# Patient Record
Sex: Male | Born: 1935 | Race: Black or African American | Hispanic: No | Smoking: Former smoker
Health system: Southern US, Community
[De-identification: ages and names within clinical notes are randomized; demographics above are authoritative.]

## PROBLEM LIST (undated history)

## (undated) DIAGNOSIS — I5042 Chronic combined systolic (congestive) and diastolic (congestive) heart failure: Secondary | ICD-10-CM

## (undated) DIAGNOSIS — Z9289 Personal history of other medical treatment: Secondary | ICD-10-CM

## (undated) DIAGNOSIS — M109 Gout, unspecified: Secondary | ICD-10-CM

## (undated) DIAGNOSIS — F0390 Unspecified dementia without behavioral disturbance: Secondary | ICD-10-CM

## (undated) DIAGNOSIS — D471 Chronic myeloproliferative disease: Secondary | ICD-10-CM

## (undated) DIAGNOSIS — D759 Disease of blood and blood-forming organs, unspecified: Secondary | ICD-10-CM

## (undated) DIAGNOSIS — J449 Chronic obstructive pulmonary disease, unspecified: Secondary | ICD-10-CM

## (undated) DIAGNOSIS — E039 Hypothyroidism, unspecified: Secondary | ICD-10-CM

## (undated) DIAGNOSIS — I4892 Unspecified atrial flutter: Secondary | ICD-10-CM

## (undated) DIAGNOSIS — I639 Cerebral infarction, unspecified: Secondary | ICD-10-CM

## (undated) DIAGNOSIS — N400 Enlarged prostate without lower urinary tract symptoms: Secondary | ICD-10-CM

## (undated) DIAGNOSIS — D649 Anemia, unspecified: Secondary | ICD-10-CM

## (undated) DIAGNOSIS — Z9989 Dependence on other enabling machines and devices: Secondary | ICD-10-CM

## (undated) DIAGNOSIS — I10 Essential (primary) hypertension: Secondary | ICD-10-CM

## (undated) DIAGNOSIS — M1990 Unspecified osteoarthritis, unspecified site: Secondary | ICD-10-CM

## (undated) DIAGNOSIS — E119 Type 2 diabetes mellitus without complications: Secondary | ICD-10-CM

## (undated) HISTORY — DX: Gout, unspecified: M10.9

## (undated) HISTORY — PX: THYROID SURGERY: SHX805

## (undated) HISTORY — PX: APPENDECTOMY: SHX54

---

## 2001-09-01 ENCOUNTER — Ambulatory Visit (HOSPITAL_COMMUNITY): Admission: RE | Admit: 2001-09-01 | Discharge: 2001-09-01 | Payer: Self-pay | Admitting: Internal Medicine

## 2001-09-01 ENCOUNTER — Encounter: Payer: Self-pay | Admitting: Internal Medicine

## 2004-11-29 ENCOUNTER — Observation Stay (HOSPITAL_COMMUNITY): Admission: EM | Admit: 2004-11-29 | Discharge: 2004-12-01 | Payer: Self-pay | Admitting: Emergency Medicine

## 2006-10-28 ENCOUNTER — Ambulatory Visit (HOSPITAL_COMMUNITY): Admission: RE | Admit: 2006-10-28 | Discharge: 2006-10-28 | Payer: Self-pay | Admitting: Internal Medicine

## 2006-12-24 ENCOUNTER — Encounter: Admission: RE | Admit: 2006-12-24 | Discharge: 2006-12-24 | Payer: Self-pay | Admitting: General Surgery

## 2006-12-27 ENCOUNTER — Ambulatory Visit (HOSPITAL_BASED_OUTPATIENT_CLINIC_OR_DEPARTMENT_OTHER): Admission: RE | Admit: 2006-12-27 | Discharge: 2006-12-27 | Payer: Self-pay | Admitting: General Surgery

## 2007-11-20 HISTORY — PX: CYST REMOVAL NECK: SHX6281

## 2012-02-01 ENCOUNTER — Telehealth: Payer: Self-pay | Admitting: Oncology

## 2012-02-01 NOTE — Telephone Encounter (Signed)
S/w pt re appt for 3/20 @ 1:30 pm. Pt given appt d/t/location/phone#.

## 2012-02-04 ENCOUNTER — Telehealth: Payer: Self-pay | Admitting: Oncology

## 2012-02-04 NOTE — Telephone Encounter (Signed)
Referred by Dr. Margaretmary Bayley Dx- Dec WBC, Anemia

## 2012-02-05 ENCOUNTER — Other Ambulatory Visit: Payer: Self-pay | Admitting: Oncology

## 2012-02-05 DIAGNOSIS — D649 Anemia, unspecified: Secondary | ICD-10-CM

## 2012-02-06 ENCOUNTER — Other Ambulatory Visit (HOSPITAL_BASED_OUTPATIENT_CLINIC_OR_DEPARTMENT_OTHER): Payer: Medicare Other

## 2012-02-06 ENCOUNTER — Telehealth: Payer: Self-pay | Admitting: Oncology

## 2012-02-06 ENCOUNTER — Ambulatory Visit (HOSPITAL_BASED_OUTPATIENT_CLINIC_OR_DEPARTMENT_OTHER): Payer: Medicare Other | Admitting: Oncology

## 2012-02-06 ENCOUNTER — Ambulatory Visit: Payer: Self-pay

## 2012-02-06 ENCOUNTER — Encounter (HOSPITAL_COMMUNITY)
Admission: RE | Admit: 2012-02-06 | Discharge: 2012-02-06 | Disposition: A | Discharge status: Home / Self Care | Payer: Medicare Other | Source: Ambulatory Visit | Attending: Oncology | Admitting: Oncology

## 2012-02-06 ENCOUNTER — Ambulatory Visit: Payer: Medicare Other

## 2012-02-06 ENCOUNTER — Encounter: Payer: Self-pay | Admitting: Oncology

## 2012-02-06 VITALS — BP 114/60 | HR 98 | Temp 98.0°F | Ht 67.5 in | Wt 186.1 lb

## 2012-02-06 DIAGNOSIS — D649 Anemia, unspecified: Secondary | ICD-10-CM | POA: Insufficient documentation

## 2012-02-06 DIAGNOSIS — D709 Neutropenia, unspecified: Secondary | ICD-10-CM

## 2012-02-06 LAB — MANUAL DIFFERENTIAL
Basophil: 1 % (ref 0–2)
EOS: 0 % (ref 0–7)
PLT EST: ADEQUATE
Variant Lymph: 0 % (ref 0–0)
nRBC: 2 % — ABNORMAL HIGH (ref 0–0)

## 2012-02-06 LAB — COMPREHENSIVE METABOLIC PANEL
ALT: <8 U/L (ref 0–53)
AST: 11 U/L (ref 0–37)
Albumin: 3.9 g/dL (ref 3.5–5.2)
BUN: 13 mg/dL (ref 6–23)
CO2: 22 mEq/L (ref 19–32)
Sodium: 138 mEq/L (ref 135–145)
Total Bilirubin: 0.4 mg/dL (ref 0.3–1.2)
Total Protein: 6.4 g/dL (ref 6.0–8.3)

## 2012-02-06 LAB — IRON AND TIBC: %SAT: 31 % (ref 20–55)

## 2012-02-06 LAB — CBC WITH DIFFERENTIAL/PLATELET
HCT: 20.2 % — ABNORMAL LOW (ref 38.4–49.9)
Platelets: 168 10*3/uL (ref 140–400)

## 2012-02-06 NOTE — Telephone Encounter (Signed)
Per carrie in sch they were able to get him on for 3/25 and she was to call the pt,email to   aom

## 2012-02-06 NOTE — Telephone Encounter (Signed)
appts made and printed and note to dr fs that bm bx may not be until 3/29,advised the pt they would be called by rad. With the appt info    aom

## 2012-02-06 NOTE — Progress Notes (Signed)
Note dictated

## 2012-02-06 NOTE — Progress Notes (Signed)
Patient came in today as a new patient,they have [employer] Medicare insurance,so I did offer him financial assistance his daughter said she would fill it out and return it to me.

## 2012-02-06 NOTE — Progress Notes (Signed)
CC:   Margaretmary Bayley, M.D.  REASON FOR CONSULTATION:  Anemia.  HISTORY OF PRESENT ILLNESS:  Kyle Heath is a pleasant gentleman with past medical history significant for hypertension and hypothyroidism, mild diabetes, but otherwise in relatively good health and shape.  He gets his routine medical care with Dr. Margaretmary Bayley.  He has been doing relatively well.  He has noticed some slight fatigue in recent months and on January 02, 2012 he had a CBC which showed a white cell count was low as 2.2, his hemoglobin was 7.6, and his platelet count was 146. He had iron studies at that time that showed that the iron level was 100 and iron saturation was 56.  His B12 level was 178.  His folate was 9.4. His TSH was 5.7.  A Repeat CBC a month later after he was treated with B12 injection showed his hemoglobin down at 7.2, white cell count of 2.1, and platelet count of 208.  His differential was abnormal with immature cells possibly at 5%.  His absolute neutrophil count was 900. He had nucleated red cells noted on the smear.  He also had normal chemistries including calcium and vitamin B12 and folate were within normal range.  He did have a PSA high at 10.8.  For that reason, the patient referred to me for evaluation.  Upon interviewing Kyle Heath, he is relatively asymptomatic.  He does report some fatigue.  He did report about a 10-pound weight loss.  He is not reporting any back pain.  He is not reporting any shoulder pains.  He is not reporting any hip pain.  He had not had any decline in his performance status.  He does not have any urinary symptoms.  He does not report any hematuria or any dysuria.  His appetite has been reasonable but, as mentioned, he did have about a 10- pound weight loss.  REVIEW OF SYSTEMS:  He does not report any headaches, blurry vision, double vision.  He does not report any motor or sensory neuropathy.  He does not report any alteration in mental status.  He does not  report any psychiatric issues, depression.  He does not report any fever, chills, sweats.  He does not report any cough, hemoptysis, hematemesis.  No nausea, vomiting.  He does not report any abdominal pain.  No hematochezia, melena.  No genitourinary complaints.  Rest of review of systems unremarkable.  PAST MEDICAL HISTORY:  Significant for hypertension, hypothyroidism, and diabetes.  He does not have any history of coronary artery disease.  He does not have any history of any liver disease or any malignancies.  MEDICATION:  He is on amlodipine, aspirin, Synthroid, metformin with linagliptin combination or Jentadueto 2.03/999.  He is on losartan.  ALLERGIES:  None.  SOCIAL HISTORY:  He is single.  He lives with his daughter.  He denied any alcohol abuse.  He smokes cigars and chews tobacco and has done that for many years.  FAMILY HISTORY:  Really unremarkable for any blood disorder.  His father died from a complication of cancer although it is unclear what kind.  PHYSICAL EXAMINATION:  General:  Alert, awake gentleman, appeared really in no active distress.  Vital signs:  His blood pressure 114/60, pulse 98, respirations 20, temperature is 98.  HEENT:  Head is normocephalic, atraumatic.  Pupils equal, round, reactive to light.  Oral mucosa moist and pink.  Neck:  Supple without adenopathy.  Heart:  Regular rate and rhythm.  S1, S2.  Lungs:  Clear auscultation without rhonchi, wheeze, dullness to percussion.  Abdomen:  Soft, nontender.  No hepatosplenomegaly.  Extremities:  No clubbing, cyanosis, or edema. Neurological:  Intact motor, sensory and deep tendon reflexes.  LABORATORY DATA:  Showed a hemoglobin of 6.1, white cell count of 2.0, platelet count 168.  Peripheral smear really failed to show any immature or blastic-looking cells.  There are very few enlarged hypo-lobated neutrophils.  The platelets appear adequate, slightly increased in size. Red cells were decreased in  number and there was abundant polychromasia, nucleated red cells, as well as tear drop cells.  ASSESSMENT AND PLAN:  This is a pleasant gentleman with rather profound anemia and neutropenia with differential showing a few immature cells, possibly blasts, many teardrop cells and ovalocytes suspicious of an infiltrative bone marrow process.  The differential diagnosis today discussed with Kyle Heath includes a primary bone marrow disorder such as leukemia, myelodysplastic syndrome, lymphoproliferative process involving the bone marrow, as well as a possible myeloproliferative disorder such as myelofibrosis or a hairy cell leukemia are definitely a distinct possibility but also he had a slightly elevated PSA.  He could have a carcinoma such as prostate carcinoma metastatic into the bone marrow that could have a similar picture.  Clearly Kyle Heath will need extensive evaluation.  I think given the nature of his grossly abnormal peripheral smear, I  will proceed with a bone marrow biopsy.  Risks and benefits were discussed today in detail including complications of bleeding and pain as well as possible infection.  Given Kyle Heath size and body habitus, prefer to be done under CT guidance.  We will set that up for [facility] as soon as possible.  I would like specimen cell sent for flow cytometry and cytogenetics to rule out MDS as well as leukemia.  Other possibilities on the differential would be vitamin deficiency.  He has been worked up extensively for vitamin B12 and folate deficiency and I think it is less likely.  His MCV is normal. Again he has been adequately treated for it as well.  In terms of management of his anemia, I think for the immediate future I will set him up for 2 units of packed red cell transfusion to prevent any symptomatic progression of his anemia,  progression of shortness of breath or chest pain.  I warned him about that, so we will set that up here in  the near future.  In terms of workup for a solid tumor malignancy, I will defer that until I get the results of the bone marrow.  Imaging studies including CT scan, bone scan, will certainly be a possibility if we suspect that this carcinoma rather than a primary bone marrow disorder.    ______________________________ Benjiman Core, M.D. FNS/MEDQ  D:  02/06/2012  T:  02/06/2012  Job:  [id number]

## 2012-02-07 ENCOUNTER — Encounter (HOSPITAL_COMMUNITY): Payer: Self-pay | Admitting: Pharmacy Technician

## 2012-02-08 ENCOUNTER — Other Ambulatory Visit: Payer: Self-pay | Admitting: Radiology

## 2012-02-09 ENCOUNTER — Ambulatory Visit (HOSPITAL_BASED_OUTPATIENT_CLINIC_OR_DEPARTMENT_OTHER): Payer: Medicare Other

## 2012-02-09 VITALS — BP 145/61 | HR 74 | Temp 98.0°F | Resp 18

## 2012-02-09 DIAGNOSIS — D649 Anemia, unspecified: Secondary | ICD-10-CM

## 2012-02-09 MED ORDER — SODIUM CHLORIDE 0.9 % IJ SOLN
10.0000 mL | INTRAMUSCULAR | Status: DC | PRN
  Filled 2012-02-09: qty 10

## 2012-02-09 MED ORDER — DIPHENHYDRAMINE HCL 25 MG PO CAPS
25.0000 mg | ORAL_CAPSULE | Freq: Once | ORAL | Status: AC
  Administered 2012-02-09: 25 mg via ORAL

## 2012-02-09 MED ORDER — SODIUM CHLORIDE 0.9 % IV SOLN
250.0000 mL | Freq: Once | INTRAVENOUS | Status: AC
  Administered 2012-02-09: 250 mL via INTRAVENOUS

## 2012-02-09 MED ORDER — ACETAMINOPHEN 325 MG PO TABS
650.0000 mg | ORAL_TABLET | Freq: Once | ORAL | Status: AC
  Administered 2012-02-09: 650 mg via ORAL

## 2012-02-09 MED ORDER — HEPARIN SOD (PORK) LOCK FLUSH 100 UNIT/ML IV SOLN
250.0000 [IU] | INTRAVENOUS | Status: DC | PRN
  Filled 2012-02-09: qty 5

## 2012-02-09 MED ORDER — HEPARIN SOD (PORK) LOCK FLUSH 100 UNIT/ML IV SOLN
500.0000 [IU] | INTRAVENOUS | Status: DC | PRN
  Filled 2012-02-09: qty 5

## 2012-02-09 NOTE — Patient Instructions (Signed)
Blood Transfusion Information  WHAT IS A BLOOD TRANSFUSION?  A transfusion is the replacement of blood or some of its parts. Blood is made up of multiple cells which provide different functions.   Red blood cells carry oxygen and are used for blood loss replacement.   White blood cells fight against infection.   Platelets control bleeding.   Plasma helps clot blood.   Other blood products are available for specialized needs, such as hemophilia or other clotting disorders.  BEFORE THE TRANSFUSION   Who gives blood for transfusions?    You may be able to donate blood to be used at a later date on yourself (autologous donation).   Relatives can be asked to donate blood. This is generally not any safer than if you have received blood from a stranger. The same precautions are taken to ensure safety when a relative's blood is donated.   Healthy volunteers who are fully evaluated to make sure their blood is safe. This is blood bank blood.  Transfusion therapy is the safest it has ever been in the practice of medicine. Before blood is taken from a donor, a complete history is taken to make sure that person has no history of diseases nor engages in risky social behavior (examples are intravenous drug use or sexual activity with multiple partners). The donor's travel history is screened to minimize risk of transmitting infections, such as malaria. The donated blood is tested for signs of infectious diseases, such as HIV and hepatitis. The blood is then tested to be sure it is compatible with you in order to minimize the chance of a transfusion reaction. If you or a relative donates blood, this is often done in anticipation of surgery and is not appropriate for emergency situations. It takes many days to process the donated blood.  RISKS AND COMPLICATIONS  Although transfusion therapy is very safe and saves many lives, the main dangers of transfusion include:    Getting an infectious disease.   Developing a  transfusion reaction. This is an allergic reaction to something in the blood you were given. Every precaution is taken to prevent this.  The decision to have a blood transfusion has been considered carefully by your caregiver before blood is given. Blood is not given unless the benefits outweigh the risks.  AFTER THE TRANSFUSION   Right after receiving a blood transfusion, you will usually feel much better and more energetic. This is especially true if your red blood cells have gotten low (anemic). The transfusion raises the level of the red blood cells which carry oxygen, and this usually causes an energy increase.   The nurse administering the transfusion will monitor you carefully for complications.  HOME CARE INSTRUCTIONS   No special instructions are needed after a transfusion. You may find your energy is better. Speak with your caregiver about any limitations on activity for underlying diseases you may have.  SEEK MEDICAL CARE IF:    Your condition is not improving after your transfusion.   You develop redness or irritation at the intravenous (IV) site.  SEEK IMMEDIATE MEDICAL CARE IF:   Any of the following symptoms occur over the next 12 hours:   Shaking chills.   You have a temperature by mouth above 102 F (38.9 C), not controlled by medicine.   Chest, back, or muscle pain.   People around you feel you are not acting correctly or are confused.   Shortness of breath or difficulty breathing.   Dizziness and fainting.     in urine output.   Your urine turns a dark color or changes to pink, red, or brown.  Any of the following symptoms occur over the next 10 days:  You have a temperature by mouth above 102 F (38.9 C), not controlled by medicine.   Shortness of breath.   Weakness after normal activity.   The white part of the eye turns yellow (jaundice).   You have a decrease in the amount of urine or are urinating less  often.   Your urine turns a dark color or changes to pink, red, or brown.  Document Released: 11/02/2000 Document Revised: 10/25/2011 Document Reviewed: 06/21/2008 [facility] Patient Information 2012 [place], [place].    Patient given a thermometer.  Instructed to go to ER if fever shaking and chills, otherwise take tylenol.  Call [phone] for any emergencies after hours and on weekends.  Discharged at 1354 to home, ambulatory with daughter, Grey Rakestraw, his girlfriend and three other family members who've taken turns visiting him in the infusion area today.  Kimberlyann Hollar [place]rui[place] RN

## 2012-02-09 NOTE — Progress Notes (Signed)
Patient arrived for blood transfusion.  Wearing blue blood bank bracelet.  Called blood bank and two units packed cells are ready for pick up.  Called on call physician for orders.  Pt. being followed for anemia.  Hgb = 6.1 on 02-06-12.  Orders received and read back from Dr. Gaylyn Rong to transfuse two units.

## 2012-02-10 LAB — TYPE AND SCREEN
Unit division: 0
Unit division: 0

## 2012-02-11 ENCOUNTER — Ambulatory Visit (HOSPITAL_COMMUNITY)
Admission: RE | Admit: 2012-02-11 | Discharge: 2012-02-11 | Disposition: A | Discharge status: Home / Self Care | Payer: Medicare Other | Source: Ambulatory Visit | Attending: Oncology | Admitting: Oncology

## 2012-02-11 ENCOUNTER — Other Ambulatory Visit: Payer: Self-pay | Admitting: Radiology

## 2012-02-11 ENCOUNTER — Encounter (HOSPITAL_COMMUNITY): Payer: Self-pay

## 2012-02-11 DIAGNOSIS — Z79899 Other long term (current) drug therapy: Secondary | ICD-10-CM | POA: Insufficient documentation

## 2012-02-11 DIAGNOSIS — E119 Type 2 diabetes mellitus without complications: Secondary | ICD-10-CM | POA: Insufficient documentation

## 2012-02-11 DIAGNOSIS — Z7982 Long term (current) use of aspirin: Secondary | ICD-10-CM | POA: Insufficient documentation

## 2012-02-11 DIAGNOSIS — D649 Anemia, unspecified: Secondary | ICD-10-CM | POA: Insufficient documentation

## 2012-02-11 DIAGNOSIS — E039 Hypothyroidism, unspecified: Secondary | ICD-10-CM | POA: Insufficient documentation

## 2012-02-11 DIAGNOSIS — I10 Essential (primary) hypertension: Secondary | ICD-10-CM | POA: Insufficient documentation

## 2012-02-11 DIAGNOSIS — D709 Neutropenia, unspecified: Secondary | ICD-10-CM | POA: Insufficient documentation

## 2012-02-11 HISTORY — DX: Hypothyroidism, unspecified: E03.9

## 2012-02-11 HISTORY — DX: Anemia, unspecified: D64.9

## 2012-02-11 LAB — CBC
HCT: 25.5 % — ABNORMAL LOW (ref 39.0–52.0)
MCH: 29 pg (ref 26.0–34.0)
MCHC: 32.2 g/dL (ref 30.0–36.0)
MCV: 90.1 fL (ref 78.0–100.0)
RDW: 20.7 % — ABNORMAL HIGH (ref 11.5–15.5)

## 2012-02-11 MED ORDER — MIDAZOLAM HCL 2 MG/2ML IJ SOLN
INTRAMUSCULAR | Status: AC
  Filled 2012-02-11: qty 2

## 2012-02-11 MED ORDER — MIDAZOLAM HCL 5 MG/5ML IJ SOLN
INTRAMUSCULAR | Status: AC | PRN
  Administered 2012-02-11 (×2): 1 mg via INTRAVENOUS

## 2012-02-11 MED ORDER — FENTANYL CITRATE 0.05 MG/ML IJ SOLN
INTRAMUSCULAR | Status: AC
  Filled 2012-02-11: qty 6

## 2012-02-11 MED ORDER — SODIUM CHLORIDE 0.9 % IV SOLN
Freq: Once | INTRAVENOUS | Status: AC
  Administered 2012-02-11: 500 mL via INTRAVENOUS

## 2012-02-11 MED ORDER — FENTANYL CITRATE 0.05 MG/ML IJ SOLN
INTRAMUSCULAR | Status: AC | PRN
  Administered 2012-02-11 (×2): 50 ug via INTRAVENOUS

## 2012-02-11 MED ORDER — MIDAZOLAM HCL 2 MG/2ML IJ SOLN
INTRAMUSCULAR | Status: AC
  Filled 2012-02-11: qty 4

## 2012-02-11 NOTE — Discharge Instructions (Signed)
Biopsy A biopsy is a procedure in which small samples of tissue are removed from the body. The tissue is examined under a microscope. A biopsy may be done to determine the cause (diagnosis) of a condition or mass (tumor). A biopsy may also be done to determine the best treatment for you. In some instances, a biopsy may be performed on normal tissue to determine if cancer has spread or if a transplanted organ is being rejected. There are 2 ways to obtain samples:  Fine needle biopsy. Samples are removed using a thin needle inserted through the skin.   Open biopsy. Samples are removed after a cut (incision) is made through the skin.  LET YOUR CAREGIVER KNOW ABOUT:  Allergies to food or medicine.   Medicines taken, including vitamins, herbs, eyedrops, over-the-counter medicines, and creams.   Use of steroids (by mouth or creams).   Previous problems with anesthetics or numbing medicines.   History of bleeding problems or blood clots.   Previous surgery.   Other health problems, including diabetes and kidney problems.   Possibility of pregnancy, if this applies.  RISKS AND COMPLICATIONS  Bleeding from the biopsy site. The risk of bleeding is higher if you have a bleeding disorder or are taking any blood thinning medicines (anticoagulants).   Infection.   Injury to organs or structures near the biopsy site.   Chronic pain at the biopsy site. This is defined as pain that lasts for more than 3 months.   Very rarely, a second biopsy may be required if not enough tissue was collected during the first biopsy.  BEFORE THE PROCEDURE Ask your caregiver what time you need to arrive for your procedure. Ask your caregiver whether you need to stop eating or drinking (fast) before your procedure. Ask your caregiver about changing or stopping your regular medicines. A blood sample may be done to determine your blood clotting time. Medicine may be given to help you relax (sedative). PROCEDURE During  a fine needle biopsy, you will be awake during the procedure. You will be positioned to allow the best possible access to the biopsy site. Let your caregiver know if the position is not comfortable. The biopsy site will be cleaned. A needle is inserted through your skin. You may feel mild discomfort during this procedure. The needle is withdrawn once tissue samples have been removed. Pressure may be applied to the biopsy site to reduce swelling and to ensure that bleeding has stopped. The samples will be sent to be examined. During an open biopsy, you may be given medicine that numbs the area (local anesthetic) or medicine that makes you sleep (general anesthetic). An incision is made through the skin. A tissue sample or the entire mass is removed. The sample or mass will be sent to be examined. Sometimes, the sample or mass may be examined during the procedure. If the sample or mass contains cancer cells, further tissue or structures may be removed. The incision is then closed with stitches (sutures) or skin glue (adhesive). AFTER THE PROCEDURE Your recovery will be assessed and monitored. If there are no problems, you should be able to go home shortly after the procedure (outpatient). You will need to arrange for someone to drive you home if you received a sedative or pain relieving medicine during the procedure. Ask when your test results will be ready. Make sure you get your test results. Document Released: 11/02/2000 Document Revised: 10/25/2011 Document Reviewed: 05/03/2011 [employer] Patient Information 2012 [employer]. 

## 2012-02-11 NOTE — H&P (Signed)
Agree.  For CT guided bone marrow biopsy today.  

## 2012-02-11 NOTE — Procedures (Signed)
Procedure:  CT guided bone marrow biopsy Findings:  Right iliac BM aspirate and core biopsy via 11 G needle.

## 2012-02-11 NOTE — H&P (Signed)
Kyle Heath is an 78 y.o. male.   Chief Complaint: Anemia, neutropenia  HPI: Fatigue, no bone pain.  Seen by Dr. Clelia Croft in regards to hematological work up for diagnosis. Request has been made for bone marrow needle core biopsy. See his note below :   Benjiman Core, MD Physician Signed  Progress Notes 02/06/2012 4:20 PM  Related encounter: Office Visit from 02/06/2012 in [facility]  CC:   Margaretmary Bayley, M.D.   REASON FOR CONSULTATION:  Anemia.   HISTORY OF PRESENT ILLNESS:  Kyle Heath is a pleasant gentleman with past medical history significant for hypertension and hypothyroidism, mild diabetes, but otherwise in relatively good health and shape.  He gets his routine medical care with Dr. Margaretmary Bayley.  He has been doing relatively well.  He has noticed some slight fatigue in recent months and on January 02, 2012 he had a CBC which showed a white cell count was low as 2.2, his hemoglobin was 7.6, and his platelet count was 146. He had iron studies at that time that showed that the iron level was 100 and iron saturation was 56.  His B12 level was 178.  His folate was 9.4. His TSH was 5.7.  A Repeat CBC a month later after he was treated with B12 injection showed his hemoglobin down at 7.2, white cell count of 2.1, and platelet count of 208.  His differential was abnormal with immature cells possibly at 5%.  His absolute neutrophil count was 900. He had nucleated red cells noted on the smear.  He also had normal chemistries including calcium and vitamin B12 and folate were within normal range.  He did have a PSA high at 10.8.  For that reason, the patient referred to me for evaluation.  Upon interviewing Kyle Heath, he is relatively asymptomatic.  He does report some fatigue.  He did report about a 10-pound weight loss.  He is not reporting any back pain.  He is not reporting any shoulder pains.  He is not reporting any hip pain.  He had not had any decline  in his performance status.  He does not have any urinary symptoms.  He does not report any hematuria or any dysuria.  His appetite has been reasonable but, as mentioned, he did have about a 10- pound weight loss.   REVIEW OF SYSTEMS:  He does not report any headaches, blurry vision, double vision.  He does not report any motor or sensory neuropathy.  He does not report any alteration in mental status.  He does not report any psychiatric issues, depression.  He does not report any fever, chills, sweats.  He does not report any cough, hemoptysis, hematemesis.  No nausea, vomiting.  He does not report any abdominal pain.  No hematochezia, melena.  No genitourinary complaints.  Rest of review of systems unremarkable.   PAST MEDICAL HISTORY:  Significant for hypertension, hypothyroidism, and diabetes.  He does not have any history of coronary artery disease.  He does not have any history of any liver disease or any malignancies.   MEDICATION:  He is on amlodipine, aspirin, Synthroid, metformin with linagliptin combination or Jentadueto 2.03/999.  He is on losartan.   ALLERGIES:  None.   SOCIAL HISTORY:  He is single.  He lives with his daughter.  He denied any alcohol abuse.  He smokes cigars and chews tobacco and has done that for many years.   FAMILY HISTORY:  Really unremarkable for any  blood disorder.  His father died from a complication of cancer although it is unclear what kind.   PHYSICAL EXAMINATION:  General:  Alert, awake gentleman, appeared really in no active distress.  Vital signs:  His blood pressure 114/60, pulse 98, respirations 20, temperature is 98.  HEENT:  Head is normocephalic, atraumatic.  Pupils equal, round, reactive to light.  Oral mucosa moist and pink.  Neck:  Supple without adenopathy.  Heart:  Regular rate and rhythm.  S1, S2.  Lungs:  Clear auscultation without rhonchi, wheeze, dullness to percussion.  Abdomen:  Soft, nontender.  No hepatosplenomegaly.   Extremities:  No clubbing, cyanosis, or edema. Neurological:  Intact motor, sensory and deep tendon reflexes.   LABORATORY DATA:  Showed a hemoglobin of 6.1, white cell count of 2.0, platelet count 168.  Peripheral smear really failed to show any immature or blastic-looking cells.  There are very few enlarged hypo-lobated neutrophils.  The platelets appear adequate, slightly increased in size. Red cells were decreased in number and there was abundant polychromasia, nucleated red cells, as well as tear drop cells.   ASSESSMENT AND PLAN:  This is a pleasant gentleman with rather profound anemia and neutropenia with differential showing a few immature cells, possibly blasts, many teardrop cells and ovalocytes suspicious of an infiltrative bone marrow process.  The differential diagnosis today discussed with Mr. Fennell includes a primary bone marrow disorder such as leukemia, myelodysplastic syndrome, lymphoproliferative process involving the bone marrow, as well as a possible myeloproliferative disorder such as myelofibrosis or a hairy cell leukemia are definitely a distinct possibility but also he had a slightly elevated PSA.  He could have a carcinoma such as prostate carcinoma metastatic into the bone marrow that could have a similar picture.  Clearly [name] will need extensive evaluation.  I think given the nature of his grossly abnormal peripheral smear, I  will proceed with a bone marrow biopsy.  Risks and benefits were discussed today in detail including complications of bleeding and pain as well as possible infection.  Given Mr. Cleary size and body habitus, prefer to be done under CT guidance.  We will set that up for [facility] Radiology as soon as possible.  I would like specimen cell sent for flow cytometry and cytogenetics to rule out MDS as well as leukemia.  Other possibilities on the differential would be vitamin deficiency.  He has been worked up extensively for vitamin  B12 and folate deficiency and I think it is less likely.  His MCV is normal. Again he has been adequately treated for it as well.  In terms of management of his anemia, I think for the immediate future I will set him up for 2 units of packed red cell transfusion to prevent any symptomatic progression of his anemia,  progression of shortness of breath or chest pain.  I warned him about that, so we will set that up here in the near future.  In terms of workup for a solid tumor malignancy, I will defer that until I get the results of the bone marrow.  Imaging studies including CT scan, bone scan, will certainly be a possibility if we suspect that this carcinoma rather than a primary bone marrow disorder.       ______________________________ Benjiman Core, M.D. FNS/MEDQ  D:  02/06/2012  T:  02/06/2012  Job:  [id number]  Last signed by: Benjiman Core, MD    [02/07/2012 7:56 AM]       02/11/12 evaluation  for bone marrow needle core biopsy :  Past Medical History  Diagnosis Date  . Diabetes mellitus   . Anemia   . Hypothyroidism    Social History:  does not have a smoking history on file. He does not have any smokeless tobacco history on file. His alcohol and drug histories not on file.  Allergies: No Known Allergies  Medications Prior to Admission  Medication Sig Dispense Refill  . amLODipine (NORVASC) 10 MG tablet Take 5 mg by mouth daily after breakfast.       . aspirin 81 MG tablet Take 81 mg by mouth daily after breakfast.       . levothyroxine (SYNTHROID, LEVOTHROID) 75 MCG tablet Take 75 mcg by mouth daily before breakfast.       . Linagliptin-Metformin HCl (JENTADUETO) 2.03-999 MG TABS Take 1 tablet by mouth daily after breakfast.       . losartan (COZAAR) 100 MG tablet Take 100 mg by mouth daily after breakfast.       . naproxen sodium (ANAPROX) 220 MG tablet Take 220 mg by mouth 2 (two) times daily as needed. Pain       Medications Prior to Admission  Medication Dose  Route Frequency Provider Last Rate Last Dose  . 0.9 %  sodium chloride infusion   Intravenous Once Brayton El, PA        Results for orders placed in visit on 02/09/12 (from the past 48 hour(s))  PREPARE RBC (CROSSMATCH)     Status: Normal   Collection Time   02/09/12  2:35 PM      Component Value Range Comment   Order Confirmation ORDER PROCESSED BY BLOOD BANK       Review of Systems  Constitutional: Positive for weight loss and malaise/fatigue. Negative for fever and chills.  Respiratory: Negative.  Negative for shortness of breath.   Cardiovascular: Negative.   Gastrointestinal: Negative.   Musculoskeletal: Negative.   Skin: Negative.   Neurological: Negative.   Psychiatric/Behavioral: Negative.     Physical Exam  Constitutional: He is oriented to person, place, and time. He appears well-developed and well-nourished. No distress.  HENT:  Head: Normocephalic and atraumatic.  Neck: Normal range of motion.  Cardiovascular: Normal rate.  Exam reveals friction rub. Exam reveals no gallop.   No murmur heard.      Few PVC's   Respiratory: Effort normal and breath sounds normal. He has no wheezes. He has no rales.  GI: Soft. Bowel sounds are normal.  Neurological: He is alert and oriented to person, place, and time.  Skin: Skin is warm and dry.  Psychiatric: He has a normal mood and affect. His behavior is normal. Judgment and thought content normal.     Assessment/Plan Patient presents today for bone marrow needle core biopsy to assist with diagnosis for anemia and neutropenia.  Procedure details, risks and benefits have been discussed with patient and family with all their questions answered to their satisfaction.  Written consent obtained. Plan to proceed with biopsy once lab results from this am are available.   Taura Lamarre D 02/11/2012, 8:29 AM

## 2012-02-15 ENCOUNTER — Telehealth: Payer: Self-pay | Admitting: Oncology

## 2012-02-15 ENCOUNTER — Ambulatory Visit (HOSPITAL_BASED_OUTPATIENT_CLINIC_OR_DEPARTMENT_OTHER): Payer: Medicare Other | Admitting: Oncology

## 2012-02-15 ENCOUNTER — Encounter: Payer: Self-pay | Admitting: *Deleted

## 2012-02-15 VITALS — BP 128/59 | HR 85 | Temp 97.5°F | Ht 67.5 in | Wt 185.3 lb

## 2012-02-15 DIAGNOSIS — D696 Thrombocytopenia, unspecified: Secondary | ICD-10-CM

## 2012-02-15 DIAGNOSIS — D47Z9 Other specified neoplasms of uncertain behavior of lymphoid, hematopoietic and related tissue: Secondary | ICD-10-CM

## 2012-02-15 DIAGNOSIS — D649 Anemia, unspecified: Secondary | ICD-10-CM

## 2012-02-15 DIAGNOSIS — D709 Neutropenia, unspecified: Secondary | ICD-10-CM

## 2012-02-15 MED ORDER — DARBEPOETIN ALFA-POLYSORBATE 300 MCG/0.6ML IJ SOLN
300.0000 ug | Freq: Once | INTRAMUSCULAR | Status: AC
  Administered 2012-02-15: 300 ug via SUBCUTANEOUS
  Filled 2012-02-15: qty 0.6

## 2012-02-15 NOTE — Progress Notes (Signed)
Hematology and Oncology Follow Up Visit  Halton Neas [id number] 1927/06/10 78 y.o. 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 He he might have an element of myelodysplastic syndrome as well  Current therapy:  He is to start Aranesp therapy at 300 mcg every 3 weeks  Interim History:  Mr. Isaacson presents today for a followup visit. He is a pleasant 78 year old gentleman who I saw for the first time on 02/06/2012. At that time he had presented with a hemoglobin of 6.1 white cell count of 2.0. His peripheral smear indicated an infiltrative bone marrow process. He did receive 2 units of blood transfusion on March 23 of 2013. He did have a bone marrow biopsy performed on 02/11/2012. There is also were discussed with Dr. Laureen Ochs  The reviewing pathologists and his bone marrow showed an extensive fibrosis indicating a myeloproliferative disorder more specifically myelofibrosis. There were however increased blasts of about 9%. Clinically Mr. Ludlum feeling a lot better after his transfusion and otherwise asymptomatic. Is not reporting any chest pain or shortness of breath. Is not reporting any difficulty with ambulation or syncope.  Medications: I have reviewed the patient's current medications. Current outpatient prescriptions:amLODipine (NORVASC) 10 MG tablet, Take 5 mg by mouth daily after breakfast. , Disp: , Rfl: ;  aspirin 81 MG tablet, Take 81 mg by mouth daily after breakfast. , Disp: , Rfl: ;  levothyroxine (SYNTHROID, LEVOTHROID) 75 MCG tablet, Take 75 mcg by mouth daily before breakfast. , Disp: , Rfl: ;  Linagliptin-Metformin HCl (JENTADUETO) 2.03-999 MG TABS, Take 1 tablet by mouth daily after breakfast. , Disp: , Rfl:  losartan (COZAAR) 100 MG tablet, Take 100 mg by mouth daily after breakfast. , Disp: , Rfl: ;  naproxen sodium (ANAPROX) 220 MG tablet, Take 220 mg by mouth 2 (two) times  daily as needed. Pain, Disp: , Rfl:  Current facility-administered medications:darbepoetin (ARANESP) injection 300 mcg, 300 mcg, Subcutaneous, Once, Benjiman Core, MD  Allergies: No Known Allergies  Past Medical History, Surgical history, Social history, and Family History were reviewed and updated.  Review of Systems: Constitutional:  Negative for fever, chills, night sweats, anorexia, weight loss, pain. Cardiovascular: no chest pain or dyspnea on exertion Respiratory: negative Neurological: negative Dermatological: negative ENT: negative Skin: Negative. Gastrointestinal: negative Genito-Urinary: negative Hematological and Lymphatic: negative Breast: negative Musculoskeletal: negative Remaining ROS negative. Physical Exam: Blood pressure 128/59, pulse 85, temperature 97.5 F (36.4 C), temperature source Oral, height 5' 7.5" (1.715 m), weight 185 lb 4.8 oz (84.052 kg). ECOG: 1 General appearance: alert Head: Normocephalic, without obvious abnormality, atraumatic Neck: no adenopathy, no carotid bruit, no JVD, supple, symmetrical, trachea midline and thyroid not enlarged, symmetric, no tenderness/mass/nodules Lymph nodes: Cervical, supraclavicular, and axillary nodes normal. Heart:regular rate and rhythm, S1, S2 normal, no murmur, click, rub or gallop Lung:chest clear, no wheezing, rales, normal symmetric air entry Abdomin: soft, non-tender, without masses or organomegaly EXT:no erythema, induration, or nodules   Lab Results: Lab Results  Component Value Date   WBC 1.7* 02/11/2012   HGB 8.2* 02/11/2012   HCT 25.5* 02/11/2012   MCV 90.1 02/11/2012   PLT 129* 02/11/2012     Chemistry      Component Value Date/Time   NA 138 02/06/2012 1341   K 4.2 02/06/2012 1341   CL 104 02/06/2012 1341   CO2 22 02/06/2012 1341   BUN 13 02/06/2012 1341   CREATININE 1.15 02/06/2012  1341      Component Value Date/Time   CALCIUM 8.4 02/06/2012 1341   ALKPHOS 86 02/06/2012 1341   AST 11 02/06/2012  1341   ALT <8 02/06/2012 1341   BILITOT 0.4 02/06/2012 1341       Impression and Plan:  78 year old gentleman with the following issues:  1. 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 He could also have an element of myelodysplastic syndrome as well. He certainly did not have any evidence of carcinoma in the bone. The natural course of this disease was discussed today in detail with Mr. Lariccia and his daughter. Given Mr. Pernell age and performance status, supportive care only would be my recommendations. He is asymptomatic from it abdominal pain or a large spleen standpoint. I talked about growth factor support in detail today including the risks and benefits of using Aranesp therapy. Complications that includes hypertension injection related toxicities as well as other toxicities. A medication died was given to Mr. Kamara and his family today. He was agreeable to proceed and we'll schedule him Aranesp 300 mcg every 3 weeks starting today.  2. Neutropenia: At this point does not have any recurrent sinopulmonary infections I will withhold any growth factor support for the time being.  3. Thrombocytopenia is rather mild and asymptomatic at this point.  4. Followup: Will be in 9 weeks to assess response.   Eli Hose, MD 3/29/20133:12 PM

## 2012-02-15 NOTE — Telephone Encounter (Signed)
Gv pt appt for april-may2013 

## 2012-03-07 ENCOUNTER — Other Ambulatory Visit: Payer: Self-pay | Admitting: Oncology

## 2012-03-07 ENCOUNTER — Ambulatory Visit: Payer: Medicare Other | Admitting: Lab

## 2012-03-07 ENCOUNTER — Ambulatory Visit (HOSPITAL_BASED_OUTPATIENT_CLINIC_OR_DEPARTMENT_OTHER): Payer: Medicare Other

## 2012-03-07 ENCOUNTER — Other Ambulatory Visit (HOSPITAL_BASED_OUTPATIENT_CLINIC_OR_DEPARTMENT_OTHER): Payer: Medicare Other | Admitting: Lab

## 2012-03-07 ENCOUNTER — Ambulatory Visit: Payer: Medicare Other

## 2012-03-07 ENCOUNTER — Encounter (HOSPITAL_COMMUNITY)
Admission: RE | Admit: 2012-03-07 | Discharge: 2012-03-07 | Disposition: A | Discharge status: Home / Self Care | Payer: Medicare Other | Source: Ambulatory Visit | Attending: Oncology | Admitting: Oncology

## 2012-03-07 VITALS — BP 126/61 | HR 96 | Temp 97.6°F

## 2012-03-07 DIAGNOSIS — D649 Anemia, unspecified: Secondary | ICD-10-CM

## 2012-03-07 DIAGNOSIS — D7581 Myelofibrosis: Secondary | ICD-10-CM | POA: Insufficient documentation

## 2012-03-07 LAB — MANUAL DIFFERENTIAL
ALC: 0.5 10*3/uL — ABNORMAL LOW (ref 0.9–3.3)
ANC (CHCC manual diff): 1.1 10*3/uL — ABNORMAL LOW (ref 1.5–6.5)
Band Neutrophils: 0 % (ref 0–10)
Blasts: 8 % — ABNORMAL HIGH (ref 0–0)
Other Cell: 0 % (ref 0–0)
PROMYELO: 0 % (ref 0–0)
SEG: 62 % (ref 38–77)
nRBC: 5 % — ABNORMAL HIGH (ref 0–0)

## 2012-03-07 LAB — CBC WITH DIFFERENTIAL/PLATELET
HGB: 6 g/dL — CL (ref 13.0–17.1)
RDW: 22.8 % — ABNORMAL HIGH (ref 11.0–14.6)
WBC: 1.8 10*3/uL — ABNORMAL LOW (ref 4.0–10.3)

## 2012-03-07 MED ORDER — DARBEPOETIN ALFA-POLYSORBATE 300 MCG/0.6ML IJ SOLN
300.0000 ug | Freq: Once | INTRAMUSCULAR | Status: AC
  Administered 2012-03-07: 300 ug via SUBCUTANEOUS
  Filled 2012-03-07: qty 0.6

## 2012-03-08 ENCOUNTER — Ambulatory Visit (HOSPITAL_BASED_OUTPATIENT_CLINIC_OR_DEPARTMENT_OTHER): Payer: Medicare Other

## 2012-03-08 VITALS — BP 140/55 | HR 75 | Temp 97.1°F | Resp 18

## 2012-03-08 DIAGNOSIS — D649 Anemia, unspecified: Secondary | ICD-10-CM

## 2012-03-08 MED ORDER — ACETAMINOPHEN 325 MG PO TABS
650.0000 mg | ORAL_TABLET | Freq: Once | ORAL | Status: AC
  Administered 2012-03-08: 650 mg via ORAL

## 2012-03-08 MED ORDER — SODIUM CHLORIDE 0.9 % IV SOLN
250.0000 mL | Freq: Once | INTRAVENOUS | Status: AC
  Administered 2012-03-08: 250 mL via INTRAVENOUS

## 2012-03-08 MED ORDER — DIPHENHYDRAMINE HCL 25 MG PO CAPS
25.0000 mg | ORAL_CAPSULE | Freq: Once | ORAL | Status: AC
  Administered 2012-03-08: 25 mg via ORAL

## 2012-03-08 NOTE — Patient Instructions (Signed)
Blood Transfusion Information  WHAT IS A BLOOD TRANSFUSION?  A transfusion is the replacement of blood or some of its parts. Blood is made up of multiple cells which provide different functions.   Red blood cells carry oxygen and are used for blood loss replacement.   White blood cells fight against infection.   Platelets control bleeding.   Plasma helps clot blood.   Other blood products are available for specialized needs, such as hemophilia or other clotting disorders.  BEFORE THE TRANSFUSION   Who gives blood for transfusions?    You may be able to donate blood to be used at a later date on yourself (autologous donation).   Relatives can be asked to donate blood. This is generally not any safer than if you have received blood from a stranger. The same precautions are taken to ensure safety when a relative's blood is donated.   Healthy volunteers who are fully evaluated to make sure their blood is safe. This is blood bank blood.  Transfusion therapy is the safest it has ever been in the practice of medicine. Before blood is taken from a donor, a complete history is taken to make sure that person has no history of diseases nor engages in risky social behavior (examples are intravenous drug use or sexual activity with multiple partners). The donor's travel history is screened to minimize risk of transmitting infections, such as malaria. The donated blood is tested for signs of infectious diseases, such as HIV and hepatitis. The blood is then tested to be sure it is compatible with you in order to minimize the chance of a transfusion reaction. If you or a relative donates blood, this is often done in anticipation of surgery and is not appropriate for emergency situations. It takes many days to process the donated blood.  RISKS AND COMPLICATIONS  Although transfusion therapy is very safe and saves many lives, the main dangers of transfusion include:    Getting an infectious disease.   Developing a  transfusion reaction. This is an allergic reaction to something in the blood you were given. Every precaution is taken to prevent this.  The decision to have a blood transfusion has been considered carefully by your caregiver before blood is given. Blood is not given unless the benefits outweigh the risks.  AFTER THE TRANSFUSION   Right after receiving a blood transfusion, you will usually feel much better and more energetic. This is especially true if your red blood cells have gotten low (anemic). The transfusion raises the level of the red blood cells which carry oxygen, and this usually causes an energy increase.   The nurse administering the transfusion will monitor you carefully for complications.  HOME CARE INSTRUCTIONS   No special instructions are needed after a transfusion. You may find your energy is better. Speak with your caregiver about any limitations on activity for underlying diseases you may have.  SEEK MEDICAL CARE IF:    Your condition is not improving after your transfusion.   You develop redness or irritation at the intravenous (IV) site.  SEEK IMMEDIATE MEDICAL CARE IF:   Any of the following symptoms occur over the next 12 hours:   Shaking chills.   You have a temperature by mouth above 102 F (38.9 C), not controlled by medicine.   Chest, back, or muscle pain.   People around you feel you are not acting correctly or are confused.   Shortness of breath or difficulty breathing.   Dizziness and fainting.     You get a rash or develop hives.   You have a decrease in urine output.   Your urine turns a dark color or changes to pink, red, or brown.  Any of the following symptoms occur over the next 10 days:   You have a temperature by mouth above 102 F (38.9 C), not controlled by medicine.   Shortness of breath.   Weakness after normal activity.   The white part of the eye turns yellow (jaundice).   You have a decrease in the amount of urine or are urinating less often.   Your  urine turns a dark color or changes to pink, red, or brown.  Document Released: 11/02/2000 Document Revised: 10/25/2011 Document Reviewed: 06/21/2008  [employer] Patient Information 2012 [employer].

## 2012-03-11 LAB — TYPE AND SCREEN
ABO/RH(D): O POS
Antibody Screen: NEGATIVE
Unit division: 0
Unit division: 0

## 2012-03-28 ENCOUNTER — Other Ambulatory Visit (HOSPITAL_BASED_OUTPATIENT_CLINIC_OR_DEPARTMENT_OTHER): Payer: Medicare Other | Admitting: Lab

## 2012-03-28 ENCOUNTER — Encounter (HOSPITAL_COMMUNITY)
Admission: RE | Admit: 2012-03-28 | Discharge: 2012-03-28 | Disposition: A | Discharge status: Home / Self Care | Payer: Medicare Other | Source: Ambulatory Visit | Attending: Oncology | Admitting: Oncology

## 2012-03-28 ENCOUNTER — Ambulatory Visit (HOSPITAL_BASED_OUTPATIENT_CLINIC_OR_DEPARTMENT_OTHER): Payer: Medicare Other

## 2012-03-28 ENCOUNTER — Other Ambulatory Visit: Payer: Self-pay | Admitting: Oncology

## 2012-03-28 VITALS — BP 132/63 | HR 87 | Temp 97.9°F

## 2012-03-28 DIAGNOSIS — D649 Anemia, unspecified: Secondary | ICD-10-CM

## 2012-03-28 DIAGNOSIS — D709 Neutropenia, unspecified: Secondary | ICD-10-CM

## 2012-03-28 LAB — CBC WITH DIFFERENTIAL/PLATELET
Basophils Absolute: 0 10*3/uL (ref 0.0–0.1)
EOS%: 0.9 % (ref 0.0–7.0)
Eosinophils Absolute: 0 10*3/uL (ref 0.0–0.5)
HCT: 20.1 % — ABNORMAL LOW (ref 38.4–49.9)
HGB: 6.4 g/dL — CL (ref 13.0–17.1)
LYMPH%: 43.1 % (ref 14.0–49.0)
MCH: 28.8 pg (ref 27.2–33.4)
MCV: 90 fL (ref 79.3–98.0)
MONO%: 12.7 % (ref 0.0–14.0)
NEUT%: 41.8 % (ref 39.0–75.0)
Platelets: 185 10*3/uL (ref 140–400)
RDW: 23.1 % — ABNORMAL HIGH (ref 11.0–14.6)

## 2012-03-28 LAB — TYPE & CROSSMATCH - CHCC

## 2012-03-28 MED ORDER — DARBEPOETIN ALFA-POLYSORBATE 300 MCG/0.6ML IJ SOLN
300.0000 ug | Freq: Once | INTRAMUSCULAR | Status: AC
  Administered 2012-03-28: 300 ug via SUBCUTANEOUS
  Filled 2012-03-28: qty 0.6

## 2012-03-28 NOTE — Progress Notes (Signed)
Hemoglobin is 6.4 today. Received Aranesp injection as scheduled. Mildly symptomatic with dyspnea and daughter wants him transfused. Set up for 2 units PRBCs on 03/29/12 @ 0800. Orders entered.

## 2012-03-29 ENCOUNTER — Ambulatory Visit (HOSPITAL_BASED_OUTPATIENT_CLINIC_OR_DEPARTMENT_OTHER): Payer: Medicare Other

## 2012-03-29 VITALS — BP 131/59 | HR 74 | Temp 97.1°F | Resp 18

## 2012-03-29 DIAGNOSIS — D649 Anemia, unspecified: Secondary | ICD-10-CM

## 2012-03-29 LAB — PREPARE RBC (CROSSMATCH)

## 2012-03-29 MED ORDER — SODIUM CHLORIDE 0.9 % IV SOLN
250.0000 mL | Freq: Once | INTRAVENOUS | Status: AC
  Administered 2012-03-29: 250 mL via INTRAVENOUS

## 2012-03-29 MED ORDER — DIPHENHYDRAMINE HCL 25 MG PO CAPS
25.0000 mg | ORAL_CAPSULE | Freq: Once | ORAL | Status: AC
  Administered 2012-03-29: 25 mg via ORAL

## 2012-03-29 MED ORDER — SODIUM CHLORIDE 0.9 % IJ SOLN
3.0000 mL | INTRAMUSCULAR | Status: DC | PRN
  Filled 2012-03-29: qty 10

## 2012-03-29 MED ORDER — ACETAMINOPHEN 325 MG PO TABS
650.0000 mg | ORAL_TABLET | Freq: Once | ORAL | Status: AC
  Administered 2012-03-29: 650 mg via ORAL

## 2012-03-30 LAB — TYPE AND SCREEN
ABO/RH(D): O POS
Unit division: 0

## 2012-04-10 ENCOUNTER — Encounter: Payer: Self-pay | Admitting: Oncology

## 2012-04-10 NOTE — Progress Notes (Signed)
Patient approve for 100% Discount 04/10/12 - 04/11/13.

## 2012-04-18 ENCOUNTER — Ambulatory Visit: Payer: Medicare Other

## 2012-04-18 ENCOUNTER — Encounter: Payer: Self-pay | Admitting: Oncology

## 2012-04-18 ENCOUNTER — Other Ambulatory Visit (HOSPITAL_BASED_OUTPATIENT_CLINIC_OR_DEPARTMENT_OTHER): Payer: Medicare Other | Admitting: Lab

## 2012-04-18 ENCOUNTER — Ambulatory Visit (HOSPITAL_BASED_OUTPATIENT_CLINIC_OR_DEPARTMENT_OTHER): Payer: Medicare Other | Admitting: Oncology

## 2012-04-18 ENCOUNTER — Telehealth: Payer: Self-pay | Admitting: Oncology

## 2012-04-18 VITALS — BP 152/62 | HR 90 | Temp 97.1°F | Ht 67.5 in | Wt 184.7 lb

## 2012-04-18 DIAGNOSIS — D649 Anemia, unspecified: Secondary | ICD-10-CM

## 2012-04-18 DIAGNOSIS — D709 Neutropenia, unspecified: Secondary | ICD-10-CM

## 2012-04-18 DIAGNOSIS — D61818 Other pancytopenia: Secondary | ICD-10-CM

## 2012-04-18 LAB — CBC WITH DIFFERENTIAL/PLATELET
BASO%: 3.7 % — ABNORMAL HIGH (ref 0.0–2.0)
Basophils Absolute: 0.1 10*3/uL (ref 0.0–0.1)
Eosinophils Absolute: 0 10*3/uL (ref 0.0–0.5)
HCT: 21.2 % — ABNORMAL LOW (ref 38.4–49.9)
HGB: 6.9 g/dL — CL (ref 13.0–17.1)
MONO#: 0.2 10*3/uL (ref 0.1–0.9)
NEUT#: 0.5 10*3/uL — ABNORMAL LOW (ref 1.5–6.5)
NEUT%: 36.8 % — ABNORMAL LOW (ref 39.0–75.0)
WBC: 1.4 10*3/uL — ABNORMAL LOW (ref 4.0–10.3)
lymph#: 0.6 10*3/uL — ABNORMAL LOW (ref 0.9–3.3)

## 2012-04-18 MED ORDER — DARBEPOETIN ALFA-POLYSORBATE 300 MCG/0.6ML IJ SOLN
300.0000 ug | Freq: Once | INTRAMUSCULAR | Status: AC
  Administered 2012-04-18: 300 ug via SUBCUTANEOUS
  Filled 2012-04-18: qty 0.6

## 2012-04-18 NOTE — Progress Notes (Signed)
Injection given to patient during MD appointment

## 2012-04-18 NOTE — Telephone Encounter (Signed)
Gave pt appt for June  And July 2013 , lab, PRBC, and MD, sent pt to lab today for type and cross.

## 2012-04-18 NOTE — Progress Notes (Signed)
Hematology and Oncology Follow Up Visit  Kyle Heath [id number] [birth year] 78 y.o. 04/18/2012 12:44 PM    Principle Diagnosis: 78 year old gentleman with diagnosis of pancytopenia and more specifically severe anemia likely due to myelofibrosis. He he might have an element of myelodysplastic syndrome as well  Current therapy:  Aranesp therapy at 300 mcg every 3 weeks started on 02/15/12  Interim History:  Kyle Heath presents today for a followup visit with is daughter today. He is a pleasant 89 year old gentleman seen for the first time on 02/06/2012. At that time he had presented with a hemoglobin of 6.1 white cell count of 2.0. His peripheral smear indicated an infiltrative bone marrow process. He did receive 2 units of blood transfusion on March 23 of 2013 and again on  In Apri and early May 2013. He did have a bone marrow biopsy performed on 02/11/2012. The reviewing pathologists and his bone marrow showed an extensive fibrosis indicating a myeloproliferative disorder more specifically myelofibrosis. There were however increased blasts of about 9%. Clinically Mr. Hillhouse feels well with mild fatigue. Fatigue is not interfering with performance status. He has mild dyspnea on exertion. Denies chest pain or shortness of breath. Is not reporting any difficulty with ambulation or syncope.  Medications: I have reviewed the patient's current medications. Current outpatient prescriptions:amLODipine (NORVASC) 10 MG tablet, Take 5 mg by mouth daily after breakfast. , Disp: , Rfl: ;  aspirin 81 MG tablet, Take 81 mg by mouth daily after breakfast. , Disp: , Rfl: ;  levothyroxine (SYNTHROID, LEVOTHROID) 75 MCG tablet, Take 75 mcg by mouth daily before breakfast. , Disp: , Rfl: ;  Linagliptin-Metformin HCl (JENTADUETO) 2.03-999 MG TABS, Take 1 tablet by mouth daily after breakfast. , Disp: , Rfl:  losartan (COZAAR) 100 MG tablet, Take 100 mg by mouth daily after breakfast. , Disp: , Rfl: ;  naproxen sodium (ANAPROX)  220 MG tablet, Take 220 mg by mouth 2 (two) times daily as needed. Pain, Disp: , Rfl:  Current facility-administered medications:darbepoetin (ARANESP) injection 300 mcg, 300 mcg, Subcutaneous, Once, Myrtis Ser, NP, 300 mcg at 04/18/12 1206  Allergies: No Known Allergies  Past Medical History, Surgical history, Social history, and Family History were reviewed and updated.  Review of Systems: Constitutional:  Negative for fever, chills, night sweats, anorexia, weight loss, pain. Cardiovascular: no chest pain or dyspnea on exertion Respiratory: negative Neurological: negative Dermatological: negative ENT: negative Skin: Negative. Gastrointestinal: negative Genito-Urinary: negative Hematological and Lymphatic: negative Breast: negative Musculoskeletal: negative Remaining ROS negative.  Physical Exam: Blood pressure 152/62, pulse 90, temperature 97.1 F (36.2 C), temperature source Oral, height 5' 7.5" (1.715 m), weight 184 lb 11.2 oz (83.779 kg). ECOG: 1 General appearance: alert Head: Normocephalic, without obvious abnormality, atraumatic Neck: no adenopathy, no carotid bruit, no JVD, supple, symmetrical, trachea midline and thyroid not enlarged, symmetric, no tenderness/mass/nodules Lymph nodes: Cervical, supraclavicular, and axillary nodes normal. Heart:regular rate and rhythm, S1, S2 normal, no murmur, click, rub or gallop Lung:chest clear, no wheezing, rales, normal symmetric air entry Abdomen: soft, non-tender, without masses or organomegaly EXT:no erythema, induration, or nodules  Lab Results: Lab Results  Component Value Date   WBC 1.4* 04/18/2012   HGB 6.9* 04/18/2012   HCT 21.2* 04/18/2012   MCV 85.6 04/18/2012   PLT 222 04/18/2012     Chemistry      Component Value Date/Time   NA 138 02/06/2012 1341   K 4.2 02/06/2012 1341   CL 104 02/06/2012 1341   CO2 22 02/06/2012  1341   BUN 13 02/06/2012 1341   CREATININE 1.15 02/06/2012 1341      Component Value Date/Time     CALCIUM 8.4 02/06/2012 1341   ALKPHOS 86 02/06/2012 1341   AST 11 02/06/2012 1341   ALT <8 02/06/2012 1341   BILITOT 0.4 02/06/2012 1341     Impression and Plan:  78 year old gentleman with the following issues:  1. 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 He could also have an element of myelodysplastic syndrome as well. He certainly did not have any evidence of carcinoma in the bone. He is currently on supportive care only. He is asymptomatic from it abdominal pain or a large spleen standpoint. He received his Aranesp injection today. Plan to check CBC every 3 weeks and administer Aranesp if Hemoglobin is less than 11.0. He is symptomatic from his anemia and will set him up for 2 units PRBCs on 04/19/12.  2. Neutropenia: At this point does not have any recurrent sinopulmonary infections I will withhold any growth factor support for the time being.  3. Thrombocytopenia : Resolved  4. HTN: On Norvasc and he will follow-up with PCP for HTN.  5. DM: On Jentadueto. He will follow-up with PCP.  6. Followup: In 3 weeks for lab and injection and he will be seen for a visit in 6 weeks.   Clenton Pare 5/31/201312:44 PM

## 2012-04-19 ENCOUNTER — Ambulatory Visit (HOSPITAL_BASED_OUTPATIENT_CLINIC_OR_DEPARTMENT_OTHER): Payer: Medicare Other

## 2012-04-19 ENCOUNTER — Encounter (HOSPITAL_COMMUNITY)
Admission: RE | Admit: 2012-04-19 | Discharge: 2012-04-19 | Disposition: A | Discharge status: Home / Self Care | Payer: Medicare Other | Source: Ambulatory Visit | Attending: Oncology | Admitting: Oncology

## 2012-04-19 VITALS — BP 125/70 | HR 110 | Temp 97.8°F | Resp 24

## 2012-04-19 DIAGNOSIS — D649 Anemia, unspecified: Secondary | ICD-10-CM

## 2012-04-19 MED ORDER — ACETAMINOPHEN 325 MG PO TABS
650.0000 mg | ORAL_TABLET | Freq: Once | ORAL | Status: AC
  Administered 2012-04-19: 650 mg via ORAL

## 2012-04-19 MED ORDER — DIPHENHYDRAMINE HCL 25 MG PO CAPS
25.0000 mg | ORAL_CAPSULE | Freq: Once | ORAL | Status: AC
  Administered 2012-04-19: 25 mg via ORAL

## 2012-04-19 MED ORDER — SODIUM CHLORIDE 0.9 % IV SOLN
250.0000 mL | Freq: Once | INTRAVENOUS | Status: AC
  Administered 2012-04-19: 250 mL via INTRAVENOUS

## 2012-04-19 NOTE — Patient Instructions (Signed)
Blood Transfusion Information  WHAT IS A BLOOD TRANSFUSION?  A transfusion is the replacement of blood or some of its parts. Blood is made up of multiple cells which provide different functions.   Red blood cells carry oxygen and are used for blood loss replacement.   White blood cells fight against infection.   Platelets control bleeding.   Plasma helps clot blood.   Other blood products are available for specialized needs, such as hemophilia or other clotting disorders.  BEFORE THE TRANSFUSION   Who gives blood for transfusions?    You may be able to donate blood to be used at a later date on yourself (autologous donation).   Relatives can be asked to donate blood. This is generally not any safer than if you have received blood from a stranger. The same precautions are taken to ensure safety when a relative's blood is donated.   Healthy volunteers who are fully evaluated to make sure their blood is safe. This is blood bank blood.  Transfusion therapy is the safest it has ever been in the practice of medicine. Before blood is taken from a donor, a complete history is taken to make sure that person has no history of diseases nor engages in risky social behavior (examples are intravenous drug use or sexual activity with multiple partners). The donor's travel history is screened to minimize risk of transmitting infections, such as malaria. The donated blood is tested for signs of infectious diseases, such as HIV and hepatitis. The blood is then tested to be sure it is compatible with you in order to minimize the chance of a transfusion reaction. If you or a relative donates blood, this is often done in anticipation of surgery and is not appropriate for emergency situations. It takes many days to process the donated blood.  RISKS AND COMPLICATIONS  Although transfusion therapy is very safe and saves many lives, the main dangers of transfusion include:    Getting an infectious disease.   Developing a  transfusion reaction. This is an allergic reaction to something in the blood you were given. Every precaution is taken to prevent this.  The decision to have a blood transfusion has been considered carefully by your caregiver before blood is given. Blood is not given unless the benefits outweigh the risks.  AFTER THE TRANSFUSION   Right after receiving a blood transfusion, you will usually feel much better and more energetic. This is especially true if your red blood cells have gotten low (anemic). The transfusion raises the level of the red blood cells which carry oxygen, and this usually causes an energy increase.   The nurse administering the transfusion will monitor you carefully for complications.  HOME CARE INSTRUCTIONS   No special instructions are needed after a transfusion. You may find your energy is better. Speak with your caregiver about any limitations on activity for underlying diseases you may have.  SEEK MEDICAL CARE IF:    Your condition is not improving after your transfusion.   You develop redness or irritation at the intravenous (IV) site.  SEEK IMMEDIATE MEDICAL CARE IF:   Any of the following symptoms occur over the next 12 hours:   Shaking chills.   You have a temperature by mouth above 102 F (38.9 C), not controlled by medicine.   Chest, back, or muscle pain.   People around you feel you are not acting correctly or are confused.   Shortness of breath or difficulty breathing.   Dizziness and fainting.     You get a rash or develop hives.   You have a decrease in urine output.   Your urine turns a dark color or changes to pink, red, or brown.  Any of the following symptoms occur over the next 10 days:   You have a temperature by mouth above 102 F (38.9 C), not controlled by medicine.   Shortness of breath.   Weakness after normal activity.   The white part of the eye turns yellow (jaundice).   You have a decrease in the amount of urine or are urinating less often.   Your  urine turns a dark color or changes to pink, red, or brown.  Document Released: 11/02/2000 Document Revised: 10/25/2011 Document Reviewed: 06/21/2008  [employer] Patient Information 2012 [employer].

## 2012-04-19 NOTE — Progress Notes (Signed)
1125 -  VS post first unit of blood    Temp  98.1 ,  P  145,  R  20,   BP   115/70.    Pt had voided on command post blood transfusion.   Pt denied any problems.   Stated "  I feel fine ".   Dr.  Donnie Coffin notified.  Order received to proceed with second unit of blood as ordered.   Will continue to monitor pt closely.

## 2012-04-20 LAB — TYPE AND SCREEN: Unit division: 0

## 2012-05-09 ENCOUNTER — Ambulatory Visit (HOSPITAL_BASED_OUTPATIENT_CLINIC_OR_DEPARTMENT_OTHER): Payer: Medicare Other

## 2012-05-09 ENCOUNTER — Ambulatory Visit: Payer: Medicare Other

## 2012-05-09 ENCOUNTER — Other Ambulatory Visit: Payer: Self-pay | Admitting: Lab

## 2012-05-09 ENCOUNTER — Telehealth: Payer: Self-pay | Admitting: Oncology

## 2012-05-09 ENCOUNTER — Other Ambulatory Visit: Payer: Self-pay | Admitting: Oncology

## 2012-05-09 ENCOUNTER — Other Ambulatory Visit (HOSPITAL_BASED_OUTPATIENT_CLINIC_OR_DEPARTMENT_OTHER): Payer: Medicare Other | Admitting: Lab

## 2012-05-09 VITALS — BP 122/64 | HR 89 | Temp 97.1°F

## 2012-05-09 DIAGNOSIS — D649 Anemia, unspecified: Secondary | ICD-10-CM

## 2012-05-09 LAB — CBC WITH DIFFERENTIAL/PLATELET
BASO%: 1.9 % (ref 0.0–2.0)
Eosinophils Absolute: 0 10*3/uL (ref 0.0–0.5)
LYMPH%: 46.7 % (ref 14.0–49.0)
MCHC: 32.6 g/dL (ref 32.0–36.0)
MONO#: 0.1 10*3/uL (ref 0.1–0.9)
NEUT#: 0.9 10*3/uL — ABNORMAL LOW (ref 1.5–6.5)
Platelets: 262 10*3/uL (ref 140–400)
RBC: 2.36 10*6/uL — ABNORMAL LOW (ref 4.20–5.82)
RDW: 24.5 % — ABNORMAL HIGH (ref 11.0–14.6)
WBC: 2.1 10*3/uL — ABNORMAL LOW (ref 4.0–10.3)
lymph#: 1 10*3/uL (ref 0.9–3.3)

## 2012-05-09 MED ORDER — DARBEPOETIN ALFA-POLYSORBATE 300 MCG/0.6ML IJ SOLN
300.0000 ug | Freq: Once | INTRAMUSCULAR | Status: AC
  Administered 2012-05-09: 300 ug via SUBCUTANEOUS
  Filled 2012-05-09: qty 0.6

## 2012-05-09 NOTE — Telephone Encounter (Signed)
Sent pt to labs for type and cross today per ML

## 2012-05-10 ENCOUNTER — Ambulatory Visit (HOSPITAL_BASED_OUTPATIENT_CLINIC_OR_DEPARTMENT_OTHER): Payer: Medicare Other

## 2012-05-10 VITALS — BP 127/75 | HR 135 | Temp 97.4°F | Resp 16

## 2012-05-10 DIAGNOSIS — D649 Anemia, unspecified: Secondary | ICD-10-CM

## 2012-05-10 MED ORDER — SODIUM CHLORIDE 0.9 % IJ SOLN
10.0000 mL | INTRAMUSCULAR | Status: DC | PRN
  Filled 2012-05-10: qty 10

## 2012-05-10 MED ORDER — DIPHENHYDRAMINE HCL 25 MG PO CAPS
25.0000 mg | ORAL_CAPSULE | Freq: Once | ORAL | Status: AC
  Administered 2012-05-10: 25 mg via ORAL

## 2012-05-10 MED ORDER — ACETAMINOPHEN 325 MG PO TABS
650.0000 mg | ORAL_TABLET | Freq: Once | ORAL | Status: AC
  Administered 2012-05-10: 650 mg via ORAL

## 2012-05-10 MED ORDER — SODIUM CHLORIDE 0.9 % IV SOLN: 250.0000 mL | Freq: Once | INTRAVENOUS | Status: DC

## 2012-05-10 NOTE — Patient Instructions (Signed)
Blood Transfusion Information  WHAT IS A BLOOD TRANSFUSION?  A transfusion is the replacement of blood or some of its parts. Blood is made up of multiple cells which provide different functions.   Red blood cells carry oxygen and are used for blood loss replacement.   White blood cells fight against infection.   Platelets control bleeding.   Plasma helps clot blood.   Other blood products are available for specialized needs, such as hemophilia or other clotting disorders.  BEFORE THE TRANSFUSION   Who gives blood for transfusions?    You may be able to donate blood to be used at a later date on yourself (autologous donation).   Relatives can be asked to donate blood. This is generally not any safer than if you have received blood from a stranger. The same precautions are taken to ensure safety when a relative's blood is donated.   Healthy volunteers who are fully evaluated to make sure their blood is safe. This is blood bank blood.  Transfusion therapy is the safest it has ever been in the practice of medicine. Before blood is taken from a donor, a complete history is taken to make sure that person has no history of diseases nor engages in risky social behavior (examples are intravenous drug use or sexual activity with multiple partners). The donor's travel history is screened to minimize risk of transmitting infections, such as malaria. The donated blood is tested for signs of infectious diseases, such as HIV and hepatitis. The blood is then tested to be sure it is compatible with you in order to minimize the chance of a transfusion reaction. If you or a relative donates blood, this is often done in anticipation of surgery and is not appropriate for emergency situations. It takes many days to process the donated blood.  RISKS AND COMPLICATIONS  Although transfusion therapy is very safe and saves many lives, the main dangers of transfusion include:    Getting an infectious disease.   Developing a  transfusion reaction. This is an allergic reaction to something in the blood you were given. Every precaution is taken to prevent this.  The decision to have a blood transfusion has been considered carefully by your caregiver before blood is given. Blood is not given unless the benefits outweigh the risks.  AFTER THE TRANSFUSION   Right after receiving a blood transfusion, you will usually feel much better and more energetic. This is especially true if your red blood cells have gotten low (anemic). The transfusion raises the level of the red blood cells which carry oxygen, and this usually causes an energy increase.   The nurse administering the transfusion will monitor you carefully for complications.  HOME CARE INSTRUCTIONS   No special instructions are needed after a transfusion. You may find your energy is better. Speak with your caregiver about any limitations on activity for underlying diseases you may have.  SEEK MEDICAL CARE IF:    Your condition is not improving after your transfusion.   You develop redness or irritation at the intravenous (IV) site.  SEEK IMMEDIATE MEDICAL CARE IF:   Any of the following symptoms occur over the next 12 hours:   Shaking chills.   You have a temperature by mouth above 102 F (38.9 C), not controlled by medicine.   Chest, back, or muscle pain.   People around you feel you are not acting correctly or are confused.   Shortness of breath or difficulty breathing.   Dizziness and fainting.     You get a rash or develop hives.   You have a decrease in urine output.   Your urine turns a dark color or changes to pink, red, or brown.  Any of the following symptoms occur over the next 10 days:   You have a temperature by mouth above 102 F (38.9 C), not controlled by medicine.   Shortness of breath.   Weakness after normal activity.   The white part of the eye turns yellow (jaundice).   You have a decrease in the amount of urine or are urinating less often.   Your  urine turns a dark color or changes to pink, red, or brown.  Document Released: 11/02/2000 Document Revised: 10/25/2011 Document Reviewed: 06/21/2008  [employer] Patient Information 2012 [employer].

## 2012-05-11 LAB — TYPE AND SCREEN
Antibody Screen: NEGATIVE
Unit division: 0

## 2012-05-30 ENCOUNTER — Encounter (HOSPITAL_COMMUNITY)
Admission: RE | Admit: 2012-05-30 | Discharge: 2012-05-30 | Disposition: A | Discharge status: Home / Self Care | Payer: Medicare Other | Source: Ambulatory Visit | Attending: Oncology | Admitting: Oncology

## 2012-05-30 ENCOUNTER — Telehealth: Payer: Self-pay | Admitting: *Deleted

## 2012-05-30 ENCOUNTER — Ambulatory Visit (HOSPITAL_BASED_OUTPATIENT_CLINIC_OR_DEPARTMENT_OTHER): Payer: Medicare Other | Admitting: Oncology

## 2012-05-30 ENCOUNTER — Other Ambulatory Visit: Payer: Medicare Other | Admitting: Lab

## 2012-05-30 ENCOUNTER — Ambulatory Visit: Payer: Medicare Other

## 2012-05-30 ENCOUNTER — Telehealth: Payer: Self-pay | Admitting: Oncology

## 2012-05-30 ENCOUNTER — Ambulatory Visit (HOSPITAL_BASED_OUTPATIENT_CLINIC_OR_DEPARTMENT_OTHER): Payer: Medicare Other

## 2012-05-30 VITALS — BP 126/66 | HR 79 | Temp 97.6°F | Ht 67.5 in | Wt 182.2 lb

## 2012-05-30 VITALS — BP 109/64 | HR 133 | Temp 98.1°F | Resp 20

## 2012-05-30 DIAGNOSIS — D649 Anemia, unspecified: Secondary | ICD-10-CM

## 2012-05-30 LAB — CBC WITH DIFFERENTIAL/PLATELET
Basophils Absolute: 0 10*3/uL (ref 0.0–0.1)
Eosinophils Absolute: 0 10*3/uL (ref 0.0–0.5)
HGB: 6 g/dL — CL (ref 13.0–17.1)
MONO#: 0.3 10*3/uL (ref 0.1–0.9)
NEUT#: 2.1 10*3/uL (ref 1.5–6.5)
RDW: 24.1 % — ABNORMAL HIGH (ref 11.0–14.6)
WBC: 3.3 10*3/uL — ABNORMAL LOW (ref 4.0–10.3)
lymph#: 0.8 10*3/uL — ABNORMAL LOW (ref 0.9–3.3)
nRBC: 4 % — ABNORMAL HIGH (ref 0–0)

## 2012-05-30 LAB — COMPREHENSIVE METABOLIC PANEL
ALT: 22 U/L (ref 0–53)
Albumin: 3.5 g/dL (ref 3.5–5.2)
Alkaline Phosphatase: 70 U/L (ref 39–117)
CO2: 27 mEq/L (ref 19–32)
Glucose, Bld: 124 mg/dL — ABNORMAL HIGH (ref 70–99)
Potassium: 5.1 mEq/L (ref 3.5–5.3)
Sodium: 137 mEq/L (ref 135–145)
Total Protein: 6.5 g/dL (ref 6.0–8.3)

## 2012-05-30 LAB — HOLD TUBE, BLOOD BANK

## 2012-05-30 MED ORDER — GUAIFENESIN 100 MG/5ML PO LIQD: 200.0000 mg | Freq: Three times a day (TID) | ORAL | Status: AC | PRN

## 2012-05-30 MED ORDER — FUROSEMIDE 10 MG/ML IJ SOLN
20.0000 mg | Freq: Once | INTRAMUSCULAR | Status: AC
  Administered 2012-05-30: 20 mg via INTRAVENOUS

## 2012-05-30 MED ORDER — DIPHENHYDRAMINE HCL 25 MG PO CAPS
25.0000 mg | ORAL_CAPSULE | Freq: Once | ORAL | Status: AC
  Administered 2012-05-30: 25 mg via ORAL

## 2012-05-30 MED ORDER — ACETAMINOPHEN 325 MG PO TABS
650.0000 mg | ORAL_TABLET | Freq: Once | ORAL | Status: AC
  Administered 2012-05-30: 650 mg via ORAL

## 2012-05-30 MED ORDER — SODIUM CHLORIDE 0.9 % IV SOLN
250.0000 mL | Freq: Once | INTRAVENOUS | Status: AC
  Administered 2012-05-30: 250 mL via INTRAVENOUS

## 2012-05-30 MED ORDER — HYDROCODONE-ACETAMINOPHEN 5-500 MG PO CAPS: 1.0000 | ORAL_CAPSULE | Freq: Four times a day (QID) | ORAL | Status: DC | PRN

## 2012-05-30 NOTE — Patient Instructions (Addendum)
Blood Transfusion Information WHAT IS A BLOOD TRANSFUSION? A transfusion is the replacement of blood or some of its parts. Blood is made up of multiple cells which provide different functions.  Red blood cells carry oxygen and are used for blood loss replacement.   White blood cells fight against infection.   Platelets control bleeding.   Plasma helps clot blood.   Other blood products are available for specialized needs, such as hemophilia or other clotting disorders.  BEFORE THE TRANSFUSION  Who gives blood for transfusions?   You may be able to donate blood to be used at a later date on yourself (autologous donation).   Relatives can be asked to donate blood. This is generally not any safer than if you have received blood from a stranger. The same precautions are taken to ensure safety when a relative's blood is donated.   Healthy volunteers who are fully evaluated to make sure their blood is safe. This is blood bank blood.  Transfusion therapy is the safest it has ever been in the practice of medicine. Before blood is taken from a donor, a complete history is taken to make sure that person has no history of diseases nor engages in risky social behavior (examples are intravenous drug use or sexual activity with multiple partners). The donor's travel history is screened to minimize risk of transmitting infections, such as malaria. The donated blood is tested for signs of infectious diseases, such as HIV and hepatitis. The blood is then tested to be sure it is compatible with you in order to minimize the chance of a transfusion reaction. If you or a relative donates blood, this is often done in anticipation of surgery and is not appropriate for emergency situations. It takes many days to process the donated blood. RISKS AND COMPLICATIONS Although transfusion therapy is very safe and saves many lives, the main dangers of transfusion include:   Getting an infectious disease.   Developing a  transfusion reaction. This is an allergic reaction to something in the blood you were given. Every precaution is taken to prevent this.  The decision to have a blood transfusion has been considered carefully by your caregiver before blood is given. Blood is not given unless the benefits outweigh the risks. AFTER THE TRANSFUSION  Right after receiving a blood transfusion, you will usually feel much better and more energetic. This is especially true if your red blood cells have gotten low (anemic). The transfusion raises the level of the red blood cells which carry oxygen, and this usually causes an energy increase.   The nurse administering the transfusion will monitor you carefully for complications.  HOME CARE INSTRUCTIONS  No special instructions are needed after a transfusion. You may find your energy is better. Speak with your caregiver about any limitations on activity for underlying diseases you may have. SEEK MEDICAL CARE IF:   Your condition is not improving after your transfusion.   You develop redness or irritation at the intravenous (IV) site.  SEEK IMMEDIATE MEDICAL CARE IF:  Any of the following symptoms occur over the next 12 hours:  Shaking chills.   You have a temperature by mouth above 102 F (38.9 C), not controlled by medicine.   Chest, back, or muscle pain.   People around you feel you are not acting correctly or are confused.   Shortness of breath or difficulty breathing.   Dizziness and fainting.   You get a rash or develop hives.   You have a decrease   in urine output.   Your urine turns a dark color or changes to pink, red, or brown.  Any of the following symptoms occur over the next 10 days:  You have a temperature by mouth above 102 F (38.9 C), not controlled by medicine.   Shortness of breath.   Weakness after normal activity.   The white part of the eye turns yellow (jaundice).   You have a decrease in the amount of urine or are urinating less  often.   Your urine turns a dark color or changes to pink, red, or brown.  Document Released: 11/02/2000 Document Revised: 10/25/2011 Document Reviewed: 06/21/2008 ExitCare Patient Information 2012 [employer].Blood Products Information This is information about transfusions of blood products. All blood that is to be transfused is tested for blood type, compatibility with the recipient, and for infections. Except in emergencies, giving a transfusion requires a written consent. Blood transfusions are often given as packed red blood cells. This means the other parts of the blood have been taken out. Blood may be needed to treat severe anemia or bleeding. Other blood products include plasma, platelets, immune globulin, and cryoprecipitate. Blood for transfusion is mostly donated by volunteers. The blood donors are carefully screened for risk factors that could cause disease. Donors are all tested for infections that could be transmitted by blood. The blood product supply today is the safest it has ever been. Some risks do remain.  A minor reaction with fever, chills, or rash happens in about 1% of blood product transfusions.   Life-threatening reactions occur in less than 1 in a million transfusions.   Infection with germs (bacteria), viruses or parasites like malaria can still happen. The risk is very low.   Hepatitis B occurs in about 1 case in 150,000 transfusions.   Hepatitis C is seen once in 500,000.   HIV is transmitted less than once every million transfusions.  When you receive a transfusion of packed red blood cells, your blood is tested for blood group and Rh type. Your blood is also screened for antibodies that could cause a serious reaction. A cross-match test is done to make sure the blood is safe to give.  Talk with your caregiver if you have any concerns about receiving a transfusion of blood products. Make sure your questions are answered. Transfusions are not given if your  caregiver feels the risk is greater than the need. Document Released: 11/05/2005 Document Revised: 10/25/2011 Document Reviewed: 04/25/2007 ExitCare Patient Information 2012 [employer]. 

## 2012-05-30 NOTE — Telephone Encounter (Signed)
Pt is aware to pick up his appt calendars when he comes in to the office on 06/13/2012

## 2012-05-30 NOTE — Telephone Encounter (Signed)
Per POF I  Have scheduled appts. [name]

## 2012-05-30 NOTE — Progress Notes (Signed)
Hematology and Oncology Follow Up Visit  Kyle Heath [id number] [birth year] 78 y.o. 05/30/2012 12:19 PM    Principle Diagnosis: 78 year old gentleman with diagnosis of pancytopenia and more specifically severe anemia likely due to myelofibrosis. He he might have an element of myelodysplastic syndrome as well  Current therapy:  Aranesp therapy at 300 mcg every 3 weeks started on 02/15/12 and supportive transfusions.   Interim History:  Kyle Heath presents today for a followup visit with is daughter today. He is a pleasant 34 year old gentleman seen for the first time on 02/06/2012. At that time he had presented with a hemoglobin of 6.1 white cell count of 2.0. His peripheral smear indicated an infiltrative bone marrow process. He did have a bone marrow biopsy performed on 02/11/2012. The reviewing pathologists and his bone marrow showed an extensive fibrosis indicating a myeloproliferative disorder more specifically myelofibrosis. He has been getting PRBCs every 3 weeks.  Clinically Kyle Heath feels poorly at this time. Fatigue is getting worse with a clear decline in his performance status. He has mild dyspnea on exertion. Denies chest pain or shortness of breath. Is not reporting any difficulty with ambulation or syncope. He is reporting diffuse pain and cough that is non-productive.   Medications: I have reviewed the patient's current medications. Current outpatient prescriptions:amLODipine-olmesartan (AZOR) 5-20 MG per tablet, Take 1 tablet by mouth daily., Disp: , Rfl: ;  aspirin 81 MG tablet, Take 81 mg by mouth daily after breakfast. , Disp: , Rfl: ;  febuxostat (ULORIC) 40 MG tablet, Take 40 mg by mouth daily., Disp: , Rfl: ;  levothyroxine (SYNTHROID, LEVOTHROID) 75 MCG tablet, Take 88 mcg by mouth daily before breakfast. , Disp: , Rfl:  Linagliptin-Metformin HCl (JENTADUETO) 2.03-999 MG TABS, Take 1 tablet by mouth daily after breakfast. , Disp: , Rfl: ;  naproxen sodium (ANAPROX) 220 MG tablet,  Take 220 mg by mouth 2 (two) times daily as needed. Pain, Disp: , Rfl: ;  guaiFENesin (ROBITUSSIN) 100 MG/5ML liquid, Take 10 mLs (200 mg total) by mouth 3 (three) times daily as needed for cough., Disp: 120 mL, Rfl: 0 hydrocodone-acetaminophen (LORCET-HD) 5-500 MG per capsule, Take 1 capsule by mouth every 6 (six) hours as needed for pain., Disp: 30 capsule, Rfl: 0  Allergies: No Known Allergies  Past Medical History, Surgical history, Social history, and Family History were reviewed and updated.  Review of Systems: Constitutional:  Negative for fever, chills, night sweats, anorexia, weight loss, pain. Cardiovascular: no chest pain or dyspnea on exertion Respiratory: negative Neurological: negative Dermatological: negative ENT: negative Skin: Negative. Gastrointestinal: negative Genito-Urinary: negative Hematological and Lymphatic: negative Breast: negative Musculoskeletal: negative Remaining ROS negative.  Physical Exam: Blood pressure 126/66, pulse 79, temperature 97.6 F (36.4 C), temperature source Oral, height 5' 7.5" (1.715 m), weight 182 lb 3.2 oz (82.645 kg). ECOG: 2 General appearance: alert Head: Normocephalic, without obvious abnormality, atraumatic Neck: no adenopathy, no carotid bruit, no JVD, supple, symmetrical, trachea midline and thyroid not enlarged, symmetric, no tenderness/mass/nodules Lymph nodes: Cervical, supraclavicular, and axillary nodes normal. Heart:regular rate and rhythm, S1, S2 normal, no murmur, click, rub or gallop Lung:chest clear, no wheezing, rales, normal symmetric air entry Abdomen: soft, non-tender, without masses or organomegaly EXT:no erythema, induration, or nodules. 1+ edema.   Lab Results:  Hgb 6.0  Smear shows 2 blasts  Impression and Plan:  78 year old gentleman with the following issues:  1. 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 He could also have an element of myelodysplastic syndrome  as well. He is currently on supportive care only. He is asymptomatic from it abdominal pain or a large spleen standpoint.  Plan to check CBC every 2 weeks and administer Aranesp if Hemoglobin is less than 11.0 and transfuse when symptomatic from his anemia. We  will set him up for 2 units PRBCs on 05/30/12. Overall, his prognosis is very poor. We already seeing blasts in the bone in the peripheral blood which indicate a possible transformation in the near future. He understands that his prognosis is poor and he is not a candidate for any aggressive treatment. We will continue supportive care for now and soon he will likely need hospice.   2. Neutropenia: At this point does not have any recurrent sinopulmonary infections I will withhold any growth factor support for the time being. His cough is not productive and I have given him Robitussin.   3. Thrombocytopenia : Resolved  4. HTN: On Norvasc and he will follow-up with PCP for HTN.  5. DM: On Jentadueto. He will follow-up with PCP.  6. Followup: In 2 weeks for lab and injection and he will be seen for a visit in 6 weeks.  7. Pain: this is likely due to his disease. I have given Rx for hydrocodone with instructions about the potential side effects.    [facility] 7/12/201312:19 PM

## 2012-06-01 LAB — TYPE AND SCREEN
ABO/RH(D): O POS
Antibody Screen: NEGATIVE
Unit division: 0

## 2012-06-13 ENCOUNTER — Other Ambulatory Visit: Payer: Self-pay | Admitting: *Deleted

## 2012-06-13 ENCOUNTER — Ambulatory Visit (HOSPITAL_BASED_OUTPATIENT_CLINIC_OR_DEPARTMENT_OTHER): Payer: Medicare Other

## 2012-06-13 ENCOUNTER — Other Ambulatory Visit (HOSPITAL_BASED_OUTPATIENT_CLINIC_OR_DEPARTMENT_OTHER): Payer: Medicare Other | Admitting: Lab

## 2012-06-13 VITALS — BP 106/71 | HR 142 | Temp 97.6°F | Resp 20

## 2012-06-13 DIAGNOSIS — D649 Anemia, unspecified: Secondary | ICD-10-CM

## 2012-06-13 LAB — CBC & DIFF AND RETIC
Basophils Absolute: 0.1 10*3/uL (ref 0.0–0.1)
EOS%: 0.5 % (ref 0.0–7.0)
HGB: 6.2 g/dL — CL (ref 13.0–17.1)
MCH: 27.9 pg (ref 27.2–33.4)
NEUT#: 2.5 10*3/uL (ref 1.5–6.5)
RBC: 2.22 10*6/uL — ABNORMAL LOW (ref 4.20–5.82)
RDW: 24.6 % — ABNORMAL HIGH (ref 11.0–14.6)
Retic %: 2.51 % — ABNORMAL HIGH (ref 0.80–1.80)
Retic Ct Abs: 55.72 10*3/uL (ref 34.80–93.90)
lymph#: 0.9 10*3/uL (ref 0.9–3.3)
nRBC: 3 % — ABNORMAL HIGH (ref 0–0)

## 2012-06-13 LAB — HOLD TUBE, BLOOD BANK

## 2012-06-13 MED ORDER — ACETAMINOPHEN 325 MG PO TABS
650.0000 mg | ORAL_TABLET | Freq: Once | ORAL | Status: AC
  Administered 2012-06-13: 650 mg via ORAL

## 2012-06-13 MED ORDER — DIPHENHYDRAMINE HCL 25 MG PO CAPS
25.0000 mg | ORAL_CAPSULE | Freq: Once | ORAL | Status: AC
  Administered 2012-06-13: 25 mg via ORAL

## 2012-06-13 MED ORDER — SODIUM CHLORIDE 0.9 % IV SOLN
250.0000 mL | Freq: Once | INTRAVENOUS | Status: AC
  Administered 2012-06-13: 250 mL via INTRAVENOUS

## 2012-06-13 NOTE — Patient Instructions (Signed)
Blood Transfusion Information AFTER THE TRANSFUSION  Right after receiving a blood transfusion, you will usually feel much better and more energetic. This is especially true if your red blood cells have gotten low (anemic). The transfusion raises the level of the red blood cells which carry oxygen, and this usually causes an energy increase.   The nurse administering the transfusion will monitor you carefully for complications.  HOME CARE INSTRUCTIONS  No special instructions are needed after a transfusion. You may find your energy is better. Speak with your caregiver about any limitations on activity for underlying diseases you may have. SEEK MEDICAL CARE IF:   Your condition is not improving after your transfusion.   You develop redness or irritation at the intravenous (IV) site.  SEEK IMMEDIATE MEDICAL CARE IF:  Any of the following symptoms occur over the next 12 hours:  Shaking chills.   You have a temperature by mouth above 102 F (38.9 C), not controlled by medicine.   Chest, back, or muscle pain.   People around you feel you are not acting correctly or are confused.   Shortness of breath or difficulty breathing.   Dizziness and fainting.   You get a rash or develop hives.   You have a decrease in urine output.   Your urine turns a dark color or changes to pink, red, or brown.  Any of the following symptoms occur over the next 10 days:  You have a temperature by mouth above 102 F (38.9 C), not controlled by medicine.   Shortness of breath.   Weakness after normal activity.   The white part of the eye turns yellow (jaundice).   You have a decrease in the amount of urine or are urinating less often.   Your urine turns a dark color or changes to pink, red, or brown.  Document Released: 11/02/2000 Document Revised: 10/25/2011 Document Reviewed: 06/21/2008 ExitCare Patient Information 2012 [employer]. 

## 2012-06-14 LAB — TYPE AND SCREEN
ABO/RH(D): O POS
Antibody Screen: NEGATIVE
Unit division: 0

## 2012-06-16 ENCOUNTER — Other Ambulatory Visit: Payer: Self-pay | Admitting: *Deleted

## 2012-06-17 ENCOUNTER — Other Ambulatory Visit: Payer: Self-pay | Admitting: Oncology

## 2012-06-17 DIAGNOSIS — D649 Anemia, unspecified: Secondary | ICD-10-CM

## 2012-06-19 ENCOUNTER — Encounter (HOSPITAL_COMMUNITY)
Admission: RE | Admit: 2012-06-19 | Discharge: 2012-06-19 | Disposition: A | Discharge status: Home / Self Care | Payer: Medicare Other | Source: Ambulatory Visit | Attending: Oncology | Admitting: Oncology

## 2012-06-19 DIAGNOSIS — D649 Anemia, unspecified: Secondary | ICD-10-CM | POA: Insufficient documentation

## 2012-06-19 DIAGNOSIS — D47Z9 Other specified neoplasms of uncertain behavior of lymphoid, hematopoietic and related tissue: Secondary | ICD-10-CM | POA: Insufficient documentation

## 2012-06-19 DIAGNOSIS — D471 Chronic myeloproliferative disease: Secondary | ICD-10-CM

## 2012-06-27 ENCOUNTER — Other Ambulatory Visit: Payer: Self-pay | Admitting: *Deleted

## 2012-06-27 ENCOUNTER — Ambulatory Visit (HOSPITAL_BASED_OUTPATIENT_CLINIC_OR_DEPARTMENT_OTHER): Payer: Medicare Other

## 2012-06-27 ENCOUNTER — Other Ambulatory Visit (HOSPITAL_BASED_OUTPATIENT_CLINIC_OR_DEPARTMENT_OTHER): Payer: Medicare Other | Admitting: Lab

## 2012-06-27 VITALS — BP 105/69 | HR 150 | Temp 98.2°F | Resp 18

## 2012-06-27 DIAGNOSIS — D649 Anemia, unspecified: Secondary | ICD-10-CM

## 2012-06-27 LAB — CBC WITH DIFFERENTIAL/PLATELET
BASO%: 3.8 % — ABNORMAL HIGH (ref 0.0–2.0)
HCT: 23.6 % — ABNORMAL LOW (ref 38.4–49.9)
MCHC: 30.9 g/dL — ABNORMAL LOW (ref 32.0–36.0)
MONO#: 0.2 10*3/uL (ref 0.1–0.9)
NEUT%: 63.9 % (ref 39.0–75.0)
RBC: 2.56 10*6/uL — ABNORMAL LOW (ref 4.20–5.82)
RDW: 23.4 % — ABNORMAL HIGH (ref 11.0–14.6)
WBC: 3.9 10*3/uL — ABNORMAL LOW (ref 4.0–10.3)
lymph#: 1.1 10*3/uL (ref 0.9–3.3)
nRBC: 2 % — ABNORMAL HIGH (ref 0–0)

## 2012-06-27 LAB — TECHNOLOGIST REVIEW

## 2012-06-27 LAB — PREPARE RBC (CROSSMATCH)

## 2012-06-27 MED ORDER — DIPHENHYDRAMINE HCL 25 MG PO CAPS
25.0000 mg | ORAL_CAPSULE | Freq: Once | ORAL | Status: AC
  Administered 2012-06-27: 25 mg via ORAL

## 2012-06-27 MED ORDER — HYDROCODONE-ACETAMINOPHEN 5-500 MG PO CAPS: 1.0000 | ORAL_CAPSULE | Freq: Four times a day (QID) | ORAL | Status: DC | PRN

## 2012-06-27 MED ORDER — DARBEPOETIN ALFA-POLYSORBATE 300 MCG/0.6ML IJ SOLN
300.0000 ug | Freq: Once | INTRAMUSCULAR | Status: AC
  Administered 2012-06-27: 300 ug via SUBCUTANEOUS
  Filled 2012-06-27: qty 0.6

## 2012-06-27 MED ORDER — ACETAMINOPHEN 325 MG PO TABS
650.0000 mg | ORAL_TABLET | Freq: Once | ORAL | Status: AC
  Administered 2012-06-27: 650 mg via ORAL

## 2012-06-27 NOTE — Patient Instructions (Signed)
Blood Transfusion   A blood transfusion replaces your blood or some of its parts. Blood is replaced when you have lost blood because of surgery, an accident, or for severe blood conditions like anemia.  You can donate blood to be used on yourself if you have a planned surgery. If you lose blood during that surgery, your own blood can be given back to you.  Any blood given to you is checked to make sure it matches your blood type. Your temperature, blood pressure, and heart rate (vital signs) will be checked often.   GET HELP RIGHT AWAY IF:    You feel sick to your stomach (nauseous) or throw up (vomit).   You have watery poop (diarrhea).   You have shortness of breath or trouble breathing.   You have blood in your pee (urine) or have dark colored pee.   You have chest pain or tightness.   Your eyes or skin turn yellow (jaundice).   You have a temperature by mouth above 102 F (38.9 C), not controlled by medicine.   You start to shake and have chills.   You develop a a red rash (hives) or feel itchy.   You develop lightheadedness or feel confused.   You develop back, joint, or muscle pain.   You do not feel hungry (lost appetite).   You feel tired, restless, or nervous.   You develop belly (abdominal) cramps.  Document Released: 02/01/2009 Document Revised: 10/25/2011 Document Reviewed: 02/01/2009  ExitCare Patient Information 2012 [employer].

## 2012-06-27 NOTE — Progress Notes (Signed)
Pt reports feeling very weak, no energy. Dr Clelia Croft notified- OK to transfuse 2 units.

## 2012-06-27 NOTE — Telephone Encounter (Signed)
Called patient to let him know that his script for lorcet has been called into his pharmacy, since he has already gone home from the cancer center.

## 2012-06-28 LAB — TYPE AND SCREEN
ABO/RH(D): O POS
Antibody Screen: NEGATIVE

## 2012-06-29 ENCOUNTER — Encounter (HOSPITAL_COMMUNITY): Payer: Self-pay | Admitting: *Deleted

## 2012-06-29 ENCOUNTER — Inpatient Hospital Stay (HOSPITAL_COMMUNITY)
Admission: EM | Admit: 2012-06-29 | Discharge: 2012-07-07 | DRG: 309 | Disposition: A | Discharge status: Home Health | Payer: Medicare Other | Attending: Internal Medicine | Admitting: Internal Medicine

## 2012-06-29 ENCOUNTER — Other Ambulatory Visit: Payer: Self-pay

## 2012-06-29 ENCOUNTER — Emergency Department (HOSPITAL_COMMUNITY): Payer: Medicare Other

## 2012-06-29 DIAGNOSIS — D630 Anemia in neoplastic disease: Secondary | ICD-10-CM | POA: Diagnosis present

## 2012-06-29 DIAGNOSIS — R52 Pain, unspecified: Secondary | ICD-10-CM

## 2012-06-29 DIAGNOSIS — B961 Klebsiella pneumoniae [K. pneumoniae] as the cause of diseases classified elsewhere: Secondary | ICD-10-CM | POA: Diagnosis present

## 2012-06-29 DIAGNOSIS — R404 Transient alteration of awareness: Secondary | ICD-10-CM | POA: Diagnosis present

## 2012-06-29 DIAGNOSIS — D649 Anemia, unspecified: Secondary | ICD-10-CM

## 2012-06-29 DIAGNOSIS — R531 Weakness: Secondary | ICD-10-CM

## 2012-06-29 DIAGNOSIS — N390 Urinary tract infection, site not specified: Secondary | ICD-10-CM | POA: Diagnosis present

## 2012-06-29 DIAGNOSIS — I509 Heart failure, unspecified: Secondary | ICD-10-CM

## 2012-06-29 DIAGNOSIS — R0602 Shortness of breath: Secondary | ICD-10-CM | POA: Diagnosis present

## 2012-06-29 DIAGNOSIS — I10 Essential (primary) hypertension: Secondary | ICD-10-CM | POA: Diagnosis present

## 2012-06-29 DIAGNOSIS — Z79899 Other long term (current) drug therapy: Secondary | ICD-10-CM

## 2012-06-29 DIAGNOSIS — B9689 Other specified bacterial agents as the cause of diseases classified elsewhere: Secondary | ICD-10-CM | POA: Diagnosis present

## 2012-06-29 DIAGNOSIS — D471 Chronic myeloproliferative disease: Secondary | ICD-10-CM

## 2012-06-29 DIAGNOSIS — F17200 Nicotine dependence, unspecified, uncomplicated: Secondary | ICD-10-CM | POA: Diagnosis present

## 2012-06-29 DIAGNOSIS — E8779 Other fluid overload: Secondary | ICD-10-CM | POA: Diagnosis present

## 2012-06-29 DIAGNOSIS — R5383 Other fatigue: Secondary | ICD-10-CM

## 2012-06-29 DIAGNOSIS — I4892 Unspecified atrial flutter: Principal | ICD-10-CM | POA: Diagnosis present

## 2012-06-29 DIAGNOSIS — E039 Hypothyroidism, unspecified: Secondary | ICD-10-CM

## 2012-06-29 DIAGNOSIS — E119 Type 2 diabetes mellitus without complications: Secondary | ICD-10-CM | POA: Diagnosis present

## 2012-06-29 DIAGNOSIS — D47Z9 Other specified neoplasms of uncertain behavior of lymphoid, hematopoietic and related tissue: Secondary | ICD-10-CM | POA: Diagnosis present

## 2012-06-29 DIAGNOSIS — E8770 Fluid overload, unspecified: Secondary | ICD-10-CM

## 2012-06-29 HISTORY — DX: Chronic myeloproliferative disease: D47.1

## 2012-06-29 HISTORY — DX: Essential (primary) hypertension: I10

## 2012-06-29 LAB — PROTIME-INR
INR: 1.57 — ABNORMAL HIGH (ref 0.00–1.49)
Prothrombin Time: 19.1 seconds — ABNORMAL HIGH (ref 11.6–15.2)

## 2012-06-29 LAB — COMPREHENSIVE METABOLIC PANEL
AST: 15 U/L (ref 0–37)
BUN: 18 mg/dL (ref 6–23)
CO2: 23 mEq/L (ref 19–32)
Calcium: 8.7 mg/dL (ref 8.4–10.5)
Chloride: 105 mEq/L (ref 96–112)
Creatinine, Ser: 0.96 mg/dL (ref 0.50–1.35)
GFR calc Af Amer: 87 mL/min — ABNORMAL LOW (ref >90)
GFR calc non Af Amer: 75 mL/min — ABNORMAL LOW (ref >90)
Glucose, Bld: 101 mg/dL — ABNORMAL HIGH (ref 70–99)
Total Bilirubin: 0.4 mg/dL (ref 0.3–1.2)

## 2012-06-29 LAB — CBC
HCT: 24.7 % — ABNORMAL LOW (ref 39.0–52.0)
Hemoglobin: 7.7 g/dL — ABNORMAL LOW (ref 13.0–17.0)
MCH: 29.1 pg (ref 26.0–34.0)
MCHC: 31.2 g/dL (ref 30.0–36.0)
MCV: 93.2 fL (ref 78.0–100.0)
RBC: 2.65 MIL/uL — ABNORMAL LOW (ref 4.22–5.81)

## 2012-06-29 LAB — APTT: aPTT: 45 seconds — ABNORMAL HIGH (ref 24–37)

## 2012-06-29 LAB — POCT I-STAT TROPONIN I: Troponin i, poc: 0 ng/mL (ref 0.00–0.08)

## 2012-06-29 LAB — GLUCOSE, CAPILLARY

## 2012-06-29 MED ORDER — DILTIAZEM HCL 100 MG IV SOLR
5.0000 mg/h | Freq: Once | INTRAVENOUS | Status: AC
  Administered 2012-06-29: 10 mg/h via INTRAVENOUS

## 2012-06-29 MED ORDER — DILTIAZEM HCL 25 MG/5ML IV SOLN: 10.0000 mg | Freq: Once | INTRAVENOUS | Status: DC

## 2012-06-29 MED ORDER — DILTIAZEM LOAD VIA INFUSION
10.0000 mg | Freq: Once | INTRAVENOUS | Status: AC
  Administered 2012-06-29: 10 mg via INTRAVENOUS

## 2012-06-29 MED ORDER — DILTIAZEM LOAD VIA INFUSION
15.0000 mg | Freq: Once | INTRAVENOUS | Status: AC
  Administered 2012-06-29: 15 mg via INTRAVENOUS
  Filled 2012-06-29: qty 15

## 2012-06-29 MED ORDER — ADENOSINE 6 MG/2ML IV SOLN
6.0000 mg | Freq: Once | INTRAVENOUS | Status: AC
  Administered 2012-06-29: 6 mg via INTRAVENOUS
  Filled 2012-06-29 (×3): qty 2

## 2012-06-29 MED ORDER — ADENOSINE 6 MG/2ML IV SOLN
12.0000 mg | Freq: Once | INTRAVENOUS | Status: AC
  Administered 2012-06-29: 12 mg via INTRAVENOUS

## 2012-06-29 MED ORDER — FUROSEMIDE 10 MG/ML IJ SOLN
60.0000 mg | Freq: Once | INTRAMUSCULAR | Status: DC
  Filled 2012-06-29: qty 8

## 2012-06-29 NOTE — ED Notes (Signed)
Pt presents w/ c/o shortness of breath, generalized body aches and intermittent chest discomfort starting yesterday. Pt has experienced sleeplessness for the past x 2 weeks. Pt c/o some nausea, no vomiting. Pt presents w/ shortness of breath at rest and tachycardia.

## 2012-06-29 NOTE — ED Provider Notes (Signed)
History     CSN: [id number]  Arrival date & time 06/29/12  2058   First MD Initiated Contact with Patient 06/29/12 2142      Chief Complaint  Patient presents with  . Shortness of Breath  . Chest Pain   Level V caveat for confusion  (Consider location/radiation/quality/duration/timing/severity/associated sxs/prior treatment) HPI Per patient he's been having problems since November. He states he has chest pain sometimes, he has some shortness of breath. He states he has a cough and sometimes has different colored mucus coming up. His daughter states has a bone marrow problem and he gets a blood transfusion frequently. She states he was getting it every 3 weeks but now has been getting them every 2 weeks. His last transfusion was 2 days ago. She states his hemoglobin normally gets down to 6 when they transfuse him. He is followed by Dr. Clelia Croft. She states he's never been told he had a racing heartbeat 4.  PCP Dr. Margaretmary Bayley Hematologist Dr. Clelia Croft   Past Medical History  Diagnosis Date  . Diabetes mellitus   . Anemia   . Hypothyroidism   . Hypertension   myelofibrosis  History reviewed. No pertinent past surgical history.  History reviewed. No pertinent family history.  History  Substance Use Topics  . Smoking status: Current Everyday Smoker    Types: Cigars  . Smokeless tobacco: Not on file  . Alcohol Use: No  chews tobacco Lives with daughter    Review of Systems  All other systems reviewed and are negative.    Allergies  Review of patient's allergies indicates no known allergies.  Home Medications   Current Outpatient Rx  Name Route Sig Dispense Refill  . AMLODIPINE-OLMESARTAN 5-20 MG PO TABS Oral Take 1 tablet by mouth daily.    . ASPIRIN 81 MG PO TABS Oral Take 81 mg by mouth daily after breakfast.     . FEBUXOSTAT 40 MG PO TABS Oral Take 40 mg by mouth daily.    [name] Kitchen HYDROCODONE-ACETAMINOPHEN 5-500 MG PO CAPS Oral Take 1 capsule by mouth every 6 (six)  hours as needed for pain. 30 capsule 0  . LEVOTHYROXINE SODIUM 88 MCG PO TABS Oral Take 88 mcg by mouth daily.    [name] Kitchen LOSARTAN POTASSIUM 100 MG PO TABS Oral Take 1 tablet by mouth daily as needed. For swelling    . PRENATAL MULTIVITAMIN CH Oral Take 1 tablet by mouth daily.      BP 119/61  Pulse 152  Temp 98.9 F (37.2 C) (Oral)  Resp 15  SpO2 100%  Vital signs normal except tachycardia   Physical Exam  Nursing note and vitals reviewed. Constitutional: He is oriented to person, place, and time. He appears well-developed and well-nourished.  Non-toxic appearance. He does not appear ill. No distress.  HENT:  Head: Normocephalic and atraumatic.  Right Ear: External ear normal.  Left Ear: External ear normal.  Nose: Nose normal. No mucosal edema or rhinorrhea.  Mouth/Throat: Oropharynx is clear and moist and mucous membranes are normal. No dental abscesses or uvula swelling.  Eyes: Conjunctivae and EOM are normal. Pupils are equal, round, and reactive to light.  Neck: Normal range of motion and full passive range of motion without pain. Neck supple.  Cardiovascular: Regular rhythm and normal heart sounds.  Tachycardia present.  Exam reveals no gallop and no friction rub.   No murmur heard. Pulmonary/Chest: Effort normal and breath sounds normal. No respiratory distress. He has no wheezes. He has no rhonchi.  He has no rales. He exhibits no tenderness and no crepitus.       Patient has some diminished breath sounds with some coarse breath sounds at the bottom of the bases  Abdominal: Soft. Normal appearance and bowel sounds are normal. He exhibits no distension. There is no tenderness. There is no rebound and no guarding.  Musculoskeletal: Normal range of motion. He exhibits edema. He exhibits no tenderness.       Moves all extremities well. Patient has 1+ pitting edema of lower legs  Neurological: He is alert and oriented to person, place, and time. He has normal strength. No cranial nerve  deficit.  Skin: Skin is warm, dry and intact. No rash noted. No erythema. No pallor.  Psychiatric: He has a normal mood and affect. His speech is normal and behavior is normal. His mood appears not anxious.    ED Course  Procedures (including critical care time)   Medications  levothyroxine (SYNTHROID, LEVOTHROID) 88 MCG tablet (not administered)  Prenatal Vit-Fe Fumarate-FA (PRENATAL MULTIVITAMIN) TABS (not administered)  losartan (COZAAR) 100 MG tablet (not administered)  adenosine (ADENOCARD) 6 MG/2ML injection 6 mg (not administered)  diltiazem (CARDIZEM) 1 mg/mL load via infusion 15 mg (not administered)  adenosine (ADENOCARD) 6 MG/2ML injection 12 mg (not administered)  furosemide (LASIX) injection 60 mg (not administered)  diltiazem (CARDIZEM) 100 mg in dextrose 5 % 100 mL infusion (10 mg/hr Intravenous New Bag/Given 06/29/12 2224)  diltiazem (CARDIZEM) 1 mg/mL load via infusion 10 mg (10 mg Intravenous Given 06/29/12 2229)   Patient was not responding to Cardizem drip. He was given Adenocard 6 mg IV rapid bolus however patient sat up while he was getting it and we were unable to read his monitor  because artifact. However he remained in  tachycardia. He was given Adenocard 12 mg rapid IV push this time when his rate slowed down it was apparent he was in atrial flutter and his heart rate went down to 60 and then shortly thereafter came back up to the 150 range. He was given an additional Cardizem bolus and his Cardizem drip was titrated.  23:56 Dr Arthor Captain admit to tele, team 8   Results for orders placed during the hospital encounter of 06/29/12  CBC      Component Value Range   WBC 4.7  4.0 - 10.5 K/uL   RBC 2.65 (*) 4.22 - 5.81 MIL/uL   Hemoglobin 7.7 (*) 13.0 - 17.0 g/dL   HCT 24.4 (*) 01.0 - 27.2 %   MCV 93.2  78.0 - 100.0 fL   MCH 29.1  26.0 - 34.0 pg   MCHC 31.2  30.0 - 36.0 g/dL   RDW 53.6 (*) 64.4 - 03.4 %   Platelets 353  150 - 400 K/uL  PRO B NATRIURETIC PEPTIDE       Component Value Range   Pro B Natriuretic peptide (BNP) 7889.0 (*) 0 - 450 pg/mL  COMPREHENSIVE METABOLIC PANEL      Component Value Range   Sodium 139  135 - 145 mEq/L   Potassium 4.0  3.5 - 5.1 mEq/L   Chloride 105  96 - 112 mEq/L   CO2 23  19 - 32 mEq/L   Glucose, Bld 101 (*) 70 - 99 mg/dL   BUN 18  6 - 23 mg/dL   Creatinine, Ser 7.42  0.50 - 1.35 mg/dL   Calcium 8.7  8.4 - 59.5 mg/dL   Total Protein 6.8  6.0 - 8.3 g/dL  Albumin 2.9 (*) 3.5 - 5.2 g/dL   AST 15  0 - 37 U/L   ALT 12  0 - 53 U/L   Alkaline Phosphatase 72  39 - 117 U/L   Total Bilirubin 0.4  0.3 - 1.2 mg/dL   GFR calc non Af Amer 75 (*) >90 mL/min   GFR calc Af Amer 87 (*) >90 mL/min  APTT      Component Value Range   aPTT 45 (*) 24 - 37 seconds  PROTIME-INR      Component Value Range   Prothrombin Time 19.1 (*) 11.6 - 15.2 seconds   INR 1.57 (*) 0.00 - 1.49  MAGNESIUM      Component Value Range   Magnesium 2.1  1.5 - 2.5 mg/dL  GLUCOSE, CAPILLARY      Component Value Range   Glucose-Capillary 94  70 - 99 mg/dL   Comment 1 Notify RN     Comment 2 Documented in Chart    POCT I-STAT TROPONIN I      Component Value Range   Troponin i, poc 0.00  0.00 - 0.08 ng/mL   Comment 3            Laboratory interpretation all normal except anemia, elevated BNP  Chest Portable 1 View  06/29/2012  *RADIOLOGY REPORT*  Clinical Data: Shortness of breath, cough  PORTABLE CHEST - 1 VIEW  Comparison: 12/24/2006  Findings: Low lung volumes.  Small bilateral pleural effusions. Patchy bibasilar consolidation or atelectasis.  Mild diffuse interstitial edema/infiltrates.  Heart size upper limits normal for technique.  IMPRESSION:  1.  Bibasilar infiltrates or edema with small effusions.  Original Report Authenticated By: Osa Craver, M.D.    Date: 06/29/2012  Rate: 153  Rhythm: sinus tachycardia  QRS Axis: normal  Intervals: normal  ST/T Wave abnormalities: nonspecific ST/T changes  Conduction Disutrbances:none   Narrative Interpretation: low voltage  Old EKG Reviewed: changes noted from 12/24/2006 was in NSR 66    1. Shortness of breath   2. Atrial flutter   3. CHF (congestive heart failure)   4. Anemia     Plan admission   CRITICAL CARE Performed by: Devoria Albe L   Total critical care time: 40 min   Critical care time was exclusive of separately billable procedures and treating other patients.  Critical care was necessary to treat or prevent imminent or life-threatening deterioration.  Critical care was time spent personally by me on the following activities: development of treatment plan with patient and/or surrogate as well as nursing, discussions with consultants, evaluation of patient's response to treatment, examination of patient, obtaining history from patient or surrogate, ordering and performing treatments and interventions, ordering and review of laboratory studies, ordering and review of radiographic studies, pulse oximetry and re-evaluation of patient's condition.  Devoria Albe, MD, FACEP   MDM          Ward Givens, MD 06/30/12 0001

## 2012-06-30 ENCOUNTER — Encounter (HOSPITAL_COMMUNITY): Payer: Self-pay | Admitting: Internal Medicine

## 2012-06-30 ENCOUNTER — Other Ambulatory Visit: Payer: Self-pay | Admitting: *Deleted

## 2012-06-30 DIAGNOSIS — E039 Hypothyroidism, unspecified: Secondary | ICD-10-CM

## 2012-06-30 DIAGNOSIS — R0602 Shortness of breath: Secondary | ICD-10-CM | POA: Diagnosis present

## 2012-06-30 DIAGNOSIS — I509 Heart failure, unspecified: Secondary | ICD-10-CM | POA: Insufficient documentation

## 2012-06-30 DIAGNOSIS — E8779 Other fluid overload: Secondary | ICD-10-CM

## 2012-06-30 DIAGNOSIS — E8770 Fluid overload, unspecified: Secondary | ICD-10-CM

## 2012-06-30 DIAGNOSIS — I4892 Unspecified atrial flutter: Principal | ICD-10-CM | POA: Diagnosis present

## 2012-06-30 DIAGNOSIS — D471 Chronic myeloproliferative disease: Secondary | ICD-10-CM

## 2012-06-30 HISTORY — DX: Chronic myeloproliferative disease: D47.1

## 2012-06-30 LAB — TSH: TSH: 4.059 u[IU]/mL (ref 0.350–4.500)

## 2012-06-30 LAB — URINALYSIS, ROUTINE W REFLEX MICROSCOPIC
Glucose, UA: NEGATIVE mg/dL
Ketones, ur: NEGATIVE mg/dL
Nitrite: NEGATIVE
Protein, ur: NEGATIVE mg/dL
Urobilinogen, UA: 0.2 mg/dL (ref 0.0–1.0)

## 2012-06-30 LAB — CARDIAC PANEL(CRET KIN+CKTOT+MB+TROPI)
CK, MB: 3 ng/mL (ref 0.3–4.0)
Relative Index: INVALID (ref 0.0–2.5)
Relative Index: INVALID (ref 0.0–2.5)
Total CK: 58 U/L (ref 7–232)
Total CK: 51 U/L (ref 7–232)
Total CK: 37 U/L (ref 7–232)
Troponin I: <0.3 ng/mL (ref <0.30)

## 2012-06-30 LAB — URINE MICROSCOPIC-ADD ON

## 2012-06-30 MED ORDER — LORAZEPAM 2 MG/ML IJ SOLN
0.5000 mg | Freq: Once | INTRAMUSCULAR | Status: AC
  Filled 2012-06-30: qty 1

## 2012-06-30 MED ORDER — LEVOTHYROXINE SODIUM 88 MCG PO TABS
88.0000 ug | ORAL_TABLET | Freq: Every day | ORAL | Status: DC
  Administered 2012-07-01 – 2012-07-07 (×7): 88 ug via ORAL
  Filled 2012-06-30 (×8): qty 1

## 2012-06-30 MED ORDER — FUROSEMIDE 40 MG PO TABS
40.0000 mg | ORAL_TABLET | Freq: Two times a day (BID) | ORAL | Status: AC
  Administered 2012-06-30 – 2012-07-01 (×2): 40 mg via ORAL
  Filled 2012-06-30 (×5): qty 1

## 2012-06-30 MED ORDER — FEBUXOSTAT 40 MG PO TABS
40.0000 mg | ORAL_TABLET | Freq: Every day | ORAL | Status: DC
  Administered 2012-07-01 – 2012-07-07 (×7): 40 mg via ORAL
  Filled 2012-06-30 (×8): qty 1

## 2012-06-30 MED ORDER — DILTIAZEM HCL 100 MG IV SOLR
5.0000 mg/h | INTRAVENOUS | Status: DC
  Administered 2012-06-30 – 2012-07-02 (×6): 15 mg/h via INTRAVENOUS
  Administered 2012-07-02: 5 mg/h via INTRAVENOUS
  Administered 2012-07-02: 15 mg/h via INTRAVENOUS
  Filled 2012-06-30 (×7): qty 100

## 2012-06-30 MED ORDER — SODIUM CHLORIDE 0.9 % IJ SOLN: 3.0000 mL | INTRAMUSCULAR | Status: DC | PRN

## 2012-06-30 MED ORDER — SODIUM CHLORIDE 0.9 % IJ SOLN
3.0000 mL | Freq: Two times a day (BID) | INTRAMUSCULAR | Status: DC
  Administered 2012-06-30 – 2012-07-07 (×10): 3 mL via INTRAVENOUS

## 2012-06-30 MED ORDER — ASPIRIN EC 81 MG PO TBEC
81.0000 mg | DELAYED_RELEASE_TABLET | Freq: Every day | ORAL | Status: DC
  Administered 2012-07-01 – 2012-07-07 (×7): 81 mg via ORAL
  Filled 2012-06-30 (×10): qty 1

## 2012-06-30 MED ORDER — FUROSEMIDE 10 MG/ML IJ SOLN
40.0000 mg | Freq: Three times a day (TID) | INTRAMUSCULAR | Status: DC
  Administered 2012-06-30: 40 mg via INTRAVENOUS
  Filled 2012-06-30 (×4): qty 4

## 2012-06-30 MED ORDER — ENSURE COMPLETE PO LIQD
237.0000 mL | Freq: Every day | ORAL | Status: DC
  Administered 2012-06-30 – 2012-07-03 (×4): 237 mL via ORAL

## 2012-06-30 MED ORDER — ACETAMINOPHEN 325 MG PO TABS
650.0000 mg | ORAL_TABLET | ORAL | Status: DC | PRN
  Administered 2012-06-30 – 2012-07-06 (×3): 650 mg via ORAL
  Filled 2012-06-30 (×3): qty 2

## 2012-06-30 MED ORDER — POTASSIUM CHLORIDE CRYS ER 20 MEQ PO TBCR
20.0000 meq | EXTENDED_RELEASE_TABLET | Freq: Two times a day (BID) | ORAL | Status: DC
  Administered 2012-06-30 – 2012-07-07 (×13): 20 meq via ORAL
  Filled 2012-06-30 (×18): qty 1

## 2012-06-30 MED ORDER — HYDROCODONE-ACETAMINOPHEN 5-325 MG PO TABS: 1.0000 | ORAL_TABLET | Freq: Four times a day (QID) | ORAL | Status: DC | PRN

## 2012-06-30 MED ORDER — LORAZEPAM 2 MG/ML IJ SOLN
0.5000 mg | Freq: Once | INTRAMUSCULAR | Status: DC
  Filled 2012-06-30: qty 1

## 2012-06-30 MED ORDER — ADULT MULTIVITAMIN W/MINERALS CH
1.0000 | ORAL_TABLET | Freq: Every day | ORAL | Status: DC
  Administered 2012-07-01 – 2012-07-07 (×7): 1 via ORAL
  Filled 2012-06-30 (×8): qty 1

## 2012-06-30 MED ORDER — HEPARIN SODIUM (PORCINE) 5000 UNIT/ML IJ SOLN
5000.0000 [IU] | Freq: Three times a day (TID) | INTRAMUSCULAR | Status: DC
  Administered 2012-06-30 – 2012-07-07 (×19): 5000 [IU] via SUBCUTANEOUS
  Filled 2012-06-30 (×26): qty 1

## 2012-06-30 MED ORDER — SODIUM CHLORIDE 0.9 % IV SOLN: 250.0000 mL | INTRAVENOUS | Status: DC | PRN

## 2012-06-30 MED ORDER — LOSARTAN POTASSIUM 50 MG PO TABS
100.0000 mg | ORAL_TABLET | Freq: Every day | ORAL | Status: DC
  Administered 2012-07-01 – 2012-07-02 (×2): 100 mg via ORAL
  Filled 2012-06-30 (×3): qty 2

## 2012-06-30 MED ORDER — CARVEDILOL 6.25 MG PO TABS
6.2500 mg | ORAL_TABLET | Freq: Two times a day (BID) | ORAL | Status: DC
  Administered 2012-06-30 – 2012-07-01 (×2): 6.25 mg via ORAL
  Filled 2012-06-30 (×7): qty 1

## 2012-06-30 MED ORDER — ONDANSETRON HCL 4 MG/2ML IJ SOLN
4.0000 mg | Freq: Four times a day (QID) | INTRAMUSCULAR | Status: DC | PRN
  Administered 2012-07-06: 4 mg via INTRAVENOUS
  Filled 2012-06-30: qty 2

## 2012-06-30 NOTE — Progress Notes (Signed)
Occupational/Physical Therapy Note Spoke to nursing who requests that therapy hold off right now as he is in restraints and not cooperative. Will try back tomorrow and see how pt is. Judithann Sauger OTR/L [phone] 06/30/2012  [name], PT [phone]

## 2012-06-30 NOTE — H&P (Signed)
[facility] History and Physical  Kyle Heath JXB:[id number] DOB: 08-04-1927 DOA: 06/29/2012  Referring physician: Devoria Albe PCP: Laurena Slimmer, MD   Chief Complaint: Shortness of breath  HPI:  [name] is an 78 year old African American male with past medical history of myeloproliferative disorder, HTN, DM and hypothyroidism. He brought to the hospital by his family because of progressive worsening of shortness of breath. Patient said for the past several weeks he had shortness of breath and he thought initially it was secondary to his profound anemia, please note he has myeloproliferative disorder and he is transfusion dependent, he is getting transfusion of 2 units of blood every other week. 2 days ago he called his oncologist, and he received transfusion of 2 units of packed RBCs, his symptoms did not resolve, his daughter noticed that he sleeps sitting up, he also has some swelling in his legs so she brought him to the hospital for further evaluation. Upon initial evaluation in the emergency department he has proBNP of over 7800 and his chest x-ray showed bibasilar opacities consistent with acute CHF. He also has heart rate of 150, it was thought initially to be SVT, adenosine was given and it looks like a flutter now.  Review of Systems:  Constitutional: Generalized weakness Eyes: negative for irritation, redness and visual disturbance Ears, nose, mouth, throat, and face: negative for earaches, epistaxis, nasal congestion and sore throat Respiratory: negative for cough, dyspnea on exertion, sputum and wheezing Cardiovascular: SOB, orthopnea, PND and bilateral leg swelling Gastrointestinal: negative for abdominal pain, constipation, diarrhea, melena, nausea and vomiting Genitourinary:negative for dysuria, frequency and hematuria Hematologic/lymphatic: negative for bleeding, easy bruising and lymphadenopathy Musculoskeletal:negative for arthralgias, muscle weakness and stiff  joints Neurological: negative for coordination problems, gait problems, headaches and weakness Endocrine: negative for diabetic symptoms including polydipsia, polyuria and weight loss Allergic/Immunologic: negative for anaphylaxis, hay fever and urticaria  Past Medical History  Diagnosis Date  . Diabetes mellitus   . Anemia   . Hypothyroidism   . Hypertension   . Myeloproliferative disorder 06/30/2012   History reviewed. No pertinent past surgical history. Social History:  reports that he has been smoking Cigars.  He does not have any smokeless tobacco history on file. He reports that he does not drink alcohol or use illicit drugs.   No Known Allergies  History reviewed. No pertinent family history.   Prior to Admission medications   Medication Sig Start Date End Date Taking? Authorizing Provider  amLODipine-olmesartan (AZOR) 5-20 MG per tablet Take 1 tablet by mouth daily.   Yes Historical Provider, MD  aspirin 81 MG tablet Take 81 mg by mouth daily after breakfast.    Yes Historical Provider, MD  febuxostat (ULORIC) 40 MG tablet Take 40 mg by mouth daily.   Yes Historical Provider, MD  hydrocodone-acetaminophen (LORCET-HD) 5-500 MG per capsule Take 1 capsule by mouth every 6 (six) hours as needed for pain. 06/27/12 07/07/12 Yes Benjiman Core, MD  levothyroxine (SYNTHROID, LEVOTHROID) 88 MCG tablet Take 88 mcg by mouth daily.   Yes Historical Provider, MD  losartan (COZAAR) 100 MG tablet Take 1 tablet by mouth daily as needed. For swelling 04/08/12  Yes Historical Provider, MD  Prenatal Vit-Fe Fumarate-FA (PRENATAL MULTIVITAMIN) TABS Take 1 tablet by mouth daily.   Yes Historical Provider, MD   Physical Exam: Filed Vitals:   06/29/12 2115 06/29/12 2136 06/29/12 2200 06/29/12 2351  BP: 110/66 119/61  117/66  Pulse: 154 152  150  Temp: 98.9 F (37.2 C)  98.4 F (36.9 C)  TempSrc: Oral   Oral  Resp: 20 15  22   SpO2: 95% 100% 98% 98%   General appearance: alert, cooperative and  no distress  Head: Normocephalic, without obvious abnormality, atraumatic  Eyes: conjunctivae/corneas clear. PERRL, EOM's intact. Fundi benign.  Nose: Nares normal. Septum midline. Mucosa normal. No drainage or sinus tenderness.  Throat: lips, mucosa, and tongue normal; teeth and gums normal  Neck: Supple, no masses, no cervical lymphadenopathy, no JVD appreciated, no meningeal signs Resp: Bibasilar crackles Chest wall: no tenderness  Cardio: regular rate and rhythm, S1, S2 normal, no murmur, click, rub or gallop  GI: soft, non-tender; bowel sounds normal; no masses, no organomegaly  Extremities: extremities normal, atraumatic, there is bilateral +1 edema Skin: Skin color, texture, turgor normal. No rashes or lesions  Neurologic: Alert and oriented X 3, normal strength and tone. Normal symmetric reflexes. Normal coordination and gait  Labs on Admission:  Basic Metabolic Panel:  Lab 06/29/12 4098  NA 139  K 4.0  CL 105  CO2 23  GLUCOSE 101*  BUN 18  CREATININE 0.96  CALCIUM 8.7  MG 2.1  PHOS --   Liver Function Tests:  Lab 06/29/12 2215  AST 15  ALT 12  ALKPHOS 72  BILITOT 0.4  PROT 6.8  ALBUMIN 2.9*   No results found for this basename: LIPASE:5,AMYLASE:5 in the last 168 hours No results found for this basename: AMMONIA:5 in the last 168 hours CBC:  Lab 06/29/12 2200 06/27/12 0841  WBC 4.7 3.9*  NEUTROABS -- 2.5  HGB 7.7* 7.3*  HCT 24.7* 23.6*  MCV 93.2 92.2  PLT 353 345   Cardiac Enzymes: No results found for this basename: CKTOTAL:5,CKMB:5,CKMBINDEX:5,TROPONINI:5 in the last 168 hours  BNP (last 3 results)  Basename 06/29/12 2200  PROBNP 7889.0*   CBG:  Lab 06/29/12 2155  GLUCAP 94    Radiological Exams on Admission: Chest Portable 1 View  06/29/2012  *RADIOLOGY REPORT*  Clinical Data: Shortness of breath, cough  PORTABLE CHEST - 1 VIEW  Comparison: 12/24/2006  Findings: Low lung volumes.  Small bilateral pleural effusions. Patchy bibasilar  consolidation or atelectasis.  Mild diffuse interstitial edema/infiltrates.  Heart size upper limits normal for technique.  IMPRESSION:  1.  Bibasilar infiltrates or edema with small effusions.  Original Report Authenticated By: Osa Craver, M.D.    EKG: Independently reviewed.  Assessment/Plan Principal Problem:  *Acute CHF Active Problems:  Anemia  Myeloproliferative disorder  Shortness of breath  Atrial flutter   Acute CHF As specified time, I will obtain 2-D echocardiogram, patient already started on IV Lasix for diuresis by the ED physician all continue on that they'll do 40 mg of Lasix every 8 hours, patient also on losartan all continue that, according was added at 6.25 mg twice a day. We will consult cardiology in the morning.  Atrial flutter Patient started on Cardizem drip already by the ED physician all continue on that, his heart rate is still about 150 when I saw him, continue the Cardizem. Coreg at 6.25 was added of fluid that will help to control the rate. Check TSH. Cardiology to advise about management as well as anticoagulation.  Anemia Part of myeloproliferative disorder, patient has baseline hemoglobin of 6-7, now hemoglobin of 7.7. Anemia can cause a high output heart failure as well as can cause arrhythmias. But this seems chronic and no recent changes.  Myeloproliferative disorder [facility].  Code Status:  Full code Family Communication: His son and daughter were at bedside and the plan is explained. Disposition Plan: Telemetry  Time spent: 70 minutes  [facility] Pager [phone]  If 7PM-7AM, please contact night-coverage [email or web address] Password [facility] 06/30/2012, 12:56 AM

## 2012-06-30 NOTE — Progress Notes (Signed)
Page to MD [facility] to report changes in cardiac rhythm.[name] Kitchen

## 2012-06-30 NOTE — Progress Notes (Signed)
INITIAL ADULT NUTRITION ASSESSMENT Date: 06/30/2012   Time: 3:21 PM Reason for Assessment: Nutrition Risk for dysphagia  ASSESSMENT: Male 78 y.o.  Dx: Atrial flutter  Hx:  Past Medical History  Diagnosis Date  . Diabetes mellitus   . Anemia   . Hypothyroidism   . Hypertension   . Myeloproliferative disorder 06/30/2012    Related Meds:  Scheduled Meds:   . adenosine (ADENOCARD) IV  12 mg Intravenous Once  . adenosine (ADENOCARD) IV  6 mg Intravenous Once  . aspirin EC  81 mg Oral QPC breakfast  . carvedilol  6.25 mg Oral BID  . diltiazem  10 mg Intravenous Once  . diltiazem  15 mg Intravenous Once  . diltiazem (CARDIZEM) infusion  5-15 mg/hr Intravenous Once  . febuxostat  40 mg Oral Daily  . furosemide  40 mg Oral BID  . heparin  5,000 Units Subcutaneous Q8H  . levothyroxine  88 mcg Oral Daily  . LORazepam  0.5 mg Intravenous Once  . LORazepam  0.5 mg Intravenous Once  . losartan  100 mg Oral Daily  . multivitamin with minerals  1 tablet Oral Daily  . potassium chloride  20 mEq Oral BID  . sodium chloride  3 mL Intravenous Q12H  . DISCONTD: diltiazem  10 mg Intravenous Once  . DISCONTD: furosemide  40 mg Intravenous Q8H  . DISCONTD: furosemide  60 mg Intravenous Once   Continuous Infusions:   . diltiazem (CARDIZEM) infusion 15 mg/hr (06/30/12 1516)   PRN Meds:.sodium chloride, acetaminophen, ondansetron (ZOFRAN) IV, sodium chloride, DISCONTD: HYDROcodone-acetaminophen   Ht:  5'8" per family  Wt:  182 lb.  Wt Readings from Last 10 Encounters:  05/30/12 182 lb 3.2 oz (82.645 kg)  04/18/12 184 lb 11.2 oz (83.779 kg)  02/15/12 185 lb 4.8 oz (84.052 kg)  02/11/12 186 lb (84.369 kg)  02/06/12 186 lb 1.6 oz (84.414 kg)     Ideal Wt:    70 kg % Ideal Wt: 118%  BMI: 27.67 kg/m^2 (Overweight)  Food/Nutrition Related Hx: Patient's daughter reported pt with poor appetite and intake. She stated pt recently started an appetite stimulant which has helped some with  pt PO intake. She reported pt drinks Ensure at home when PO intake is poor.  Labs:  CMP     Component Value Date/Time   NA 139 06/29/2012 2215   K 4.0 06/29/2012 2215   CL 105 06/29/2012 2215   CO2 23 06/29/2012 2215   GLUCOSE 101* 06/29/2012 2215   BUN 18 06/29/2012 2215   CREATININE 0.96 06/29/2012 2215   CALCIUM 8.7 06/29/2012 2215   PROT 6.8 06/29/2012 2215   ALBUMIN 2.9* 06/29/2012 2215   AST 15 06/29/2012 2215   ALT 12 06/29/2012 2215   ALKPHOS 72 06/29/2012 2215   BILITOT 0.4 06/29/2012 2215   GFRNONAA 75* 06/29/2012 2215   GFRAA 87* 06/29/2012 2215    Intake/Output Summary (Last 24 hours) at 06/30/12 1524 Last data filed at 06/30/12 0645  Gross per 24 hour  Intake      0 ml  Output    100 ml  Net   -100 ml     Diet Order: Cardiac  Supplements/Tube Feeding: none at this time  IVF:    diltiazem (CARDIZEM) infusion Last Rate: 15 mg/hr (06/30/12 1516)    Estimated Nutritional Needs:   Kcal: 1500-1650 Protein: 91-108 grams Fluid: 1 ml per kcal intake  NUTRITION DIAGNOSIS: -Predicted suboptimal nutrient intake (NI-5.11.1).  Status: Ongoing  RELATED TO:  history of poor appetite  AS EVIDENCE BY: pt daughter reported pt with poor intake and need for appetite stimulant  MONITORING/EVALUATION(Goals): PO intake, weights, labs, swallowing ability.  1. PO intake > 75% at meals and supplements.   EDUCATION NEEDS: -No education needs identified at this time  INTERVENTION: 1. Will order pt Ensure once daily, provides 250 kcal and 9 grams of protein daily.  2. RD to follow for nutrition plan of care.   Dietitian [phone]  DOCUMENTATION CODES Per approved criteria  -Not Applicable    Iven Finn [facility] 06/30/2012, 3:21 PM

## 2012-06-30 NOTE — ED Notes (Signed)
Increased patient cardizem to 15mg  secondary to hr remaining in 150s . Will cont to monitor

## 2012-06-30 NOTE — Progress Notes (Addendum)
  Echocardiogram 2D Echocardiogram has been performed.  Kyle Heath 06/30/2012, 10:01 AM 

## 2012-06-30 NOTE — ED Notes (Signed)
Patient is receiving cardizem for HR controll. Not effective at this time. Patient is to receive adensine. To attempt to slow heart rate down. Awaiting MD to come to bedside.

## 2012-06-30 NOTE — Progress Notes (Signed)
Pt arrived from ED. Lethargic but responsive to verbal stimuli. Alert to self but unable to verbalize correct responses about his location, age, etc.. Cardizem drip infusing at 15mg /hr. No nonverbal indicators of pain. Family at bedside and was able to answer some questions for admission history. Oriented to room and call bell. Will need to have follow up regarding heart failure education.

## 2012-06-30 NOTE — Progress Notes (Addendum)
Late- entry: patient became very fustrated secondary to beig in the ED for a long period of time. Patient appeared to have some sundowners as the night went on. Patient became very agitated and want to leave and began to pull off telemetry and disconnect IV tubing. Patient remains alert to name being called. Otherwise confused about time and location. Easily reoriented. Made on call Lenny Pastel aware of patient condition and HR per admission to telemetry floor. And orders were given. During episode patient BP dropped to 97/60 and lasix was not given per orders in ED. Patient recived Ativan to assist patient to relax in to get ready to transfer. Ativan was effective last VS upon arrival to floor was 110/65 hr 150s. Family remained at the bedside during episode of confusion.

## 2012-06-30 NOTE — Care Management Note (Signed)
    Page 1 of 2   07/07/2012     3:03:12 PM   CARE MANAGEMENT NOTE 07/07/2012  Patient:  Kyle Heath, Kyle Heath   Account Number:  [id number]  Date Initiated:  06/30/2012  Documentation initiated by:  Lanier Clam  Subjective/Objective Assessment:   ADMITTED W/CHF.     Action/Plan:   FROM HOME W/FAMILY   Anticipated DC Date:  07/07/2012   Anticipated DC Plan:  HOME W HOME HEALTH SERVICES  In-house referral  Clinical Social Worker      DC Planning Services  CM consult      Choice offered to / List presented to:  C-4 Adult Children   DME arranged  3-N-1      DME agency  [facility]     HH arranged  HH-1 RN  HH-2 PT  HH-3 OT      [facility] agency  [facility]   Status of service:  Completed, signed off Medicare Important Message given?   (If response is "NO", the following Medicare IM given date fields will be blank) Date Medicare IM given:   Date Additional Medicare IM given:    Discharge Disposition:  HOME W HOME HEALTH SERVICES  Per UR Regulation:  Reviewed for med. necessity/level of care/duration of stay  If discussed at Long Length of Stay Meetings, dates discussed:    Comments:  07/07/12 Gweneth Fredlund RN,BSN NCM [phone] PATIENT/DAUGHTERS PREFER HOME W/HH,[facility] SUSAN INFORMED OF D/C OF HH/DME.PALLIATIVE TEAM SPOKE TO FAMILY ABOUT SERVICES,D/C PLAN HOME [facility].  07/04/12 Darvis Croft RN,BSN NCM [phone] D/C SNF LIKELY OVER WEEKEND.  07/03/12 Ladajah Soltys RN,BSN NCM [phone] PATIENT/FAMILY NOW IN AGREEMENT TO SNF PER MD RECOMMENDATIONS.  AFTER PATIENT/FAMILY SPOKE TO MD ABOUT TREATMENT, & GOALS PATIENT/FAMILY PT/OT-HH/DME-3N1/TUB SEAT.HHC PROVIDER LIST GIVEN,& PRIVATE SITTER LIST.FAMILY PREFER HOME.CARDIO FOLLOWING.  06/30/12 Arvil Utz RN,BSN  NCM [phone]

## 2012-06-30 NOTE — Progress Notes (Signed)
[facility] PROGRESS NOTE  Kyle Heath ZOX:[id number] DOB: 02/28/1927 DOA: 06/29/2012 PCP: Laurena Slimmer, MD Oncologist: Earl Gala, MD  Assessment/Plan: 1. New onset atrial flutter--TSH normal, echocardiogram pending. Rate-controlled on IV diltiazem. Start Coreg and discontinue IV diltiazem. Poor anticoagulation candidate secondary to transfusion-dependent myeloproliferative disorder. Continue aspirin. No need to consult cardiology at this point. Repeat EKG 8/12--aflutter with variable A-V block, non-specific ST changes (independent review). 2. Volume overload--Change Lasix to oral. Daily weights, I&O. History most suggestive of tachycardia-induced overload rather than new diagnosis of CHF. Follow-up 2-d echocardiogram.  3. Subacute delirium--by history present ~1 week, family suspects secondary to pain medication. No history of dementia. Recognizes family, but complains of hallucinations. 4. 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 5. Myeloproliferative disorder--hemoglobin stable. Transfuse for Hgb ~6. Dr. Clelia Croft notified of admission. 6. Diabetes mellitus--diet controlled? CBG well-controlled. No need for SSI. 7. Hypothyroidism--TSH normal. Continue  Code Status: full code Family Communication: discussed with son, 3 daughters, girlfriend at bedside Disposition Plan: home when improved, anticipate 1-2 days  Brendia Sacks, MD  [facility] Team 4 Pager [phone]. If 8PM-8AM, please contact night-coverage at [email or web address], password [facility] 06/30/2012, 10:57 AM  LOS: 1 day   Brief narrative: 78 year old man with history of myeloproliferative disorder (transfusion dependent) and hypothyroidism brought to the hospital by his family because of progressive worsening of shortness of breath for the past several weeks. He thought initially it was secondary to his profound anemia. 2 days ago he called his oncologist, and he received transfusion of 2 units of  packed RBCs but his symptoms did not resolve, his daughter noticed that he sleeps sitting up, he also has some swelling in his legs so she brought him to the hospital for further evaluation. Upon initial evaluation in the emergency department he has proBNP of over 7800 and his chest x-ray showed bibasilar opacities consistent with acute CHF. He also has heart rate of 150, it was thought initially to be SVT, adenosine was given and it looks like a flutter now.  Consultants:  none  Procedures:  2-d echocardiogram 8/12--  HPI/Subjective: Resting after Ativan last night, then became agitated in the afternoon.  Objective: Filed Vitals:   06/30/12 0645 06/30/12 0806 06/30/12 0847 06/30/12 1014  BP: 116/52 99/49 101/56 99/53  Pulse: 95 102 85 85  Temp:      TempSrc:      Resp: 20     SpO2: 97% 94% 95% 93%    Intake/Output Summary (Last 24 hours) at 06/30/12 1057 Last data filed at 06/30/12 0645  Gross per 24 hour  Intake      0 ml  Output    100 ml  Net   -100 ml   Wt Readings from Last 3 Encounters:  05/30/12 82.645 kg (182 lb 3.2 oz)  04/18/12 83.779 kg (184 lb 11.2 oz)  02/15/12 84.052 kg (185 lb 4.8 oz)    Exam:   General:  Appears calm and comfortable.  Cardiovascular: RRR, no m/r/g. 1+ bilateral LE edema  Respiratory: CTA bilaterally. No w/r/r. Normal respiratory effort.  Psychiatric: unable to assess, somnolent.  Data Reviewed: Basic Metabolic Panel:  Lab 06/29/12 8295  NA 139  K 4.0  CL 105  CO2 23  GLUCOSE 101*  BUN 18  CREATININE 0.96  CALCIUM 8.7  MG 2.1  PHOS --   Liver Function Tests:  Lab 06/29/12 2215  AST 15  ALT 12  ALKPHOS 72  BILITOT 0.4  PROT 6.8  ALBUMIN 2.9*   CBC:  Lab 06/29/12 2200 06/27/12 0841  WBC 4.7 3.9*  NEUTROABS -- 2.5  HGB 7.7* 7.3*  HCT 24.7* 23.6*  MCV 93.2 92.2  PLT 353 345   Cardiac Enzymes:  Lab 06/30/12 0320  CKTOTAL 58  CKMB 3.9  CKMBINDEX --  TROPONINI <0.30     Basename 06/29/12 2200    PROBNP 7889.0*   CBG:  Lab 06/29/12 2155  GLUCAP 94    Recent Results (from the past 240 hour(s))  TECHNOLOGIST REVIEW     Status: Normal   Collection Time   06/27/12  8:41 AM      Component Value Range Status Comment   Technologist Review rare blast   Final      Studies: Chest Portable 1 View  06/29/2012  *RADIOLOGY REPORT*  Clinical Data: Shortness of breath, cough  PORTABLE CHEST - 1 VIEW  Comparison: 12/24/2006  Findings: Low lung volumes.  Small bilateral pleural effusions. Patchy bibasilar consolidation or atelectasis.  Mild diffuse interstitial edema/infiltrates.  Heart size upper limits normal for technique.  IMPRESSION:  1.  Bibasilar infiltrates or edema with small effusions.  Original Report Authenticated By: Thora Lance III, M.D.    Scheduled Meds:   . adenosine (ADENOCARD) IV  12 mg Intravenous Once  . adenosine (ADENOCARD) IV  6 mg Intravenous Once  . aspirin EC  81 mg Oral QPC breakfast  . carvedilol  6.25 mg Oral BID  . diltiazem  10 mg Intravenous Once  . diltiazem  15 mg Intravenous Once  . diltiazem (CARDIZEM) infusion  5-15 mg/hr Intravenous Once  . febuxostat  40 mg Oral Daily  . furosemide  40 mg Intravenous Q8H  . furosemide  60 mg Intravenous Once  . heparin  5,000 Units Subcutaneous Q8H  . levothyroxine  88 mcg Oral Daily  . LORazepam  0.5 mg Intravenous Once  . losartan  100 mg Oral Daily  . multivitamin with minerals  1 tablet Oral Daily  . potassium chloride  20 mEq Oral BID  . sodium chloride  3 mL Intravenous Q12H  . DISCONTD: diltiazem  10 mg Intravenous Once   Continuous Infusions:   . diltiazem (CARDIZEM) infusion 15 mg/hr (06/30/12 0804)    Principal Problem:  *Acute CHF Active Problems:  Anemia  Myeloproliferative disorder  Shortness of breath  Atrial flutter     Brendia Sacks, MD  [facility] Team 4 Pager [phone]. If 8PM-8AM, please contact night-coverage at [email or web address], password [facility] 06/30/2012, 10:57  AM  LOS: 1 day   Time spent: 20 minutes

## 2012-06-30 NOTE — ED Notes (Addendum)
Patient request to urine by standing at the side of bed. Nurse tech will assist . HR rate remains in 150s occasionally going down into 120s-130s nonsustained. Will cont to moitor increased cardizem drip at 2300 to 10mg /hr.

## 2012-06-30 NOTE — Progress Notes (Signed)
Spoke with Dr. Aldine Contes- Lenise Arena reported A Fib and most current heart rate 99-130's, and BP maintaining. No new orders.

## 2012-06-30 NOTE — Progress Notes (Signed)
Pt had 7 bts of PVC.  Pt asymptomatic, vitals stable, MD notified, orders obtained and carried out, will continue to monitor.

## 2012-07-01 NOTE — Progress Notes (Signed)
Occupational Therapy Note Spoke to nursing and see pt's HR elevated. Will hold off on OT eval and check back tomorrow. Judithann Sauger OTR/L [phone] 07/01/2012

## 2012-07-01 NOTE — Progress Notes (Signed)
PT Cancellation Note  Treatment cancelled today due to medical issues with patient which prohibited therapy.  Pt with elevated HR this morning and started on cardizem drip.  HR still variable this afternoon so will hold evaluation today and check back tomorrow.  Lorielle Boehning,KATHrine E 07/01/2012, 2:30 PM Pager: [phone]

## 2012-07-01 NOTE — Progress Notes (Signed)
SPOKE TO [facility]TR/FAMILY ABOUT D/C [facility].PROVIDED W/HHC AGENCY LIST,PRIVATE SITTER LIST.PLEASANTLY DECLINE SNF @ THIS TIME.

## 2012-07-01 NOTE — Progress Notes (Signed)
[facility] PROGRESS NOTE  Alvin Diffee MWU:[id number] DOB: [birth year] DOA: 06/29/2012 PCP: Laurena Slimmer, MD Oncologist: Earl Gala, MD  Assessment/Plan: 1. New onset atrial flutter--Uncontrolled again (despite Coreg). Resume Cardizem infusion. TSH normal, echocardiogram noted. Not a candidate for anticoagulation candidate secondary to transfusion-dependent myeloproliferative disorder (discussed with Dr. Clelia Croft). Continue aspirin.  2. Volume overload--Continue oral Lasix. Daily weights, I&O. History most suggestive of tachycardia-induced overload rather than diastolic CHF.  3. Subacute delirium--much improved today. By history present ~1 week, family suspects secondary to pain medication. No history of dementia. Recognizes family, but complains of hallucinations. Urinalysis and CXR not suggestive of infection but will check urine culture. 4. Anemia, Myeloproliferative disorder--hemoglobin stable. Transfuse for Hgb ~6. Dr. Clelia Croft notified of admission. 5. Diabetes mellitus--diet controlled? CBG well-controlled. No need for SSI. 6. Hypothyroidism--TSH normal. Continue levothyroxine.  Code Status: full code Family Communication: discussed with son at bedside Disposition Plan: home when improved with [facility]  Brendia Sacks, MD  [facility] Team 4 Pager [phone]. If 8PM-8AM, please contact night-coverage at [email or web address], password [facility] 07/01/2012, 2:32 PM  LOS: 2 days   Brief narrative: 78 year old man with history of myeloproliferative disorder (transfusion dependent) and hypothyroidism brought to the hospital by his family because of progressive worsening of shortness of breath for the past several weeks. Found to have atrial flutter with RVR. History of subacute delirium x1 week, family thought secondary to narcotics.  Consultants:  none  Procedures:  2-d echocardiogram 8/12--LVEF 50-55%, grade 1 diastolic dysfunction.  HPI/Subjective: No complaints. Family at  bedside--patient awake and less confused today.  Objective: Filed Vitals:   07/01/12 1020 07/01/12 1045 07/01/12 1157 07/01/12 1305  BP: 114/72 114/72 114/67 106/53  Pulse: 147 146 103 102  Temp:    97.2 F (36.2 C)  TempSrc:    Oral  Resp:  16  16  Height:      Weight:      SpO2:    91%    Intake/Output Summary (Last 24 hours) at 07/01/12 1432 Last data filed at 07/01/12 1300  Gross per 24 hour  Intake 2139.58 ml  Output   2150 ml  Net -10.42 ml   Wt Readings from Last 3 Encounters:  07/01/12 78.3 kg (172 lb 9.9 oz)  05/30/12 82.645 kg (182 lb 3.2 oz)  04/18/12 83.779 kg (184 lb 11.2 oz)    Exam:   General:  Appears calm and comfortable. Speech fluent and clear. More awake today. Able to participate in exam today.  Cardiovascular: RRR, no m/r/g. 1+ bilateral LE edema  Telemetry: atrial flutter, rate 140s-150s.  Respiratory: CTA bilaterally. No w/r/r. Normal respiratory effort.  Neurology: appears non-focal.  Data Reviewed: Basic Metabolic Panel:  Lab 06/29/12 4034  NA 139  K 4.0  CL 105  CO2 23  GLUCOSE 101*  BUN 18  CREATININE 0.96  CALCIUM 8.7  MG 2.1  PHOS --   Liver Function Tests:  Lab 06/29/12 2215  AST 15  ALT 12  ALKPHOS 72  BILITOT 0.4  PROT 6.8  ALBUMIN 2.9*   CBC:  Lab 06/29/12 2200 06/27/12 0841  WBC 4.7 3.9*  NEUTROABS -- 2.5  HGB 7.7* 7.3*  HCT 24.7* 23.6*  MCV 93.2 92.2  PLT 353 345   Cardiac Enzymes:  Lab 06/30/12 1910 06/30/12 1045 06/30/12 0320  CKTOTAL 37 51 58  CKMB 3.0 3.8 3.9  CKMBINDEX -- -- --  TROPONINI <0.30 <0.30 <0.30     Basename 06/29/12 2200  PROBNP 7889.0*   CBG:  Lab 06/29/12 2155  GLUCAP 94    Recent Results (from the past 240 hour(s))  TECHNOLOGIST REVIEW     Status: Normal   Collection Time   06/27/12  8:41 AM      Component Value Range Status Comment   Technologist Review rare blast   Final      Studies:  Scheduled Meds:    . aspirin EC  81 mg Oral QPC breakfast  .  carvedilol  6.25 mg Oral BID  . febuxostat  40 mg Oral Daily  . feeding supplement  237 mL Oral QAC supper  . furosemide  40 mg Oral BID  . heparin  5,000 Units Subcutaneous Q8H  . levothyroxine  88 mcg Oral Daily  . LORazepam  0.5 mg Intravenous Once  . losartan  100 mg Oral Daily  . multivitamin with minerals  1 tablet Oral Daily  . potassium chloride  20 mEq Oral BID  . sodium chloride  3 mL Intravenous Q12H   Continuous Infusions:    . diltiazem (CARDIZEM) infusion 15 mg/hr (07/01/12 1205)    Principal Problem:  *Atrial flutter Active Problems:  Anemia  Myeloproliferative disorder  Shortness of breath  Volume overload  Subacute delirium  Hypothyroidism     Brendia Sacks, MD  [facility] Team 4 Pager [phone]. If 8PM-8AM, please contact night-coverage at [email or web address], password [facility] 07/01/2012, 2:32 PM  LOS: 2 days   Time spent: 15 minutes

## 2012-07-02 ENCOUNTER — Inpatient Hospital Stay (HOSPITAL_COMMUNITY): Payer: Medicare Other

## 2012-07-02 ENCOUNTER — Encounter (HOSPITAL_COMMUNITY): Payer: Self-pay | Admitting: Radiology

## 2012-07-02 LAB — BASIC METABOLIC PANEL
BUN: 28 mg/dL — ABNORMAL HIGH (ref 6–23)
Chloride: 109 mEq/L (ref 96–112)
GFR calc Af Amer: 67 mL/min — ABNORMAL LOW (ref >90)
GFR calc non Af Amer: 58 mL/min — ABNORMAL LOW (ref >90)
Potassium: 4.7 mEq/L (ref 3.5–5.1)

## 2012-07-02 LAB — CBC
HCT: 28.4 % — ABNORMAL LOW (ref 39.0–52.0)
Hemoglobin: 9 g/dL — ABNORMAL LOW (ref 13.0–17.0)
MCHC: 31.7 g/dL (ref 30.0–36.0)
RDW: 22.3 % — ABNORMAL HIGH (ref 11.5–15.5)
WBC: 5 10*3/uL (ref 4.0–10.5)

## 2012-07-02 MED ORDER — DILTIAZEM HCL 60 MG PO TABS
60.0000 mg | ORAL_TABLET | Freq: Four times a day (QID) | ORAL | Status: DC
  Administered 2012-07-02 – 2012-07-04 (×7): 60 mg via ORAL
  Filled 2012-07-02 (×11): qty 1

## 2012-07-02 MED ORDER — DILTIAZEM HCL 60 MG PO TABS
60.0000 mg | ORAL_TABLET | Freq: Three times a day (TID) | ORAL | Status: DC
  Administered 2012-07-02: 60 mg via ORAL
  Filled 2012-07-02 (×3): qty 1

## 2012-07-02 MED ORDER — LOSARTAN POTASSIUM 50 MG PO TABS
50.0000 mg | ORAL_TABLET | Freq: Every day | ORAL | Status: DC
  Administered 2012-07-03 – 2012-07-07 (×5): 50 mg via ORAL
  Filled 2012-07-02 (×5): qty 1

## 2012-07-02 MED ORDER — CARVEDILOL 6.25 MG PO TABS
6.2500 mg | ORAL_TABLET | Freq: Two times a day (BID) | ORAL | Status: DC
  Administered 2012-07-02 – 2012-07-04 (×4): 6.25 mg via ORAL
  Filled 2012-07-02 (×6): qty 1

## 2012-07-02 NOTE — Progress Notes (Signed)
Per Dr. Susie Cassette, she would like to wean Cardizem drip. Pt was given 60 mg Cardizem p.o. At 1305, and drip rate was turned down to 10mg /hr. Will reassess drip in on hour. Will continue to monitor pt.

## 2012-07-02 NOTE — Progress Notes (Addendum)
[facility] PROGRESS NOTE  Kyle Heath ZOX:[id number] DOB: 03/08/1927 DOA: 06/29/2012 PCP: Laurena Slimmer, MD  Assessment/Plan: Principal Problem:  *Atrial flutter Active Problems:  Anemia  Myeloproliferative disorder  Shortness of breath  Volume overload  Subacute delirium  Hypothyroidism  New onset atrial flutter--Uncontrolled again (despite Coreg). Resumed Cardizem infusion yesterday. Coreg are discontinued for some unclear reason. Resume Coreg resumed by mouth diltiazem. Taper off diltiazem drip. TSH normal, echocardiogram noted. Not a candidate for anticoagulation candidate secondary to transfusion-dependent myeloproliferative disorder (discussed with Dr. Clelia Croft). I also discussed this with the patient's daughter was currently by the bedside, Continue aspirin.   Volume overload--Continue oral Lasix. Daily weights, I&O. History most suggestive of tachycardia-induced overload rather than diastolic CHF.   Subacute delirium--much improved today. By history present ~1 week, family suspects secondary to pain medication. No history of dementia. Recognizes family, but complains of hallucinations. Urinalysis and CXR not suggestive of infection but will check urine culture. CT head without contrast suggestive of subacute CVA. No further workup will be done at the management will not change. Patient is not a candidate for anticoagulation     Anemia, Myeloproliferative disorder--hemoglobin stable. Transfuse for Hgb ~6. Dr. Clelia Croft notified of admission.     Diabetes mellitus--diet controlled? CBG well-controlled. No need for SSI.   Hypothyroidism--TSH normal. Continue levothyroxine   Code Status: full Family Communication: family updated about patient's clinical progress Disposition Plan:  As above    Brief narrative:  78 year old man with history of myeloproliferative disorder (transfusion dependent) and hypothyroidism brought to the hospital by his family because of  progressive worsening of shortness of breath for the past several weeks. Found to have atrial flutter with RVR. History of subacute delirium x1 week, family thought secondary to narcotics.  Consultants:  none Procedures:  2-d echocardiogram 8/12--LVEF 50-55%, grade 1 diastolic dysfunction.  HPI/Subjective:  No complaints. Family at bedside--patient awake and less confused today.  Objective: Filed Vitals:   07/02/12 0915 07/02/12 0947 07/02/12 1307 07/02/12 1413  BP:  106/61 116/57 107/65  Pulse: 115 88 110 78  Temp:   98 F (36.7 C)   TempSrc:      Resp:   15   Height:      Weight:      SpO2:   95%     Intake/Output Summary (Last 24 hours) at 07/02/12 1421 Last data filed at 07/02/12 1200  Gross per 24 hour  Intake 684.83 ml  Output    650 ml  Net  34.83 ml    Exam:  General: Appears calm and comfortable. Speech fluent and clear. More awake today. Able to participate in exam today. Cardiovascular: RRR, no m/r/g. 1+ bilateral LE edema Telemetry: atrial flutter, rate 140s-150s. Respiratory: CTA bilaterally. No w/r/r. Normal respiratory effort. Neurology: appears non-focal   Data Reviewed: Basic Metabolic Panel:  Lab 07/02/12 8119 06/29/12 2215  NA 143 139  K 4.7 4.0  CL 109 105  CO2 22 23  GLUCOSE 110* 101*  BUN 28* 18  CREATININE 1.14 0.96  CALCIUM 8.7 8.7  MG -- 2.1  PHOS -- --    Liver Function Tests:  Lab 06/29/12 2215  AST 15  ALT 12  ALKPHOS 72  BILITOT 0.4  PROT 6.8  ALBUMIN 2.9*   No results found for this basename: LIPASE:5,AMYLASE:5 in the last 168 hours No results found for this basename: AMMONIA:5 in the last 168 hours  CBC:  Lab 07/02/12 0459 06/29/12 2200 06/27/12 0841  WBC 5.0 4.7 3.9*  NEUTROABS -- -- 2.5  HGB 9.0* 7.7* 7.3*  HCT 28.4* 24.7* 23.6*  MCV 93.4 93.2 92.2  PLT 402* 353 345    Cardiac Enzymes:  Lab 06/30/12 1910 06/30/12 1045 06/30/12 0320  CKTOTAL 37 51 58  CKMB 3.0 3.8 3.9  CKMBINDEX -- -- --  TROPONINI <0.30  <0.30 <0.30   BNP (last 3 results)  Basename 06/29/12 2200  PROBNP 7889.0*     CBG:  Lab 06/29/12 2155  GLUCAP 94    Recent Results (from the past 240 hour(s))  TECHNOLOGIST REVIEW     Status: Normal   Collection Time   06/27/12  8:41 AM      Component Value Range Status Comment   Technologist Review rare blast   Final      Studies: Ct Head Wo Contrast  07/02/2012  *RADIOLOGY REPORT*  Clinical Data: Confusion.  Evaluate for potential CVA.  CT HEAD WITHOUT CONTRAST  Technique:  Contiguous axial images were obtained from the base of the skull through the vertex without contrast.  Comparison: No priors.  Findings: Mild cerebral and cerebellar atrophy is age appropriate. There are patchy and confluent areas of decreased attenuation throughout the deep and periventricular white matter of the cerebral hemispheres bilaterally, most compatible with chronic microvascular ischemic disease.  In the left subinsular region there is a slightly ill-defined area of decreased attenuation which could be concerning for an area of subacute ischemia.  No acute intracranial hemorrhage, focal mass, mass effect, hydrocephalus or abnormal intra or extra-axial fluid collections.  No acute displaced skull fractures are identified.  Visualized paranasal sinuses and mastoids are well pneumatized.  IMPRESSION: 1.  Ill-defined area of low attenuation in the left subinsular region could represent an area of age indeterminate (potentially subacute) ischemia.  This could be further delineated with MRI of the brain if clinically indicated. 2.  Mild cerebral and cerebellar atrophy is age appropriate. 3.  Chronic microvascular ischemic changes in the white matter of the brain, as above.  Original Report Authenticated By: Florencia Reasons, M.D.   Chest Portable 1 View  06/29/2012  *RADIOLOGY REPORT*  Clinical Data: Shortness of breath, cough  PORTABLE CHEST - 1 VIEW  Comparison: 12/24/2006  Findings: Low lung volumes.  Small  bilateral pleural effusions. Patchy bibasilar consolidation or atelectasis.  Mild diffuse interstitial edema/infiltrates.  Heart size upper limits normal for technique.  IMPRESSION:  1.  Bibasilar infiltrates or edema with small effusions.  Original Report Authenticated By: Thora Lance III, M.D.    Scheduled Meds:   . aspirin EC  81 mg Oral QPC breakfast  . carvedilol  6.25 mg Oral BID WC  . diltiazem  60 mg Oral Q8H  . febuxostat  40 mg Oral Daily  . feeding supplement  237 mL Oral QAC supper  . heparin  5,000 Units Subcutaneous Q8H  . levothyroxine  88 mcg Oral Daily  . LORazepam  0.5 mg Intravenous Once  . losartan  50 mg Oral Daily  . multivitamin with minerals  1 tablet Oral Daily  . potassium chloride  20 mEq Oral BID  . sodium chloride  3 mL Intravenous Q12H  . DISCONTD: carvedilol  6.25 mg Oral BID  . DISCONTD: losartan  100 mg Oral Daily   Continuous Infusions:   . diltiazem (CARDIZEM) infusion 5 mg/hr (07/02/12 1414)    Principal Problem:  *Atrial flutter Active Problems:  Anemia  Myeloproliferative disorder  Shortness of breath  Volume overload  Subacute delirium  Hypothyroidism  Time spent: 40 minutes   [facility]  [facility] Pager [phone]. If 8PM-8AM, please contact night-coverage at [email or web address], password [facility] 07/02/2012, 2:21 PM  LOS: 3 days

## 2012-07-02 NOTE — Evaluation (Addendum)
Occupational Therapy Evaluation Patient Details Name: Kyle Heath MRN: [id number] DOB: Feb 15, 1927 Today's Date: 07/02/2012 Time: 4098-1191 OT Time Calculation (min): 35 min  OT Assessment / Plan / Recommendation Clinical Impression  Kyle Heath is an 78 year old African American male with past medical history of myeloproliferative disorder, HTN, DM and hypothyroidism. He brought to the hospital by his family because of progressive worsening of shortness of breath. Pt displays decreased activity tolerance, strength, safety and functional mobility/ADL. Will benefit from skilled OT services to imrprove ADL independence.     OT Assessment  Patient needs continued OT Services    Follow Up Recommendations  Home health OT;Supervision/Assistance - 24 hour    Barriers to Discharge      Equipment Recommendations  3 in 1 bedside comode;Tub/shower seat;Other (comment) (tubseat if pt agreeable.)    Recommendations for Other Services    Frequency  Min 2X/week    Precautions / Restrictions Precautions Precautions: Fall Precaution Comments: monitor HR Restrictions Weight Bearing Restrictions: No        ADL  Eating/Feeding: Other (comment) (not assessed) Grooming: Simulated;Set up;Supervision/safety Where Assessed - Grooming: Unsupported sitting Upper Body Bathing: Simulated;Chest;Right arm;Left arm;Abdomen;Minimal assistance Where Assessed - Upper Body Bathing: Unsupported sitting Lower Body Bathing: Simulated;Moderate assistance;Other (comment) (unsteady in standing) Where Assessed - Lower Body Bathing: Supported sit to stand Upper Body Dressing: Simulated;Minimal assistance;Other (comment) (with gown) Where Assessed - Upper Body Dressing: Unsupported sitting Lower Body Dressing: Simulated;Moderate assistance Where Assessed - Lower Body Dressing: Supported sit to Pharmacist, hospital: Performed;Minimal Dentist Method: Other (comment) (transferred into bathroom without  device) Acupuncturist: Bedside commode Toileting - Clothing Manipulation and Hygiene: Simulated;Minimal assistance Where Assessed - Engineer, mining and Hygiene: Standing Tub/Shower Transfer Method: Not assessed Equipment Used: Rolling walker ADL Comments: Transferred into bathroom without device but pt unsteady especially with turns so used RW for out of bathroom and short walk in hallway also. Pt HR 115 after transfer into bathroom and sitting on commode. Daughter present for session. Pt doesnt verbalize much unless asked a question.     OT Diagnosis: Generalized weakness  OT Problem List: Decreased strength;Decreased activity tolerance;Decreased knowledge of use of DME or AE OT Treatment Interventions: Self-care/ADL training;Therapeutic activities;DME and/or AE instruction;Patient/family education   OT Goals Acute Rehab OT Goals OT Goal Formulation: With patient Time For Goal Achievement: 07/16/12 Potential to Achieve Goals: Good ADL Goals Pt Will Perform Grooming: with supervision;Standing at sink ADL Goal: Grooming - Progress: Goal set today Pt Will Perform Lower Body Bathing: with supervision;Sit to stand from chair;Sit to stand from bed ADL Goal: Lower Body Bathing - Progress: Goal set today Pt Will Perform Lower Body Dressing: with supervision;Sit to stand from chair;Sit to stand from bed ADL Goal: Lower Body Dressing - Progress: Goal set today Pt Will Transfer to Toilet: with supervision;with DME;Ambulation;3-in-1 ADL Goal: Toilet Transfer - Progress: Goal set today Pt Will Perform Toileting - Clothing Manipulation: with supervision;Standing ADL Goal: Toileting - Clothing Manipulation - Progress: Goal set today Pt Will Perform Tub/Shower Transfer: with min assist;with DME ADL Goal: Tub/Shower Transfer - Progress: Goal set today  Visit Information  Last OT Received On: 07/02/12 Assistance Needed: +1    Subjective Data  Subjective: pt acknowledged  PT/OT but doesnt verbalize much unless asked a question Patient Stated Goal: none stated. agreeable to work with therapy.   Prior Functioning  Vision/Perception  Home Living Lives With: Family Available Help at Discharge: Family;Available 24 hours/day Type of Home: House Home Access:  Stairs to enter [employer] of Steps: 4 Entrance Stairs-Rails: Left Home Layout: One level Bathroom Shower/Tub: Engineer, manufacturing systems: Handicapped height Home Adaptive Equipment: None Prior Function Level of Independence: Independent Communication Communication: No difficulties      Cognition  Overall Cognitive Status: Difficult to assess Arousal/Alertness: Awake/alert Cognition - Other Comments: Difficult to asssess. pt doesnt verbalize much unless asked a question. Sits on EOB with rounded shoulders, looking down often. Did answer basic questions when asked but otherwise pt is quiet. Followed simple commands.     Extremity/Trunk Assessment Right Upper Extremity Assessment RUE ROM/Strength/Tone: [facility] for tasks assessed Left Upper Extremity Assessment LUE ROM/Strength/Tone: WFL for tasks assessed Right Lower Extremity Assessment RLE ROM/Strength/Tone: [facility] for tasks assessed Left Lower Extremity Assessment LLE ROM/Strength/Tone: WFL for tasks assessed   Mobility Bed Mobility Bed Mobility: Supine to Sit Supine to Sit: 5: Supervision Details for Bed Mobility Assistance: verbal cues to wait for therapist to untangle lines/waist restraint Transfers Sit to Stand: 4: Min assist;From chair/3-in-1;From bed;With upper extremity assist Stand to Sit: 4: Min assist;To chair/3-in-1;With upper extremity assist Details for Transfer Assistance: verbal cues for safe technique   Exercise    Balance    End of Session OT - End of Session Equipment Utilized During Treatment: Gait belt Activity Tolerance: Patient limited by fatigue Patient left: in chair;with call bell/phone within  reach;with chair alarm set;with family/visitor present  GO     Kyle Heath [phone] 07/02/2012, 11:49 AM

## 2012-07-02 NOTE — Evaluation (Signed)
Physical Therapy Evaluation Patient Details Name: Kyle Heath MRN: [id number] DOB: 12-Aug-1927 Today's Date: 07/02/2012 Time: 8413-2440 PT Time Calculation (min): 35 min  PT Assessment / Plan / Recommendation Clinical Impression  Pt diagnosed with new onset atrial flutter, volume overload and subacute delirium.  Pt cooperative and followed simple commands during evaluation.  Pt would benefit from acute PT services in order to improve independence with mobility while monitoring HR to prepare for d/c home with family.    PT Assessment  Patient needs continued PT services    Follow Up Recommendations  Home health PT    Barriers to Discharge        Equipment Recommendations  3 in 1 bedside comode;Tub/shower seat;Other (comment) (tubseat if pt agreeable.)    Recommendations for Other Services     Frequency Min 3X/week    Precautions / Restrictions Precautions Precautions: Fall Precaution Comments: monitor HR Restrictions Weight Bearing Restrictions: No   Pertinent Vitals/Pain No pain      Mobility  Bed Mobility Bed Mobility: Supine to Sit Supine to Sit: 5: Supervision Details for Bed Mobility Assistance: verbal cues to wait for therapist to untangle lines/waist restraint Transfers Transfers: Stand to Sit;Sit to Stand Sit to Stand: 4: Min assist;From chair/3-in-1;From bed;With upper extremity assist Stand to Sit: 4: Min assist;To chair/3-in-1;With upper extremity assist Details for Transfer Assistance: verbal cues for safe technique Ambulation/Gait Ambulation/Gait Assistance: 4: Min assist Ambulation Distance (Feet): 50 Feet Assistive device: Rolling walker Ambulation/Gait Assistance Details: verbal cues for safe use of RW and posture, occasional assist for unsteady gait, fatigued quickly, HR 115 upon sitting on toilet prior to ambulating in hallway Gait Pattern: Step-through pattern;Decreased stride length;Trunk flexed Gait velocity: decreased    Exercises     PT  Diagnosis: Difficulty walking;Generalized weakness  PT Problem List: Decreased strength;Decreased activity tolerance;Decreased mobility;Decreased balance;Decreased safety awareness;Decreased knowledge of use of DME;Cardiopulmonary status limiting activity PT Treatment Interventions: DME instruction;Gait training;Stair training;Functional mobility training;Therapeutic activities;Therapeutic exercise;Balance training;Neuromuscular re-education;Patient/family education   PT Goals Acute Rehab PT Goals PT Goal Formulation: With patient Time For Goal Achievement: 07/09/12 Potential to Achieve Goals: Good Pt will go Sit to Stand: with supervision PT Goal: Sit to Stand - Progress: Goal set today Pt will go Stand to Sit: with supervision PT Goal: Stand to Sit - Progress: Goal set today Pt will Ambulate: >150 feet;with supervision;with least restrictive assistive device PT Goal: Ambulate - Progress: Goal set today Pt will Perform Home Exercise Program: with supervision, verbal cues required/provided PT Goal: Perform Home Exercise Program - Progress: Goal set today  Visit Information  Last PT Received On: 07/02/12 Assistance Needed: +1 PT/OT Co-Evaluation/Treatment: Yes    Subjective Data  Subjective: "I'm ok."   Prior Functioning  Home Living Lives With: Family Available Help at Discharge: Family;Available 24 hours/day Type of Home: House Home Access: Stairs to enter [employer] of Steps: 4 Entrance Stairs-Rails: Left Home Layout: One level Bathroom Shower/Tub: Engineer, manufacturing systems: Handicapped height Home Adaptive Equipment: None Prior Function Level of Independence: Independent Communication Communication: No difficulties    Cognition  Overall Cognitive Status: Difficult to assess Arousal/Alertness: Awake/alert Cognition - Other Comments: Difficult to asssess. pt doesnt verbalize much unless asked a question. Sits on EOB with rounded shoulders, looking down  often. Did answer basic questions when asked but otherwise pt is quiet. Followed simple commands.     Extremity/Trunk Assessment Right Upper Extremity Assessment RUE ROM/Strength/Tone: [facility] for tasks assessed Left Upper Extremity Assessment LUE ROM/Strength/Tone: [facility] for tasks assessed  Right Lower Extremity Assessment RLE ROM/Strength/Tone: [facility] for tasks assessed Left Lower Extremity Assessment LLE ROM/Strength/Tone: [facility] for tasks assessed   Balance    End of Session PT - End of Session Equipment Utilized During Treatment: Gait belt Activity Tolerance: Patient limited by fatigue Patient left: in chair;with call bell/phone within reach;with chair alarm set;with family/visitor present;with restraints reapplied Nurse Communication: Mobility status  GP     Kyle Heath,KATHrine E 07/02/2012, 11:50 AM Pager: [phone]

## 2012-07-02 NOTE — Progress Notes (Addendum)
Pt's HR sustaining between mid 70s-90s. Pt's VS remain stable. Cardizem drip was tapered down to 5mg /hr. Will continue to monitor pt.   At 1535, Cardizem drip was stopped. HR remained in the 80s. Pt's VS stable. Will continue to monitor pt.

## 2012-07-03 DIAGNOSIS — R0602 Shortness of breath: Secondary | ICD-10-CM

## 2012-07-03 DIAGNOSIS — I4892 Unspecified atrial flutter: Principal | ICD-10-CM

## 2012-07-03 LAB — CBC
HCT: 30.5 % — ABNORMAL LOW (ref 39.0–52.0)
Hemoglobin: 9.7 g/dL — ABNORMAL LOW (ref 13.0–17.0)
RBC: 3.27 MIL/uL — ABNORMAL LOW (ref 4.22–5.81)

## 2012-07-03 LAB — BASIC METABOLIC PANEL
Chloride: 109 mEq/L (ref 96–112)
GFR calc Af Amer: 68 mL/min — ABNORMAL LOW (ref >90)
GFR calc non Af Amer: 59 mL/min — ABNORMAL LOW (ref >90)
Glucose, Bld: 107 mg/dL — ABNORMAL HIGH (ref 70–99)
Potassium: 4.8 mEq/L (ref 3.5–5.1)
Sodium: 143 mEq/L (ref 135–145)

## 2012-07-03 MED ORDER — DILTIAZEM HCL 25 MG/5ML IV SOLN
10.0000 mg | Freq: Once | INTRAVENOUS | Status: AC
  Administered 2012-07-03: 10 mg via INTRAVENOUS
  Filled 2012-07-03: qty 5

## 2012-07-03 MED ORDER — LORAZEPAM 0.5 MG PO TABS
0.2500 mg | ORAL_TABLET | Freq: Once | ORAL | Status: AC
  Administered 2012-07-03: 0.25 mg via ORAL
  Filled 2012-07-03: qty 1

## 2012-07-03 NOTE — Progress Notes (Deleted)
Physical Therapy Note  Attempt for PT tx session this afternoon. Note pt is back on Cardizem drip and heart rate is still uncontrolled per MDs note today. Will hold PT tx session today. Will check back another day. Thanks. [name], PT [phone]

## 2012-07-03 NOTE — Progress Notes (Signed)
Physical Therapy Treatment Patient Details Name: Kyle Heath MRN: [id number] DOB: 10-20-1927 Today's Date: 07/03/2012 Time: 1345-1401 PT Time Calculation (min): 16 min  PT Assessment / Plan / Recommendation Comments on Treatment Session  Participated well with session. Noted some improvement in HR after activity-87 bpm @ rest, 99-105 bpm after activity. Pt continues to demonstrate poor activity tolerance-fatigues fairly easily with activity. Pt may need ST rehab at SNF to improve general strength,safety and activity tolerance-pt/family considering.     Follow Up Recommendations  Skilled nursing facility. (Home health PT with Supervision/Assistance - 24 hour if SNF not an option)   Barriers to Discharge        Equipment Recommendations  3 in 1 bedside comode;Tub/shower seat    Recommendations for Other Services    Frequency Min 3X/week   Plan Discharge plan needs to be updated    Precautions / Restrictions Precautions Precautions: Fall Restrictions Weight Bearing Restrictions: No   Pertinent Vitals/Pain     Mobility  Bed Mobility Bed Mobility: Supine to Sit;Sit to Supine Supine to Sit: 5: Supervision;HOB elevated;With rails Sit to Supine: 5: Supervision;HOB elevated;With rail Details for Bed Mobility Assistance: VCs safety, technique.  Transfers Transfers: Sit to Stand;Stand to Sit Sit to Stand: 4: Min guard;With upper extremity assist;From bed Stand to Sit: 4: Min guard;With upper extremity assist;To bed Details for Transfer Assistance: VCs safety, hand placement. Pt tends to push walker too far ahead and pull up on walker.  Ambulation/Gait Ambulation/Gait Assistance: 4: Min assist Ambulation Distance (Feet): 70 Feet Assistive device: Rolling walker Ambulation/Gait Assistance Details: VCs safety, direction, distance from RW. Assist to stabilize throughout ambulation and maneuver with RW. 1-2 instances of instability requiring external assist to correct. HR 99-105 bpm  after ambulation. Pt fatigues easily.  Gait Pattern: Step-through pattern;Trunk flexed;Decreased stride length    Exercises     PT Diagnosis:    PT Problem List:   PT Treatment Interventions:     PT Goals Acute Rehab PT Goals Pt will go Sit to Stand: with supervision PT Goal: Sit to Stand - Progress: Progressing toward goal Pt will go Stand to Sit: with supervision PT Goal: Stand to Sit - Progress: Progressing toward goal Pt will Ambulate: >150 feet;with least restrictive assistive device PT Goal: Ambulate - Progress: Progressing toward goal  Visit Information  Last PT Received On: 07/03/12 Assistance Needed: +1    Subjective Data  Subjective: "I'm cold" Patient Stated Goal: None stated   Cognition  Overall Cognitive Status: Difficult to assess Cognition - Other Comments: Follows simple commands and participates well. Little to no eye contact and conversation.     Balance     End of Session PT - End of Session Equipment Utilized During Treatment: Gait belt Activity Tolerance: Patient limited by fatigue Patient left: in bed;with call bell/phone within reach;with family/visitor present   GP     [name] [facility] 07/03/2012, 2:16 PM [phone]

## 2012-07-03 NOTE — Evaluation (Signed)
Clinical/Bedside Swallow Evaluation Patient Details  Name: Kyle Heath MRN: [id number] Date of Birth: 21-Mar-1927  Today's Date: 07/03/2012 Time: 1631-1710 SLP Time Calculation (min): 39 min  Past Medical History:  Past Medical History  Diagnosis Date  . Diabetes mellitus   . Anemia   . Hypothyroidism   . Hypertension   . Myeloproliferative disorder 06/30/2012   Past Surgical History: History reviewed. No pertinent past surgical history. HPI:  78 yo male adm to hospital with sob, progressive weakness.  PMH + for anemia, hypothyroidism, myeloproliferation d/o-transfusion dependent.  Pt experienced subacute delirium, family suspected d/t pain medications pt had been taking x 3 weeks.  Pt CXR showed atrophy 07/02/12,  CXR bilateral edema or infiltrate with bilateral pleural effusions.  Pt denies dysphagia currently but does acknowledge an isolated incident of getting choked at home, family states it was on coffee. Weight loss of 50 punds reported due to decr appetitie, pt receiving stimulant at home that family states is helpful.    Assessment / Plan / Recommendation Clinical Impression  Pt observed with minimal po - he would only accept two bites of mashed potatoes, a single bite of roll, and a spoon full of icecream with pill that nurse gave pt.  Delayed swallow observed but no clinical s/s of aspiration noted.  Pt adamently refused to accept liquids of any kind, even after family tried to encourage him.  Isolated choking episode with coffee reported to occur at home (prior to admission).  Pt does acknowledge symptoms of reflux at home for which he takes Pepcid per family.  Cognition impairs accuracy of information pt provides. SLP to return next date to observe pt with liquids but do not suspect pt is overtly aspirating.  Educated pt family to aspiration precautions and they verbalized understanding.  Asked family to bring in dentures for pt to use, as he wears uppers to eat at home.  Ground  meats ordered.     Aspiration Risk  Mild    Diet Recommendation Dysphagia 3 (Mechanical Soft) (asked family to order soft items, ground meats)   Medication Administration: Whole meds with puree Supervision: Patient able to self feed;Intermittent supervision to cue for compensatory strategies Compensations: Check for pocketing Postural Changes and/or Swallow Maneuvers: Seated upright 90 degrees;Upright 30-60 min after meal    Other  Recommendations     Follow Up Recommendations       Frequency and Duration min 1 x/week  1 week   Pertinent Vitals/Pain Afebrile, decreased    SLP Swallow Goals Patient will utilize recommended strategies during swallow to increase swallowing safety with: Maximal cueing   Swallow Study Prior Functional Status       General Date of Onset: 07/03/12 HPI: 78 yo male adm to hospital with sob, progressive weakness.  PMH + for anemia, hypothyroidism, myeloproliferation d/o-transfusion dependent.  Pt experienced subacute delirium, family suspected d/t pain medications pt had been taking x 3 weeks.  Pt CXR showed atrophy 07/02/12,  CXR bilateral edema or infiltrate with bilateral pleural effusions.  Pt denies dysphagia currently but does acknowledge an isolated incident of getting choked at home, family states it was on coffee. Weight loss of 50 punds reported due to decr appetitie, pt receiving stimulant at home that family states is helpful.  Previous Swallow Assessment: none Diet Prior to this Study: Regular;Thin liquids Temperature Spikes Noted: No Respiratory Status: Supplemental O2 delivered via (comment) History of Recent Intubation: No Behavior/Cognition: Alert;Requires cueing;Distractible;Decreased sustained attention;Impulsive Oral Cavity - Dentition: Edentulous (uses top dentures  only-lowers loose due to weight loss) Patient Positioning:  (sitting at EOB, c/o being cold) Baseline Vocal Quality: Clear Volitional Cough: Weak Volitional Swallow:  Unable to elicit    Oral/Motor/Sensory Function Overall Oral Motor/Sensory Function: Appears within functional limits for tasks assessed   Ice Chips Ice chips: Not tested   Thin Liquid Thin Liquid: Not tested Other Comments: pt declined    Nectar Thick Nectar Thick Liquid: Not tested Other Comments: pt declined to accept   Honey Thick Other Comments: pt declined to accept   Puree Puree: Impaired Presentation: Self Fed;Spoon Pharyngeal Phase Impairments: Suspected delayed Swallow (minimal delay) Other Comments: mashed potatoes, small bolus   Solid   GO    Solid: Within functional limits, slow mastication - pt edentulous Presentation: Self Fed Other Comments: soft roll       Donavan Burnet, MS [facility] SLP [phone]

## 2012-07-03 NOTE — Progress Notes (Signed)
[facility] PROGRESS NOTE  Abhiram Criado ZOX:[id number] DOB: [birth year] DOA: 06/29/2012 PCP: Laurena Slimmer, MD  Assessment/Plan: Principal Problem:  *Atrial flutter Active Problems:  Anemia  Myeloproliferative disorder  Shortness of breath  Volume overload  Subacute delirium  Hypothyroidism  New onset atrial flutter--Uncontrolled again (despite Coreg). Resumed Cardizem infusion yesterday. Coreg are discontinued for some unclear reason. Resume Coreg resumed by mouth diltiazem. Taper off diltiazem drip. TSH normal, echocardiogram noted. Not a candidate for anticoagulation candidate secondary to transfusion-dependent myeloproliferative disorder (discussed with Dr. Clelia Croft). I also discussed this with the patient's daughter was currently by the bedside, Continue aspirin. Cardiology consulted. No further recommendations.   Volume overload--Continue oral Lasix. Daily weights, I&O. History most suggestive of tachycardia-induced overload rather than diastolic CHF.   Subacute delirium--much improved today. By history present ~1 week, family suspects secondary to pain medication. No history of dementia. Recognizes family, but complains of hallucinations. Urinalysis and CXR not suggestive of infection but will check urine culture. CT head without contrast suggestive of subacute CVA. No further workup will be done at the management will not change. Patient is not a candidate for anticoagulation PT/OT/speech   Anemia, Myeloproliferative disorder--hemoglobin stable. Transfuse for Hgb ~6. Dr. Clelia Croft notified of admission.  Diabetes mellitus--diet controlled? CBG well-controlled. No need for SSI.  Hypothyroidism--TSH normal. Continue levothyroxine     Code Status: full  Family Communication: family updated about patient's clinical progress  Disposition Plan: Likely needs SNF discussed with wife. Patient is to consider SNF placement. Would appear to be a candidate for palliative care consult in  near future    Brief narrative:  78 year old man with history of myeloproliferative disorder (transfusion dependent) and hypothyroidism brought to the hospital by his family because of progressive worsening of shortness of breath for the past several weeks. Found to have atrial flutter with RVR. History of subacute delirium x1 week, family thought secondary to narcotics.  Consultants:  none Procedures:  2-d echocardiogram 8/12--LVEF 50-55%, grade 1 diastolic dysfunction.  HPI/Subjective:  No complaints. Family at bedside--patient awake and less confused today.   Objective: Filed Vitals:   07/02/12 1615 07/02/12 1815 07/02/12 2250 07/03/12 0604  BP: 109/57 120/51 134/74 129/81  Pulse: 82  99 142  Temp:   98.2 F (36.8 C) 98 F (36.7 C)  TempSrc:   Oral Oral  Resp:      Height:      Weight:    76.613 kg (168 lb 14.4 oz)  SpO2:   93% 98%    Intake/Output Summary (Last 24 hours) at 07/03/12 1215 Last data filed at 07/03/12 0358  Gross per 24 hour  Intake    120 ml  Output    375 ml  Net   -255 ml    Exam:  General: Appears calm and comfortable. Speech fluent and clear. More awake today. Able to participate in exam today. Cardiovascular: RRR, no m/r/g. 1+ bilateral LE edema Telemetry: atrial flutter, rate 140s-150s. Respiratory: CTA bilaterally. No w/r/r. Normal respiratory effort. Neurology: appears non-focal   Data Reviewed: Basic Metabolic Panel:  Lab 07/03/12 8119 07/02/12 0459 06/29/12 2215  NA 143 143 139  K 4.8 4.7 4.0  CL 109 109 105  CO2 26 22 23   GLUCOSE 107* 110* 101*  BUN 29* 28* 18  CREATININE 1.13 1.14 0.96  CALCIUM 9.0 8.7 8.7  MG -- -- 2.1  PHOS -- -- --    Liver Function Tests:  Lab 06/29/12 2215  AST 15  ALT 12  ALKPHOS 72  BILITOT 0.4  PROT 6.8  ALBUMIN 2.9*   No results found for this basename: LIPASE:5,AMYLASE:5 in the last 168 hours No results found for this basename: AMMONIA:5 in the last 168 hours  CBC:  Lab 07/03/12 0433  07/02/12 0459 06/29/12 2200 06/27/12 0841  WBC 5.4 5.0 4.7 3.9*  NEUTROABS -- -- -- 2.5  HGB 9.7* 9.0* 7.7* 7.3*  HCT 30.5* 28.4* 24.7* 23.6*  MCV 93.3 93.4 93.2 92.2  PLT 363 402* 353 345    Cardiac Enzymes:  Lab 06/30/12 1910 06/30/12 1045 06/30/12 0320  CKTOTAL 37 51 58  CKMB 3.0 3.8 3.9  CKMBINDEX -- -- --  TROPONINI <0.30 <0.30 <0.30   BNP (last 3 results)  Basename 06/29/12 2200  PROBNP 7889.0*     CBG:  Lab 06/29/12 2155  GLUCAP 94    Recent Results (from the past 240 hour(s))  TECHNOLOGIST REVIEW     Status: Normal   Collection Time   06/27/12  8:41 AM      Component Value Range Status Comment   Technologist Review rare blast   Final   URINE CULTURE     Status: Normal (Preliminary result)   Collection Time   07/01/12  8:53 PM      Component Value Range Status Comment   Specimen Description URINE, RANDOM   Final    Special Requests NONE   Final    Culture  Setup Time 07/02/2012 02:12   Final    Colony Count PENDING   Incomplete    Culture Culture reincubated for better growth   Final    Report Status PENDING   Incomplete      Studies: Ct Head Wo Contrast  07/02/2012  *RADIOLOGY REPORT*  Clinical Data: Confusion.  Evaluate for potential CVA.  CT HEAD WITHOUT CONTRAST  Technique:  Contiguous axial images were obtained from the base of the skull through the vertex without contrast.  Comparison: No priors.  Findings: Mild cerebral and cerebellar atrophy is age appropriate. There are patchy and confluent areas of decreased attenuation throughout the deep and periventricular white matter of the cerebral hemispheres bilaterally, most compatible with chronic microvascular ischemic disease.  In the left subinsular region there is a slightly ill-defined area of decreased attenuation which could be concerning for an area of subacute ischemia.  No acute intracranial hemorrhage, focal mass, mass effect, hydrocephalus or abnormal intra or extra-axial fluid collections.  No  acute displaced skull fractures are identified.  Visualized paranasal sinuses and mastoids are well pneumatized.  IMPRESSION: 1.  Ill-defined area of low attenuation in the left subinsular region could represent an area of age indeterminate (potentially subacute) ischemia.  This could be further delineated with MRI of the brain if clinically indicated. 2.  Mild cerebral and cerebellar atrophy is age appropriate. 3.  Chronic microvascular ischemic changes in the white matter of the brain, as above.  Original Report Authenticated By: Florencia Reasons, M.D.   Chest Portable 1 View  06/29/2012  *RADIOLOGY REPORT*  Clinical Data: Shortness of breath, cough  PORTABLE CHEST - 1 VIEW  Comparison: 12/24/2006  Findings: Low lung volumes.  Small bilateral pleural effusions. Patchy bibasilar consolidation or atelectasis.  Mild diffuse interstitial edema/infiltrates.  Heart size upper limits normal for technique.  IMPRESSION:  1.  Bibasilar infiltrates or edema with small effusions.  Original Report Authenticated By: Thora Lance III, M.D.    Scheduled Meds:   . aspirin EC  81 mg Oral QPC breakfast  . carvedilol  6.25 mg Oral BID WC  . diltiazem  10 mg Intravenous Once  . diltiazem  60 mg Oral Q6H  . febuxostat  40 mg Oral Daily  . feeding supplement  237 mL Oral QAC supper  . heparin  5,000 Units Subcutaneous Q8H  . levothyroxine  88 mcg Oral Daily  . LORazepam  0.5 mg Intravenous Once  . losartan  50 mg Oral Daily  . multivitamin with minerals  1 tablet Oral Daily  . potassium chloride  20 mEq Oral BID  . sodium chloride  3 mL Intravenous Q12H  . DISCONTD: diltiazem  60 mg Oral Q8H   Continuous Infusions:   . DISCONTD: diltiazem (CARDIZEM) infusion Stopped (07/02/12 1535)    Principal Problem:  *Atrial flutter Active Problems:  Anemia  Myeloproliferative disorder  Shortness of breath  Volume overload  Subacute delirium  Hypothyroidism    Time spent: 40  minutes   [facility]  [facility] Pager [phone]. If 8PM-8AM, please contact night-coverage at [email or web address], password [facility] 07/03/2012, 12:15 PM  LOS: 4 days

## 2012-07-03 NOTE — Progress Notes (Signed)
Notified MD on call that pt.'s HR was sustaining in the 140's early this am.  Currently irregular and rate is anywhere between 110's to 140's but not sustaining. Patient did receive his am dose of Cardizem.  No new orders at this time will wait for Dr. Susie Cassette to make am rounds.  Manson Passey, [place] [facility] [place]

## 2012-07-03 NOTE — Consult Note (Signed)
CARDIOLOGY CONSULT NOTE    Patient ID: Kyle Heath MRN: [id number] DOB/AGE: 03-07-1927 78 y.o.  Admit date: 06/29/2012 Referring Physician:  Susie Cassette Primary Physician: Laurena Slimmer, MD Primary Cardiologist:  New Reason for Consultation:  Flutter  Principal Problem:  *Atrial flutter Active Problems:  Anemia  Myeloproliferative disorder  Shortness of breath  Volume overload  Subacute delirium  Hypothyroidism   HPI:  Debilitated 78 yo admitted to hospital for dyspnea and found to be in flutter.  No old ECG;s in MUSE.  No chest pain or palpitations.  Myloproliferative disorder.  Dyspnea, fatigue and failure to thrive since  March.  Requiring frequent transfusions and nonresponder to Epo.  Hct still low despite recent tranfusion at 24.7 on admission.  History of hypothyroidism TSH normal at 4 on admission.  No fevers, chest pain syncope and  Mild chronic LE edema.  Has has about a 30lb weight loss since March and poor appetite.  Rate control in hospital improved on cardizem and beta blocker.  Reviewed echo and EF low normal at 50-55%  Daughter lives with  Patient and indicates slow ongoing down hill process and has been sleeping upright in chair.  Difficulty lifting head up  @ROS @ All other systems reviewed and negative except as noted above  Past Medical History  Diagnosis Date  . Diabetes mellitus   . Anemia   . Hypothyroidism   . Hypertension   . Myeloproliferative disorder 06/30/2012    History reviewed. No pertinent family history.  History   Social History  . Marital Status: Widowed    Spouse Name: N/A    Number of Children: N/A  . Years of Education: N/A   Occupational History  . Not on file.   Social History Main Topics  . Smoking status: Current Everyday Smoker    Types: Cigars  . Smokeless tobacco: Not on file  . Alcohol Use: No  . Drug Use: No  . Sexually Active: Yes    Birth Control/ Protection: None   Other Topics Concern  . Not on file    Social History Narrative  . No narrative on file    History reviewed. No pertinent past surgical history.      [name] Kitchen aspirin EC  81 mg Oral QPC breakfast  . carvedilol  6.25 mg Oral BID WC  . diltiazem  10 mg Intravenous Once  . diltiazem  60 mg Oral Q6H  . febuxostat  40 mg Oral Daily  . feeding supplement  237 mL Oral QAC supper  . heparin  5,000 Units Subcutaneous Q8H  . levothyroxine  88 mcg Oral Daily  . LORazepam  0.5 mg Intravenous Once  . losartan  50 mg Oral Daily  . multivitamin with minerals  1 tablet Oral Daily  . potassium chloride  20 mEq Oral BID  . sodium chloride  3 mL Intravenous Q12H  . DISCONTD: diltiazem  60 mg Oral Q8H  . DISCONTD: losartan  100 mg Oral Daily      . DISCONTD: diltiazem (CARDIZEM) infusion Stopped (07/02/12 1535)    Physical Exam: Blood pressure 129/81, pulse 142, temperature 98 F (36.7 C), temperature source Oral, resp. rate 15, height 5\' 8"  (1.727 m), weight 168 lb 14.4 oz (76.613 kg), SpO2 98.00%.  Affect appropriate Chronically ill elderly black male HEENT: normal Neck supple with no adenopathy JVP normal no bruits no thyromegaly Lungs clear with no wheezing and good diaphragmatic motion Heart:  S1/S2 no murmur, no rub, gallop or click PMI normal Abdomen: benighn,  BS positve, no tenderness, no AAA no bruit.  No HSM or HJR Distal pulses intact with no bruits Plus one bilateral  edema Neuro non-focal Skin warm and dry No muscular weakness   Labs:   Lab Results  Component Value Date   WBC 5.4 07/03/2012   HGB 9.7* 07/03/2012   HCT 30.5* 07/03/2012   MCV 93.3 07/03/2012   PLT 363 07/03/2012    Lab 07/03/12 0433 06/29/12 2215  NA 143 --  K 4.8 --  CL 109 --  CO2 26 --  BUN 29* --  CREATININE 1.13 --  CALCIUM 9.0 --  PROT -- 6.8  BILITOT -- 0.4  ALKPHOS -- 72  ALT -- 12  AST -- 15  GLUCOSE 107* --   Lab Results  Component Value Date   CKTOTAL 37 06/30/2012   CKMB 3.0 06/30/2012   TROPONINI <0.30 06/30/2012        Radiology: Ct Head Wo Contrast  07/02/2012  *RADIOLOGY REPORT*  Clinical Data: Confusion.  Evaluate for potential CVA.  CT HEAD WITHOUT CONTRAST  Technique:  Contiguous axial images were obtained from the base of the skull through the vertex without contrast.  Comparison: No priors.  Findings: Mild cerebral and cerebellar atrophy is age appropriate. There are patchy and confluent areas of decreased attenuation throughout the deep and periventricular white matter of the cerebral hemispheres bilaterally, most compatible with chronic microvascular ischemic disease.  In the left subinsular region there is a slightly ill-defined area of decreased attenuation which could be concerning for an area of subacute ischemia.  No acute intracranial hemorrhage, focal mass, mass effect, hydrocephalus or abnormal intra or extra-axial fluid collections.  No acute displaced skull fractures are identified.  Visualized paranasal sinuses and mastoids are well pneumatized.  IMPRESSION: 1.  Ill-defined area of low attenuation in the left subinsular region could represent an area of age indeterminate (potentially subacute) ischemia.  This could be further delineated with MRI of the brain if clinically indicated. 2.  Mild cerebral and cerebellar atrophy is age appropriate. 3.  Chronic microvascular ischemic changes in the white matter of the brain, as above.  Original Report Authenticated By: Florencia Reasons, M.D.   Chest Portable 1 View  06/29/2012  *RADIOLOGY REPORT*  Clinical Data: Shortness of breath, cough  PORTABLE CHEST - 1 VIEW  Comparison: 12/24/2006  Findings: Low lung volumes.  Small bilateral pleural effusions. Patchy bibasilar consolidation or atelectasis.  Mild diffuse interstitial edema/infiltrates.  Heart size upper limits normal for technique.  IMPRESSION:  1.  Bibasilar infiltrates or edema with small effusions.  Original Report Authenticated By: Thora Lance III, M.D.    EKG: Atrial flutter with no  ischemic changes   ASSESSMENT AND PLAN:  Atrial flutter:  Chronicity not clear since patient has not had palpitations and no old ECG;s in MUSE.  Dyspnea and failure to thrive not explained by flutter as rate reasonably controlled now  And EF low normal at 50-55%.  Given severe anemia and need for chronic transfusion he is not a candidate for anticoagulation so course of rate control is best option.  Will  Change cardizem To long acting  Anemia:  F/U heme onc  He really has not responded will to EPO or transfusions since March with dyspnea transiently helped by increase Hct but then feels poorly in 48 hours.    Would appear to be a candidate for palliative care consult in near future.   No further diagnostic cardiac tests indicated  Signed:  Charlton Haws 07/03/2012, 10:20 AM

## 2012-07-04 ENCOUNTER — Other Ambulatory Visit: Payer: Self-pay

## 2012-07-04 ENCOUNTER — Inpatient Hospital Stay (HOSPITAL_COMMUNITY): Payer: Medicare Other

## 2012-07-04 DIAGNOSIS — I4892 Unspecified atrial flutter: Secondary | ICD-10-CM

## 2012-07-04 DIAGNOSIS — R0602 Shortness of breath: Secondary | ICD-10-CM

## 2012-07-04 LAB — BASIC METABOLIC PANEL
BUN: 24 mg/dL — ABNORMAL HIGH (ref 6–23)
CO2: 22 mEq/L (ref 19–32)
Glucose, Bld: 97 mg/dL (ref 70–99)
Potassium: 4.8 mEq/L (ref 3.5–5.1)
Sodium: 141 mEq/L (ref 135–145)

## 2012-07-04 LAB — CBC
HCT: 30.3 % — ABNORMAL LOW (ref 39.0–52.0)
Hemoglobin: 9.4 g/dL — ABNORMAL LOW (ref 13.0–17.0)
MCH: 29.1 pg (ref 26.0–34.0)
MCV: 93.8 fL (ref 78.0–100.0)
RBC: 3.23 MIL/uL — ABNORMAL LOW (ref 4.22–5.81)

## 2012-07-04 MED ORDER — DILTIAZEM HCL ER COATED BEADS 240 MG PO CP24
240.0000 mg | ORAL_CAPSULE | Freq: Every day | ORAL | Status: DC
  Administered 2012-07-04: 240 mg via ORAL
  Filled 2012-07-04 (×2): qty 1

## 2012-07-04 MED ORDER — ENSURE COMPLETE PO LIQD
237.0000 mL | Freq: Three times a day (TID) | ORAL | Status: DC
  Administered 2012-07-04 – 2012-07-07 (×5): 237 mL via ORAL

## 2012-07-04 MED ORDER — OXYCODONE HCL 5 MG PO TABS
5.0000 mg | ORAL_TABLET | Freq: Once | ORAL | Status: AC
  Administered 2012-07-04: 5 mg via ORAL
  Filled 2012-07-04: qty 1

## 2012-07-04 MED ORDER — FUROSEMIDE 10 MG/ML IJ SOLN
40.0000 mg | Freq: Once | INTRAMUSCULAR | Status: AC
  Administered 2012-07-04: 40 mg via INTRAVENOUS
  Filled 2012-07-04: qty 4

## 2012-07-04 MED ORDER — CARVEDILOL 12.5 MG PO TABS
12.5000 mg | ORAL_TABLET | Freq: Two times a day (BID) | ORAL | Status: DC
  Administered 2012-07-04 – 2012-07-05 (×3): 12.5 mg via ORAL
  Filled 2012-07-04 (×6): qty 1

## 2012-07-04 NOTE — Progress Notes (Signed)
SLP Cancellation Note  Treatment cancelled today due to medical issues with patient which prohibited therapy.  Pt currently is short of breath, slp's second attempt to see pt today.     Per RN, pt is for CXR today.    Family reports pt eating/drinking well today, no coughing with intake was 50%.  RN also concurs pt is swallowing well.   Donavan Burnet, MS [facility] SLP [phone]

## 2012-07-04 NOTE — Progress Notes (Signed)
OT Note:  Pt is out for chest xray.  HR has been variable per RN/monitor tech.  Will check back another time.  [place], OTR/L [phone] 07/04/2012

## 2012-07-04 NOTE — Progress Notes (Signed)
[facility] PROGRESS NOTE  Briant Angelillo UEA:[id number] DOB: [birth year] DOA: 06/29/2012 PCP: Laurena Slimmer, MD  Assessment/Plan: Principal Problem:  *Atrial flutter Active Problems:  Anemia  Myeloproliferative disorder  Shortness of breath  Volume overload  Subacute delirium  Hypothyroidism  New onset atrial flutter--Uncontrolled again (despite Coreg). Resumed Cardizem infusion yesterday. Remains in atrial flutter with rapid VR 126  TSH normal, echocardiogram noted. Not a candidate for anticoagulation candidate secondary to transfusion-dependent myeloproliferative disorder (discussed with Dr. Clelia Croft). I also discussed this with the patient's daughter was currently by the bedside, Continue aspirin. Cardiology consulted.Will increase carvedilol to 12.5 mg BID. Continue diltiazem   Volume overload--Continue oral Lasix. Daily weights, I&O. History most suggestive of tachycardia-induced overload rather than diastolic CHF.   Subacute delirium--much improved today. By history present ~1 week, family suspects secondary to pain medication. No history of dementia. Recognizes family, but complains of hallucinations. Urinalysis and CXR not suggestive of infection but will check urine culture. CT head without contrast suggestive of subacute CVA. No further workup will be done at the management will not change. Patient is not a candidate for anticoagulation  PT/OT/speech , and recommended SNF   Anemia, Myeloproliferative disorder--hemoglobin stable. Transfuse for Hgb ~6. Dr. Clelia Croft notified of admission. Hemoglobin at baseline  Diabetes mellitus--diet controlled? CBG well-controlled. No need for SSI.   Hypothyroidism--TSH normal. Continue levothyroxine   Shortness of breath likely secondary to overt aspiration given altered mental status. Repeat chest x-ray. Could also be secondary to his atrial flutter, his rate was 140s this morning, Coreg has been increased by cardiology   Code Status:  full  Family Communication: family updated about patient's clinical progress  Disposition Plan: Skilled nursing facility tomorrow   Brief narrative:  78 year old man with history of myeloproliferative disorder (transfusion dependent) and hypothyroidism brought to the hospital by his family because of progressive worsening of shortness of breath for the past several weeks. Found to have atrial flutter with RVR. History of subacute delirium x1 week, family thought secondary to narcotics.  Consultants:  none Procedures:  2-d echocardiogram 8/12--LVEF 50-55%, grade 1 diastolic dysfunction.     HPI/Subjective:  Complaining of shortness of breath     Objective: Filed Vitals:   07/03/12 1330 07/03/12 2211 07/04/12 0500 07/04/12 1315  BP: 115/63 107/50 101/67 95/59  Pulse: 94 89 126 120  Temp: 97.4 F (36.3 C) 97.6 F (36.4 C) 97.4 F (36.3 C) 97.4 F (36.3 C)  TempSrc: Oral Oral Oral Oral  Resp: 20 18 20 20   Height:      Weight:   75.932 kg (167 lb 6.4 oz)   SpO2: 98% 90% 94% 97%    Intake/Output Summary (Last 24 hours) at 07/04/12 1502 Last data filed at 07/04/12 0830  Gross per 24 hour  Intake    360 ml  Output    445 ml  Net    -85 ml    Exam:  HENT:  Head: Atraumatic.  Nose: Nose normal.  Mouth/Throat: Oropharynx is clear and moist.  Eyes: Conjunctivae are normal. Pupils are equal, round, and reactive to light. No scleral icterus.  Neck: Neck supple. No tracheal deviation present.  Cardiovascular: Normal rate, regular rhythm, normal heart sounds and intact distal pulses.  Pulmonary/Chest: Effort normal and breath sounds normal. No respiratory distress.  Abdominal: Soft. Normal appearance and bowel sounds are normal. She exhibits no distension. There is no tenderness.  Musculoskeletal: She exhibits no edema and no tenderness.  Neurological: She is alert. No cranial nerve deficit.  Data Reviewed: Basic Metabolic Panel:  Lab 07/04/12 9604 07/03/12 0433  07/02/12 0459 06/29/12 2215  NA 141 143 143 139  K 4.8 4.8 4.7 4.0  CL 109 109 109 105  CO2 22 26 22 23   GLUCOSE 97 107* 110* 101*  BUN 24* 29* 28* 18  CREATININE 1.10 1.13 1.14 0.96  CALCIUM 8.6 9.0 8.7 8.7  MG -- -- -- 2.1  PHOS -- -- -- --    Liver Function Tests:  Lab 06/29/12 2215  AST 15  ALT 12  ALKPHOS 72  BILITOT 0.4  PROT 6.8  ALBUMIN 2.9*   No results found for this basename: LIPASE:5,AMYLASE:5 in the last 168 hours No results found for this basename: AMMONIA:5 in the last 168 hours  CBC:  Lab 07/04/12 0448 07/03/12 0433 07/02/12 0459 06/29/12 2200  WBC 5.2 5.4 5.0 4.7  NEUTROABS -- -- -- --  HGB 9.4* 9.7* 9.0* 7.7*  HCT 30.3* 30.5* 28.4* 24.7*  MCV 93.8 93.3 93.4 93.2  PLT 388 363 402* 353    Cardiac Enzymes:  Lab 06/30/12 1910 06/30/12 1045 06/30/12 0320  CKTOTAL 37 51 58  CKMB 3.0 3.8 3.9  CKMBINDEX -- -- --  TROPONINI <0.30 <0.30 <0.30   BNP (last 3 results)  Basename 06/29/12 2200  PROBNP 7889.0*     CBG:  Lab 06/29/12 2155  GLUCAP 94    Recent Results (from the past 240 hour(s))  TECHNOLOGIST REVIEW     Status: Normal   Collection Time   06/27/12  8:41 AM      Component Value Range Status Comment   Technologist Review rare blast   Final   URINE CULTURE     Status: Normal (Preliminary result)   Collection Time   07/01/12  8:53 PM      Component Value Range Status Comment   Specimen Description URINE, RANDOM   Final    Special Requests NONE   Final    Culture  Setup Time 07/02/2012 02:12   Final    Colony Count >=100,000 COLONIES/ML   Final    Culture GRAM NEGATIVE RODS   Final    Report Status PENDING   Incomplete      Studies: Ct Head Wo Contrast  07/02/2012  *RADIOLOGY REPORT*  Clinical Data: Confusion.  Evaluate for potential CVA.  CT HEAD WITHOUT CONTRAST  Technique:  Contiguous axial images were obtained from the base of the skull through the vertex without contrast.  Comparison: No priors.  Findings: Mild cerebral and  cerebellar atrophy is age appropriate. There are patchy and confluent areas of decreased attenuation throughout the deep and periventricular white matter of the cerebral hemispheres bilaterally, most compatible with chronic microvascular ischemic disease.  In the left subinsular region there is a slightly ill-defined area of decreased attenuation which could be concerning for an area of subacute ischemia.  No acute intracranial hemorrhage, focal mass, mass effect, hydrocephalus or abnormal intra or extra-axial fluid collections.  No acute displaced skull fractures are identified.  Visualized paranasal sinuses and mastoids are well pneumatized.  IMPRESSION: 1.  Ill-defined area of low attenuation in the left subinsular region could represent an area of age indeterminate (potentially subacute) ischemia.  This could be further delineated with MRI of the brain if clinically indicated. 2.  Mild cerebral and cerebellar atrophy is age appropriate. 3.  Chronic microvascular ischemic changes in the white matter of the brain, as above.  Original Report Authenticated By: Florencia Reasons, M.D.   Chest  Portable 1 View  06/29/2012  *RADIOLOGY REPORT*  Clinical Data: Shortness of breath, cough  PORTABLE CHEST - 1 VIEW  Comparison: 12/24/2006  Findings: Low lung volumes.  Small bilateral pleural effusions. Patchy bibasilar consolidation or atelectasis.  Mild diffuse interstitial edema/infiltrates.  Heart size upper limits normal for technique.  IMPRESSION:  1.  Bibasilar infiltrates or edema with small effusions.  Original Report Authenticated By: Thora Lance III, M.D.    Scheduled Meds:   . aspirin EC  81 mg Oral QPC breakfast  . carvedilol  12.5 mg Oral BID WC  . diltiazem  240 mg Oral Daily  . febuxostat  40 mg Oral Daily  . feeding supplement  237 mL Oral TID BM  . furosemide  40 mg Intravenous Once  . heparin  5,000 Units Subcutaneous Q8H  . levothyroxine  88 mcg Oral Daily  . LORazepam  0.5 mg  Intravenous Once  . LORazepam  0.25 mg Oral Once  . losartan  50 mg Oral Daily  . multivitamin with minerals  1 tablet Oral Daily  . oxyCODONE  5 mg Oral Once  . potassium chloride  20 mEq Oral BID  . sodium chloride  3 mL Intravenous Q12H  . DISCONTD: carvedilol  6.25 mg Oral BID WC  . DISCONTD: diltiazem  60 mg Oral Q6H  . DISCONTD: feeding supplement  237 mL Oral QAC supper   Continuous Infusions:   Principal Problem:  *Atrial flutter Active Problems:  Anemia  Myeloproliferative disorder  Shortness of breath  Volume overload  Subacute delirium  Hypothyroidism    Time spent: 40 minutes   [facility]  [facility] Pager [phone]. If 8PM-8AM, please contact night-coverage at [email or web address], password [facility] 07/04/2012, 3:02 PM  LOS: 5 days

## 2012-07-04 NOTE — Progress Notes (Addendum)
[employer] [place] received [id number]. Nyilah Kight C. Raley Novicki MSW, LCSW [phone] Spoke with [facility]. Paperwork filled out. Patient able to be d/ced tomorrow. [employer] aware that patient will be transferred on Saturday.  Duffy Dantonio C. Kierre Hintz MSW, LCSW [phone]

## 2012-07-04 NOTE — Clinical Documentation Improvement (Signed)
POA DOCUMENTATION CLARIFICATION QUERY  THIS DOCUMENT IS NOT A PERMANENT PART OF THE MEDICAL RECORD   Please update your documentation within the medical record to reflect your response to this query.                                                                                     07/04/12  Dr. Susie Cassette and/or Associates,  In a better effort to capture your patient's severity of illness, reflect appropriate length of stay and utilization of resources, a review of the patient medical record has revealed the following indicators:  "Subacute delirium--much improved today. By history present ~1 week, family suspects secondary to pain medication. No history of dementia. Recognizes family, but complains of hallucinations. Urinalysis and CXR not suggestive of infection but will check urine culture. CT head without contrast suggestive of subacute CVA. No further workup will be done at the management will not change. Patient is not a candidate for anticoagulation  PT/OT/speech" ABROL,NAYANA  [facility]  07/03/2012, 12:15 PM LOS: 4 days    07/02/12  CT Head without contrast IMPRESSION:  1. Ill-defined area of low attenuation in the left subinsular region could represent an area of age indeterminate (potentially subacute) ischemia. This could be further delineated with MRI of the brain if clinically indicated.  2. Mild cerebral and cerebellar atrophy is age appropriate.  3. Chronic microvascular ischemic changes in the white matter of  the brain, as above.  Original Report Authenticated By: Florencia Reasons, M.D.    Please document in the progress notes and discharge summary if the subacute CVA was:              - Present on Admission   - NOT present on Admission and it developed during the inpatient stay   - Unable to Clinically Determine whether the condition was present on admission.   - Documentation insufficient to determine if condition was present on admission.   In  responding to this query please exercise your independent judgment.    The fact that a query is asked, does not imply that any particular answer is desired or expected.   Reviewed: 07/04/12 - query deleted.  Mathis Dad RN    Thank You,  Jerral Ralph  RN BSN CCDS Certified Clinical Documentation Specialist: Cell   [phone]  Health Information Management [place]   TO RESPOND TO THE THIS QUERY, FOLLOW THE INSTRUCTIONS BELOW:  1. If needed, update documentation for the patient's encounter via the notes activity.  2. Access this query again and click edit on the In [employer].  3. After updating, or not, click F2 to complete all highlighted (required) fields concerning your review. Select "additional documentation in the medical record" OR "no additional documentation provided".  4. Click Sign note button.  5. The deficiency will fall out of your In Basket *Please let us know if you are not able to complete this workflow by phone or e-mail (listed below).

## 2012-07-04 NOTE — Progress Notes (Signed)
Physical Therapy Treatment Patient Details Name: Kyle Heath MRN: [id number] DOB: 07/08/1927 Today's Date: 07/04/2012 Time: 1125-1140 PT Time Calculation (min): 15 min  PT Assessment / Plan / Recommendation Comments on Treatment Session  Pt with elevated HR during ambulation 144-150 so therapist limited distance.  Pt continues to fatigue quickly with poor activity tolerance.  Family now agreeable to ST-SNF (per chart).    Follow Up Recommendations  Skilled nursing facility    Barriers to Discharge        Equipment Recommendations  3 in 1 bedside comode;Tub/shower seat    Recommendations for Other Services    Frequency     Plan Discharge plan remains appropriate;Frequency remains appropriate    Precautions / Restrictions Precautions Precautions: Fall Precaution Comments: monitor HR   Pertinent Vitals/Pain No pain HR 102 bpm pregait HR 144-150 bpm during gait HR 105 bpm upon resting in recliner after gait    Mobility  Bed Mobility Bed Mobility: Supine to Sit Supine to Sit: 5: Supervision;HOB elevated;With rails Details for Bed Mobility Assistance: assist for untangling sheet from legs Transfers Transfers: Sit to Stand;Stand to Sit Sit to Stand: With upper extremity assist;From bed;4: Min assist Stand to Sit: 4: Min guard;With upper extremity assist;To bed Details for Transfer Assistance: verbal cue for RW placement and hand placement Ambulation/Gait Ambulation/Gait Assistance: 4: Min assist Ambulation Distance (Feet): 80 Feet Assistive device: Rolling walker Ambulation/Gait Assistance Details: verbal cues to avoid objects, safe RW distance, and max cues to look up at exit sign to assist with prompting neck extension and proper posture, HR 144-150 during ambulation and down to 105 after ambulation Gait Pattern: Step-through pattern;Trunk flexed;Decreased stride length Gait velocity: decreased    Exercises     PT Diagnosis:    PT Problem List:   PT Treatment  Interventions:     PT Goals Acute Rehab PT Goals PT Goal: Sit to Stand - Progress: Progressing toward goal PT Goal: Stand to Sit - Progress: Progressing toward goal PT Goal: Ambulate - Progress: Progressing toward goal  Visit Information  Last PT Received On: 07/04/12 Assistance Needed: +1    Subjective Data  Subjective: Let's go.   Cognition  Overall Cognitive Status: Difficult to assess Arousal/Alertness: Awake/alert Cognition - Other Comments: continues to follow commands and verbalized more when asked questions    Balance     End of Session PT - End of Session Equipment Utilized During Treatment: Gait belt Activity Tolerance: Patient limited by fatigue Patient left: in chair;with call bell/phone within reach;with family/visitor present   GP     Rajat Staver,KATHrine E 07/04/2012, 12:13 PM Pager: [phone]

## 2012-07-04 NOTE — Progress Notes (Signed)
Nurse tech notified me that patient has been up in the room without the bed alarm going off at least twice.  Nurse tech stated she has been adamant about turning the alarm on before leaving the room.  Patient does have a guest staying in the room with him.  We informed her that she is not to turn the bed alarm off when patient needs to get up and that they should use the call light to let nurse or nurse tech know he needs to get out of bed.  They are currently both sleeping in the room.  Will continue to monitor.

## 2012-07-04 NOTE — Progress Notes (Signed)
Nutrition Follow-up  Intervention:  Increase Ensure to tid. Encourage po.  Assessment:   Lunch hardly touched.  Intake 25-55% meals usually.  Likes Ensure.  Weight loss of 15# in the past 5 days.  Diet Order:  Heart health with ground meats, extra sauce  Meds: Scheduled Meds:   . aspirin EC  81 mg Oral QPC breakfast  . carvedilol  12.5 mg Oral BID WC  . diltiazem  240 mg Oral Daily  . febuxostat  40 mg Oral Daily  . feeding supplement  237 mL Oral QAC supper  . furosemide  40 mg Intravenous Once  . heparin  5,000 Units Subcutaneous Q8H  . levothyroxine  88 mcg Oral Daily  . LORazepam  0.5 mg Intravenous Once  . LORazepam  0.25 mg Oral Once  . losartan  50 mg Oral Daily  . multivitamin with minerals  1 tablet Oral Daily  . oxyCODONE  5 mg Oral Once  . potassium chloride  20 mEq Oral BID  . sodium chloride  3 mL Intravenous Q12H  . DISCONTD: carvedilol  6.25 mg Oral BID WC  . DISCONTD: diltiazem  60 mg Oral Q6H   Continuous Infusions:  PRN Meds:.sodium chloride, acetaminophen, ondansetron (ZOFRAN) IV, sodium chloride  Labs:  CMP     Component Value Date/Time   NA 141 07/04/2012 0448   K 4.8 07/04/2012 0448   CL 109 07/04/2012 0448   CO2 22 07/04/2012 0448   GLUCOSE 97 07/04/2012 0448   BUN 24* 07/04/2012 0448   CREATININE 1.10 07/04/2012 0448   CALCIUM 8.6 07/04/2012 0448   PROT 6.8 06/29/2012 2215   ALBUMIN 2.9* 06/29/2012 2215   AST 15 06/29/2012 2215   ALT 12 06/29/2012 2215   ALKPHOS 72 06/29/2012 2215   BILITOT 0.4 06/29/2012 2215   GFRNONAA 61* 07/04/2012 0448   GFRAA 70* 07/04/2012 0448     Intake/Output Summary (Last 24 hours) at 07/04/12 1359 Last data filed at 07/04/12 0830  Gross per 24 hour  Intake    360 ml  Output    445 ml  Net    -85 ml    Weight Status:   Wt Readings from Last 3 Encounters:  07/04/12 167 lb 6.4 oz (75.932 kg)  05/30/12 182 lb 3.2 oz (82.645 kg)  04/18/12 184 lb 11.2 oz (83.779 kg)   15# loss in the past 5 days.  Re-estimated  needs:  1550-1650 kcal, 90-110 gm protein  Nutrition Dx:  Inadequate oral intake r/t decreased appetite AEB observed and documented po.  Goal:  PO intake to meet >75% estimated needs with meals and supplements.  Monitor:  Intake, weight, labs,  Kyle Heath, RD [phone]

## 2012-07-04 NOTE — Clinical Social Work Psychosocial (Unsigned)
     Clinical Social Work Department BRIEF PSYCHOSOCIAL ASSESSMENT 07/04/2012  Patient:  Kyle Heath, Kyle Heath     Account Number:  [id number]     Admit date:  06/29/2012  Clinical Social Worker:  Hattie Perch  Date/Time:  07/04/2012 12:00 M  Referred by:  Physician  Date Referred:  07/04/2012 Referred for  SNF Placement   Other Referral:   Interview type:  Family Other interview type:   patient    PSYCHOSOCIAL DATA Living Status:  FAMILY Admitted from facility:   Level of care:   Primary support name:  Kyle Heath Primary support relationship to patient:  CHILD, ADULT Degree of support available:   good    CURRENT CONCERNS Current Concerns  Post-Acute Placement   Other Concerns:    SOCIAL WORK ASSESSMENT / PLAN CSW met with patient and daughter at bedside. patient is alert and oriented X3. patients daughter requesting patient be faxed out as they are now agreeable to snf. [facility] is first choice. they do not want [facility]. [employer] medicare Berkley Harvey will need to be obtained.   Assessment/plan status:   Other assessment/ plan:   Information/referral to community resources:    PATIENTS/FAMILYS RESPONSE TO PLAN OF CARE: agreeable to recieving bed offers.

## 2012-07-04 NOTE — Progress Notes (Signed)
Subjective:  Patient feels well today. No chest pain or dyspnea.  Remains in atrial flutter with rapid VR 126  Objective:  Vital Signs in the last 24 hours: Temp:  [97.4 F (36.3 C)-97.6 F (36.4 C)] 97.4 F (36.3 C) (08/16 0500) Pulse Rate:  [89-126] 126  (08/16 0500) Resp:  [18-20] 20  (08/16 0500) BP: (101-115)/(50-67) 101/67 mmHg (08/16 0500) SpO2:  [90 %-98 %] 94 % (08/16 0500) Weight:  [167 lb 6.4 oz (75.932 kg)] 167 lb 6.4 oz (75.932 kg) (08/16 0500)  Intake/Output from previous day: 08/15 0701 - 08/16 0700 In: 480 [P.O.:480] Out: 445 [Urine:445] Intake/Output from this shift:       . aspirin EC  81 mg Oral QPC breakfast  . carvedilol  12.5 mg Oral BID WC  . diltiazem  10 mg Intravenous Once  . diltiazem  60 mg Oral Q6H  . febuxostat  40 mg Oral Daily  . feeding supplement  237 mL Oral QAC supper  . heparin  5,000 Units Subcutaneous Q8H  . levothyroxine  88 mcg Oral Daily  . LORazepam  0.5 mg Intravenous Once  . LORazepam  0.25 mg Oral Once  . losartan  50 mg Oral Daily  . multivitamin with minerals  1 tablet Oral Daily  . oxyCODONE  5 mg Oral Once  . potassium chloride  20 mEq Oral BID  . sodium chloride  3 mL Intravenous Q12H  . DISCONTD: carvedilol  6.25 mg Oral BID WC      Physical Exam: The patient appears to be in no distress.  Head and neck exam reveals that the pupils are equal and reactive.  The extraocular movements are full.  There is no scleral icterus.  Mouth and pharynx are benign.  No lymphadenopathy.  No carotid bruits.  The jugular venous pressure is normal.  Thyroid is not enlarged or tender.  Chest reveals poor inspiratory effort.  Decrease breath sounds at bases.  Heart reveals no abnormal lift or heave.  First and second heart sounds are normal.  There is no murmur gallop rub or click. Rhythm is irregular  The abdomen is soft and nontender.  Bowel sounds are normoactive.  There is no hepatosplenomegaly or mass.  There are no  abdominal bruits.  Extremities reveal no phlebitis or edema.    Neurologic exam is normal strength and no lateralizing weakness.  No sensory deficits.  Integument reveals no rash  Lab Results:  Basename 07/04/12 0448 07/03/12 0433  WBC 5.2 5.4  HGB 9.4* 9.7*  PLT 388 363    Basename 07/04/12 0448 07/03/12 0433  NA 141 143  K 4.8 4.8  CL 109 109  CO2 22 26  GLUCOSE 97 107*  BUN 24* 29*  CREATININE 1.10 1.13   No results found for this basename: TROPONINI:2,CK,MB:2 in the last 72 hours Hepatic Function Panel No results found for this basename: PROT,ALBUMIN,AST,ALT,ALKPHOS,BILITOT,BILIDIR,IBILI in the last 72 hours No results found for this basename: CHOL in the last 72 hours No results found for this basename: PROTIME in the last 72 hours 2Imaging: Ct Head Wo Contrast  07/02/2012  *RADIOLOGY REPORT*  Clinical Data: Confusion.  Evaluate for potential CVA.  CT HEAD WITHOUT CONTRAST  Technique:  Contiguous axial images were obtained from the base of the skull through the vertex without contrast.  Comparison: No priors.  Findings: Mild cerebral and cerebellar atrophy is age appropriate. There are patchy and confluent areas of decreased attenuation throughout the deep and periventricular white matter  of the cerebral hemispheres bilaterally, most compatible with chronic microvascular ischemic disease.  In the left subinsular region there is a slightly ill-defined area of decreased attenuation which could be concerning for an area of subacute ischemia.  No acute intracranial hemorrhage, focal mass, mass effect, hydrocephalus or abnormal intra or extra-axial fluid collections.  No acute displaced skull fractures are identified.  Visualized paranasal sinuses and mastoids are well pneumatized.  IMPRESSION: 1.  Ill-defined area of low attenuation in the left subinsular region could represent an area of age indeterminate (potentially subacute) ischemia.  This could be further delineated with MRI of  the brain if clinically indicated. 2.  Mild cerebral and cerebellar atrophy is age appropriate. 3.  Chronic microvascular ischemic changes in the white matter of the brain, as above.  Original Report Authenticated By: Florencia Reasons, M.D.    Cardiac Studies: Echo EF 50-55% Telemetry shows atrial flutter with rapid VR. Assessment/Plan:   Atrial flutter (06/30/2012)   Assessment: Rate still not optimally controlled   Plan: Will increase carvedilol to 12.5 mg BID.  Continue diltiazem.   LOS: 5 days    Cassell Clement 07/04/2012, 8:31 AM

## 2012-07-04 NOTE — Progress Notes (Signed)
Gave bed offers. Family chooses [facility]. Submitted [employer] auth request.  Krist Rosenboom C. Henleigh Robello MSW, LCSW [phone]

## 2012-07-05 DIAGNOSIS — I4892 Unspecified atrial flutter: Secondary | ICD-10-CM

## 2012-07-05 DIAGNOSIS — R0602 Shortness of breath: Secondary | ICD-10-CM

## 2012-07-05 LAB — CBC
HCT: 29.7 % — ABNORMAL LOW (ref 39.0–52.0)
Hemoglobin: 9.5 g/dL — ABNORMAL LOW (ref 13.0–17.0)
MCHC: 32 g/dL (ref 30.0–36.0)
RBC: 3.17 MIL/uL — ABNORMAL LOW (ref 4.22–5.81)

## 2012-07-05 LAB — BASIC METABOLIC PANEL
BUN: 25 mg/dL — ABNORMAL HIGH (ref 6–23)
Chloride: 106 mEq/L (ref 96–112)
GFR calc Af Amer: 71 mL/min — ABNORMAL LOW (ref >90)
Glucose, Bld: 99 mg/dL (ref 70–99)
Potassium: 4.7 mEq/L (ref 3.5–5.1)

## 2012-07-05 LAB — URINE CULTURE

## 2012-07-05 MED ORDER — FUROSEMIDE 10 MG/ML IJ SOLN
40.0000 mg | Freq: Every day | INTRAMUSCULAR | Status: DC
  Administered 2012-07-05 – 2012-07-07 (×3): 40 mg via INTRAVENOUS
  Filled 2012-07-05 (×3): qty 4

## 2012-07-05 MED ORDER — DEXTROSE 5 % IV SOLN
1.0000 g | INTRAVENOUS | Status: DC
  Administered 2012-07-05 – 2012-07-06 (×2): 1 g via INTRAVENOUS
  Filled 2012-07-05 (×3): qty 10

## 2012-07-05 MED ORDER — DILTIAZEM HCL ER COATED BEADS 360 MG PO CP24
360.0000 mg | ORAL_CAPSULE | Freq: Every day | ORAL | Status: DC
  Administered 2012-07-05 – 2012-07-07 (×3): 360 mg via ORAL
  Filled 2012-07-05 (×4): qty 1

## 2012-07-05 MED ORDER — LORAZEPAM 2 MG/ML IJ SOLN
0.5000 mg | INTRAMUSCULAR | Status: AC
  Administered 2012-07-05: 0.5 mg via INTRAVENOUS
  Filled 2012-07-05: qty 1

## 2012-07-05 NOTE — Progress Notes (Signed)
Subjective:  Patient slept poorly last night, and is lethargic this am.  Not eating well according to family. Remains in atrial flutter with rapid VR 140s this am.  Objective:  Vital Signs in the last 24 hours: Temp:  [97.4 F (36.3 C)-98.1 F (36.7 C)] 97.9 F (36.6 C) (08/17 0455) Pulse Rate:  [117-148] 148  (08/17 0455) Resp:  [18-20] 19  (08/17 0455) BP: (92-115)/(53-70) 115/70 mmHg (08/17 0455) SpO2:  [97 %-98 %] 98 % (08/16 2102)  Intake/Output from previous day: 08/16 0701 - 08/17 0700 In: 120 [P.O.:120] Out: 450 [Urine:450] Intake/Output from this shift:       . aspirin EC  81 mg Oral QPC breakfast  . carvedilol  12.5 mg Oral BID WC  . diltiazem  240 mg Oral Daily  . febuxostat  40 mg Oral Daily  . feeding supplement  237 mL Oral TID BM  . furosemide  40 mg Intravenous Once  . heparin  5,000 Units Subcutaneous Q8H  . levothyroxine  88 mcg Oral Daily  . LORazepam  0.5 mg Intravenous Once  . LORazepam  0.5 mg Intravenous NOW  . losartan  50 mg Oral Daily  . multivitamin with minerals  1 tablet Oral Daily  . potassium chloride  20 mEq Oral BID  . sodium chloride  3 mL Intravenous Q12H  . DISCONTD: carvedilol  6.25 mg Oral BID WC  . DISCONTD: diltiazem  60 mg Oral Q6H  . DISCONTD: feeding supplement  237 mL Oral QAC supper      Physical Exam: The patient appears to be in no distress.  Head and neck exam reveals that the pupils are equal and reactive.  The extraocular movements are full.  There is no scleral icterus.  Mouth and pharynx are benign.  No lymphadenopathy.  No carotid bruits.  The jugular venous pressure is normal.  Thyroid is not enlarged or tender.  Chest reveals poor inspiratory effort.  Decrease breath sounds at bases.  Heart reveals no abnormal lift or heave.  First and second heart sounds are normal.  There is no murmur gallop rub or click. Rhythm is irregular  The abdomen is soft and nontender.  Bowel sounds are normoactive.  There is no  hepatosplenomegaly or mass.  There are no abdominal bruits.  Extremities reveal no phlebitis or edema.    Neurologic exam is normal strength and no lateralizing weakness.  No sensory deficits.  Integument reveals no rash  Lab Results:  Basename 07/05/12 0524 07/04/12 0448  WBC 4.9 5.2  HGB 9.5* 9.4*  PLT 333 388    Basename 07/05/12 0524 07/04/12 0448  NA 141 141  K 4.7 4.8  CL 106 109  CO2 27 22  GLUCOSE 99 97  BUN 25* 24*  CREATININE 1.09 1.10   No results found for this basename: TROPONINI:2,CK,MB:2 in the last 72 hours Hepatic Function Panel No results found for this basename: PROT,ALBUMIN,AST,ALT,ALKPHOS,BILITOT,BILIDIR,IBILI in the last 72 hours No results found for this basename: CHOL in the last 72 hours No results found for this basename: PROTIME in the last 72 hours 2Imaging: Dg Chest 2 View  07/04/2012  *RADIOLOGY REPORT*  Clinical Data: Shortness of breath, diabetes  CHEST - 2 VIEW  Comparison: 06/29/2012  Findings: Cardiomediastinal silhouette is stable.  Again noted bilateral small pleural effusion with bilateral basilar atelectasis or infiltrate right greater than left.  Small amount of fluid noted right minor fissure. Bony thorax is unremarkable.  IMPRESSION:  Again noted bilateral small  pleural effusion with bilateral basilar atelectasis or infiltrate right greater than left.  Small amount of fluid noted right minor fissure.  Original Report Authenticated By: Natasha Mead, M.D.    Cardiac Studies: Echo EF 50-55% Telemetry shows atrial flutter with rapid VR. Assessment/Plan:   Atrial flutter (06/30/2012)   Assessment: Rate still not optimally controlled.  BP okay.   Plan: Will increase diltiazem to 360 mg daily. Will consider adding digoxin if rate continues high. Continue carvedilol.  LOS: 6 days    Cassell Clement 07/05/2012, 8:24 AM

## 2012-07-05 NOTE — Progress Notes (Signed)
Pt in Trigeminy times approximately 5 min.  Asymptomatic.  Notified MD. Nino Parsley

## 2012-07-05 NOTE — Progress Notes (Addendum)
[facility] PROGRESS NOTE  Kyle Heath ZOX:[id number] DOB: 22-Jan-1927 DOA: 06/29/2012 PCP: Laurena Slimmer, MD  Assessment/Plan: Principal Problem:  *Atrial flutter Active Problems:  Anemia  Myeloproliferative disorder  Shortness of breath  Volume overload  Subacute delirium  Hypothyroidism  New onset atrial flutter--Uncontrolled again (despite Coreg). Resumed Cardizem infusion yesterday. Remains in atrial flutter with rapid VR 126  TSH normal, echocardiogram noted. Not a candidate for anticoagulation candidate secondary to transfusion-dependent myeloproliferative disorder (discussed with Dr. Clelia Croft). I also discussed this with the patient's daughter was currently by the bedside, Continue aspirin. Cardiology consulted.Will increase carvedilol to 12.5 mg BID. Increase diltiazem to 360 mg by mouth daily  Volume overload--Continue IV Lasix. Daily weights, I&O. History most suggestive of tachycardia-induced overload rather than diastolic CHF.    Subacute delirium--much improved today. By history present ~1 week, family suspects secondary to pain medication. Please avoid sedating medications overnight. No history of dementia.   CT head without contrast suggestive of subacute CVA. No further workup will be done at the management will not change. Patient is not a candidate for anticoagulation  PT/OT/speech , and recommended SNF      Anemia, Myeloproliferative disorder--hemoglobin stable. Transfuse for Hgb ~6. Dr. Clelia Croft notified of admission. Hemoglobin at baseline    Diabetes mellitus--diet controlled? CBG well-controlled. No need for SSI.  Hypothyroidism--TSH normal. Continue levothyroxine    Shortness of breath likely secondary to overt aspiration given altered mental status. Chest x-ray shows bilateral infiltrates likely secondary to volume overload.. Could also be secondary to his atrial flutter, his rate was 140s this morning, Coreg has been increased by cardiology , IV  Lasix   Code Status: full  Family Communication: family updated about patient's clinical progress  Disposition Plan: Skilled nursing facility tomorrow vs home with hospice on Monday   HPI/Subjective: Patient slept poorly last night, and is lethargic this am. Not eating well according to family.  Remains in atrial flutter with rapid VR 140s this am. Received Ativan last night because of agitation   Objective: Filed Vitals:   07/04/12 0500 07/04/12 1315 07/04/12 2102 07/05/12 0455  BP: 101/67 95/59 92/53  115/70  Pulse: 126 120 117 148  Temp: 97.4 F (36.3 C) 97.4 F (36.3 C) 98.1 F (36.7 C) 97.9 F (36.6 C)  TempSrc: Oral Oral Oral Axillary  Resp: 20 20 18 19   Height:      Weight: 75.932 kg (167 lb 6.4 oz)     SpO2: 94% 97% 98%     Intake/Output Summary (Last 24 hours) at 07/05/12 1133 Last data filed at 07/05/12 0645  Gross per 24 hour  Intake      0 ml  Output    450 ml  Net   -450 ml    Exam:  HENT:  Head: Atraumatic.  Nose: Nose normal.  Mouth/Throat: Oropharynx is clear and moist.  Eyes: Conjunctivae are normal. Pupils are equal, round, and reactive to light. No scleral icterus.  Neck: Neck supple. No tracheal deviation present.  Cardiovascular: Normal rate, regular rhythm, normal heart sounds and intact distal pulses.  Pulmonary/Chest: Effort normal and breath sounds normal. No respiratory distress.  Abdominal: Soft. Normal appearance and bowel sounds are normal. She exhibits no distension. There is no tenderness.  Musculoskeletal: She exhibits no edema and no tenderness.  Neurological: She is alert. No cranial nerve deficit.    Data Reviewed: Basic Metabolic Panel:  Lab 07/05/12 8119 07/04/12 0448 07/03/12 0433 07/02/12 0459 06/29/12 2215  NA 141 141 143 143 139  K 4.7 4.8 4.8 4.7 4.0  CL 106 109 109 109 105  CO2 27 22 26 22 23   GLUCOSE 99 97 107* 110* 101*  BUN 25* 24* 29* 28* 18  CREATININE 1.09 1.10 1.13 1.14 0.96  CALCIUM 8.8 8.6 9.0 8.7 8.7   MG -- -- -- -- 2.1  PHOS -- -- -- -- --    Liver Function Tests:  Lab 06/29/12 2215  AST 15  ALT 12  ALKPHOS 72  BILITOT 0.4  PROT 6.8  ALBUMIN 2.9*   No results found for this basename: LIPASE:5,AMYLASE:5 in the last 168 hours No results found for this basename: AMMONIA:5 in the last 168 hours  CBC:  Lab 07/05/12 0524 07/04/12 0448 07/03/12 0433 07/02/12 0459 06/29/12 2200  WBC 4.9 5.2 5.4 5.0 4.7  NEUTROABS -- -- -- -- --  HGB 9.5* 9.4* 9.7* 9.0* 7.7*  HCT 29.7* 30.3* 30.5* 28.4* 24.7*  MCV 93.7 93.8 93.3 93.4 93.2  PLT 333 388 363 402* 353    Cardiac Enzymes:  Lab 06/30/12 1910 06/30/12 1045 06/30/12 0320  CKTOTAL 37 51 58  CKMB 3.0 3.8 3.9  CKMBINDEX -- -- --  TROPONINI <0.30 <0.30 <0.30   BNP (last 3 results)  Basename 06/29/12 2200  PROBNP 7889.0*     CBG:  Lab 06/29/12 2155  GLUCAP 94    Recent Results (from the past 240 hour(s))  TECHNOLOGIST REVIEW     Status: Normal   Collection Time   06/27/12  8:41 AM      Component Value Range Status Comment   Technologist Review rare blast   Final   URINE CULTURE     Status: Normal   Collection Time   07/01/12  8:53 PM      Component Value Range Status Comment   Specimen Description URINE, RANDOM   Final    Special Requests NONE   Final    Culture  Setup Time 07/02/2012 02:12   Final    Colony Count >=100,000 COLONIES/ML   Final    Culture     Final    Value: KLEBSIELLA PNEUMONIAE     CITROBACTER FREUNDII   Report Status 07/05/2012 FINAL   Final    Organism ID, Bacteria KLEBSIELLA PNEUMONIAE   Final    Organism ID, Bacteria CITROBACTER FREUNDII   Final      Studies: Dg Chest 2 View  07/04/2012  *RADIOLOGY REPORT*  Clinical Data: Shortness of breath, diabetes  CHEST - 2 VIEW  Comparison: 06/29/2012  Findings: Cardiomediastinal silhouette is stable.  Again noted bilateral small pleural effusion with bilateral basilar atelectasis or infiltrate right greater than left.  Small amount of fluid noted  right minor fissure. Bony thorax is unremarkable.  IMPRESSION:  Again noted bilateral small pleural effusion with bilateral basilar atelectasis or infiltrate right greater than left.  Small amount of fluid noted right minor fissure.  Original Report Authenticated By: Natasha Mead, M.D.   Ct Head Wo Contrast  07/02/2012  *RADIOLOGY REPORT*  Clinical Data: Confusion.  Evaluate for potential CVA.  CT HEAD WITHOUT CONTRAST  Technique:  Contiguous axial images were obtained from the base of the skull through the vertex without contrast.  Comparison: No priors.  Findings: Mild cerebral and cerebellar atrophy is age appropriate. There are patchy and confluent areas of decreased attenuation throughout the deep and periventricular white matter of the cerebral hemispheres bilaterally, most compatible with chronic microvascular ischemic disease.  In the left subinsular region there is a slightly  ill-defined area of decreased attenuation which could be concerning for an area of subacute ischemia.  No acute intracranial hemorrhage, focal mass, mass effect, hydrocephalus or abnormal intra or extra-axial fluid collections.  No acute displaced skull fractures are identified.  Visualized paranasal sinuses and mastoids are well pneumatized.  IMPRESSION: 1.  Ill-defined area of low attenuation in the left subinsular region could represent an area of age indeterminate (potentially subacute) ischemia.  This could be further delineated with MRI of the brain if clinically indicated. 2.  Mild cerebral and cerebellar atrophy is age appropriate. 3.  Chronic microvascular ischemic changes in the white matter of the brain, as above.  Original Report Authenticated By: Florencia Reasons, M.D.   Chest Portable 1 View  06/29/2012  *RADIOLOGY REPORT*  Clinical Data: Shortness of breath, cough  PORTABLE CHEST - 1 VIEW  Comparison: 12/24/2006  Findings: Low lung volumes.  Small bilateral pleural effusions. Patchy bibasilar consolidation or  atelectasis.  Mild diffuse interstitial edema/infiltrates.  Heart size upper limits normal for technique.  IMPRESSION:  1.  Bibasilar infiltrates or edema with small effusions.  Original Report Authenticated By: Thora Lance III, M.D.    Scheduled Meds:   . aspirin EC  81 mg Oral QPC breakfast  . carvedilol  12.5 mg Oral BID WC  . diltiazem  360 mg Oral Daily  . febuxostat  40 mg Oral Daily  . feeding supplement  237 mL Oral TID BM  . furosemide  40 mg Intravenous Once  . heparin  5,000 Units Subcutaneous Q8H  . levothyroxine  88 mcg Oral Daily  . LORazepam  0.5 mg Intravenous Once  . LORazepam  0.5 mg Intravenous NOW  . losartan  50 mg Oral Daily  . multivitamin with minerals  1 tablet Oral Daily  . potassium chloride  20 mEq Oral BID  . sodium chloride  3 mL Intravenous Q12H  . DISCONTD: diltiazem  240 mg Oral Daily  . DISCONTD: feeding supplement  237 mL Oral QAC supper   Continuous Infusions:   Principal Problem:  *Atrial flutter Active Problems:  Anemia  Myeloproliferative disorder  Shortness of breath  Volume overload  Subacute delirium  Hypothyroidism    Time spent: 40 minutes   [facility]  [facility] Pager [phone]. If 8PM-8AM, please contact night-coverage at [email or web address], password [facility] 07/05/2012, 11:33 AM  LOS: 6 days

## 2012-07-05 NOTE — Progress Notes (Signed)
Pt had short burst of SVT - 8 beats.  Pt asymptomatic. Kyle Heath

## 2012-07-05 NOTE — Progress Notes (Signed)
Cm spoke with daughter at patient's bedside concerning dc planning. Per daughter choice patient to dc to Rehab upon discharge from hospital, then possibly home with home hospice. Patient's daughter provided with list of Home Hospice agencies for future reference. CSW notified of patient's dc plan.   Leonie Green [phone]

## 2012-07-06 DIAGNOSIS — R0602 Shortness of breath: Secondary | ICD-10-CM

## 2012-07-06 DIAGNOSIS — I4892 Unspecified atrial flutter: Secondary | ICD-10-CM

## 2012-07-06 LAB — CBC
HCT: 29.2 % — ABNORMAL LOW (ref 39.0–52.0)
Hemoglobin: 9.1 g/dL — ABNORMAL LOW (ref 13.0–17.0)
MCH: 29.2 pg (ref 26.0–34.0)
MCHC: 31.2 g/dL (ref 30.0–36.0)
MCV: 93.6 fL (ref 78.0–100.0)

## 2012-07-06 LAB — BASIC METABOLIC PANEL
BUN: 26 mg/dL — ABNORMAL HIGH (ref 6–23)
CO2: 28 mEq/L (ref 19–32)
GFR calc non Af Amer: 61 mL/min — ABNORMAL LOW (ref >90)
Glucose, Bld: 116 mg/dL — ABNORMAL HIGH (ref 70–99)
Potassium: 4.4 mEq/L (ref 3.5–5.1)

## 2012-07-06 MED ORDER — CIPROFLOXACIN HCL 500 MG PO TABS: 500.0000 mg | ORAL_TABLET | Freq: Two times a day (BID) | ORAL | Status: AC

## 2012-07-06 MED ORDER — LOSARTAN POTASSIUM 100 MG PO TABS: 50.0000 mg | ORAL_TABLET | Freq: Every day | ORAL | Status: DC | PRN

## 2012-07-06 MED ORDER — TRAMADOL HCL 50 MG PO TABS
50.0000 mg | ORAL_TABLET | Freq: Four times a day (QID) | ORAL | Status: DC | PRN
  Administered 2012-07-06 – 2012-07-07 (×3): 50 mg via ORAL
  Filled 2012-07-06 (×3): qty 1

## 2012-07-06 MED ORDER — CARVEDILOL 25 MG PO TABS
25.0000 mg | ORAL_TABLET | Freq: Two times a day (BID) | ORAL | Status: DC
  Administered 2012-07-06 (×2): 25 mg via ORAL
  Filled 2012-07-06 (×5): qty 1

## 2012-07-06 MED ORDER — CARVEDILOL 25 MG PO TABS: 25.0000 mg | ORAL_TABLET | Freq: Two times a day (BID) | ORAL | Status: DC

## 2012-07-06 MED ORDER — NICOTINE 14 MG/24HR TD PT24
14.0000 mg | MEDICATED_PATCH | Freq: Every day | TRANSDERMAL | Status: DC
  Administered 2012-07-06 – 2012-07-07 (×2): 14 mg via TRANSDERMAL
  Filled 2012-07-06 (×2): qty 1

## 2012-07-06 MED ORDER — DILTIAZEM HCL ER COATED BEADS 360 MG PO CP24: 360.0000 mg | ORAL_CAPSULE | Freq: Every day | ORAL | Status: DC

## 2012-07-06 MED ORDER — CIPROFLOXACIN HCL 500 MG PO TABS: 500.0000 mg | ORAL_TABLET | Freq: Two times a day (BID) | ORAL | Status: DC

## 2012-07-06 MED ORDER — TRAMADOL HCL 50 MG PO TABS: 50.0000 mg | ORAL_TABLET | Freq: Four times a day (QID) | ORAL | Status: DC | PRN

## 2012-07-06 MED ORDER — FUROSEMIDE 40 MG PO TABS: 40.0000 mg | ORAL_TABLET | Freq: Every day | ORAL | Status: DC

## 2012-07-06 NOTE — Progress Notes (Signed)
Subjective:  No new problems overnight.  No chest pain. Dyspnea slightly improved with improvement in ventricular rate.  Objective:  Vital Signs in the last 24 hours: Temp:  [97.1 F (36.2 C)-97.8 F (36.6 C)] 97.8 F (36.6 C) (08/18 0607) Pulse Rate:  [80-149] 80  (08/18 0607) Resp:  [17-20] 20  (08/18 0607) BP: (105-112)/(60-72) 112/60 mmHg (08/18 0607) SpO2:  [95 %-97 %] 97 % (08/18 0607) Weight:  [162 lb 4.8 oz (73.619 kg)] 162 lb 4.8 oz (73.619 kg) (08/18 0500)  Intake/Output from previous day: 08/17 0701 - 08/18 0700 In: -  Out: 400 [Urine:400] Intake/Output from this shift:       . aspirin EC  81 mg Oral QPC breakfast  . carvedilol  25 mg Oral BID WC  . cefTRIAXone (ROCEPHIN)  IV  1 g Intravenous Q24H  . diltiazem  360 mg Oral Daily  . febuxostat  40 mg Oral Daily  . feeding supplement  237 mL Oral TID BM  . furosemide  40 mg Intravenous Daily  . heparin  5,000 Units Subcutaneous Q8H  . levothyroxine  88 mcg Oral Daily  . LORazepam  0.5 mg Intravenous Once  . losartan  50 mg Oral Daily  . multivitamin with minerals  1 tablet Oral Daily  . nicotine  14 mg Transdermal Daily  . potassium chloride  20 mEq Oral BID  . sodium chloride  3 mL Intravenous Q12H  . DISCONTD: carvedilol  12.5 mg Oral BID WC  . DISCONTD: diltiazem  240 mg Oral Daily      Physical Exam: The patient appears to be in no distress.  Head and neck exam reveals that the pupils are equal and reactive.  The extraocular movements are full.  There is no scleral icterus.  Mouth and pharynx are benign.  No lymphadenopathy.  No carotid bruits.  The jugular venous pressure is normal.  Thyroid is not enlarged or tender.  Chest reveals poor inspiratory effort.  Decrease breath sounds at bases.  Heart reveals no abnormal lift or heave.  First and second heart sounds are normal.  There is no murmur gallop rub or click. Rhythm is irregular  The abdomen is soft and nontender.  Bowel sounds are  normoactive.  There is no hepatosplenomegaly or mass.  There are no abdominal bruits.  Extremities reveal no phlebitis. Mild pretibial edema.    Neurologic exam is normal strength and no lateralizing weakness.  No sensory deficits.  Integument reveals no rash  Lab Results:  Basename 07/06/12 0521 07/05/12 0524  WBC 4.3 4.9  HGB 9.1* 9.5*  PLT 331 333    Basename 07/06/12 0521 07/05/12 0524  NA 142 141  K 4.4 4.7  CL 106 106  CO2 28 27  GLUCOSE 116* 99  BUN 26* 25*  CREATININE 1.10 1.09   No results found for this basename: TROPONINI:2,CK,MB:2 in the last 72 hours Hepatic Function Panel No results found for this basename: PROT,ALBUMIN,AST,ALT,ALKPHOS,BILITOT,BILIDIR,IBILI in the last 72 hours No results found for this basename: CHOL in the last 72 hours No results found for this basename: PROTIME in the last 72 hours 2Imaging: Dg Chest 2 View  07/04/2012  *RADIOLOGY REPORT*  Clinical Data: Shortness of breath, diabetes  CHEST - 2 VIEW  Comparison: 06/29/2012  Findings: Cardiomediastinal silhouette is stable.  Again noted bilateral small pleural effusion with bilateral basilar atelectasis or infiltrate right greater than left.  Small amount of fluid noted right minor fissure. Bony thorax is unremarkable.  IMPRESSION:  Again noted bilateral small pleural effusion with bilateral basilar atelectasis or infiltrate right greater than left.  Small amount of fluid noted right minor fissure.  Original Report Authenticated By: Natasha Mead, M.D.    Cardiac Studies: Echo EF 50-55% Telemetry shows atrial flutter with rapid VR. Assessment/Plan:   Atrial flutter (06/30/2012)   Assessment: Heart rate improving but still not optimal.   Plan: Continue diltiazem 360 mg daily. Increase coreg to 25 mg BID. Not a coumadin candidate. Continue ASA  LOS: 7 days    Cassell Clement 07/06/2012, 8:04 AM

## 2012-07-06 NOTE — Progress Notes (Signed)
Met with patient and patients daughter who told me discharge plan set for tomorrow. I educated her on hospice and palliative care and provided information. Patient and family have never had full goals conversation which would need to be scheduled, he is still a full code and there is little or no understanding about what that means, daughter open to [facility]. PCS team can facilitate discussion if d/c is planned for Monday. Will follow.

## 2012-07-06 NOTE — Plan of Care (Signed)
Problem: Discharge Progression Outcomes Goal: Barriers To Progression Addressed/Resolved Outcome: Not Progressing Patient very anxious and implusive with movements in bed. Patient unable to sleep. Patient is a current smoker and has not had any nicotine since admission. Will make MD aware

## 2012-07-06 NOTE — Discharge Summary (Addendum)
Physician Discharge Summary  Deni Lefever MRN: [id number] DOB/AGE: [birth year] 78 y.o.  PCP: Laurena Slimmer, MD   Admit date: 06/29/2012 Discharge date: 07/06/2012  Discharge Diagnoses:  Subacute delirium CVA   *Atrial flutter Active Problems:  Anemia  Myeloproliferative disorder  Shortness of breath  Volume overload  UTI  Hypothyroidism   Medication List  As of 07/06/2012 10:31 PM   STOP taking these medications         AZOR 5-20 MG per tablet         TAKE these medications         aspirin 81 MG tablet   Take 81 mg by mouth daily after breakfast.      carvedilol 25 MG tablet   Commonly known as: COREG   Take 1 tablet (25 mg total) by mouth 2 (two) times daily with a meal.      ciprofloxacin 500 MG tablet   Commonly known as: CIPRO   Take 1 tablet (500 mg total) by mouth 2 (two) times daily.      ciprofloxacin 500 MG tablet   Commonly known as: CIPRO   Take 1 tablet (500 mg total) by mouth 2 (two) times daily.      diltiazem 360 MG 24 hr capsule   Commonly known as: CARDIZEM CD   Take 1 capsule (360 mg total) by mouth daily.      furosemide 40 MG tablet   Commonly known as: LASIX   Take 1 tablet (40 mg total) by mouth daily.      hydrocodone-acetaminophen 5-500 MG per capsule   Commonly known as: LORCET-HD   Take 1 capsule by mouth every 6 (six) hours as needed for pain.      levothyroxine 88 MCG tablet   Commonly known as: SYNTHROID, LEVOTHROID   Take 88 mcg by mouth daily.      losartan 100 MG tablet   Commonly known as: COZAAR   Take 0.5 tablets (50 mg total) by mouth daily as needed. For swelling      prenatal multivitamin Tabs   Take 1 tablet by mouth daily.      traMADol 50 MG tablet   Commonly known as: ULTRAM   Take 1 tablet (50 mg total) by mouth every 6 (six) hours as needed.      ULORIC 40 MG tablet   Generic drug: febuxostat   Take 40 mg by mouth daily.            Discharge Condition:    Disposition:    Consults:      Significant Diagnostic Studies: Dg Chest 2 View  07/04/2012  *RADIOLOGY REPORT*  Clinical Data: Shortness of breath, diabetes  CHEST - 2 VIEW  Comparison: 06/29/2012  Findings: Cardiomediastinal silhouette is stable.  Again noted bilateral small pleural effusion with bilateral basilar atelectasis or infiltrate right greater than left.  Small amount of fluid noted right minor fissure. Bony thorax is unremarkable.  IMPRESSION:  Again noted bilateral small pleural effusion with bilateral basilar atelectasis or infiltrate right greater than left.  Small amount of fluid noted right minor fissure.  Original Report Authenticated By: Natasha Mead, M.D.   Ct Head Wo Contrast  07/02/2012  *RADIOLOGY REPORT*  Clinical Data: Confusion.  Evaluate for potential CVA.  CT HEAD WITHOUT CONTRAST  Technique:  Contiguous axial images were obtained from the base of the skull through the vertex without contrast.  Comparison: No priors.  Findings: Mild cerebral and cerebellar atrophy is age appropriate.  There are patchy and confluent areas of decreased attenuation throughout the deep and periventricular white matter of the cerebral hemispheres bilaterally, most compatible with chronic microvascular ischemic disease.  In the left subinsular region there is a slightly ill-defined area of decreased attenuation which could be concerning for an area of subacute ischemia.  No acute intracranial hemorrhage, focal mass, mass effect, hydrocephalus or abnormal intra or extra-axial fluid collections.  No acute displaced skull fractures are identified.  Visualized paranasal sinuses and mastoids are well pneumatized.  IMPRESSION: 1.  Ill-defined area of low attenuation in the left subinsular region could represent an area of age indeterminate (potentially subacute) ischemia.  This could be further delineated with MRI of the brain if clinically indicated. 2.  Mild cerebral and cerebellar atrophy is age appropriate. 3.  Chronic microvascular  ischemic changes in the white matter of the brain, as above.  Original Report Authenticated By: Florencia Reasons, M.D.   Chest Portable 1 View  06/29/2012  *RADIOLOGY REPORT*  Clinical Data: Shortness of breath, cough  PORTABLE CHEST - 1 VIEW  Comparison: 12/24/2006  Findings: Low lung volumes.  Small bilateral pleural effusions. Patchy bibasilar consolidation or atelectasis.  Mild diffuse interstitial edema/infiltrates.  Heart size upper limits normal for technique.  IMPRESSION:  1.  Bibasilar infiltrates or edema with small effusions.  Original Report Authenticated By: Thora Lance III, M.D.     ECHO  LV EF: 50% - 55%  ------------------------------------------------------------ Indications: CHF - 428.0.  ------------------------------------------------------------ History: PMH: Dyspnea. Atrial flutter. Risk factors: Anemia. Myeloproliferative disorder. Current tobacco use.  ------------------------------------------------------------ Study Conclusions  - Left ventricle: The cavity size was normal. There was mild concentric hypertrophy. Systolic function was normal. The estimated ejection fraction was in the range of 50% to 55%. Although no diagnostic regional wall motion abnormality was identified, this possibility cannot be completely excluded on the basis of this study. Doppler parameters are consistent with abnormal left ventricular relaxation (grade 1 diastolic dysfunction). - Aortic valve: Mild regurgitation. - Mitral valve: Calcified annulus. Mild to moderate regurgitation. - Left atrium: The atrium was moderately dilated. - Right atrium: The atrium was mildly dilated. - Pericardium, extracardiac: There was a left pleural effusion.   Microbiology: Recent Results (from the past 240 hour(s))  TECHNOLOGIST REVIEW     Status: Normal   Collection Time   06/27/12  8:41 AM      Component Value Range Status Comment   Technologist Review rare blast   Final   URINE  CULTURE     Status: Normal   Collection Time   07/01/12  8:53 PM      Component Value Range Status Comment   Specimen Description URINE, RANDOM   Final    Special Requests NONE   Final    Culture  Setup Time 07/02/2012 02:12   Final    Colony Count >=100,000 COLONIES/ML   Final    Culture     Final    Value: KLEBSIELLA PNEUMONIAE     CITROBACTER FREUNDII   Report Status 07/05/2012 FINAL   Final    Organism ID, Bacteria KLEBSIELLA PNEUMONIAE   Final    Organism ID, Bacteria CITROBACTER FREUNDII   Final      Labs: Results for orders placed during the hospital encounter of 06/29/12 (from the past 48 hour(s))  BASIC METABOLIC PANEL     Status: Abnormal   Collection Time   07/05/12  5:24 AM      Component Value Range Comment   Sodium  141  135 - 145 mEq/L    Potassium 4.7  3.5 - 5.1 mEq/L    Chloride 106  96 - 112 mEq/L    CO2 27  19 - 32 mEq/L    Glucose, Bld 99  70 - 99 mg/dL    BUN 25 (*) 6 - 23 mg/dL    Creatinine, Ser 1.61  0.50 - 1.35 mg/dL    Calcium 8.8  8.4 - 09.6 mg/dL    GFR calc non Af Amer 61 (*) >90 mL/min    GFR calc Af Amer 71 (*) >90 mL/min   CBC     Status: Abnormal   Collection Time   07/05/12  5:24 AM      Component Value Range Comment   WBC 4.9  4.0 - 10.5 K/uL    RBC 3.17 (*) 4.22 - 5.81 MIL/uL    Hemoglobin 9.5 (*) 13.0 - 17.0 g/dL    HCT 04.5 (*) 40.9 - 52.0 %    MCV 93.7  78.0 - 100.0 fL    MCH 30.0  26.0 - 34.0 pg    MCHC 32.0  30.0 - 36.0 g/dL    RDW 81.1 (*) 91.4 - 15.5 %    Platelets 333  150 - 400 K/uL   BASIC METABOLIC PANEL     Status: Abnormal   Collection Time   07/06/12  5:21 AM      Component Value Range Comment   Sodium 142  135 - 145 mEq/L    Potassium 4.4  3.5 - 5.1 mEq/L    Chloride 106  96 - 112 mEq/L    CO2 28  19 - 32 mEq/L    Glucose, Bld 116 (*) 70 - 99 mg/dL    BUN 26 (*) 6 - 23 mg/dL    Creatinine, Ser 7.82  0.50 - 1.35 mg/dL    Calcium 8.7  8.4 - 95.6 mg/dL    GFR calc non Af Amer 61 (*) >90 mL/min    GFR calc Af Amer 70  (*) >90 mL/min   CBC     Status: Abnormal   Collection Time   07/06/12  5:21 AM      Component Value Range Comment   WBC 4.3  4.0 - 10.5 K/uL    RBC 3.12 (*) 4.22 - 5.81 MIL/uL    Hemoglobin 9.1 (*) 13.0 - 17.0 g/dL    HCT 21.3 (*) 08.6 - 52.0 %    MCV 93.6  78.0 - 100.0 fL    MCH 29.2  26.0 - 34.0 pg    MCHC 31.2  30.0 - 36.0 g/dL    RDW 57.8 (*) 46.9 - 15.5 %    Platelets 331  150 - 400 K/uL PLATELET COUNT CONFIRMED BY SMEAR     HPI :79 year old African American male with past medical history of myeloproliferative disorder, HTN, DM and hypothyroidism. He brought to the hospital by his family because of progressive worsening of shortness of breath. Patient said for the past several weeks he had shortness of breath and he thought initially it was secondary to his profound anemia, please note he has myeloproliferative disorder and he is transfusion dependent, he is getting transfusion of 2 units of blood every other week. 2 days ago he called his oncologist, and he received transfusion of 2 units of packed RBCs, his symptoms did not resolve, his daughter noticed that he sleeps sitting up, he also has some swelling in his legs so she brought him to  the hospital for further evaluation. Upon initial evaluation in the emergency department he has proBNP of over 7800 and his chest x-ray showed bibasilar opacities consistent with acute CHF. He also has heart rate of 150, it was thought initially to be SVT, adenosine was given and it looks like a flutter now.   HOSPITAL COURSE New onset atrial flutter--Uncontrolled again Cardiology consulted . Treated with Cardizem drip during this admission. . Started on PO coreg and diltiazem with uptitration of dose on a daily basis .   Remains in atrial flutter with rapid VR 126 , poorly controlled , now with pauses . Cardioversion not possible without full dose anticoagulation. No further recommendations from cardiology at this point . TSH normal, echocardiogram  noted. Not a candidate for anticoagulation candidate secondary to transfusion-dependent myeloproliferative disorder (discussed with Dr. Clelia Croft). I also discussed this with the patient's daughter was currently by the bedside, about  His poor prognosis on a daily basis as well as his risk of CVA  ,  Continue aspirin. Cardiology following.now on  carvedilol to 25 mg BID. And  diltiazem to 360 mg by mouth daily     Volume overload--Continue po Lasix. Daily weights, I&O. History most suggestive of tachycardia-induced overload rather than diastolic CHF.    Subacute delirium-secondary to UTI?CVA ? Waxing and waning .[name] Kitchen By history present ~1 week, family suspects secondary to pain medication. Please avoid sedating medications  No history of dementia. CT head without contrast suggestive of subacute CVA. No further workup will be done at the management will not change. Patient is not a candidate for anticoagulation  PT/OT/speech , and recommended SNF    Anemia, Myeloproliferative disorder--hemoglobin stable. Transfuse for Hgb ~6. Dr. Clelia Croft notified of admission. Hemoglobin at baseline  Diabetes mellitus--diet controlled? CBG well-controlled. No need for SSI.  Hypothyroidism--TSH normal. Continue levothyroxine   Shortness of breath likely secondary to overt aspiration given altered mental status vs CHF . Chest x-ray shows bilateral infiltrates likely secondary to volume overload.. Could also be secondary to his atrial flutter, his rate was 140s this morning, Coreg has been increased by cardiology , also started on po lasix . Now improved    Urinary tract infection due to Klebsiella and Citrobacter started Rocephin yesterday , switch to PO ciprofloxacin for another 7 days   Disposition Discussed with family about poor prognosis , Palliative care consult ordered . Will be assessed at SNF vs home with  hospice  Goals of care meeting pending , DC after this is completed      Discharge Exam: Blood  pressure 91/50, pulse 69, temperature 98.4 F (36.9 C), temperature source Oral, resp. rate 16, height 5\' 8"  (1.727 m), weight 73.619 kg (162 lb 4.8 oz), SpO2 93.00%.  Head and neck exam reveals that the pupils are equal and reactive. The extraocular movements are full. There is no scleral icterus  . Mouth and pharynx are benign. No lymphadenopathy. No carotid bruits. The jugular venous pressure is normal. Thyroid is not enlarged or tender.   Chest reveals poor inspiratory effort. Decrease breath sounds at bases.   Heart reveals no abnormal lift or heave. First and second heart sounds are normal. There is no murmur gallop rub or click. Rhythm is irregular   The abdomen is soft and nontender. Bowel sounds are normoactive. There is no hepatosplenomegaly or mass. There are no abdominal bruits.   Extremities reveal no phlebitis. Mild pretibial edema.   Neurologic exam is normal strength and no lateralizing weakness. No sensory deficits.  Integument reveals no rash    Discharge Orders    Future Appointments: Provider: Department: Dept Phone: Center:   07/11/2012 8:45 AM Windell Hummingbird [facility] [phone] None   07/11/2012 9:15 AM Myrtis Ser, NP [facility] [phone] None   07/11/2012 10:15 AM [facility] [phone] None   07/25/2012 8:00 AM Krista Blue [facility] [phone] None   07/25/2012 9:00 AM [facility] D13 [facility] [phone] None   08/08/2012 8:45 AM Windell Hummingbird [facility] [phone] None   08/08/2012 9:15 AM Myrtis Ser, NP [facility] [phone] None   08/08/2012 10:15 AM [facility] A[facility] [phone] None        Signed: Richarda Overlie 07/06/2012, 10:31 PM

## 2012-07-06 NOTE — Progress Notes (Addendum)
[facility] PROGRESS NOTE  Kyle Heath ZOX:[id number] DOB: [birth year] DOA: 06/29/2012 PCP: Laurena Slimmer, MD  Assessment/Plan: Principal Problem:  *Atrial flutter Active Problems:  Anemia  Myeloproliferative disorder  Shortness of breath  Volume overload  Subacute delirium  Hypothyroidism  [facility]  PROGRESS NOTE  Kyle Heath WJX:[id number] DOB: July 24, 1927 DOA: 06/29/2012  PCP: Laurena Slimmer, MD  Assessment/Plan:  Principal Problem:  *Atrial flutter  Active Problems:  Anemia  Myeloproliferative disorder  Shortness of breath  Volume overload  Subacute delirium  Hypothyroidism   New onset atrial flutter--Uncontrolled again (despite Coreg). Resumed Cardizem infusion yesterday. Remains in atrial flutter with rapid VR 126 , 2.36 sec pauses   TSH normal, echocardiogram noted. Not a candidate for anticoagulation candidate secondary to transfusion-dependent myeloproliferative disorder (discussed with Dr. Clelia Croft). I also discussed this with the patient's daughter was currently by the bedside, Continue aspirin. Cardiology following.Will increase carvedilol to 25 mg BID. Increase diltiazem to 360 mg by mouth daily  Volume overload--Continue IV Lasix. Daily weights, I&O. History most suggestive of tachycardia-induced overload rather than diastolic CHF.  Subacute delirium-secondary to UTI?-much improved today. By history present ~1 week, family suspects secondary to pain medication. Please avoid sedating medications overnight. No history of dementia. CT head without contrast suggestive of subacute CVA. No further workup will be done at the management will not change. Patient is not a candidate for anticoagulation  PT/OT/speech , and recommended SNF  Anemia, Myeloproliferative disorder--hemoglobin stable. Transfuse for Hgb ~6. Dr. Clelia Croft notified of admission. Hemoglobin at baseline  Diabetes mellitus--diet controlled? CBG well-controlled. No need for SSI.    Hypothyroidism--TSH normal. Continue levothyroxine  Shortness of breath likely secondary to overt aspiration given altered mental status. Chest x-ray shows bilateral infiltrates likely secondary to volume overload.. Could also be secondary to his atrial flutter, his rate was 140s this morning, Coreg has been increased by cardiology , IV Lasix  Urinary tract infection due to Klebsiella and Citrobacter started Rocephin yesterday    Code Status: full  Family Communication: family updated about patient's clinical progress  Disposition Plan: Skilled nursing facility tomorrow vs home with hospice on Monday , palliative care consult for goals of care   HPI/Subjective:  Patient slept poorly last night, and is lethargic this am. Not eating well according to family.  Remains in atrial flutter with rapid VR 140s this am.  Received Ativan last night because of agitation    Objective: Filed Vitals:   07/05/12 1338 07/05/12 2029 07/06/12 0500 07/06/12 0607  BP: 105/67 108/72  112/60  Pulse: 149 118  80  Temp: 97.8 F (36.6 C) 97.1 F (36.2 C)  97.8 F (36.6 C)  TempSrc:  Axillary  Oral  Resp: 17 18  20   Height:      Weight:   73.619 kg (162 lb 4.8 oz)   SpO2: 96% 95%  97%    Intake/Output Summary (Last 24 hours) at 07/06/12 0930 Last data filed at 07/06/12 0921  Gross per 24 hour  Intake    120 ml  Output    400 ml  Net   -280 ml    Exam:  HENT:  Head: Atraumatic.  Nose: Nose normal.  Mouth/Throat: Oropharynx is clear and moist.  Eyes: Conjunctivae are normal. Pupils are equal, round, and reactive to light. No scleral icterus.  Neck: Neck supple. No tracheal deviation present.  Cardiovascular: Normal rate, regular rhythm, normal heart sounds and intact distal pulses.  Pulmonary/Chest: Effort normal and breath sounds normal. No respiratory distress.  Abdominal: Soft. Normal appearance and bowel sounds are normal. She exhibits no distension. There is no tenderness.   Musculoskeletal: She exhibits no edema and no tenderness.  Neurological: She is alert. No cranial nerve deficit.    Data Reviewed: Basic Metabolic Panel:  Lab 07/06/12 1610 07/05/12 0524 07/04/12 0448 07/03/12 0433 07/02/12 0459 06/29/12 2215  NA 142 141 141 143 143 --  K 4.4 4.7 4.8 4.8 4.7 --  CL 106 106 109 109 109 --  CO2 28 27 22 26 22  --  GLUCOSE 116* 99 97 107* 110* --  BUN 26* 25* 24* 29* 28* --  CREATININE 1.10 1.09 1.10 1.13 1.14 --  CALCIUM 8.7 8.8 8.6 9.0 8.7 --  MG -- -- -- -- -- 2.1  PHOS -- -- -- -- -- --    Liver Function Tests:  Lab 06/29/12 2215  AST 15  ALT 12  ALKPHOS 72  BILITOT 0.4  PROT 6.8  ALBUMIN 2.9*   No results found for this basename: LIPASE:5,AMYLASE:5 in the last 168 hours No results found for this basename: AMMONIA:5 in the last 168 hours  CBC:  Lab 07/06/12 0521 07/05/12 0524 07/04/12 0448 07/03/12 0433 07/02/12 0459  WBC 4.3 4.9 5.2 5.4 5.0  NEUTROABS -- -- -- -- --  HGB 9.1* 9.5* 9.4* 9.7* 9.0*  HCT 29.2* 29.7* 30.3* 30.5* 28.4*  MCV 93.6 93.7 93.8 93.3 93.4  PLT 331 333 388 363 402*    Cardiac Enzymes:  Lab 06/30/12 1910 06/30/12 1045 06/30/12 0320  CKTOTAL 37 51 58  CKMB 3.0 3.8 3.9  CKMBINDEX -- -- --  TROPONINI <0.30 <0.30 <0.30   BNP (last 3 results)  Basename 06/29/12 2200  PROBNP 7889.0*     CBG:  Lab 06/29/12 2155  GLUCAP 94    Recent Results (from the past 240 hour(s))  TECHNOLOGIST REVIEW     Status: Normal   Collection Time   06/27/12  8:41 AM      Component Value Range Status Comment   Technologist Review rare blast   Final   URINE CULTURE     Status: Normal   Collection Time   07/01/12  8:53 PM      Component Value Range Status Comment   Specimen Description URINE, RANDOM   Final    Special Requests NONE   Final    Culture  Setup Time 07/02/2012 02:12   Final    Colony Count >=100,000 COLONIES/ML   Final    Culture     Final    Value: KLEBSIELLA PNEUMONIAE     CITROBACTER FREUNDII    Report Status 07/05/2012 FINAL   Final    Organism ID, Bacteria KLEBSIELLA PNEUMONIAE   Final    Organism ID, Bacteria CITROBACTER FREUNDII   Final      Studies: Dg Chest 2 View  07/04/2012  *RADIOLOGY REPORT*  Clinical Data: Shortness of breath, diabetes  CHEST - 2 VIEW  Comparison: 06/29/2012  Findings: Cardiomediastinal silhouette is stable.  Again noted bilateral small pleural effusion with bilateral basilar atelectasis or infiltrate right greater than left.  Small amount of fluid noted right minor fissure. Bony thorax is unremarkable.  IMPRESSION:  Again noted bilateral small pleural effusion with bilateral basilar atelectasis or infiltrate right greater than left.  Small amount of fluid noted right minor fissure.  Original Report Authenticated By: Natasha Mead, M.D.   Ct Head Wo Contrast  07/02/2012  *RADIOLOGY REPORT*  Clinical Data: Confusion.  Evaluate for potential CVA.  CT HEAD WITHOUT CONTRAST  Technique:  Contiguous axial images were obtained from the base of the skull through the vertex without contrast.  Comparison: No priors.  Findings: Mild cerebral and cerebellar atrophy is age appropriate. There are patchy and confluent areas of decreased attenuation throughout the deep and periventricular white matter of the cerebral hemispheres bilaterally, most compatible with chronic microvascular ischemic disease.  In the left subinsular region there is a slightly ill-defined area of decreased attenuation which could be concerning for an area of subacute ischemia.  No acute intracranial hemorrhage, focal mass, mass effect, hydrocephalus or abnormal intra or extra-axial fluid collections.  No acute displaced skull fractures are identified.  Visualized paranasal sinuses and mastoids are well pneumatized.  IMPRESSION: 1.  Ill-defined area of low attenuation in the left subinsular region could represent an area of age indeterminate (potentially subacute) ischemia.  This could be further delineated with MRI  of the brain if clinically indicated. 2.  Mild cerebral and cerebellar atrophy is age appropriate. 3.  Chronic microvascular ischemic changes in the white matter of the brain, as above.  Original Report Authenticated By: Florencia Reasons, M.D.   Chest Portable 1 View  06/29/2012  *RADIOLOGY REPORT*  Clinical Data: Shortness of breath, cough  PORTABLE CHEST - 1 VIEW  Comparison: 12/24/2006  Findings: Low lung volumes.  Small bilateral pleural effusions. Patchy bibasilar consolidation or atelectasis.  Mild diffuse interstitial edema/infiltrates.  Heart size upper limits normal for technique.  IMPRESSION:  1.  Bibasilar infiltrates or edema with small effusions.  Original Report Authenticated By: Osa Craver, M.D.    Scheduled Meds:   . aspirin EC  81 mg Oral QPC breakfast  . carvedilol  25 mg Oral BID WC  . cefTRIAXone (ROCEPHIN)  IV  1 g Intravenous Q24H  . diltiazem  360 mg Oral Daily  . febuxostat  40 mg Oral Daily  . feeding supplement  237 mL Oral TID BM  . furosemide  40 mg Intravenous Daily  . heparin  5,000 Units Subcutaneous Q8H  . levothyroxine  88 mcg Oral Daily  . LORazepam  0.5 mg Intravenous Once  . losartan  50 mg Oral Daily  . multivitamin with minerals  1 tablet Oral Daily  . nicotine  14 mg Transdermal Daily  . potassium chloride  20 mEq Oral BID  . sodium chloride  3 mL Intravenous Q12H  . DISCONTD: carvedilol  12.5 mg Oral BID WC   Continuous Infusions:   Principal Problem:  *Atrial flutter Active Problems:  Anemia  Myeloproliferative disorder  Shortness of breath  Volume overload  Subacute delirium  Hypothyroidism    Time spent: 40 minutes   [facility]  [facility] Pager [phone]. If 8PM-8AM, please contact night-coverage at [email or web address], password [facility] 07/06/2012, 9:30 AM  LOS: 7 days

## 2012-07-07 DIAGNOSIS — R531 Weakness: Secondary | ICD-10-CM

## 2012-07-07 DIAGNOSIS — R52 Pain, unspecified: Secondary | ICD-10-CM

## 2012-07-07 DIAGNOSIS — R5383 Other fatigue: Secondary | ICD-10-CM

## 2012-07-07 MED ORDER — HYDROCODONE-ACETAMINOPHEN 5-500 MG PO CAPS: 1.0000 | ORAL_CAPSULE | Freq: Three times a day (TID) | ORAL | Status: AC | PRN

## 2012-07-07 MED ORDER — DILTIAZEM HCL ER COATED BEADS 360 MG PO CP24: 360.0000 mg | ORAL_CAPSULE | Freq: Every day | ORAL | Status: DC

## 2012-07-07 MED ORDER — CIPROFLOXACIN HCL 500 MG PO TABS: 500.0000 mg | ORAL_TABLET | Freq: Two times a day (BID) | ORAL | Status: AC

## 2012-07-07 MED ORDER — LOSARTAN POTASSIUM 100 MG PO TABS: 50.0000 mg | ORAL_TABLET | Freq: Every day | ORAL | Status: DC | PRN

## 2012-07-07 MED ORDER — FUROSEMIDE 40 MG PO TABS: 40.0000 mg | ORAL_TABLET | Freq: Every day | ORAL | Status: DC

## 2012-07-07 MED ORDER — TRAMADOL HCL 50 MG PO TABS: 50.0000 mg | ORAL_TABLET | Freq: Four times a day (QID) | ORAL | Status: AC | PRN

## 2012-07-07 MED ORDER — CARVEDILOL 25 MG PO TABS: 25.0000 mg | ORAL_TABLET | Freq: Two times a day (BID) | ORAL | Status: DC

## 2012-07-07 NOTE — Progress Notes (Signed)
Occupational Therapy Treatment Patient Details Name: Kyle Heath MRN: [id number] DOB: [birth year] Today's Date: 07/07/2012 Time: 0950-1001 OT Time Calculation (min): 11 min  OT Assessment / Plan / Recommendation Comments on Treatment Session Family prsesent. Plan is for SNF now. Pt mobilizing from bed to chair well with min guard assist.    Follow Up Recommendations  Skilled nursing facility    Barriers to Discharge       Equipment Recommendations  Defer to next venue    Recommendations for Other Services    Frequency Min 2X/week   Plan Discharge plan needs to be updated    Precautions / Restrictions Precautions Precautions: Fall Precaution Comments: monitor HR Restrictions Weight Bearing Restrictions: No        ADL  Toilet Transfer: Simulated;Min guard Toilet Transfer Method: Stand pivot;Other (comment) (up to chair only. Dizzy at EOB and HR up. HR 133 at EOB and 144 after transfer to chair. BP 95/56 at EOB and 110/58 in chair.) ADL Comments: No device used for transfer to chair. Plan was to brush teeth in bathroom but pt dizzy at EOB and HR still elevated. Up to chair only and sat at [facility] for about 5 minutes to get vitals. Pt ready to eat breakfast brought in by family so did not have pt brush teeth at [facility] as he was ready to eat. Limited by dizziness and HR this visit.     OT Diagnosis:    OT Problem List:   OT Treatment Interventions:     OT Goals ADL Goals ADL Goal: Toilet Transfer - Progress: Progressing toward goals  Visit Information  Last OT Received On: 07/07/12 Assistance Needed: +1 PT/OT Co-Evaluation/Treatment: Yes    Subjective Data  Subjective: I am hungry Patient Stated Goal: agreeable up to chair with therapy; none stated on his own   Prior Functioning       Cognition  Overall Cognitive Status: Impaired Area of Impairment: Memory;Safety/judgement Arousal/Alertness: Awake/alert Behavior During Session: [facility] for tasks performed    Mobility Bed  Mobility Bed Mobility: Supine to Sit Supine to Sit: 5: Supervision;HOB elevated Transfers Transfers: Sit to Stand;Stand to Sit Sit to Stand: 4: Min guard;With upper extremity assist Stand to Sit: 4: Min guard;With upper extremity assist   Exercises    Balance    End of Session OT - End of Session Activity Tolerance: Other (comment) (by dizziness and elevated HR)  GO     [name] [phone] 07/07/2012, 10:24 AM

## 2012-07-07 NOTE — Progress Notes (Signed)
Follow-up -Palliative Medicine Team consult requested by Dr Susie Cassette- spoke with Dr Susie Cassette who confirms she would like goals of care conversation, if possible, before patient is discharged today - spoke with patient and daughters, Einar Grad and Misty Stanley at bedside - Misty Stanley lives with patient at his home - she stated she felt patient was doing better this morning and wants to take him home (instead of re-hab) as this is what he wants- she is will to speak with PMT provider and indicated she has sp[oken with Olegario Messier [facility] to arrange [facility] follow-up after d/c.  PMT provider able to meet with family today between 1:30-2 pm and patient and daughters agreeable   Valente David, RN 07/07/2012, 12:57 PM Palliative Medicine Team RN Liaison [phone]

## 2012-07-07 NOTE — Progress Notes (Signed)
Physical Therapy Treatment Patient Details Name: Kyle Heath MRN: [id number] DOB: 1927-07-08 Today's Date: 07/07/2012 Time: 0950-1001 PT Time Calculation (min): 11 min  PT Assessment / Plan / Recommendation Comments on Treatment Session  Increased HR of 144 with bed to chair transfer, gait deferred. RN notified. No LOB with sitting on EOB, no LOB with bed to chair transfer.     Follow Up Recommendations  Skilled nursing facility;Home health PT (HHPT if family chooses home)    Barriers to Discharge        Equipment Recommendations  3 in 1 bedside comode;Tub/shower seat    Recommendations for Other Services    Frequency Min 3X/week   Plan Discharge plan remains appropriate;Frequency remains appropriate    Precautions / Restrictions Precautions Precautions: Fall Precaution Comments: monitor HR Restrictions Weight Bearing Restrictions: No   Pertinent Vitals/Pain *0/10 pain HR 107 at rest, 144 with bed to chair transfer, RN notified**    Mobility  Bed Mobility Bed Mobility: Supine to Sit Supine to Sit: 5: Supervision;HOB elevated Transfers Transfers: Stand Pivot Transfers Sit to Stand: 4: Min guard;With upper extremity assist Stand to Sit: 4: Min guard;With upper extremity assist Stand Pivot Transfers: 4: Min guard Details for Transfer Assistance: min/guard due to increased HR and reported dizziness, no LOB Ambulation/Gait Ambulation/Gait Assistance: Not tested (comment)    Exercises     PT Diagnosis:    PT Problem List:   PT Treatment Interventions:     PT Goals Acute Rehab PT Goals PT Goal Formulation: With patient Time For Goal Achievement: 07/09/12 Potential to Achieve Goals: Good Pt will go Sit to Stand: with supervision PT Goal: Sit to Stand - Progress: Progressing toward goal Pt will go Stand to Sit: with supervision PT Goal: Stand to Sit - Progress: Progressing toward goal Pt will Ambulate: >150 feet;with supervision;with least restrictive assistive  device PT Goal: Ambulate - Progress: Not progressing (gait deferred 2* increased HR) Pt will Perform Home Exercise Program: with supervision, verbal cues required/provided  Visit Information  Last PT Received On: 07/07/12 Assistance Needed: +1 PT/OT Co-Evaluation/Treatment: Yes    Subjective Data  Subjective: I'm a little dizzy. (after supine to sit) Patient Stated Goal: none stated   Cognition  Overall Cognitive Status: Impaired Area of Impairment: Memory;Safety/judgement Arousal/Alertness: Awake/alert Behavior During Session: [facility] for tasks performed    Balance  Balance Balance Assessed: Yes Static Sitting Balance Static Sitting - Balance Support: Right upper extremity supported;Feet supported Static Sitting - Level of Assistance: 6: Modified independent (Device/Increase time) Static Sitting - Comment/# of Minutes: 5  End of Session PT - End of Session Equipment Utilized During Treatment: Gait belt Activity Tolerance: Treatment limited secondary to medical complications (Comment) (increased HR of 144 with bed to chair) Patient left: in chair;with call bell/phone within reach;with family/visitor present   GP     Tamala Ser 07/07/2012, 10:41 AM [phone]

## 2012-07-07 NOTE — Progress Notes (Signed)
   SUBJECTIVE:  Heart rate increased to 140s with minimal ambulation.     PHYSICAL EXAM Filed Vitals:   07/06/12 1606 07/06/12 2143 07/07/12 0105 07/07/12 0508  BP: 92/57 91/50 98/59  93/43  Pulse: 108 69 113 107  Temp: 98.7 F (37.1 C) 98.4 F (36.9 C)  98.5 F (36.9 C)  TempSrc: Oral Oral  Oral  Resp: 20 16  16   Height:      Weight:    162 lb 7.7 oz (73.7 kg)  SpO2: 88% 93% 98% 91%   General:  No distress Lungs:  Clear Heart:  Irregular Abdomen:  Positive bowel sounds, no rebound no guarding Extremities:  Mild edema.  LABS:  Intake/Output Summary (Last 24 hours) at 07/07/12 1191 Last data filed at 07/07/12 0102  Gross per 24 hour  Intake    460 ml  Output    125 ml  Net    335 ml    ASSESSMENT AND PLAN:   Atrial Flutter:  Rate still increases with movement or agitation.  However, there were no pauses or sustained tachycardia.  Without anticoagulation, ablation and cardioversion are not possible.  In addition the patients daughter would prefer conservative management.  No change to therapy at this point.    Rollene Rotunda 07/07/2012 8:22 AM

## 2012-07-07 NOTE — Progress Notes (Signed)
Pt up to BR with assistance. HR increased to 140's. Vital signs obtained (see flowsheet) were stable.  Pt was asymptomatic. Assisted pt back to bed and HR decreased to 90-100.  Notified L. Harduk via text page. Maeola Harman

## 2012-07-07 NOTE — Consult Note (Signed)
Patient ZO:XWRUEAV Gildersleeve      DOB: December 16, 1926      WUJ:[id number]     Consult Note from the Palliative Medicine Team at [facility]    Consult Requested by: Dr Jeanella Anton     PCP: Laurena Slimmer, MD Reason for Consultation: Goals of Care    Phone Number:[phone]  Assessment of patients Current state: Patient alert, but mildly confused, attention noted to divert from topic of discussion. Sitting up on edge of bed awaiting discharge back home. In no apparent distress, respirations unlabored, denied any present discomfort.  Palliative care meeting after reviewing chart, patient and two daughters Erminio Nygard and [address]. Patient daughter Misty Stanley has been living with patient in his home for the past several years. We talked about patients status over past 6 months, as well as patients past and current health related issues. Both daughters agree that over past 6 mths father has slowed down, and recently made decision that he could not safely drive his car anymore. We talked in depth about his MPD( currently transfusion dependant), and the fact that with progression that eventually he will become resistant to blood transfusions and what that would cause (increased fatigue/weakness, cardiac events, possible decrease in ability to fight infections, and increase bleeding susceptibility).   Currently the family does not have a formal HPOA designated or have advanced directives in place. We discussed the risks and benefits of various medical interventions, such as compressions, cardioversion, intubation, the use of antibiotics, intravenous hydration and artificial tube feedings. Both daughters and the patient verbalized that they would want full scope of treatment for any health insults that would occur and that they would to come to the hospital for evaluation and treatment in the future. Daughter Misty Stanley given Hard Choices booklet.  Goals of Care: 1.  Code Status: Full-family and patient desire full  scope of treatment in the event of a cardiac or respiratory arrest, use of pressors and anti-arrhythmics for low blood pressure and cardiac rhythm abnormalities if indicated.    2. Scope of Treatment: 1. Nutritional Support/Tube Feeds: want all interventions initiated, including feeding tube for defined trial period 2. Antibiotics: would like to have antibiotics used when an infection occurs 3. Blood Products: continue transfusion support as indicated for low blood counts 4. IVF: use intravenous hydration for defined period of time 5. Labs: continue to have labs drawn as needed 6. Telemetry: use cardiac monitoring if indicated   4. Disposition:plan is to return home to live with daughter Misty Stanley, with home health support   3. Symptom Management:  1) Pain: generalized, states he hurst all over, currently controlled with Tramadol as needed  2) Fatigue/Weakness: most likely due MPD, and transfusion dependence. Educated patient to space self with frequent rest periods.  4. Psychosocial:Emotional support to patient and family  5. Spiritual: declined  Patient Documents Completed or Given: Document Given Completed  Advanced Directives Pkt    MOST  YES  DNR  NO  Gone from My Sight    Hard Choices  YES    Brief HPI: 78 yo AAM with PMH of myeloproliferative marrow disorder, HTN, DM and hypothyroidism. Patient was admitted via ER on 06/29/12 with progression in SOB and LE edema. He was admitted and treated for acute exacerbation of CHF and atrial flutter.   ROS:  + generalized weakness and fatigue, +SOB (improved since admission), + generalized pain,  denies n/v, dizziness, constipation, diarrhea, or depression    PMH:  Past Medical History  Diagnosis Date  .  Diabetes mellitus   . Anemia   . Hypothyroidism   . Hypertension   . Myeloproliferative disorder 06/30/2012     RUE:AVWUJWJ reviewed. No pertinent past surgical history. I have reviewed the FH and SH and  If appropriate  update it with new information. No Known Allergies Scheduled Meds:   . aspirin EC  81 mg Oral QPC breakfast  . carvedilol  25 mg Oral BID WC  . cefTRIAXone (ROCEPHIN)  IV  1 g Intravenous Q24H  . diltiazem  360 mg Oral Daily  . febuxostat  40 mg Oral Daily  . feeding supplement  237 mL Oral TID BM  . furosemide  40 mg Intravenous Daily  . heparin  5,000 Units Subcutaneous Q8H  . levothyroxine  88 mcg Oral Daily  . LORazepam  0.5 mg Intravenous Once  . losartan  50 mg Oral Daily  . multivitamin with minerals  1 tablet Oral Daily  . nicotine  14 mg Transdermal Daily  . potassium chloride  20 mEq Oral BID  . sodium chloride  3 mL Intravenous Q12H   Continuous Infusions:  PRN Meds:.sodium chloride, acetaminophen, ondansetron (ZOFRAN) IV, sodium chloride, traMADol    BP 95/68  Pulse 144  Temp 98.5 F (36.9 C) (Oral)  Resp 16  Ht 5\' 8"  (1.727 m)  Wt 73.7 kg (162 lb 7.7 oz)  BMI 24.70 kg/m2  SpO2 95%   PPS: 60%   Intake/Output Summary (Last 24 hours) at 07/07/12 1411 Last data filed at 07/07/12 0102  Gross per 24 hour  Intake    340 ml  Output    125 ml  Net    215 ml    Physical Exam:  General: Alert, mildly confused, in NAD HEENT:  conjunctivae clear, mouth moist, edentulous Chest:  CTA bilaterally CVS: Irregular Abdomen: soft, non-tender, BS audible Ext: trace pedal and ankle edema bilaterally Neuro: intact, oriented to person/place  Labs: CBC    Component Value Date/Time   WBC 4.3 07/06/2012 0521   WBC 3.9* 06/27/2012 0841   RBC 3.12* 07/06/2012 0521   RBC 2.56* 06/27/2012 0841   HGB 9.1* 07/06/2012 0521   HGB 7.3* 06/27/2012 0841   HCT 29.2* 07/06/2012 0521   HCT 23.6* 06/27/2012 0841   PLT 331 07/06/2012 0521   PLT 345 06/27/2012 0841   MCV 93.6 07/06/2012 0521   MCV 92.2 06/27/2012 0841   MCH 29.2 07/06/2012 0521   MCH 28.5 06/27/2012 0841   MCHC 31.2 07/06/2012 0521   MCHC 30.9* 06/27/2012 0841   RDW 22.9* 07/06/2012 0521   RDW 23.4* 06/27/2012 0841   LYMPHSABS 1.1  06/27/2012 0841   MONOABS 0.2 06/27/2012 0841   EOSABS 0.0 06/27/2012 0841   BASOSABS 0.2* 06/27/2012 0841    BMET    Component Value Date/Time   NA 142 07/06/2012 0521   K 4.4 07/06/2012 0521   CL 106 07/06/2012 0521   CO2 28 07/06/2012 0521   GLUCOSE 116* 07/06/2012 0521   BUN 26* 07/06/2012 0521   CREATININE 1.10 07/06/2012 0521   CALCIUM 8.7 07/06/2012 0521   GFRNONAA 61* 07/06/2012 0521   GFRAA 70* 07/06/2012 0521    CMP     Component Value Date/Time   NA 142 07/06/2012 0521   K 4.4 07/06/2012 0521   CL 106 07/06/2012 0521   CO2 28 07/06/2012 0521   GLUCOSE 116* 07/06/2012 0521   BUN 26* 07/06/2012 0521   CREATININE 1.10 07/06/2012 0521   CALCIUM 8.7 07/06/2012 0521  PROT 6.8 06/29/2012 2215   ALBUMIN 2.9* 06/29/2012 2215   AST 15 06/29/2012 2215   ALT 12 06/29/2012 2215   ALKPHOS 72 06/29/2012 2215   BILITOT 0.4 06/29/2012 2215   GFRNONAA 61* 07/06/2012 0521   GFRAA 70* 07/06/2012 0521    IMPRESSION:  Again noted bilateral small pleural effusion with bilateral  basilar atelectasis or infiltrate right greater than left. Small  amount of fluid noted right minor fissure. Chest Xray Reviewed/Impressions:06/29/12  Time In Time Out Total Time Spent with Patient Total Overall Time  1:30p 2:00p 30 min 30 min    Greater than 50%  of this time was spent counseling and coordinating care related to the above assessment and plan.   Freddie Breech, CNS-C Palliative Medicine Team [facility] Team Phone: [phone] Pager: [phone]

## 2012-07-08 NOTE — Consult Note (Signed)
I have reviewed and discussed the care of this patient in detail with the nurse practitioner including pertinent patient records, physical exam findings and data. I agree with details of this encounter.  

## 2012-07-11 ENCOUNTER — Encounter: Payer: Self-pay | Admitting: Oncology

## 2012-07-11 ENCOUNTER — Other Ambulatory Visit (HOSPITAL_BASED_OUTPATIENT_CLINIC_OR_DEPARTMENT_OTHER): Payer: Medicare Other | Admitting: Lab

## 2012-07-11 ENCOUNTER — Telehealth: Payer: Self-pay | Admitting: *Deleted

## 2012-07-11 ENCOUNTER — Ambulatory Visit (HOSPITAL_BASED_OUTPATIENT_CLINIC_OR_DEPARTMENT_OTHER): Payer: Medicare Other

## 2012-07-11 ENCOUNTER — Ambulatory Visit (HOSPITAL_BASED_OUTPATIENT_CLINIC_OR_DEPARTMENT_OTHER): Payer: Medicare Other | Admitting: Oncology

## 2012-07-11 ENCOUNTER — Other Ambulatory Visit: Payer: Self-pay | Admitting: Oncology

## 2012-07-11 VITALS — BP 108/61 | HR 74 | Temp 97.4°F | Resp 20

## 2012-07-11 VITALS — BP 103/48 | HR 48 | Temp 96.9°F | Resp 18 | Ht 68.0 in | Wt 161.0 lb

## 2012-07-11 DIAGNOSIS — D61818 Other pancytopenia: Secondary | ICD-10-CM

## 2012-07-11 DIAGNOSIS — D649 Anemia, unspecified: Secondary | ICD-10-CM

## 2012-07-11 DIAGNOSIS — D47Z9 Other specified neoplasms of uncertain behavior of lymphoid, hematopoietic and related tissue: Secondary | ICD-10-CM

## 2012-07-11 DIAGNOSIS — D471 Chronic myeloproliferative disease: Secondary | ICD-10-CM

## 2012-07-11 LAB — CBC WITH DIFFERENTIAL/PLATELET
BASO%: 3.2 % — ABNORMAL HIGH (ref 0.0–2.0)
EOS%: 0.4 % (ref 0.0–7.0)
HCT: 25.2 % — ABNORMAL LOW (ref 38.4–49.9)
LYMPH%: 13.3 % — ABNORMAL LOW (ref 14.0–49.0)
MCH: 30.8 pg (ref 27.2–33.4)
MCHC: 33 g/dL (ref 32.0–36.0)
MCV: 93.3 fL (ref 79.3–98.0)
MONO%: 4.8 % (ref 0.0–14.0)
NEUT%: 78.3 % — ABNORMAL HIGH (ref 39.0–75.0)
Platelets: 224 10*3/uL (ref 140–400)
RBC: 2.7 10*6/uL — ABNORMAL LOW (ref 4.20–5.82)
WBC: 4.1 10*3/uL (ref 4.0–10.3)

## 2012-07-11 LAB — TECHNOLOGIST REVIEW

## 2012-07-11 LAB — HOLD TUBE, BLOOD BANK

## 2012-07-11 LAB — PREPARE RBC (CROSSMATCH)

## 2012-07-11 MED ORDER — FUROSEMIDE 10 MG/ML IJ SOLN
40.0000 mg | Freq: Once | INTRAMUSCULAR | Status: AC
  Administered 2012-07-11: 40 mg via INTRAVENOUS

## 2012-07-11 MED ORDER — DARBEPOETIN ALFA-POLYSORBATE 300 MCG/0.6ML IJ SOLN
300.0000 ug | Freq: Once | INTRAMUSCULAR | Status: AC
  Administered 2012-07-11: 300 ug via SUBCUTANEOUS
  Filled 2012-07-11: qty 0.6

## 2012-07-11 MED ORDER — ACETAMINOPHEN 325 MG PO TABS
650.0000 mg | ORAL_TABLET | Freq: Once | ORAL | Status: AC
  Administered 2012-07-11: 650 mg via ORAL

## 2012-07-11 MED ORDER — DIPHENHYDRAMINE HCL 25 MG PO CAPS
25.0000 mg | ORAL_CAPSULE | Freq: Once | ORAL | Status: AC
  Administered 2012-07-11: 25 mg via ORAL

## 2012-07-11 NOTE — Progress Notes (Signed)
Hematology and Oncology Follow Up Visit  Kyle Heath [id number] 22-Jun-1927 78 y.o. 07/11/2012 9:29 AM    Principle Diagnosis: 78 year old gentleman with diagnosis of pancytopenia and more specifically severe anemia likely due to myelofibrosis. He he might have an element of myelodysplastic syndrome as well  Current therapy:  Aranesp therapy at 300 mcg every 2 weeks started on 02/15/12 and supportive transfusions.   Interim History:  Kyle Heath presents today for a followup visit with two of his daughters. He is a pleasant 37 year old gentleman seen for the first time on 02/06/2012. At that time he had presented with a hemoglobin of 6.1 white cell count of 2.0. His peripheral smear indicated an infiltrative bone marrow process. He did have a bone marrow biopsy performed on 02/11/2012. The reviewing pathologists and his bone marrow showed an extensive fibrosis indicating a myeloproliferative disorder more specifically myelofibrosis. He has been getting Aranesp injections and PRBCs every 2 weeks.  Clinically Kyle Heath feels poorly at this time. He was hospitalized about 1 weeks ago due to dyspnea and CHF. Per his daughter, patient may have had a mild CVA. He is now residing at a SNF for rehabilitation. Fatigue is getting worse with a clear decline in his performance status. He has mild dyspnea on exertion at this time. Denies chest pain or shortness of breath. He is reporting diffuse pain and cough that is non-productive. Of note, Palliative Care Team met with patient and family in the hospital and family desires full scope of treatment for the patient. Family has requested an appetite stimulant.   Medications: I have reviewed the patient's current medications. Current outpatient prescriptions:aspirin 81 MG tablet, Take 81 mg by mouth daily after breakfast. , Disp: , Rfl: ;  carvedilol (COREG) 25 MG tablet, Take 1 tablet (25 mg total) by mouth 2 (two) times daily with a meal., Disp: 60 tablet, Rfl: 0;   ciprofloxacin (CIPRO) 500 MG tablet, Take 1 tablet (500 mg total) by mouth 2 (two) times daily., Disp: 7 tablet, Rfl: 0 ciprofloxacin (CIPRO) 500 MG tablet, Take 1 tablet (500 mg total) by mouth 2 (two) times daily., Disp: 7 tablet, Rfl: 1;  diltiazem (CARDIZEM CD) 360 MG 24 hr capsule, Take 1 capsule (360 mg total) by mouth daily., Disp: 30 capsule, Rfl: 2;  febuxostat (ULORIC) 40 MG tablet, Take 40 mg by mouth daily., Disp: , Rfl: ;  furosemide (LASIX) 40 MG tablet, Take 1 tablet (40 mg total) by mouth daily., Disp: 30 tablet, Rfl: 1 hydrocodone-acetaminophen (LORCET-HD) 5-500 MG per capsule, Take 1 capsule by mouth every 8 (eight) hours as needed for pain., Disp: 30 capsule, Rfl: 1;  levothyroxine (SYNTHROID, LEVOTHROID) 88 MCG tablet, Take 88 mcg by mouth daily., Disp: , Rfl: ;  losartan (COZAAR) 100 MG tablet, Take 0.5 tablets (50 mg total) by mouth daily as needed. For swelling, Disp: 30 tablet, Rfl: 2 Prenatal Vit-Fe Fumarate-FA (PRENATAL MULTIVITAMIN) TABS, Take 1 tablet by mouth daily., Disp: , Rfl: ;  traMADol (ULTRAM) 50 MG tablet, Take 1 tablet (50 mg total) by mouth every 6 (six) hours as needed., Disp: 30 tablet, Rfl: 2  Allergies: No Known Allergies  Past Medical History, Surgical history, Social history, and Family History were reviewed and updated.  Review of Systems: Constitutional:  Negative for fever, chills, night sweats,  pain. Cardiovascular: no chest pain or dyspnea on exertion Respiratory: negative Neurological: negative Dermatological: negative ENT: negative Skin: Negative. Gastrointestinal: negative Genito-Urinary: negative Hematological and Lymphatic: negative Breast: negative Musculoskeletal: negative Remaining ROS negative.  Physical Exam: Blood pressure 103/48, pulse 48, temperature 96.9 F (36.1 C), temperature source Oral, resp. rate 18, height 5\' 8"  (1.727 m), weight 161 lb (73.029 kg). ECOG: 2-3 General appearance: alert Head: Normocephalic, without  obvious abnormality, atraumatic Neck: no adenopathy, no carotid bruit, no JVD, supple, symmetrical, trachea midline and thyroid not enlarged, symmetric, no tenderness/mass/nodules Lymph nodes: Cervical, supraclavicular, and axillary nodes normal. Heart:regular rate and rhythm, S1, S2 normal, no murmur, click, rub or gallop Lung:chest clear, no wheezing, rales, normal symmetric air entry Abdomen: soft, non-tender, without masses or organomegaly EXT:no erythema, induration, or nodules. 1+ edema.   Lab Results:  CBC    Component Value Date/Time   WBC 4.1 07/11/2012 0841   WBC 4.3 07/06/2012 0521   RBC 2.70* 07/11/2012 0841   RBC 3.12* 07/06/2012 0521   HGB 8.3* 07/11/2012 0841   HGB 9.1* 07/06/2012 0521   HCT 25.2* 07/11/2012 0841   HCT 29.2* 07/06/2012 0521   PLT 224 07/11/2012 0841   PLT 331 07/06/2012 0521   MCV 93.3 07/11/2012 0841   MCV 93.6 07/06/2012 0521   MCH 30.8 07/11/2012 0841   MCH 29.2 07/06/2012 0521   MCHC 33.0 07/11/2012 0841   MCHC 31.2 07/06/2012 0521   RDW 20.2* 07/11/2012 0841   RDW 22.9* 07/06/2012 0521   LYMPHSABS 0.5* 07/11/2012 0841   MONOABS 0.2 07/11/2012 0841   EOSABS 0.0 07/11/2012 0841   BASOSABS 0.1 07/11/2012 0841    Impression and Plan:  78 year old gentleman with the following issues:  1. 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 He could also have an element of myelodysplastic syndrome as well. He is currently on supportive care only. He is asymptomatic from it. He does not have abdominal pain or a large spleen.  Plan to check CBC every 2 weeks and administer Aranesp if Hemoglobin is less than 11.0 and transfuse when symptomatic from his anemia. He will receive 2 units PRBCs today.   Overall, his prognosis is very poor. I have explained to the patient and his daughters that his prognosis is poor and he is not a candidate for any aggressive treatment. The daughter who is visit from out of town has requested a second opinion at  [facility]. I will arrange this for them per their request.  2. Neutropenia: Resolved.  3. Thrombocytopenia : Resolved  4. HTN: On Norvasc and he will follow-up with PCP for HTN.  5. DM: On Jentadueto. He will follow-up with PCP.  6. Followup: Every 2 weeks for lab, injection, and possible transfusion and he will be seen for a visit in 6 weeks.     Clenton Pare 8/23/20139:29 AM

## 2012-07-11 NOTE — Telephone Encounter (Signed)
Request made to Tiffany in medical records to send all appropriate chart documents and bone marrow slides done earlier this year to Dr. Vita Erm. Arcasoy's office at [facility].  Phone number to that office is [phone].  Fax is [phone].  This is at the request of Clenton Pare, DNP.  Called MD office at [facility] to make aware of referral.

## 2012-07-12 LAB — TYPE AND SCREEN
ABO/RH(D): O POS
Antibody Screen: NEGATIVE
Unit division: 0
Unit division: 0

## 2012-07-20 ENCOUNTER — Encounter (HOSPITAL_COMMUNITY)
Admission: RE | Admit: 2012-07-20 | Discharge: 2012-07-20 | Disposition: A | Discharge status: Home / Self Care | Payer: Medicare Other | Source: Ambulatory Visit | Attending: Oncology | Admitting: Oncology

## 2012-07-20 DIAGNOSIS — D649 Anemia, unspecified: Secondary | ICD-10-CM | POA: Insufficient documentation

## 2012-07-22 ENCOUNTER — Telehealth: Payer: Self-pay | Admitting: Oncology

## 2012-07-22 NOTE — Telephone Encounter (Signed)
Pt has appt. With Dr. Maren Reamer @ [facility] on 08/11/12@1 :00 . Medical records faxed. Pt is aware.

## 2012-07-25 ENCOUNTER — Other Ambulatory Visit (HOSPITAL_BASED_OUTPATIENT_CLINIC_OR_DEPARTMENT_OTHER): Payer: Medicare Other | Admitting: Lab

## 2012-07-25 ENCOUNTER — Ambulatory Visit: Payer: Medicare Other

## 2012-07-25 DIAGNOSIS — D649 Anemia, unspecified: Secondary | ICD-10-CM

## 2012-07-25 LAB — CBC WITH DIFFERENTIAL/PLATELET
BASO%: 1.5 % (ref 0.0–2.0)
HCT: 27.6 % — ABNORMAL LOW (ref 38.4–49.9)
MCHC: 31.8 g/dL — ABNORMAL LOW (ref 32.0–36.0)
MONO#: 0.2 10*3/uL (ref 0.1–0.9)
NEUT%: 75.2 % — ABNORMAL HIGH (ref 39.0–75.0)
RBC: 2.9 10*6/uL — ABNORMAL LOW (ref 4.20–5.82)
WBC: 4.8 10*3/uL (ref 4.0–10.3)
lymph#: 0.9 10*3/uL (ref 0.9–3.3)

## 2012-07-25 LAB — HOLD TUBE, BLOOD BANK

## 2012-07-25 LAB — TECHNOLOGIST REVIEW

## 2012-07-25 NOTE — Progress Notes (Signed)
Spoke with pt, he is feeling well today.  Per Dr. Clelia Croft, no need to transfuse with Hgb 8.8.  Pt aware, copy of labs given to pt/daughter-dhp, rn

## 2012-07-28 ENCOUNTER — Other Ambulatory Visit: Payer: Self-pay | Admitting: *Deleted

## 2012-08-08 ENCOUNTER — Other Ambulatory Visit: Payer: Self-pay | Admitting: Medical Oncology

## 2012-08-08 ENCOUNTER — Ambulatory Visit (HOSPITAL_BASED_OUTPATIENT_CLINIC_OR_DEPARTMENT_OTHER): Payer: Medicare Other | Admitting: Oncology

## 2012-08-08 ENCOUNTER — Other Ambulatory Visit: Payer: Medicare Other | Admitting: Lab

## 2012-08-08 ENCOUNTER — Telehealth: Payer: Self-pay | Admitting: Oncology

## 2012-08-08 ENCOUNTER — Ambulatory Visit (HOSPITAL_BASED_OUTPATIENT_CLINIC_OR_DEPARTMENT_OTHER): Payer: Medicare Other

## 2012-08-08 ENCOUNTER — Other Ambulatory Visit: Payer: Self-pay | Admitting: *Deleted

## 2012-08-08 ENCOUNTER — Encounter: Payer: Self-pay | Admitting: Oncology

## 2012-08-08 ENCOUNTER — Other Ambulatory Visit (HOSPITAL_BASED_OUTPATIENT_CLINIC_OR_DEPARTMENT_OTHER): Payer: Medicare Other | Admitting: Lab

## 2012-08-08 VITALS — BP 94/55 | HR 66 | Temp 97.7°F | Resp 20

## 2012-08-08 VITALS — BP 110/61 | HR 142 | Temp 98.2°F | Resp 20 | Ht 68.0 in | Wt 177.0 lb

## 2012-08-08 DIAGNOSIS — D471 Chronic myeloproliferative disease: Secondary | ICD-10-CM

## 2012-08-08 DIAGNOSIS — D47Z9 Other specified neoplasms of uncertain behavior of lymphoid, hematopoietic and related tissue: Secondary | ICD-10-CM

## 2012-08-08 DIAGNOSIS — D649 Anemia, unspecified: Secondary | ICD-10-CM

## 2012-08-08 DIAGNOSIS — I10 Essential (primary) hypertension: Secondary | ICD-10-CM

## 2012-08-08 LAB — CBC WITH DIFFERENTIAL/PLATELET
BASO%: 1.6 % (ref 0.0–2.0)
EOS%: 0.9 % (ref 0.0–7.0)
HGB: 7.2 g/dL — ABNORMAL LOW (ref 13.0–17.1)
MCH: 30.7 pg (ref 27.2–33.4)
MCHC: 32.1 g/dL (ref 32.0–36.0)
RDW: 26.4 % — ABNORMAL HIGH (ref 11.0–14.6)
lymph#: 0.9 10*3/uL (ref 0.9–3.3)

## 2012-08-08 LAB — PREPARE RBC (CROSSMATCH)

## 2012-08-08 MED ORDER — DIPHENHYDRAMINE HCL 25 MG PO CAPS
25.0000 mg | ORAL_CAPSULE | Freq: Once | ORAL | Status: AC
  Administered 2012-08-08: 25 mg via ORAL

## 2012-08-08 MED ORDER — FUROSEMIDE 10 MG/ML IJ SOLN
20.0000 mg | Freq: Once | INTRAMUSCULAR | Status: AC
  Administered 2012-08-08: 20 mg via INTRAVENOUS

## 2012-08-08 MED ORDER — SODIUM CHLORIDE 0.9 % IV SOLN
250.0000 mL | Freq: Once | INTRAVENOUS | Status: AC
  Administered 2012-08-08: 250 mL via INTRAVENOUS

## 2012-08-08 MED ORDER — ACETAMINOPHEN 325 MG PO TABS
650.0000 mg | ORAL_TABLET | Freq: Once | ORAL | Status: AC
  Administered 2012-08-08: 650 mg via ORAL

## 2012-08-08 NOTE — Patient Instructions (Signed)
Patient discharged to rehab facility, with daughter; tolerated transfusion well.

## 2012-08-08 NOTE — Telephone Encounter (Signed)
Gave pt appt for October 2013 to November 2013, lab, MD, PRBC and injections

## 2012-08-08 NOTE — Progress Notes (Signed)
Hematology and Oncology Follow Up Visit  Kyle Heath [id number] May 08, 1927 78 y.o. 08/08/2012 10:45 AM    Principle Diagnosis: 78 year old gentleman with diagnosis of pancytopenia and more specifically severe anemia likely due to myelofibrosis. He he might have an element of myelodysplastic syndrome as well  Current therapy:  Aranesp therapy at 300 mcg every 2 weeks started on 02/15/12 and supportive transfusions.   Interim History:  Kyle Heath presents today for a followup visit with two of his daughters. He is a pleasant 44 year old gentleman seen for the first time on 02/06/2012. At that time he had presented with a hemoglobin of 6.1 white cell count of 2.0. His peripheral smear indicated an infiltrative bone marrow process. He did have a bone marrow biopsy performed on 02/11/2012. The reviewing pathologists and his bone marrow showed an extensive fibrosis indicating a myeloproliferative disorder more specifically myelofibrosis. He has been getting Aranesp injections and PRBCs every 2 weeks. Kyle Heath continues to reside at a SNF. Daughter reports that he has his days and nights mixed up. Thinks he is getting stronger. He can ambulate with walker short distances. Able to toilet himself and dress himself. PT has discharged patient has he is at maximum potential. Fatigue is stable. Denies chest pain, shortness of breath, and dyspnea. Remains on Lasix for CHF/fluid overload. Daughter reports legs are "tight" at times. Appetite is better with an increase in weight. Denies pain.  Medications: I have reviewed the patient's current medications. Current outpatient prescriptions:aspirin 81 MG tablet, Take 81 mg by mouth daily after breakfast. , Disp: , Rfl: ;  carvedilol (COREG) 25 MG tablet, Take 1 tablet (25 mg total) by mouth 2 (two) times daily with a meal., Disp: 60 tablet, Rfl: 0;  diltiazem (CARDIZEM CD) 360 MG 24 hr capsule, Take 1 capsule (360 mg total) by mouth daily., Disp: 30 capsule, Rfl: 2;   febuxostat (ULORIC) 40 MG tablet, Take 40 mg by mouth daily., Disp: , Rfl:  furosemide (LASIX) 40 MG tablet, Take 1 tablet (40 mg total) by mouth daily., Disp: 30 tablet, Rfl: 1;  HYDROcodone-acetaminophen (VICODIN) 5-500 MG per tablet, Take 1 tablet by mouth Every 6 hours as needed., Disp: , Rfl: ;  levothyroxine (SYNTHROID, LEVOTHROID) 88 MCG tablet, Take 88 mcg by mouth daily., Disp: , Rfl: ;  losartan (COZAAR) 100 MG tablet, Take 0.5 tablets (50 mg total) by mouth daily as needed. For swelling, Disp: 30 tablet, Rfl: 2 Multiple Vitamin (MULTIVITAMIN) capsule, Take 1 capsule by mouth daily., Disp: , Rfl: ;  traMADol (ULTRAM) 50 MG tablet, Take 1 tablet by mouth every 6 (six) hours as needed., Disp: , Rfl:  No current facility-administered medications for this visit. Facility-Administered Medications Ordered in Other Visits: 0.9 %  sodium chloride infusion, 250 mL, Intravenous, Once, Myrtis Ser, NP;  acetaminophen (TYLENOL) tablet 650 mg, 650 mg, Oral, Once, Myrtis Ser, NP;  diphenhydrAMINE (BENADRYL) capsule 25 mg, 25 mg, Oral, Once, Myrtis Ser, NP;  furosemide (LASIX) injection 20 mg, 20 mg, Intravenous, Once, Myrtis Ser, NP  Allergies: No Known Allergies  Past Medical History, Surgical history, Social history, and Family History were reviewed and updated.  Review of Systems: Constitutional:  Negative for fever, chills, night sweats,  pain. Cardiovascular: no chest pain or dyspnea on exertion Respiratory: negative Neurological: negative Dermatological: negative ENT: negative Skin: Negative. Gastrointestinal: negative Genito-Urinary: negative Hematological and Lymphatic: negative Breast: negative Musculoskeletal: negative Remaining ROS negative.  Physical Exam: Blood pressure 110/61, pulse 142, temperature 98.2 F (36.8  C), resp. rate 20, height 5\' 8"  (1.727 m), weight 177 lb (80.287 kg). ECOG: 2-3 General appearance: alert. Pleasantly confused. Head:  Normocephalic, without obvious abnormality, atraumatic Neck: no adenopathy, no carotid bruit, no JVD, supple, symmetrical, trachea midline and thyroid not enlarged, symmetric, no tenderness/mass/nodules Lymph nodes: Cervical, supraclavicular, and axillary nodes normal. Heart:regular rate and rhythm, S1, S2 normal, no murmur, click, rub or gallop Lung:chest clear, no wheezing, rales, normal symmetric air entry Abdomen: soft, non-tender, without masses or organomegaly EXT:no erythema, induration, or nodules. 1+ edema bilaterally..   Lab Results:  CBC    Component Value Date/Time   WBC 4.5 08/08/2012 0840   WBC 4.3 07/06/2012 0521   RBC 2.35* 08/08/2012 0840   RBC 3.12* 07/06/2012 0521   HGB 7.2* 08/08/2012 0840   HGB 9.1* 07/06/2012 0521   HCT 22.5* 08/08/2012 0840   HCT 29.2* 07/06/2012 0521   PLT 414* 08/08/2012 0840   PLT 331 07/06/2012 0521   MCV 95.6 08/08/2012 0840   MCV 93.6 07/06/2012 0521   MCH 30.7 08/08/2012 0840   MCH 29.2 07/06/2012 0521   MCHC 32.1 08/08/2012 0840   MCHC 31.2 07/06/2012 0521   RDW 26.4* 08/08/2012 0840   RDW 22.9* 07/06/2012 0521   LYMPHSABS 0.9 08/08/2012 0840   MONOABS 0.1 08/08/2012 0840   EOSABS 0.0 08/08/2012 0840   BASOSABS 0.1 08/08/2012 0840    Impression and Plan:  78 year old gentleman with the following issues:  1. 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 He could also have an element of myelodysplastic syndrome as well. He is currently on supportive care only.He does not have abdominal pain or a large spleen. Plan to check CBC every 2 weeks and administer Aranesp if Hemoglobin is less than 11.0 and transfuse when symptomatic from his anemia. He will receive 2 units PRBCs today and Aranesp injection today. His overall prognosis is very poor. I have explained to the patient and his daughters that his prognosis is poor and he is not a candidate for any aggressive treatment. Family plans to bring patient to [facility] for a  second opinion which is scheduled for 07/11/12 to determine if there are any other recommendations.  2. Neutropenia: Resolved.  3. Thrombocytopenia : Resolved. Platelets are slightly elevated today. Will monitor.   4. HTN: On Cardizem and Cozaar per PCP.  5. DM: Diet-controlled. He is followed by PCP.  6. Followup: Every 2 weeks for lab, injection, and possible transfusion and he will be seen for a visit in 6 weeks.     [place], Kyle Heath 9/20/201310:45 AM

## 2012-08-09 LAB — TYPE AND SCREEN
ABO/RH(D): O POS
Unit division: 0

## 2012-08-19 ENCOUNTER — Encounter (HOSPITAL_COMMUNITY)
Admission: RE | Admit: 2012-08-19 | Discharge: 2012-08-19 | Disposition: A | Discharge status: Home / Self Care | Payer: Medicare Other | Source: Ambulatory Visit | Attending: Oncology | Admitting: Oncology

## 2012-08-19 DIAGNOSIS — D649 Anemia, unspecified: Secondary | ICD-10-CM | POA: Insufficient documentation

## 2012-08-22 ENCOUNTER — Ambulatory Visit (HOSPITAL_BASED_OUTPATIENT_CLINIC_OR_DEPARTMENT_OTHER): Payer: Medicare Other

## 2012-08-22 ENCOUNTER — Ambulatory Visit: Payer: Medicare Other

## 2012-08-22 ENCOUNTER — Other Ambulatory Visit (HOSPITAL_BASED_OUTPATIENT_CLINIC_OR_DEPARTMENT_OTHER): Payer: Medicare Other | Admitting: Lab

## 2012-08-22 VITALS — BP 141/59 | HR 102 | Temp 98.0°F | Resp 20

## 2012-08-22 DIAGNOSIS — D649 Anemia, unspecified: Secondary | ICD-10-CM

## 2012-08-22 LAB — CBC WITH DIFFERENTIAL/PLATELET
Basophils Absolute: 0.1 10*3/uL (ref 0.0–0.1)
EOS%: 0.8 % (ref 0.0–7.0)
Eosinophils Absolute: 0 10*3/uL (ref 0.0–0.5)
HCT: 24.6 % — ABNORMAL LOW (ref 38.4–49.9)
HGB: 8 g/dL — ABNORMAL LOW (ref 13.0–17.1)
MCH: 31.3 pg (ref 27.2–33.4)
MCV: 95.7 fL (ref 79.3–98.0)
NEUT%: 71.9 % (ref 39.0–75.0)
lymph#: 1.1 10*3/uL (ref 0.9–3.3)

## 2012-08-22 MED ORDER — DARBEPOETIN ALFA-POLYSORBATE 300 MCG/0.6ML IJ SOLN
300.0000 ug | Freq: Once | INTRAMUSCULAR | Status: AC
  Administered 2012-08-22: 300 ug via SUBCUTANEOUS
  Filled 2012-08-22: qty 0.6

## 2012-08-22 NOTE — Patient Instructions (Addendum)
Darbepoetin Alfa injection What is this medicine? DARBEPOETIN ALFA (dar be POE e tin AL fa) helps your body make more red blood cells. It is used to treat anemia caused by chronic kidney failure and chemotherapy. This medicine may be used for other purposes; ask your health care provider or pharmacist if you have questions. What should I tell my health care provider before I take this medicine? They need to know if you have any of these conditions: -blood clotting disorders or history of blood clots -cancer patient not on chemotherapy -cystic fibrosis -heart disease, such as angina, heart failure, or a history of a heart attack -hemoglobin level of 12 g/dL or greater -high blood pressure -low levels of folate, iron, or vitamin B12 -seizures -an unusual or allergic reaction to darbepoetin, erythropoietin, albumin, hamster proteins, latex, other medicines, foods, dyes, or preservatives -pregnant or trying to get pregnant -breast-feeding How should I use this medicine? This medicine is for injection into a vein or under the skin. It is usually given by a health care professional in a hospital or clinic setting. If you get this medicine at home, you will be taught how to prepare and give this medicine. Do not shake the solution before you withdraw a dose. Use exactly as directed. Take your medicine at regular intervals. Do not take your medicine more often than directed. It is important that you put your used needles and syringes in a special sharps container. Do not put them in a trash can. If you do not have a sharps container, call your pharmacist or healthcare provider to get one. Talk to your pediatrician regarding the use of this medicine in children. While this medicine may be used in children as young as 1 year for selected conditions, precautions do apply. Overdosage: If you think you have taken too much of this medicine contact a poison control center or emergency room at once. NOTE:  This medicine is only for you. Do not share this medicine with others. What if I miss a dose? If you miss a dose, take it as soon as you can. If it is almost time for your next dose, take only that dose. Do not take double or extra doses. What may interact with this medicine? Do not take this medicine with any of the following medications: -epoetin alfa This list may not describe all possible interactions. Give your health care provider a list of all the medicines, herbs, non-prescription drugs, or dietary supplements you use. Also tell them if you smoke, drink alcohol, or use illegal drugs. Some items may interact with your medicine. What should I watch for while using this medicine? Visit your prescriber or health care professional for regular checks on your progress and for the needed blood tests and blood pressure measurements. It is especially important for the doctor to make sure your hemoglobin level is in the desired range, to limit the risk of potential side effects and to give you the best benefit. Keep all appointments for any recommended tests. Check your blood pressure as directed. Ask your doctor what your blood pressure should be and when you should contact him or her. As your body makes more red blood cells, you may need to take iron, folic acid, or vitamin B supplements. Ask your doctor or health care provider which products are right for you. If you have kidney disease continue dietary restrictions, even though this medication can make you feel better. Talk with your doctor or health care professional about the   foods you eat and the vitamins that you take. What side effects may I notice from receiving this medicine? Side effects that you should report to your doctor or health care professional as soon as possible: -allergic reactions like skin rash, itching or hives, swelling of the face, lips, or tongue -breathing problems -changes in vision -chest pain -confusion, trouble speaking  or understanding -feeling faint or lightheaded, falls -high blood pressure -muscle aches or pains -pain, swelling, warmth in the leg -rapid weight gain -severe headaches -sudden numbness or weakness of the face, arm or leg -trouble walking, dizziness, loss of balance or coordination -seizures (convulsions) -swelling of the ankles, feet, hands -unusually weak or tired Side effects that usually do not require medical attention (report to your doctor or health care professional if they continue or are bothersome): -diarrhea -fever, chills (flu-like symptoms) -headaches -nausea, vomiting -redness, stinging, or swelling at site where injected This list may not describe all possible side effects. Call your doctor for medical advice about side effects. You may report side effects to FDA at 1-800-FDA-1088. Where should I keep my medicine? Keep out of the reach of children. Store in a refrigerator between 2 and 8 degrees C (36 and 46 degrees F). Do not freeze. Do not shake. Throw away any unused portion if using a single-dose vial. Throw away any unused medicine after the expiration date. NOTE: This sheet is a summary. It may not cover all possible information. If you have questions about this medicine, talk to your doctor, pharmacist, or health care provider.  2012, [employer]. (10/19/2008 10:23:57 AM) 

## 2012-08-22 NOTE — Progress Notes (Signed)
0900-No need for blood transfusion today per K. Curcio NP.  Pt states that he is having no difficulties or SOB at this time.  Aranesp given as ordered.

## 2012-09-05 ENCOUNTER — Other Ambulatory Visit (HOSPITAL_BASED_OUTPATIENT_CLINIC_OR_DEPARTMENT_OTHER): Payer: Medicare Other | Admitting: Lab

## 2012-09-05 ENCOUNTER — Other Ambulatory Visit: Payer: Self-pay | Admitting: Oncology

## 2012-09-05 ENCOUNTER — Ambulatory Visit (HOSPITAL_BASED_OUTPATIENT_CLINIC_OR_DEPARTMENT_OTHER): Payer: Medicare Other

## 2012-09-05 VITALS — BP 118/70 | HR 59 | Temp 98.2°F | Resp 16

## 2012-09-05 DIAGNOSIS — D649 Anemia, unspecified: Secondary | ICD-10-CM

## 2012-09-05 LAB — CBC WITH DIFFERENTIAL/PLATELET
Basophils Absolute: 0.2 10*3/uL — ABNORMAL HIGH (ref 0.0–0.1)
Eosinophils Absolute: 0 10*3/uL (ref 0.0–0.5)
HCT: 24.5 % — ABNORMAL LOW (ref 38.4–49.9)
HGB: 7.4 g/dL — ABNORMAL LOW (ref 13.0–17.1)
LYMPH%: 31.4 % (ref 14.0–49.0)
MCHC: 30.2 g/dL — ABNORMAL LOW (ref 32.0–36.0)
MONO#: 0.1 10*3/uL (ref 0.1–0.9)
NEUT%: 60.8 % (ref 39.0–75.0)
Platelets: 459 10*3/uL — ABNORMAL HIGH (ref 140–400)
WBC: 4 10*3/uL (ref 4.0–10.3)

## 2012-09-05 LAB — PREPARE RBC (CROSSMATCH)

## 2012-09-05 MED ORDER — DARBEPOETIN ALFA-POLYSORBATE 300 MCG/0.6ML IJ SOLN
300.0000 ug | Freq: Once | INTRAMUSCULAR | Status: AC
  Administered 2012-09-05: 300 ug via SUBCUTANEOUS
  Filled 2012-09-05: qty 0.6

## 2012-09-05 MED ORDER — FUROSEMIDE 10 MG/ML IJ SOLN
20.0000 mg | Freq: Once | INTRAMUSCULAR | Status: AC
  Administered 2012-09-05: 20 mg via INTRAVENOUS

## 2012-09-05 MED ORDER — DIPHENHYDRAMINE HCL 25 MG PO CAPS
25.0000 mg | ORAL_CAPSULE | Freq: Once | ORAL | Status: AC
  Administered 2012-09-05: 25 mg via ORAL

## 2012-09-05 MED ORDER — ACETAMINOPHEN 325 MG PO TABS
650.0000 mg | ORAL_TABLET | Freq: Once | ORAL | Status: AC
  Administered 2012-09-05: 650 mg via ORAL

## 2012-09-05 MED ORDER — SODIUM CHLORIDE 0.9 % IV SOLN
250.0000 mL | Freq: Once | INTRAVENOUS | Status: AC
  Administered 2012-09-05: 250 mL via INTRAVENOUS

## 2012-09-05 NOTE — Patient Instructions (Addendum)
Blood Transfusion Information WHAT IS A BLOOD TRANSFUSION? A transfusion is the replacement of blood or some of its parts. Blood is made up of multiple cells which provide different functions.  Red blood cells carry oxygen and are used for blood loss replacement.  White blood cells fight against infection.  Platelets control bleeding.  Plasma helps clot blood.  Other blood products are available for specialized needs, such as hemophilia or other clotting disorders. BEFORE THE TRANSFUSION  Who gives blood for transfusions?   You may be able to donate blood to be used at a later date on yourself (autologous donation).  Relatives can be asked to donate blood. This is generally not any safer than if you have received blood from a stranger. The same precautions are taken to ensure safety when a relative's blood is donated.  Healthy volunteers who are fully evaluated to make sure their blood is safe. This is blood bank blood. Transfusion therapy is the safest it has ever been in the practice of medicine. Before blood is taken from a donor, a complete history is taken to make sure that person has no history of diseases nor engages in risky social behavior (examples are intravenous drug use or sexual activity with multiple partners). The donor's travel history is screened to minimize risk of transmitting infections, such as malaria. The donated blood is tested for signs of infectious diseases, such as HIV and hepatitis. The blood is then tested to be sure it is compatible with you in order to minimize the chance of a transfusion reaction. If you or a relative donates blood, this is often done in anticipation of surgery and is not appropriate for emergency situations. It takes many days to process the donated blood. RISKS AND COMPLICATIONS Although transfusion therapy is very safe and saves many lives, the main dangers of transfusion include:   Getting an infectious disease.  Developing a  transfusion reaction. This is an allergic reaction to something in the blood you were given. Every precaution is taken to prevent this. The decision to have a blood transfusion has been considered carefully by your caregiver before blood is given. Blood is not given unless the benefits outweigh the risks. AFTER THE TRANSFUSION  Right after receiving a blood transfusion, you will usually feel much better and more energetic. This is especially true if your red blood cells have gotten low (anemic). The transfusion raises the level of the red blood cells which carry oxygen, and this usually causes an energy increase.  The nurse administering the transfusion will monitor you carefully for complications. HOME CARE INSTRUCTIONS  No special instructions are needed after a transfusion. You may find your energy is better. Speak with your caregiver about any limitations on activity for underlying diseases you may have. SEEK MEDICAL CARE IF:   Your condition is not improving after your transfusion.  You develop redness or irritation at the intravenous (IV) site. SEEK IMMEDIATE MEDICAL CARE IF:  Any of the following symptoms occur over the next 12 hours:  Shaking chills.  You have a temperature by mouth above 102 F (38.9 C), not controlled by medicine.  Chest, back, or muscle pain.  People around you feel you are not acting correctly or are confused.  Shortness of breath or difficulty breathing.  Dizziness and fainting.  You get a rash or develop hives.  You have a decrease in urine output.  Your urine turns a dark color or changes to pink, red, or brown. Any of the following   symptoms occur over the next 10 days:  You have a temperature by mouth above 102 F (38.9 C), not controlled by medicine.  Shortness of breath.  Weakness after normal activity.  The white part of the eye turns yellow (jaundice).  You have a decrease in the amount of urine or are urinating less often.  Your  urine turns a dark color or changes to pink, red, or brown. Document Released: 11/02/2000 Document Revised: 01/28/2012 Document Reviewed: 06/21/2008 ExitCare Patient Information 2013 [employer].  

## 2012-09-06 LAB — TYPE AND SCREEN
ABO/RH(D): O POS
Antibody Screen: NEGATIVE
Unit division: 0

## 2012-09-10 ENCOUNTER — Other Ambulatory Visit: Payer: Self-pay | Admitting: Oncology

## 2012-09-10 DIAGNOSIS — D649 Anemia, unspecified: Secondary | ICD-10-CM

## 2012-09-19 ENCOUNTER — Other Ambulatory Visit: Payer: Medicare Other | Admitting: Lab

## 2012-09-19 ENCOUNTER — Ambulatory Visit (HOSPITAL_BASED_OUTPATIENT_CLINIC_OR_DEPARTMENT_OTHER): Payer: Medicare Other | Admitting: Oncology

## 2012-09-19 ENCOUNTER — Telehealth: Payer: Self-pay | Admitting: Oncology

## 2012-09-19 ENCOUNTER — Encounter (HOSPITAL_COMMUNITY)
Admission: RE | Admit: 2012-09-19 | Discharge: 2012-09-19 | Disposition: A | Discharge status: Home / Self Care | Payer: Medicare Other | Source: Ambulatory Visit | Attending: Oncology | Admitting: Oncology

## 2012-09-19 ENCOUNTER — Ambulatory Visit (HOSPITAL_BASED_OUTPATIENT_CLINIC_OR_DEPARTMENT_OTHER): Payer: Medicare Other

## 2012-09-19 ENCOUNTER — Other Ambulatory Visit (HOSPITAL_BASED_OUTPATIENT_CLINIC_OR_DEPARTMENT_OTHER): Payer: Medicare Other | Admitting: Lab

## 2012-09-19 ENCOUNTER — Telehealth: Payer: Self-pay | Admitting: *Deleted

## 2012-09-19 VITALS — BP 140/52 | HR 88 | Temp 98.5°F | Resp 20 | Ht 68.0 in | Wt 188.9 lb

## 2012-09-19 VITALS — BP 137/64 | HR 93 | Temp 98.8°F | Resp 24

## 2012-09-19 DIAGNOSIS — D696 Thrombocytopenia, unspecified: Secondary | ICD-10-CM

## 2012-09-19 DIAGNOSIS — D649 Anemia, unspecified: Secondary | ICD-10-CM

## 2012-09-19 DIAGNOSIS — D709 Neutropenia, unspecified: Secondary | ICD-10-CM

## 2012-09-19 DIAGNOSIS — D47Z9 Other specified neoplasms of uncertain behavior of lymphoid, hematopoietic and related tissue: Secondary | ICD-10-CM

## 2012-09-19 DIAGNOSIS — D471 Chronic myeloproliferative disease: Secondary | ICD-10-CM

## 2012-09-19 LAB — PREPARE RBC (CROSSMATCH)

## 2012-09-19 LAB — CBC WITH DIFFERENTIAL/PLATELET
BASO%: 1.1 % (ref 0.0–2.0)
Eosinophils Absolute: 0 10*3/uL (ref 0.0–0.5)
MCHC: 30.8 g/dL — ABNORMAL LOW (ref 32.0–36.0)
MONO#: 0.2 10*3/uL (ref 0.1–0.9)
NEUT#: 2.2 10*3/uL (ref 1.5–6.5)
Platelets: 191 10*3/uL (ref 140–400)
RBC: 2.91 10*6/uL — ABNORMAL LOW (ref 4.20–5.82)
WBC: 3.5 10*3/uL — ABNORMAL LOW (ref 4.0–10.3)
lymph#: 1.1 10*3/uL (ref 0.9–3.3)
nRBC: 1 % — ABNORMAL HIGH (ref 0–0)

## 2012-09-19 LAB — TECHNOLOGIST REVIEW

## 2012-09-19 MED ORDER — SODIUM CHLORIDE 0.9 % IV SOLN
250.0000 mL | Freq: Once | INTRAVENOUS | Status: AC
  Administered 2012-09-19: 250 mL via INTRAVENOUS

## 2012-09-19 MED ORDER — ACETAMINOPHEN 325 MG PO TABS
650.0000 mg | ORAL_TABLET | Freq: Once | ORAL | Status: AC
  Administered 2012-09-19: 650 mg via ORAL

## 2012-09-19 MED ORDER — DIPHENHYDRAMINE HCL 25 MG PO CAPS
25.0000 mg | ORAL_CAPSULE | Freq: Once | ORAL | Status: AC
  Administered 2012-09-19: 25 mg via ORAL

## 2012-09-19 NOTE — Patient Instructions (Signed)
Blood Transfusion Information WHAT IS A BLOOD TRANSFUSION? A transfusion is the replacement of blood or some of its parts. Blood is made up of multiple cells which provide different functions.  Red blood cells carry oxygen and are used for blood loss replacement.  White blood cells fight against infection.  Platelets control bleeding.  Plasma helps clot blood.  Other blood products are available for specialized needs, such as hemophilia or other clotting disorders. BEFORE THE TRANSFUSION  Who gives blood for transfusions?   You may be able to donate blood to be used at a later date on yourself (autologous donation).  Relatives can be asked to donate blood. This is generally not any safer than if you have received blood from a stranger. The same precautions are taken to ensure safety when a relative's blood is donated.  Healthy volunteers who are fully evaluated to make sure their blood is safe. This is blood bank blood. Transfusion therapy is the safest it has ever been in the practice of medicine. Before blood is taken from a donor, a complete history is taken to make sure that person has no history of diseases nor engages in risky social behavior (examples are intravenous drug use or sexual activity with multiple partners). The donor's travel history is screened to minimize risk of transmitting infections, such as malaria. The donated blood is tested for signs of infectious diseases, such as HIV and hepatitis. The blood is then tested to be sure it is compatible with you in order to minimize the chance of a transfusion reaction. If you or a relative donates blood, this is often done in anticipation of surgery and is not appropriate for emergency situations. It takes many days to process the donated blood. RISKS AND COMPLICATIONS Although transfusion therapy is very safe and saves many lives, the main dangers of transfusion include:   Getting an infectious disease.  Developing a  transfusion reaction. This is an allergic reaction to something in the blood you were given. Every precaution is taken to prevent this. The decision to have a blood transfusion has been considered carefully by your caregiver before blood is given. Blood is not given unless the benefits outweigh the risks. AFTER THE TRANSFUSION  Right after receiving a blood transfusion, you will usually feel much better and more energetic. This is especially true if your red blood cells have gotten low (anemic). The transfusion raises the level of the red blood cells which carry oxygen, and this usually causes an energy increase.  The nurse administering the transfusion will monitor you carefully for complications. HOME CARE INSTRUCTIONS  No special instructions are needed after a transfusion. You may find your energy is better. Speak with your caregiver about any limitations on activity for underlying diseases you may have. SEEK MEDICAL CARE IF:   Your condition is not improving after your transfusion.  You develop redness or irritation at the intravenous (IV) site. SEEK IMMEDIATE MEDICAL CARE IF:  Any of the following symptoms occur over the next 12 hours:  Shaking chills.  You have a temperature by mouth above 102 F (38.9 C), not controlled by medicine.  Chest, back, or muscle pain.  People around you feel you are not acting correctly or are confused.  Shortness of breath or difficulty breathing.  Dizziness and fainting.  You get a rash or develop hives.  You have a decrease in urine output.  Your urine turns a dark color or changes to pink, red, or brown. Any of the following   symptoms occur over the next 10 days:  You have a temperature by mouth above 102 F (38.9 C), not controlled by medicine.  Shortness of breath.  Weakness after normal activity.  The white part of the eye turns yellow (jaundice).  You have a decrease in the amount of urine or are urinating less often.  Your  urine turns a dark color or changes to pink, red, or brown. Document Released: 11/02/2000 Document Revised: 01/28/2012 Document Reviewed: 06/21/2008 Exit[employer] Patient Information 2013 [employer].  

## 2012-09-19 NOTE — Telephone Encounter (Signed)
Per staff phone call and POF I have scheduled appts. [name]  

## 2012-09-19 NOTE — Progress Notes (Signed)
Hematology and Oncology Follow Up Visit  Kyle Heath [id number] [birth year] 78 y.o. 09/19/2012 9:11 AM    Principle Diagnosis: 78 year old gentleman with diagnosis of pancytopenia and more specifically severe anemia likely due to myelofibrosis. He he might have an element of myelodysplastic syndrome as well  Current therapy:  Supportive transfusions every 2-4 weeks as needed  Interim History:  Kyle Heath presents today for a followup visit with two of his daughters. He is a pleasant 78 year old gentleman seen for the first time on 02/06/2012. At that time he had presented with a hemoglobin of 6.1 white cell count of 2.0. His peripheral smear indicated an infiltrative bone marrow process. He did have a bone marrow biopsy performed on 02/11/2012 that showed an extensive fibrosis indicating a myeloproliferative disorder more specifically myelofibrosis. He has been getting Aranesp injections and PRBCs every 2 weeks. Kyle Heath continues to reside at a SNF. Thinks he is getting stronger. He can ambulate with walker short distances. Able to toilet himself and dress himself. PT has discharged patient has he is at maximum potential. Fatigue is stable. Denies chest pain, shortness of breath, and dyspnea. Remains on Lasix for CHF/fluid overload.  Appetite is better with an increase in weight.  No new complications at this time.   Medications: I have reviewed the patient's current medications. Current outpatient prescriptions:aspirin 81 MG tablet, Take 81 mg by mouth daily after breakfast. , Disp: , Rfl: ;  carvedilol (COREG) 25 MG tablet, Take 1 tablet (25 mg total) by mouth 2 (two) times daily with a meal., Disp: 60 tablet, Rfl: 0;  diltiazem (CARDIZEM CD) 360 MG 24 hr capsule, Take 1 capsule (360 mg total) by mouth daily., Disp: 30 capsule, Rfl: 2;  febuxostat (ULORIC) 40 MG tablet, Take 40 mg by mouth daily., Disp: , Rfl:  furosemide (LASIX) 40 MG tablet, Take 1 tablet (40 mg total) by mouth daily., Disp: 30  tablet, Rfl: 1;  HYDROcodone-acetaminophen (VICODIN) 5-500 MG per tablet, Take 1 tablet by mouth Every 6 hours as needed., Disp: , Rfl: ;  levothyroxine (SYNTHROID, LEVOTHROID) 88 MCG tablet, Take 88 mcg by mouth daily., Disp: , Rfl: ;  losartan (COZAAR) 100 MG tablet, Take 0.5 tablets (50 mg total) by mouth daily as needed. For swelling, Disp: 30 tablet, Rfl: 2 Multiple Vitamin (MULTIVITAMIN) capsule, Take 1 capsule by mouth daily., Disp: , Rfl: ;  traMADol (ULTRAM) 50 MG tablet, Take 1 tablet by mouth every 6 (six) hours as needed., Disp: , Rfl:   Allergies: No Known Allergies  Past Medical History, Surgical history, Social history, and Family History were reviewed and updated.  Review of Systems: Constitutional:  Negative for fever, chills, night sweats,  pain. Cardiovascular: no chest pain or dyspnea on exertion Respiratory: negative Neurological: negative Dermatological: negative ENT: negative Skin: Negative. Gastrointestinal: negative Genito-Urinary: negative Hematological and Lymphatic: negative Breast: negative Musculoskeletal: negative Remaining ROS negative.  Physical Exam: Blood pressure 140/52, pulse 88, temperature 98.5 F (36.9 C), temperature source Oral, resp. rate 20, height 5\' 8"  (1.727 m), weight 188 lb 14.4 oz (85.684 kg). ECOG: 3 General appearance: alert. Pleasantly confused. Head: Normocephalic, without obvious abnormality, atraumatic Neck: no adenopathy, no carotid bruit, no JVD, supple, symmetrical, trachea midline and thyroid not enlarged, symmetric, no tenderness/mass/nodules Lymph nodes: Cervical, supraclavicular, and axillary nodes normal. Heart:regular rate and rhythm, S1, S2 normal, no murmur, click, rub or gallop Lung:chest clear, no wheezing, rales, normal symmetric air entry Abdomen: soft, non-tender, without masses or organomegaly EXT:no erythema, induration, or nodules. 1+  edema bilaterally..   Lab Results:  CBC    Component Value Date/Time     WBC 3.5* 09/19/2012 0837   WBC 4.3 07/06/2012 0521   RBC 2.91* 09/19/2012 0837   RBC 3.12* 07/06/2012 0521   HGB 8.4* 09/19/2012 0837   HGB 9.1* 07/06/2012 0521   HCT 27.3* 09/19/2012 0837   HCT 29.2* 07/06/2012 0521   PLT 191 09/19/2012 0837   PLT 331 07/06/2012 0521   MCV 93.8 09/19/2012 0837   MCV 93.6 07/06/2012 0521   MCH 28.9 09/19/2012 0837   MCH 29.2 07/06/2012 0521   MCHC 30.8* 09/19/2012 0837   MCHC 31.2 07/06/2012 0521   RDW 27.1* 09/19/2012 0837   RDW 22.9* 07/06/2012 0521   LYMPHSABS 1.1 09/19/2012 0837   MONOABS 0.2 09/19/2012 0837   EOSABS 0.0 09/19/2012 0837   BASOSABS 0.0 09/19/2012 0837    Impression and Plan:  78 year old gentleman with the following issues:  1. 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 He could also have an element of myelodysplastic syndrome as well. He is currently on supportive care only.He does not have abdominal pain or a large spleen. Plan to check CBC every 2 weeks and  transfuse when symptomatic from his anemia. He will receive 2 units PRBCs today and Aranesp injection today. His overall prognosis is very poor. I have explained to the patient and his daughters that his prognosis is poor and he is not a candidate for any aggressive treatment.   2. Neutropenia: Resolved.  3. Thrombocytopenia : Resolved. Platelets are slightly elevated today. Will monitor.   4. HTN: On Cardizem and Cozaar per PCP.  5. DM: Diet-controlled. He is followed by PCP.  6. Followup: Every 2 weeks for lab, injection, and possible transfusion and he will be seen for a visit in 4 weeks.     Kyle Heath 11/1/20139:11 AM

## 2012-09-19 NOTE — Telephone Encounter (Signed)
gv and printed appt schedule for NOV and DEC for pt.

## 2012-09-20 LAB — TYPE AND SCREEN
ABO/RH(D): O POS
Antibody Screen: NEGATIVE
Unit division: 0

## 2012-10-01 ENCOUNTER — Other Ambulatory Visit: Payer: Self-pay | Admitting: Oncology

## 2012-10-01 DIAGNOSIS — D649 Anemia, unspecified: Secondary | ICD-10-CM

## 2012-10-08 ENCOUNTER — Other Ambulatory Visit (HOSPITAL_BASED_OUTPATIENT_CLINIC_OR_DEPARTMENT_OTHER): Payer: Medicare Other | Admitting: Lab

## 2012-10-08 ENCOUNTER — Ambulatory Visit (HOSPITAL_BASED_OUTPATIENT_CLINIC_OR_DEPARTMENT_OTHER): Payer: Medicare Other

## 2012-10-08 VITALS — BP 111/54 | HR 74 | Temp 99.4°F | Resp 20

## 2012-10-08 DIAGNOSIS — D649 Anemia, unspecified: Secondary | ICD-10-CM

## 2012-10-08 DIAGNOSIS — D471 Chronic myeloproliferative disease: Secondary | ICD-10-CM

## 2012-10-08 LAB — CBC WITH DIFFERENTIAL/PLATELET
BASO%: 1.5 % (ref 0.0–2.0)
EOS%: 0.8 % (ref 0.0–7.0)
HCT: 25.1 % — ABNORMAL LOW (ref 38.4–49.9)
LYMPH%: 26.4 % (ref 14.0–49.0)
MCH: 30.3 pg (ref 27.2–33.4)
MCHC: 33.1 g/dL (ref 32.0–36.0)
MCV: 91.6 fL (ref 79.3–98.0)
MONO%: 7.9 % (ref 0.0–14.0)
NEUT%: 63.4 % (ref 39.0–75.0)
Platelets: 252 10*3/uL (ref 140–400)

## 2012-10-08 LAB — TECHNOLOGIST REVIEW: Technologist Review: 2

## 2012-10-08 MED ORDER — DIPHENHYDRAMINE HCL 25 MG PO CAPS
25.0000 mg | ORAL_CAPSULE | Freq: Once | ORAL | Status: AC
  Administered 2012-10-08: 25 mg via ORAL

## 2012-10-08 MED ORDER — SODIUM CHLORIDE 0.9 % IV SOLN: 250.0000 mL | Freq: Once | INTRAVENOUS | Status: DC

## 2012-10-08 MED ORDER — ACETAMINOPHEN 325 MG PO TABS
650.0000 mg | ORAL_TABLET | Freq: Once | ORAL | Status: AC
  Administered 2012-10-08: 650 mg via ORAL

## 2012-10-08 NOTE — Patient Instructions (Addendum)
Blood Transfusion Information WHAT IS A BLOOD TRANSFUSION? A transfusion is the replacement of blood or some of its parts. Blood is made up of multiple cells which provide different functions.  Red blood cells carry oxygen and are used for blood loss replacement.  White blood cells fight against infection.  Platelets control bleeding.  Plasma helps clot blood.  Other blood products are available for specialized needs, such as hemophilia or other clotting disorders. BEFORE THE TRANSFUSION  Who gives blood for transfusions?   You may be able to donate blood to be used at a later date on yourself (autologous donation).  Relatives can be asked to donate blood. This is generally not any safer than if you have received blood from a stranger. The same precautions are taken to ensure safety when a relative's blood is donated.  Healthy volunteers who are fully evaluated to make sure their blood is safe. This is blood bank blood. Transfusion therapy is the safest it has ever been in the practice of medicine. Before blood is taken from a donor, a complete history is taken to make sure that person has no history of diseases nor engages in risky social behavior (examples are intravenous drug use or sexual activity with multiple partners). The donor's travel history is screened to minimize risk of transmitting infections, such as malaria. The donated blood is tested for signs of infectious diseases, such as HIV and hepatitis. The blood is then tested to be sure it is compatible with you in order to minimize the chance of a transfusion reaction. If you or a relative donates blood, this is often done in anticipation of surgery and is not appropriate for emergency situations. It takes many days to process the donated blood. RISKS AND COMPLICATIONS Although transfusion therapy is very safe and saves many lives, the main dangers of transfusion include:   Getting an infectious disease.  Developing a  transfusion reaction. This is an allergic reaction to something in the blood you were given. Every precaution is taken to prevent this. The decision to have a blood transfusion has been considered carefully by your caregiver before blood is given. Blood is not given unless the benefits outweigh the risks. AFTER THE TRANSFUSION  Right after receiving a blood transfusion, you will usually feel much better and more energetic. This is especially true if your red blood cells have gotten low (anemic). The transfusion raises the level of the red blood cells which carry oxygen, and this usually causes an energy increase.  The nurse administering the transfusion will monitor you carefully for complications. HOME CARE INSTRUCTIONS  No special instructions are needed after a transfusion. You may find your energy is better. Speak with your caregiver about any limitations on activity for underlying diseases you may have. SEEK MEDICAL CARE IF:   Your condition is not improving after your transfusion.  You develop redness or irritation at the intravenous (IV) site. SEEK IMMEDIATE MEDICAL CARE IF:  Any of the following symptoms occur over the next 12 hours:  Shaking chills.  You have a temperature by mouth above 102 F (38.9 C), not controlled by medicine.  Chest, back, or muscle pain.  People around you feel you are not acting correctly or are confused.  Shortness of breath or difficulty breathing.  Dizziness and fainting.  You get a rash or develop hives.  You have a decrease in urine output.  Your urine turns a dark color or changes to pink, red, or brown. Any of the following   symptoms occur over the next 10 days:  You have a temperature by mouth above 102 F (38.9 C), not controlled by medicine.  Shortness of breath.  Weakness after normal activity.  The white part of the eye turns yellow (jaundice).  You have a decrease in the amount of urine or are urinating less often.  Your  urine turns a dark color or changes to pink, red, or brown. Document Released: 11/02/2000 Document Revised: 01/28/2012 Document Reviewed: 06/21/2008 ExitCare Patient Information 2013 [employer].  

## 2012-10-09 LAB — TYPE AND SCREEN: Unit division: 0

## 2012-10-14 ENCOUNTER — Other Ambulatory Visit: Payer: Self-pay | Admitting: Oncology

## 2012-10-14 DIAGNOSIS — D649 Anemia, unspecified: Secondary | ICD-10-CM

## 2012-10-19 ENCOUNTER — Encounter (HOSPITAL_COMMUNITY)
Admission: RE | Admit: 2012-10-19 | Discharge: 2012-10-19 | Disposition: A | Discharge status: Home / Self Care | Payer: Medicare Other | Source: Ambulatory Visit | Attending: Oncology | Admitting: Oncology

## 2012-10-19 DIAGNOSIS — D649 Anemia, unspecified: Secondary | ICD-10-CM

## 2012-10-22 ENCOUNTER — Other Ambulatory Visit (HOSPITAL_BASED_OUTPATIENT_CLINIC_OR_DEPARTMENT_OTHER): Payer: Medicare Other | Admitting: Lab

## 2012-10-22 ENCOUNTER — Ambulatory Visit (HOSPITAL_BASED_OUTPATIENT_CLINIC_OR_DEPARTMENT_OTHER): Payer: Medicare Other

## 2012-10-22 VITALS — BP 127/59 | HR 73 | Temp 98.7°F | Resp 18

## 2012-10-22 DIAGNOSIS — D471 Chronic myeloproliferative disease: Secondary | ICD-10-CM

## 2012-10-22 DIAGNOSIS — D649 Anemia, unspecified: Secondary | ICD-10-CM

## 2012-10-22 DIAGNOSIS — D47Z9 Other specified neoplasms of uncertain behavior of lymphoid, hematopoietic and related tissue: Secondary | ICD-10-CM

## 2012-10-22 LAB — CBC WITH DIFFERENTIAL/PLATELET
BASO%: 2.7 % — ABNORMAL HIGH (ref 0.0–2.0)
EOS%: 1.5 % (ref 0.0–7.0)
HCT: 25.7 % — ABNORMAL LOW (ref 38.4–49.9)
MCH: 27.7 pg (ref 27.2–33.4)
MCHC: 31.1 g/dL — ABNORMAL LOW (ref 32.0–36.0)
NEUT%: 60.1 % (ref 39.0–75.0)
RDW: 24.5 % — ABNORMAL HIGH (ref 11.0–14.6)
lymph#: 0.8 10*3/uL — ABNORMAL LOW (ref 0.9–3.3)

## 2012-10-22 LAB — TECHNOLOGIST REVIEW: Technologist Review: 5

## 2012-10-22 MED ORDER — DIPHENHYDRAMINE HCL 25 MG PO CAPS
25.0000 mg | ORAL_CAPSULE | Freq: Once | ORAL | Status: AC
  Administered 2012-10-22: 25 mg via ORAL

## 2012-10-22 MED ORDER — ACETAMINOPHEN 325 MG PO TABS
650.0000 mg | ORAL_TABLET | Freq: Once | ORAL | Status: AC
  Administered 2012-10-22: 650 mg via ORAL

## 2012-10-22 NOTE — Patient Instructions (Addendum)
Blood Transfusion   A blood transfusion replaces your blood or some of its parts. Blood is replaced when you have lost blood because of surgery, an accident, or for severe blood conditions like anemia.  You can donate blood to be used on yourself if you have a planned surgery. If you lose blood during that surgery, your own blood can be given back to you.  Any blood given to you is checked to make sure it matches your blood type. Your temperature, blood pressure, and heart rate (vital signs) will be checked often.   GET HELP RIGHT AWAY IF:    You feel sick to your stomach (nauseous) or throw up (vomit).   You have watery poop (diarrhea).   You have shortness of breath or trouble breathing.   You have blood in your pee (urine) or have dark colored pee.   You have chest pain or tightness.   Your eyes or skin turn yellow (jaundice).   You have a temperature by mouth above 102 F (38.9 C), not controlled by medicine.   You start to shake and have chills.   You develop a a red rash (hives) or feel itchy.   You develop lightheadedness or feel confused.   You develop back, joint, or muscle pain.   You do not feel hungry (lost appetite).   You feel tired, restless, or nervous.   You develop belly (abdominal) cramps.  Document Released: 02/01/2009 Document Revised: 01/28/2012 Document Reviewed: 02/01/2009  [employer] Patient Information 2013 [employer]

## 2012-10-23 LAB — TYPE AND SCREEN: Unit division: 0

## 2012-10-29 ENCOUNTER — Other Ambulatory Visit: Payer: Self-pay | Admitting: Oncology

## 2012-10-29 DIAGNOSIS — D649 Anemia, unspecified: Secondary | ICD-10-CM

## 2012-11-05 ENCOUNTER — Telehealth: Payer: Self-pay | Admitting: *Deleted

## 2012-11-05 ENCOUNTER — Other Ambulatory Visit (HOSPITAL_BASED_OUTPATIENT_CLINIC_OR_DEPARTMENT_OTHER): Payer: Medicare Other | Admitting: Lab

## 2012-11-05 ENCOUNTER — Ambulatory Visit (HOSPITAL_BASED_OUTPATIENT_CLINIC_OR_DEPARTMENT_OTHER): Payer: Medicare Other | Admitting: Oncology

## 2012-11-05 VITALS — BP 143/64 | HR 82 | Temp 97.6°F | Resp 18 | Ht 68.0 in | Wt 190.0 lb

## 2012-11-05 DIAGNOSIS — I10 Essential (primary) hypertension: Secondary | ICD-10-CM

## 2012-11-05 DIAGNOSIS — D7581 Myelofibrosis: Secondary | ICD-10-CM

## 2012-11-05 DIAGNOSIS — D471 Chronic myeloproliferative disease: Secondary | ICD-10-CM

## 2012-11-05 DIAGNOSIS — D709 Neutropenia, unspecified: Secondary | ICD-10-CM

## 2012-11-05 DIAGNOSIS — D649 Anemia, unspecified: Secondary | ICD-10-CM

## 2012-11-05 LAB — CBC WITH DIFFERENTIAL/PLATELET
Eosinophils Absolute: 0.1 10*3/uL (ref 0.0–0.5)
MCV: 88.9 fL (ref 79.3–98.0)
MONO%: 8.5 % (ref 0.0–14.0)
NEUT#: 1.6 10*3/uL (ref 1.5–6.5)
RBC: 3.06 10*6/uL — ABNORMAL LOW (ref 4.20–5.82)
RDW: 23 % — ABNORMAL HIGH (ref 11.0–14.6)
WBC: 2.8 10*3/uL — ABNORMAL LOW (ref 4.0–10.3)
lymph#: 0.9 10*3/uL (ref 0.9–3.3)
nRBC: 2 % — ABNORMAL HIGH (ref 0–0)

## 2012-11-05 LAB — TECHNOLOGIST REVIEW: Technologist Review: 4

## 2012-11-05 LAB — HOLD TUBE, BLOOD BANK

## 2012-11-05 MED ORDER — DARBEPOETIN ALFA-POLYSORBATE 300 MCG/0.6ML IJ SOLN
300.0000 ug | Freq: Once | INTRAMUSCULAR | Status: AC
  Administered 2012-11-05: 300 ug via SUBCUTANEOUS
  Filled 2012-11-05: qty 0.6

## 2012-11-05 NOTE — Telephone Encounter (Signed)
Per staff message and POF I have scheduled appts.  [name]  

## 2012-11-05 NOTE — Progress Notes (Signed)
Hematology and Oncology Follow Up Visit  Kyle Heath [id number] [birth year] 78 y.o. 11/05/2012 9:32 AM    Principle Diagnosis: 78 year old gentleman with diagnosis of pancytopenia and more specifically severe anemia likely due to myelofibrosis. He he might have an element of myelodysplastic syndrome as well  Current therapy:  Supportive transfusions every 2-4 weeks as needed  Interim History:  Kyle Heath presents today for a followup visit with two of his daughters. He is a pleasant 69 year old gentleman seen for the first time on 02/06/2012. At that time he had presented with a hemoglobin of 6.1 white cell count of 2.0. His peripheral smear indicated an infiltrative bone marrow process. He did have a bone marrow biopsy performed on 02/11/2012 that showed an extensive fibrosis indicating a myeloproliferative disorder more specifically myelofibrosis. He has been getting Aranesp injections and PRBCs every 2 weeks. Kyle Heath continues to reside at a SNF. Thinks he is getting stronger. He can now ambulate without the aid of a walker. Able to toilet himself and dress himself. PT has discharged patient has he is at maximum potential. Fatigue is stable. Denies chest pain, shortness of breath, and dyspnea. Remains on Lasix for CHF/fluid overload.  Appetite is better with an increase in weight. No new complications at this time.   Medications: I have reviewed the patient's current medications. Current outpatient prescriptions:aspirin 81 MG tablet, Take 81 mg by mouth daily after breakfast. , Disp: , Rfl: ;  carvedilol (COREG) 25 MG tablet, Take 1 tablet (25 mg total) by mouth 2 (two) times daily with a meal., Disp: 60 tablet, Rfl: 0;  diltiazem (CARDIZEM CD) 360 MG 24 hr capsule, Take 1 capsule (360 mg total) by mouth daily., Disp: 30 capsule, Rfl: 2;  febuxostat (ULORIC) 40 MG tablet, Take 40 mg by mouth daily., Disp: , Rfl:  furosemide (LASIX) 40 MG tablet, Take 1 tablet (40 mg total) by mouth daily., Disp: 30  tablet, Rfl: 1;  HYDROcodone-acetaminophen (VICODIN) 5-500 MG per tablet, Take 1 tablet by mouth Every 6 hours as needed., Disp: , Rfl: ;  levothyroxine (SYNTHROID, LEVOTHROID) 88 MCG tablet, Take 88 mcg by mouth daily., Disp: , Rfl: ;  losartan (COZAAR) 100 MG tablet, Take 0.5 tablets (50 mg total) by mouth daily as needed. For swelling, Disp: 30 tablet, Rfl: 2 Multiple Vitamin (MULTIVITAMIN) capsule, Take 1 capsule by mouth daily., Disp: , Rfl: ;  traMADol (ULTRAM) 50 MG tablet, Take 1 tablet by mouth every 6 (six) hours as needed., Disp: , Rfl:   Allergies: No Known Allergies  Past Medical History, Surgical history, Social history, and Family History were reviewed and updated.  Review of Systems: Constitutional:  Negative for fever, chills, night sweats,  pain. Cardiovascular: no chest pain or dyspnea on exertion Respiratory: negative Neurological: negative Dermatological: negative ENT: negative Skin: Negative. Gastrointestinal: negative Genito-Urinary: negative Hematological and Lymphatic: negative Breast: negative Musculoskeletal: negative Remaining ROS negative.  Physical Exam: Blood pressure 143/64, pulse 82, temperature 97.6 F (36.4 C), temperature source Oral, resp. rate 18, height 5\' 8"  (1.727 m), weight 190 lb (86.183 kg). ECOG: 3 General appearance: alert. Pleasantly confused. Head: Normocephalic, without obvious abnormality, atraumatic Neck: no adenopathy, no carotid bruit, no JVD, supple, symmetrical, trachea midline and thyroid not enlarged, symmetric, no tenderness/mass/nodules Lymph nodes: Cervical, supraclavicular, and axillary nodes normal. Heart:regular rate and rhythm, S1, S2 normal, no murmur, click, rub or gallop Lung:chest clear, no wheezing, rales, normal symmetric air entry Abdomen: soft, non-tender, without masses or organomegaly EXT:no erythema, induration, or nodules. 1+  edema bilaterally..   Lab Results:  CBC    Component Value Date/Time   WBC  2.8* 11/05/2012 0812   WBC 4.3 07/06/2012 0521   RBC 3.06* 11/05/2012 0812   RBC 3.12* 07/06/2012 0521   HGB 8.5* 11/05/2012 0812   HGB 9.1* 07/06/2012 0521   HCT 27.2* 11/05/2012 0812   HCT 29.2* 07/06/2012 0521   PLT 227 Large & giant platelets 11/05/2012 0812   PLT 331 07/06/2012 0521   MCV 88.9 11/05/2012 0812   MCV 93.6 07/06/2012 0521   MCH 27.8 11/05/2012 0812   MCH 29.2 07/06/2012 0521   MCHC 31.3* 11/05/2012 0812   MCHC 31.2 07/06/2012 0521   RDW 23.0* 11/05/2012 0812   RDW 22.9* 07/06/2012 0521   LYMPHSABS 0.9 11/05/2012 0812   MONOABS 0.2 11/05/2012 0812   EOSABS 0.1 11/05/2012 0812   BASOSABS 0.1 11/05/2012 0812    Impression and Plan:  78 year old gentleman with the following issues:  1. 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 He could also have an element of myelodysplastic syndrome as well. He is currently on supportive care only.He does not have abdominal pain or a large spleen. Plan to check CBC every 2 weeks and  transfuse when symptomatic from his anemia. His hemoglobin is 8.5 today and he is asymptomatic. Will hold on the transfusion today. He received his Aranesp injection today in the office. His overall prognosis is very poor. I have explained to the patient and his daughters that his prognosis is poor and he is not a candidate for any aggressive treatment.   2. Neutropenia: Stable.  3. Thrombocytopenia : Resolved.   4. HTN: On Cardizem and Cozaar per PCP.  5. DM: Diet-controlled. He is followed by PCP.  6. Followup: Every 2 weeks for lab, injection, and possible transfusion and he will be seen for a visit in 4 weeks.     Kyle Heath 12/18/20139:32 AM

## 2012-11-13 ENCOUNTER — Telehealth: Payer: Self-pay | Admitting: Oncology

## 2012-11-13 NOTE — Telephone Encounter (Signed)
Talked to patient and she is aware of appt on 11/20/12 and 1/17th

## 2012-11-19 DIAGNOSIS — I4892 Unspecified atrial flutter: Secondary | ICD-10-CM

## 2012-11-19 HISTORY — DX: Unspecified atrial flutter: I48.92

## 2012-11-20 ENCOUNTER — Encounter (HOSPITAL_COMMUNITY)
Admission: RE | Admit: 2012-11-20 | Discharge: 2012-11-20 | Disposition: A | Discharge status: Home / Self Care | Payer: Medicare Other | Source: Ambulatory Visit | Attending: Oncology | Admitting: Oncology

## 2012-11-20 ENCOUNTER — Other Ambulatory Visit (HOSPITAL_BASED_OUTPATIENT_CLINIC_OR_DEPARTMENT_OTHER): Payer: Medicare Other | Admitting: Lab

## 2012-11-20 ENCOUNTER — Ambulatory Visit (HOSPITAL_BASED_OUTPATIENT_CLINIC_OR_DEPARTMENT_OTHER): Payer: Medicare Other

## 2012-11-20 VITALS — BP 131/58 | HR 69 | Temp 98.2°F | Resp 18

## 2012-11-20 DIAGNOSIS — D649 Anemia, unspecified: Secondary | ICD-10-CM

## 2012-11-20 DIAGNOSIS — D47Z9 Other specified neoplasms of uncertain behavior of lymphoid, hematopoietic and related tissue: Secondary | ICD-10-CM | POA: Insufficient documentation

## 2012-11-20 DIAGNOSIS — D471 Chronic myeloproliferative disease: Secondary | ICD-10-CM

## 2012-11-20 LAB — CBC WITH DIFFERENTIAL/PLATELET
Basophils Absolute: 0 10*3/uL (ref 0.0–0.1)
Eosinophils Absolute: 0 10*3/uL (ref 0.0–0.5)
HCT: 20.3 % — ABNORMAL LOW (ref 38.4–49.9)
HGB: 6.8 g/dL — CL (ref 13.0–17.1)
MONO#: 0.2 10*3/uL (ref 0.1–0.9)
NEUT#: 1.2 10*3/uL — ABNORMAL LOW (ref 1.5–6.5)
NEUT%: 59.9 % (ref 39.0–75.0)
RDW: 24 % — ABNORMAL HIGH (ref 11.0–14.6)
lymph#: 0.6 10*3/uL — ABNORMAL LOW (ref 0.9–3.3)

## 2012-11-20 LAB — TECHNOLOGIST REVIEW

## 2012-11-20 MED ORDER — ACETAMINOPHEN 325 MG PO TABS
650.0000 mg | ORAL_TABLET | Freq: Once | ORAL | Status: AC
  Administered 2012-11-20: 650 mg via ORAL

## 2012-11-20 MED ORDER — DIPHENHYDRAMINE HCL 25 MG PO CAPS
25.0000 mg | ORAL_CAPSULE | Freq: Once | ORAL | Status: AC
  Administered 2012-11-20: 25 mg via ORAL

## 2012-11-20 NOTE — Patient Instructions (Signed)
Blood Transfusion Information WHAT IS A BLOOD TRANSFUSION? A transfusion is the replacement of blood or some of its parts. Blood is made up of multiple cells which provide different functions.  Red blood cells carry oxygen and are used for blood loss replacement.  White blood cells fight against infection.  Platelets control bleeding.  Plasma helps clot blood.  Other blood products are available for specialized needs, such as hemophilia or other clotting disorders. BEFORE THE TRANSFUSION  Who gives blood for transfusions?   You may be able to donate blood to be used at a later date on yourself (autologous donation).  Relatives can be asked to donate blood. This is generally not any safer than if you have received blood from a stranger. The same precautions are taken to ensure safety when a relative's blood is donated.  Healthy volunteers who are fully evaluated to make sure their blood is safe. This is blood bank blood. Transfusion therapy is the safest it has ever been in the practice of medicine. Before blood is taken from a donor, a complete history is taken to make sure that person has no history of diseases nor engages in risky social behavior (examples are intravenous drug use or sexual activity with multiple partners). The donor's travel history is screened to minimize risk of transmitting infections, such as malaria. The donated blood is tested for signs of infectious diseases, such as HIV and hepatitis. The blood is then tested to be sure it is compatible with you in order to minimize the chance of a transfusion reaction. If you or a relative donates blood, this is often done in anticipation of surgery and is not appropriate for emergency situations. It takes many days to process the donated blood. RISKS AND COMPLICATIONS Although transfusion therapy is very safe and saves many lives, the main dangers of transfusion include:   Getting an infectious disease.  Developing a  transfusion reaction. This is an allergic reaction to something in the blood you were given. Every precaution is taken to prevent this. The decision to have a blood transfusion has been considered carefully by your caregiver before blood is given. Blood is not given unless the benefits outweigh the risks. AFTER THE TRANSFUSION  Right after receiving a blood transfusion, you will usually feel much better and more energetic. This is especially true if your red blood cells have gotten low (anemic). The transfusion raises the level of the red blood cells which carry oxygen, and this usually causes an energy increase.  The nurse administering the transfusion will monitor you carefully for complications. HOME CARE INSTRUCTIONS  No special instructions are needed after a transfusion. You may find your energy is better. Speak with your caregiver about any limitations on activity for underlying diseases you may have. SEEK MEDICAL CARE IF:   Your condition is not improving after your transfusion.  You develop redness or irritation at the intravenous (IV) site. SEEK IMMEDIATE MEDICAL CARE IF:  Any of the following symptoms occur over the next 12 hours:  Shaking chills.  You have a temperature by mouth above 102 F (38.9 C), not controlled by medicine.  Chest, back, or muscle pain.  People around you feel you are not acting correctly or are confused.  Shortness of breath or difficulty breathing.  Dizziness and fainting.  You get a rash or develop hives.  You have a decrease in urine output.  Your urine turns a dark color or changes to pink, red, or brown. Any of the following   symptoms occur over the next 10 days:  You have a temperature by mouth above 102 F (38.9 C), not controlled by medicine.  Shortness of breath.  Weakness after normal activity.  The white part of the eye turns yellow (jaundice).  You have a decrease in the amount of urine or are urinating less often.  Your  urine turns a dark color or changes to pink, red, or brown. Document Released: 11/02/2000 Document Revised: 01/28/2012 Document Reviewed: 06/21/2008 Exit[employer] Patient Information 2013 [employer].  

## 2012-11-21 LAB — TYPE AND SCREEN
Unit division: 0
Unit division: 0

## 2012-11-25 ENCOUNTER — Other Ambulatory Visit: Payer: Self-pay | Admitting: Oncology

## 2012-11-25 DIAGNOSIS — D649 Anemia, unspecified: Secondary | ICD-10-CM

## 2012-12-02 ENCOUNTER — Ambulatory Visit: Payer: Medicare Other | Admitting: Oncology

## 2012-12-05 ENCOUNTER — Other Ambulatory Visit (HOSPITAL_BASED_OUTPATIENT_CLINIC_OR_DEPARTMENT_OTHER): Payer: Medicare Other | Admitting: Lab

## 2012-12-05 ENCOUNTER — Encounter: Payer: Self-pay | Admitting: Oncology

## 2012-12-05 ENCOUNTER — Ambulatory Visit (HOSPITAL_BASED_OUTPATIENT_CLINIC_OR_DEPARTMENT_OTHER): Payer: Medicare Other

## 2012-12-05 ENCOUNTER — Telehealth: Payer: Self-pay | Admitting: Oncology

## 2012-12-05 ENCOUNTER — Telehealth: Payer: Self-pay | Admitting: *Deleted

## 2012-12-05 ENCOUNTER — Ambulatory Visit (HOSPITAL_BASED_OUTPATIENT_CLINIC_OR_DEPARTMENT_OTHER): Payer: Medicare Other | Admitting: Oncology

## 2012-12-05 VITALS — BP 153/65 | HR 85 | Temp 98.2°F | Resp 18

## 2012-12-05 VITALS — BP 125/58 | HR 107 | Temp 97.4°F | Resp 20 | Ht 68.0 in | Wt 190.9 lb

## 2012-12-05 DIAGNOSIS — D471 Chronic myeloproliferative disease: Secondary | ICD-10-CM

## 2012-12-05 DIAGNOSIS — D47Z9 Other specified neoplasms of uncertain behavior of lymphoid, hematopoietic and related tissue: Secondary | ICD-10-CM

## 2012-12-05 DIAGNOSIS — D649 Anemia, unspecified: Secondary | ICD-10-CM

## 2012-12-05 LAB — CBC WITH DIFFERENTIAL/PLATELET
Basophils Absolute: 0 10*3/uL (ref 0.0–0.1)
EOS%: 0.8 % (ref 0.0–7.0)
Eosinophils Absolute: 0 10*3/uL (ref 0.0–0.5)
HGB: 6.9 g/dL — CL (ref 13.0–17.1)
MONO#: 0.2 10*3/uL (ref 0.1–0.9)
NEUT#: 1.4 10*3/uL — ABNORMAL LOW (ref 1.5–6.5)
RDW: 22 % — ABNORMAL HIGH (ref 11.0–14.6)
WBC: 2.4 10*3/uL — ABNORMAL LOW (ref 4.0–10.3)
lymph#: 0.7 10*3/uL — ABNORMAL LOW (ref 0.9–3.3)

## 2012-12-05 MED ORDER — ACETAMINOPHEN 325 MG PO TABS
650.0000 mg | ORAL_TABLET | Freq: Once | ORAL | Status: AC
  Administered 2012-12-05: 650 mg via ORAL

## 2012-12-05 MED ORDER — SODIUM CHLORIDE 0.9 % IV SOLN
250.0000 mL | Freq: Once | INTRAVENOUS | Status: AC
  Administered 2012-12-05: 250 mL via INTRAVENOUS

## 2012-12-05 MED ORDER — FUROSEMIDE 10 MG/ML IJ SOLN
20.0000 mg | Freq: Once | INTRAMUSCULAR | Status: AC
  Administered 2012-12-05: 20 mg via INTRAVENOUS

## 2012-12-05 MED ORDER — DIPHENHYDRAMINE HCL 25 MG PO CAPS
25.0000 mg | ORAL_CAPSULE | Freq: Once | ORAL | Status: AC
  Administered 2012-12-05: 25 mg via ORAL

## 2012-12-05 MED ORDER — DARBEPOETIN ALFA-POLYSORBATE 300 MCG/0.6ML IJ SOLN
300.0000 ug | Freq: Once | INTRAMUSCULAR | Status: AC
  Administered 2012-12-05: 300 ug via SUBCUTANEOUS
  Filled 2012-12-05: qty 0.6

## 2012-12-05 NOTE — Progress Notes (Signed)
Hematology and Oncology Follow Up Visit  Kyle Heath [id number] 1927-10-01 79 y.o. 12/05/2012 9:44 AM    Principle Diagnosis: 79 year old gentleman with diagnosis of pancytopenia and more specifically severe anemia likely due to myelofibrosis. He he might have an element of myelodysplastic syndrome as well  Current therapy:  Supportive transfusions every 2-4 weeks as needed  Interim History:  Kyle Heath presents today for a followup visit with his daughter. He is a pleasant 66 year old gentleman seen for the first time on 02/06/2012. At that time he had presented with a hemoglobin of 6.1 white cell count of 2.0. His peripheral smear indicated an infiltrative bone marrow process. He did have a bone marrow biopsy performed on 02/11/2012 that showed an extensive fibrosis indicating a myeloproliferative disorder more specifically myelofibrosis. He has been getting Aranesp injections and PRBCs every 2 weeks. He has been discharged from the nursing home since his last visit. Thinks he is getting stronger. He can now ambulate without the aid of a walker. Able to toilet himself and dress himself. Fatigue is stable. Denies chest pain, shortness of breath, and dyspnea. Remains on Lasix for CHF/fluid overload.  Appetite and weight are stable. No new complications at this time.   Medications: I have reviewed the patient's current medications. Current outpatient prescriptions:aspirin 81 MG tablet, Take 81 mg by mouth daily after breakfast. , Disp: , Rfl: ;  carvedilol (COREG) 25 MG tablet, Take 1 tablet (25 mg total) by mouth 2 (two) times daily with a meal., Disp: 60 tablet, Rfl: 0;  diltiazem (CARDIZEM CD) 360 MG 24 hr capsule, Take 1 capsule (360 mg total) by mouth daily., Disp: 30 capsule, Rfl: 2;  febuxostat (ULORIC) 40 MG tablet, Take 40 mg by mouth daily., Disp: , Rfl:  furosemide (LASIX) 40 MG tablet, Take 1 tablet (40 mg total) by mouth daily., Disp: 30 tablet, Rfl: 1;  levothyroxine (SYNTHROID, LEVOTHROID)  88 MCG tablet, Take 88 mcg by mouth daily., Disp: , Rfl: ;  losartan (COZAAR) 100 MG tablet, Take 0.5 tablets (50 mg total) by mouth daily as needed. For swelling, Disp: 30 tablet, Rfl: 2;  Multiple Vitamin (MULTIVITAMIN) capsule, Take 1 capsule by mouth daily., Disp: , Rfl:  traMADol (ULTRAM) 50 MG tablet, Take 1 tablet by mouth every 6 (six) hours as needed., Disp: , Rfl:   Allergies: No Known Allergies  Past Medical History, Surgical history, Social history, and Family History were reviewed and updated.  Review of Systems: Constitutional:  Negative for fever, chills, night sweats,  pain. Cardiovascular: no chest pain or dyspnea on exertion Respiratory: negative Neurological: negative Dermatological: negative ENT: negative Skin: Negative. Gastrointestinal: negative Genito-Urinary: negative Hematological and Lymphatic: negative Breast: negative Musculoskeletal: negative Remaining ROS negative.  Physical Exam: Blood pressure 125/58, pulse 107, temperature 97.4 F (36.3 C), temperature source Oral, resp. rate 20, height 5\' 8"  (1.727 m), weight 190 lb 14.4 oz (86.592 kg). ECOG: 2-3 General appearance: alert. Pleasantly confused. Head: Normocephalic, without obvious abnormality, atraumatic Neck: no adenopathy, no carotid bruit, no JVD, supple, symmetrical, trachea midline and thyroid not enlarged, symmetric, no tenderness/mass/nodules Lymph nodes: Cervical, supraclavicular, and axillary nodes normal. Heart:regular rate and rhythm, S1, S2 normal, no murmur, click, rub or gallop Lung:chest clear, no wheezing, rales, normal symmetric air entry Abdomen: soft, non-tender, without masses or organomegaly EXT:no erythema, induration, or nodules. 1+ edema bilaterally..   Lab Results:  CBC    Component Value Date/Time   WBC 2.4* 12/05/2012 0901   WBC 4.3 07/06/2012 0521   RBC 2.31*  12/05/2012 0901   RBC 3.12* 07/06/2012 0521   HGB 6.9* 12/05/2012 0901   HGB 9.1* 07/06/2012 0521   HCT 20.7*  12/05/2012 0901   HCT 29.2* 07/06/2012 0521   PLT 198 12/05/2012 0901   PLT 331 07/06/2012 0521   MCV 89.3 12/05/2012 0901   MCV 93.6 07/06/2012 0521   MCH 30.0 12/05/2012 0901   MCH 29.2 07/06/2012 0521   MCHC 33.6 12/05/2012 0901   MCHC 31.2 07/06/2012 0521   RDW 22.0* 12/05/2012 0901   RDW 22.9* 07/06/2012 0521   LYMPHSABS 0.7* 12/05/2012 0901   MONOABS 0.2 12/05/2012 0901   EOSABS 0.0 12/05/2012 0901   BASOSABS 0.0 12/05/2012 0901    Impression and Plan:  79 year old gentleman with the following issues:  1. 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 He could also have an element of myelodysplastic syndrome as well. He is currently on supportive care only.He does not have abdominal pain or a large spleen. Plan to check CBC every 2 weeks and  transfuse when symptomatic from his anemia. His hemoglobin is 6.9 today. Will given 2 units PRBCs and Aranesp today. His overall prognosis is very poor. I have explained to the patient and his daughter that his prognosis is poor and he is not a candidate for any aggressive treatment.   2. Neutropenia: Stable.  3. Thrombocytopenia : Resolved.   4. HTN: On Cardizem and Cozaar per PCP.  5. DM: Diet-controlled. He is followed by PCP.  6. Followup: Every 2 weeks for lab, injection, and possible transfusion and he will be seen for a visit in 6 weeks.     [place], [place] 1/17/20149:44 AM

## 2012-12-05 NOTE — Telephone Encounter (Signed)
Per staff message and POF I have scheduled appts.  [name]  

## 2012-12-05 NOTE — Telephone Encounter (Signed)
appts made and printed for pt,pt aware that blood will be added and an email has been sent to mw

## 2012-12-05 NOTE — Patient Instructions (Signed)
Blood Transfusion Information WHAT IS A BLOOD TRANSFUSION? A transfusion is the replacement of blood or some of its parts. Blood is made up of multiple cells which provide different functions.  Red blood cells carry oxygen and are used for blood loss replacement.  White blood cells fight against infection.  Platelets control bleeding.  Plasma helps clot blood.  Other blood products are available for specialized needs, such as hemophilia or other clotting disorders. BEFORE THE TRANSFUSION  Who gives blood for transfusions?   You may be able to donate blood to be used at a later date on yourself (autologous donation).  Relatives can be asked to donate blood. This is generally not any safer than if you have received blood from a stranger. The same precautions are taken to ensure safety when a relative's blood is donated.  Healthy volunteers who are fully evaluated to make sure their blood is safe. This is blood bank blood. Transfusion therapy is the safest it has ever been in the practice of medicine. Before blood is taken from a donor, a complete history is taken to make sure that person has no history of diseases nor engages in risky social behavior (examples are intravenous drug use or sexual activity with multiple partners). The donor's travel history is screened to minimize risk of transmitting infections, such as malaria. The donated blood is tested for signs of infectious diseases, such as HIV and hepatitis. The blood is then tested to be sure it is compatible with you in order to minimize the chance of a transfusion reaction. If you or a relative donates blood, this is often done in anticipation of surgery and is not appropriate for emergency situations. It takes many days to process the donated blood. RISKS AND COMPLICATIONS Although transfusion therapy is very safe and saves many lives, the main dangers of transfusion include:   Getting an infectious disease.  Developing a  transfusion reaction. This is an allergic reaction to something in the blood you were given. Every precaution is taken to prevent this. The decision to have a blood transfusion has been considered carefully by your caregiver before blood is given. Blood is not given unless the benefits outweigh the risks. AFTER THE TRANSFUSION  Right after receiving a blood transfusion, you will usually feel much better and more energetic. This is especially true if your red blood cells have gotten low (anemic). The transfusion raises the level of the red blood cells which carry oxygen, and this usually causes an energy increase.  The nurse administering the transfusion will monitor you carefully for complications. HOME CARE INSTRUCTIONS  No special instructions are needed after a transfusion. You may find your energy is better. Speak with your caregiver about any limitations on activity for underlying diseases you may have. SEEK MEDICAL CARE IF:   Your condition is not improving after your transfusion.  You develop redness or irritation at the intravenous (IV) site. SEEK IMMEDIATE MEDICAL CARE IF:  Any of the following symptoms occur over the next 12 hours:  Shaking chills.  You have a temperature by mouth above 102 F (38.9 C), not controlled by medicine.  Chest, back, or muscle pain.  People around you feel you are not acting correctly or are confused.  Shortness of breath or difficulty breathing.  Dizziness and fainting.  You get a rash or develop hives.  You have a decrease in urine output.  Your urine turns a dark color or changes to pink, red, or brown. Any of the following   symptoms occur over the next 10 days:  You have a temperature by mouth above 102 F (38.9 C), not controlled by medicine.  Shortness of breath.  Weakness after normal activity.  The white part of the eye turns yellow (jaundice).  You have a decrease in the amount of urine or are urinating less often.  Your  urine turns a dark color or changes to pink, red, or brown. Document Released: 11/02/2000 Document Revised: 01/28/2012 Document Reviewed: 06/21/2008 Exit[employer] Patient Information 2013 [employer].  

## 2012-12-06 LAB — TYPE AND SCREEN: Unit division: 0

## 2012-12-09 ENCOUNTER — Ambulatory Visit: Payer: Medicare Other | Admitting: Oncology

## 2012-12-12 ENCOUNTER — Other Ambulatory Visit: Payer: Self-pay | Admitting: Oncology

## 2012-12-12 DIAGNOSIS — D649 Anemia, unspecified: Secondary | ICD-10-CM

## 2012-12-19 ENCOUNTER — Other Ambulatory Visit (HOSPITAL_BASED_OUTPATIENT_CLINIC_OR_DEPARTMENT_OTHER): Payer: Medicare Other | Admitting: Lab

## 2012-12-19 ENCOUNTER — Ambulatory Visit (HOSPITAL_BASED_OUTPATIENT_CLINIC_OR_DEPARTMENT_OTHER): Payer: Medicare Other

## 2012-12-19 VITALS — BP 143/61 | HR 87 | Temp 99.0°F | Resp 20

## 2012-12-19 DIAGNOSIS — D471 Chronic myeloproliferative disease: Secondary | ICD-10-CM

## 2012-12-19 DIAGNOSIS — D649 Anemia, unspecified: Secondary | ICD-10-CM

## 2012-12-19 LAB — CBC WITH DIFFERENTIAL/PLATELET
Eosinophils Absolute: 0 10*3/uL (ref 0.0–0.5)
HGB: 7.2 g/dL — ABNORMAL LOW (ref 13.0–17.1)
LYMPH%: 26.8 % (ref 14.0–49.0)
MONO#: 0.2 10*3/uL (ref 0.1–0.9)
NEUT#: 1.6 10*3/uL (ref 1.5–6.5)
Platelets: 212 10*3/uL (ref 140–400)
RBC: 2.4 10*6/uL — ABNORMAL LOW (ref 4.20–5.82)
WBC: 2.6 10*3/uL — ABNORMAL LOW (ref 4.0–10.3)

## 2012-12-19 LAB — TECHNOLOGIST REVIEW

## 2012-12-19 LAB — HOLD TUBE, BLOOD BANK

## 2012-12-19 MED ORDER — HEPARIN SOD (PORK) LOCK FLUSH 100 UNIT/ML IV SOLN
250.0000 [IU] | INTRAVENOUS | Status: DC | PRN
  Filled 2012-12-19: qty 5

## 2012-12-19 MED ORDER — HEPARIN SOD (PORK) LOCK FLUSH 100 UNIT/ML IV SOLN
500.0000 [IU] | Freq: Every day | INTRAVENOUS | Status: DC | PRN
  Filled 2012-12-19: qty 5

## 2012-12-19 MED ORDER — SODIUM CHLORIDE 0.9 % IJ SOLN
10.0000 mL | INTRAMUSCULAR | Status: DC | PRN
  Filled 2012-12-19: qty 10

## 2012-12-19 MED ORDER — DIPHENHYDRAMINE HCL 25 MG PO CAPS
25.0000 mg | ORAL_CAPSULE | Freq: Once | ORAL | Status: AC
  Administered 2012-12-19: 25 mg via ORAL

## 2012-12-19 MED ORDER — SODIUM CHLORIDE 0.9 % IJ SOLN
3.0000 mL | INTRAMUSCULAR | Status: DC | PRN
  Filled 2012-12-19: qty 10

## 2012-12-19 MED ORDER — SODIUM CHLORIDE 0.9 % IV SOLN
250.0000 mL | Freq: Once | INTRAVENOUS | Status: AC
  Administered 2012-12-19: 250 mL via INTRAVENOUS

## 2012-12-19 MED ORDER — ACETAMINOPHEN 325 MG PO TABS
650.0000 mg | ORAL_TABLET | Freq: Once | ORAL | Status: AC
  Administered 2012-12-19: 650 mg via ORAL

## 2012-12-19 NOTE — Patient Instructions (Signed)
Blood Transfusion Information WHAT IS A BLOOD TRANSFUSION? A transfusion is the replacement of blood or some of its parts. Blood is made up of multiple cells which provide different functions.  Red blood cells carry oxygen and are used for blood loss replacement.  White blood cells fight against infection.  Platelets control bleeding.  Plasma helps clot blood.  Other blood products are available for specialized needs, such as hemophilia or other clotting disorders. BEFORE THE TRANSFUSION  Who gives blood for transfusions?   You may be able to donate blood to be used at a later date on yourself (autologous donation).  Relatives can be asked to donate blood. This is generally not any safer than if you have received blood from a stranger. The same precautions are taken to ensure safety when a relative's blood is donated.  Healthy volunteers who are fully evaluated to make sure their blood is safe. This is blood bank blood. Transfusion therapy is the safest it has ever been in the practice of medicine. Before blood is taken from a donor, a complete history is taken to make sure that person has no history of diseases nor engages in risky social behavior (examples are intravenous drug use or sexual activity with multiple partners). The donor's travel history is screened to minimize risk of transmitting infections, such as malaria. The donated blood is tested for signs of infectious diseases, such as HIV and hepatitis. The blood is then tested to be sure it is compatible with you in order to minimize the chance of a transfusion reaction. If you or a relative donates blood, this is often done in anticipation of surgery and is not appropriate for emergency situations. It takes many days to process the donated blood. RISKS AND COMPLICATIONS Although transfusion therapy is very safe and saves many lives, the main dangers of transfusion include:   Getting an infectious disease.  Developing a  transfusion reaction. This is an allergic reaction to something in the blood you were given. Every precaution is taken to prevent this. The decision to have a blood transfusion has been considered carefully by your caregiver before blood is given. Blood is not given unless the benefits outweigh the risks. AFTER THE TRANSFUSION  Right after receiving a blood transfusion, you will usually feel much better and more energetic. This is especially true if your red blood cells have gotten low (anemic). The transfusion raises the level of the red blood cells which carry oxygen, and this usually causes an energy increase.  The nurse administering the transfusion will monitor you carefully for complications. HOME CARE INSTRUCTIONS  No special instructions are needed after a transfusion. You may find your energy is better. Speak with your caregiver about any limitations on activity for underlying diseases you may have. SEEK MEDICAL CARE IF:   Your condition is not improving after your transfusion.  You develop redness or irritation at the intravenous (IV) site. SEEK IMMEDIATE MEDICAL CARE IF:  Any of the following symptoms occur over the next 12 hours:  Shaking chills.  You have a temperature by mouth above 102 F (38.9 C), not controlled by medicine.  Chest, back, or muscle pain.  People around you feel you are not acting correctly or are confused.  Shortness of breath or difficulty breathing.  Dizziness and fainting.  You get a rash or develop hives.  You have a decrease in urine output.  Your urine turns a dark color or changes to pink, red, or brown. Any of the following   symptoms occur over the next 10 days:  You have a temperature by mouth above 102 F (38.9 C), not controlled by medicine.  Shortness of breath.  Weakness after normal activity.  The white part of the eye turns yellow (jaundice).  You have a decrease in the amount of urine or are urinating less often.  Your  urine turns a dark color or changes to pink, red, or brown. Document Released: 11/02/2000 Document Revised: 01/28/2012 Document Reviewed: 06/21/2008 Exit[employer] Patient Information 2013 [employer].  

## 2012-12-20 ENCOUNTER — Encounter (HOSPITAL_COMMUNITY)
Admission: RE | Admit: 2012-12-20 | Discharge: 2012-12-20 | Disposition: A | Discharge status: Home / Self Care | Payer: Medicare Other | Source: Ambulatory Visit | Attending: Oncology | Admitting: Oncology

## 2012-12-20 DIAGNOSIS — D649 Anemia, unspecified: Secondary | ICD-10-CM | POA: Insufficient documentation

## 2012-12-20 LAB — TYPE AND SCREEN: Unit division: 0

## 2012-12-24 ENCOUNTER — Other Ambulatory Visit: Payer: Self-pay | Admitting: Oncology

## 2012-12-24 DIAGNOSIS — D649 Anemia, unspecified: Secondary | ICD-10-CM

## 2013-01-01 ENCOUNTER — Other Ambulatory Visit: Payer: Self-pay | Admitting: *Deleted

## 2013-01-02 ENCOUNTER — Other Ambulatory Visit (HOSPITAL_BASED_OUTPATIENT_CLINIC_OR_DEPARTMENT_OTHER): Payer: Medicare Other | Admitting: Lab

## 2013-01-02 ENCOUNTER — Ambulatory Visit (HOSPITAL_BASED_OUTPATIENT_CLINIC_OR_DEPARTMENT_OTHER): Payer: Medicare Other

## 2013-01-02 VITALS — BP 137/69 | HR 68 | Temp 98.0°F | Resp 20

## 2013-01-02 DIAGNOSIS — D649 Anemia, unspecified: Secondary | ICD-10-CM

## 2013-01-02 DIAGNOSIS — D471 Chronic myeloproliferative disease: Secondary | ICD-10-CM

## 2013-01-02 LAB — CBC WITH DIFFERENTIAL/PLATELET
Basophils Absolute: 0.1 10*3/uL (ref 0.0–0.1)
Eosinophils Absolute: 0 10*3/uL (ref 0.0–0.5)
HGB: 6.8 g/dL — CL (ref 13.0–17.1)
LYMPH%: 29.8 % (ref 14.0–49.0)
MCV: 91 fL (ref 79.3–98.0)
MONO%: 5.3 % (ref 0.0–14.0)
NEUT#: 1.7 10*3/uL (ref 1.5–6.5)
NEUT%: 60.6 % (ref 39.0–75.0)
Platelets: 319 10*3/uL (ref 140–400)

## 2013-01-02 LAB — TECHNOLOGIST REVIEW: Technologist Review: 2

## 2013-01-02 MED ORDER — DIPHENHYDRAMINE HCL 25 MG PO CAPS
25.0000 mg | ORAL_CAPSULE | Freq: Once | ORAL | Status: AC
  Administered 2013-01-02: 25 mg via ORAL

## 2013-01-02 MED ORDER — FUROSEMIDE 10 MG/ML IJ SOLN
20.0000 mg | Freq: Once | INTRAMUSCULAR | Status: AC
  Administered 2013-01-02: 20 mg via INTRAVENOUS

## 2013-01-02 MED ORDER — SODIUM CHLORIDE 0.9 % IV SOLN
INTRAVENOUS | Status: DC
  Administered 2013-01-02: 10:00:00 via INTRAVENOUS

## 2013-01-02 MED ORDER — DARBEPOETIN ALFA-POLYSORBATE 300 MCG/0.6ML IJ SOLN
300.0000 ug | Freq: Once | INTRAMUSCULAR | Status: AC
  Administered 2013-01-02: 300 ug via SUBCUTANEOUS
  Filled 2013-01-02: qty 0.6

## 2013-01-02 MED ORDER — ACETAMINOPHEN 325 MG PO TABS
650.0000 mg | ORAL_TABLET | Freq: Once | ORAL | Status: AC
  Administered 2013-01-02: 650 mg via ORAL

## 2013-01-02 NOTE — Progress Notes (Signed)
Pt's daughter, Misty Stanley, states pt usually has lasix ordered with blood transfusion.  She states pt's legs have been swollen for about 1 week, and pt takes lasix 40 mg daily as prescribed by PCP, which pt will be seeing soon.  No pain or redness noted, per pt.  Verbal order received from Dr. Clelia Croft and read back, to administer 20 mg IV lasix x 1 in between units.

## 2013-01-02 NOTE — Patient Instructions (Addendum)
Blood Transfusion Information WHAT IS A BLOOD TRANSFUSION? A transfusion is the replacement of blood or some of its parts. Blood is made up of multiple cells which provide different functions.  Red blood cells carry oxygen and are used for blood loss replacement.  White blood cells fight against infection.  Platelets control bleeding.  Plasma helps clot blood.  Other blood products are available for specialized needs, such as hemophilia or other clotting disorders. BEFORE THE TRANSFUSION  Who gives blood for transfusions?   You may be able to donate blood to be used at a later date on yourself (autologous donation).  Relatives can be asked to donate blood. This is generally not any safer than if you have received blood from a stranger. The same precautions are taken to ensure safety when a relative's blood is donated.  Healthy volunteers who are fully evaluated to make sure their blood is safe. This is blood bank blood. Transfusion therapy is the safest it has ever been in the practice of medicine. Before blood is taken from a donor, a complete history is taken to make sure that person has no history of diseases nor engages in risky social behavior (examples are intravenous drug use or sexual activity with multiple partners). The donor's travel history is screened to minimize risk of transmitting infections, such as malaria. The donated blood is tested for signs of infectious diseases, such as HIV and hepatitis. The blood is then tested to be sure it is compatible with you in order to minimize the chance of a transfusion reaction. If you or a relative donates blood, this is often done in anticipation of surgery and is not appropriate for emergency situations. It takes many days to process the donated blood. RISKS AND COMPLICATIONS Although transfusion therapy is very safe and saves many lives, the main dangers of transfusion include:   Getting an infectious disease.  Developing a  transfusion reaction. This is an allergic reaction to something in the blood you were given. Every precaution is taken to prevent this. The decision to have a blood transfusion has been considered carefully by your caregiver before blood is given. Blood is not given unless the benefits outweigh the risks. AFTER THE TRANSFUSION  Right after receiving a blood transfusion, you will usually feel much better and more energetic. This is especially true if your red blood cells have gotten low (anemic). The transfusion raises the level of the red blood cells which carry oxygen, and this usually causes an energy increase.  The nurse administering the transfusion will monitor you carefully for complications. HOME CARE INSTRUCTIONS  No special instructions are needed after a transfusion. You may find your energy is better. Speak with your caregiver about any limitations on activity for underlying diseases you may have. SEEK MEDICAL CARE IF:   Your condition is not improving after your transfusion.  You develop redness or irritation at the intravenous (IV) site. SEEK IMMEDIATE MEDICAL CARE IF:  Any of the following symptoms occur over the next 12 hours:  Shaking chills.  You have a temperature by mouth above 102 F (38.9 C), not controlled by medicine.  Chest, back, or muscle pain.  People around you feel you are not acting correctly or are confused.  Shortness of breath or difficulty breathing.  Dizziness and fainting.  You get a rash or develop hives.  You have a decrease in urine output.  Your urine turns a dark color or changes to pink, red, or brown. Any of the following   symptoms occur over the next 10 days:  You have a temperature by mouth above 102 F (38.9 C), not controlled by medicine.  Shortness of breath.  Weakness after normal activity.  The white part of the eye turns yellow (jaundice).  You have a decrease in the amount of urine or are urinating less often.  Your  urine turns a dark color or changes to pink, red, or brown. Document Released: 11/02/2000 Document Revised: 01/28/2012 Document Reviewed: 06/21/2008 [facility] Patient Information 2013 [place], [place]. Darbepoetin Alfa injection What is this medicine? DARBEPOETIN ALFA (dar be POE e tin AL fa) helps your body make more red blood cells. It is used to treat anemia caused by chronic kidney failure and chemotherapy. This medicine may be used for other purposes; ask your health care provider or pharmacist if you have questions. What should I tell my health care provider before I take this medicine? They need to know if you have any of these conditions: -blood clotting disorders or history of blood clots -cancer patient not on chemotherapy -cystic fibrosis -heart disease, such as angina, heart failure, or a history of a heart attack -hemoglobin level of 12 g/dL or greater -high blood pressure -low levels of folate, iron, or vitamin B12 -seizures -an unusual or allergic reaction to darbepoetin, erythropoietin, albumin, hamster proteins, latex, other medicines, foods, dyes, or preservatives -pregnant or trying to get pregnant -breast-feeding How should I use this medicine? This medicine is for injection into a vein or under the skin. It is usually given by a health care professional in a hospital or clinic setting. If you get this medicine at home, you will be taught how to prepare and give this medicine. Do not shake the solution before you withdraw a dose. Use exactly as directed. Take your medicine at regular intervals. Do not take your medicine more often than directed. It is important that you put your used needles and syringes in a special sharps container. Do not put them in a trash can. If you do not have a sharps container, call your pharmacist or healthcare provider to get one. Talk to your pediatrician regarding the use of this medicine in children. While this medicine may be used in children  as young as 1 year for selected conditions, precautions do apply. Overdosage: If you think you have taken too much of this medicine contact a poison control center or emergency room at once. NOTE: This medicine is only for you. Do not share this medicine with others. What if I miss a dose? If you miss a dose, take it as soon as you can. If it is almost time for your next dose, take only that dose. Do not take double or extra doses. What may interact with this medicine? Do not take this medicine with any of the following medications: -epoetin alfa This list may not describe all possible interactions. Give your health care provider a list of all the medicines, herbs, non-prescription drugs, or dietary supplements you use. Also tell them if you smoke, drink alcohol, or use illegal drugs. Some items may interact with your medicine. What should I watch for while using this medicine? Visit your prescriber or health care professional for regular checks on your progress and for the needed blood tests and blood pressure measurements. It is especially important for the doctor to make sure your hemoglobin level is in the desired range, to limit the risk of potential side effects and to give you the best benefit. Keep all appointments  for any recommended tests. Check your blood pressure as directed. Ask your doctor what your blood pressure should be and when you should contact him or her. As your body makes more red blood cells, you may need to take iron, folic acid, or vitamin B supplements. Ask your doctor or health care provider which products are right for you. If you have kidney disease continue dietary restrictions, even though this medication can make you feel better. Talk with your doctor or health care professional about the foods you eat and the vitamins that you take. What side effects may I notice from receiving this medicine? Side effects that you should report to your doctor or health care  professional as soon as possible: -allergic reactions like skin rash, itching or hives, swelling of the face, lips, or tongue -breathing problems -changes in vision -chest pain -confusion, trouble speaking or understanding -feeling faint or lightheaded, falls -high blood pressure -muscle aches or pains -pain, swelling, warmth in the leg -rapid weight gain -severe headaches -sudden numbness or weakness of the face, arm or leg -trouble walking, dizziness, loss of balance or coordination -seizures (convulsions) -swelling of the ankles, feet, hands -unusually weak or tired Side effects that usually do not require medical attention (report to your doctor or health care professional if they continue or are bothersome): -diarrhea -fever, chills (flu-like symptoms) -headaches -nausea, vomiting -redness, stinging, or swelling at site where injected This list may not describe all possible side effects. Call your doctor for medical advice about side effects. You may report side effects to FDA at 1-800-FDA-1088. Where should I keep my medicine? Keep out of the reach of children. Store in a refrigerator between 2 and 8 degrees C (36 and 46 degrees F). Do not freeze. Do not shake. Throw away any unused portion if using a single-dose vial. Throw away any unused medicine after the expiration date. NOTE: This sheet is a summary. It may not cover all possible information. If you have questions about this medicine, talk to your doctor, pharmacist, or health care provider.  2013, [employer]. (10/19/2008 10:23:57 AM)

## 2013-01-03 LAB — TYPE AND SCREEN
Antibody Screen: NEGATIVE
Unit division: 0

## 2013-01-04 ENCOUNTER — Emergency Department (HOSPITAL_COMMUNITY): Payer: Medicare Other

## 2013-01-04 ENCOUNTER — Inpatient Hospital Stay (HOSPITAL_COMMUNITY)
Admission: EM | Admit: 2013-01-04 | Discharge: 2013-01-08 | DRG: 291 | Disposition: A | Discharge status: Home / Self Care | Payer: Medicare Other | Attending: Internal Medicine | Admitting: Internal Medicine

## 2013-01-04 ENCOUNTER — Other Ambulatory Visit: Payer: Self-pay

## 2013-01-04 ENCOUNTER — Encounter (HOSPITAL_COMMUNITY): Payer: Self-pay | Admitting: Emergency Medicine

## 2013-01-04 DIAGNOSIS — J9601 Acute respiratory failure with hypoxia: Secondary | ICD-10-CM

## 2013-01-04 DIAGNOSIS — D649 Anemia, unspecified: Secondary | ICD-10-CM | POA: Diagnosis present

## 2013-01-04 DIAGNOSIS — E039 Hypothyroidism, unspecified: Secondary | ICD-10-CM | POA: Diagnosis present

## 2013-01-04 DIAGNOSIS — D47Z9 Other specified neoplasms of uncertain behavior of lymphoid, hematopoietic and related tissue: Secondary | ICD-10-CM | POA: Diagnosis present

## 2013-01-04 DIAGNOSIS — R531 Weakness: Secondary | ICD-10-CM | POA: Diagnosis present

## 2013-01-04 DIAGNOSIS — I6350 Cerebral infarction due to unspecified occlusion or stenosis of unspecified cerebral artery: Secondary | ICD-10-CM

## 2013-01-04 DIAGNOSIS — N400 Enlarged prostate without lower urinary tract symptoms: Secondary | ICD-10-CM | POA: Diagnosis present

## 2013-01-04 DIAGNOSIS — R5381 Other malaise: Secondary | ICD-10-CM

## 2013-01-04 DIAGNOSIS — R0602 Shortness of breath: Secondary | ICD-10-CM | POA: Diagnosis present

## 2013-01-04 DIAGNOSIS — I639 Cerebral infarction, unspecified: Secondary | ICD-10-CM

## 2013-01-04 DIAGNOSIS — I5033 Acute on chronic diastolic (congestive) heart failure: Principal | ICD-10-CM | POA: Diagnosis present

## 2013-01-04 DIAGNOSIS — J9600 Acute respiratory failure, unspecified whether with hypoxia or hypercapnia: Secondary | ICD-10-CM

## 2013-01-04 DIAGNOSIS — J9620 Acute and chronic respiratory failure, unspecified whether with hypoxia or hypercapnia: Secondary | ICD-10-CM | POA: Diagnosis present

## 2013-01-04 DIAGNOSIS — I10 Essential (primary) hypertension: Secondary | ICD-10-CM | POA: Diagnosis present

## 2013-01-04 DIAGNOSIS — I4892 Unspecified atrial flutter: Secondary | ICD-10-CM

## 2013-01-04 DIAGNOSIS — E8770 Fluid overload, unspecified: Secondary | ICD-10-CM | POA: Diagnosis present

## 2013-01-04 DIAGNOSIS — E119 Type 2 diabetes mellitus without complications: Secondary | ICD-10-CM | POA: Diagnosis present

## 2013-01-04 DIAGNOSIS — R52 Pain, unspecified: Secondary | ICD-10-CM

## 2013-01-04 DIAGNOSIS — R5383 Other fatigue: Secondary | ICD-10-CM | POA: Diagnosis present

## 2013-01-04 DIAGNOSIS — I509 Heart failure, unspecified: Secondary | ICD-10-CM | POA: Diagnosis present

## 2013-01-04 DIAGNOSIS — D471 Chronic myeloproliferative disease: Secondary | ICD-10-CM

## 2013-01-04 DIAGNOSIS — Z87891 Personal history of nicotine dependence: Secondary | ICD-10-CM

## 2013-01-04 DIAGNOSIS — M109 Gout, unspecified: Secondary | ICD-10-CM | POA: Diagnosis present

## 2013-01-04 DIAGNOSIS — Z8673 Personal history of transient ischemic attack (TIA), and cerebral infarction without residual deficits: Secondary | ICD-10-CM

## 2013-01-04 HISTORY — DX: Benign prostatic hyperplasia without lower urinary tract symptoms: N40.0

## 2013-01-04 LAB — BASIC METABOLIC PANEL
CO2: 27 mEq/L (ref 19–32)
Chloride: 103 mEq/L (ref 96–112)
Glucose, Bld: 114 mg/dL — ABNORMAL HIGH (ref 70–99)
Potassium: 3.5 mEq/L (ref 3.5–5.1)
Sodium: 138 mEq/L (ref 135–145)

## 2013-01-04 LAB — CBC
Hemoglobin: 8.3 g/dL — ABNORMAL LOW (ref 13.0–17.0)
MCH: 28.8 pg (ref 26.0–34.0)
Platelets: 298 10*3/uL (ref 150–400)
RBC: 2.88 MIL/uL — ABNORMAL LOW (ref 4.22–5.81)
WBC: 2.5 10*3/uL — ABNORMAL LOW (ref 4.0–10.5)

## 2013-01-04 LAB — PRO B NATRIURETIC PEPTIDE: Pro B Natriuretic peptide (BNP): 9967 pg/mL — ABNORMAL HIGH (ref 0–450)

## 2013-01-04 LAB — TROPONIN I: Troponin I: <0.3 ng/mL (ref <0.30)

## 2013-01-04 MED ORDER — ENOXAPARIN SODIUM 40 MG/0.4ML ~~LOC~~ SOLN
40.0000 mg | SUBCUTANEOUS | Status: DC
  Administered 2013-01-04 – 2013-01-07 (×4): 40 mg via SUBCUTANEOUS
  Filled 2013-01-04 (×5): qty 0.4

## 2013-01-04 MED ORDER — FUROSEMIDE 10 MG/ML IJ SOLN
80.0000 mg | Freq: Once | INTRAMUSCULAR | Status: AC
  Administered 2013-01-04: 80 mg via INTRAVENOUS
  Filled 2013-01-04: qty 4

## 2013-01-04 MED ORDER — DILTIAZEM HCL ER COATED BEADS 180 MG PO CP24
180.0000 mg | ORAL_CAPSULE | Freq: Every day | ORAL | Status: DC
  Administered 2013-01-04 – 2013-01-08 (×5): 180 mg via ORAL
  Filled 2013-01-04 (×5): qty 1

## 2013-01-04 MED ORDER — POTASSIUM CHLORIDE CRYS ER 20 MEQ PO TBCR
20.0000 meq | EXTENDED_RELEASE_TABLET | Freq: Every day | ORAL | Status: DC
  Administered 2013-01-05 – 2013-01-08 (×4): 20 meq via ORAL
  Filled 2013-01-04 (×4): qty 1

## 2013-01-04 MED ORDER — FUROSEMIDE 10 MG/ML IJ SOLN
80.0000 mg | Freq: Two times a day (BID) | INTRAMUSCULAR | Status: DC
  Administered 2013-01-04: 80 mg via INTRAVENOUS
  Administered 2013-01-05: 10:00:00 via INTRAVENOUS
  Administered 2013-01-05 – 2013-01-07 (×4): 80 mg via INTRAVENOUS
  Filled 2013-01-04 (×8): qty 8

## 2013-01-04 MED ORDER — CARVEDILOL 12.5 MG PO TABS
12.5000 mg | ORAL_TABLET | Freq: Two times a day (BID) | ORAL | Status: DC
  Administered 2013-01-06 – 2013-01-08 (×4): 12.5 mg via ORAL
  Filled 2013-01-04 (×6): qty 1

## 2013-01-04 MED ORDER — ACETAMINOPHEN 325 MG PO TABS: 650.0000 mg | ORAL_TABLET | ORAL | Status: DC | PRN

## 2013-01-04 MED ORDER — SODIUM CHLORIDE 0.9 % IV SOLN: 250.0000 mL | INTRAVENOUS | Status: DC | PRN

## 2013-01-04 MED ORDER — LEVOTHYROXINE SODIUM 100 MCG PO TABS
100.0000 ug | ORAL_TABLET | Freq: Every day | ORAL | Status: DC
  Administered 2013-01-05 – 2013-01-08 (×4): 100 ug via ORAL
  Filled 2013-01-04 (×6): qty 1

## 2013-01-04 MED ORDER — FEBUXOSTAT 40 MG PO TABS
40.0000 mg | ORAL_TABLET | Freq: Every day | ORAL | Status: DC
  Administered 2013-01-04 – 2013-01-08 (×5): 40 mg via ORAL
  Filled 2013-01-04 (×5): qty 1

## 2013-01-04 MED ORDER — INSULIN ASPART 100 UNIT/ML ~~LOC~~ SOLN
0.0000 [IU] | Freq: Three times a day (TID) | SUBCUTANEOUS | Status: DC
  Administered 2013-01-06: 1 [IU] via SUBCUTANEOUS

## 2013-01-04 MED ORDER — ASPIRIN EC 81 MG PO TBEC
81.0000 mg | DELAYED_RELEASE_TABLET | Freq: Every day | ORAL | Status: DC
  Administered 2013-01-05 – 2013-01-08 (×4): 81 mg via ORAL
  Filled 2013-01-04 (×4): qty 1

## 2013-01-04 MED ORDER — SODIUM CHLORIDE 0.9 % IJ SOLN
3.0000 mL | Freq: Two times a day (BID) | INTRAMUSCULAR | Status: DC
  Administered 2013-01-04 – 2013-01-07 (×4): 3 mL via INTRAVENOUS

## 2013-01-04 MED ORDER — SODIUM CHLORIDE 0.9 % IJ SOLN: 3.0000 mL | INTRAMUSCULAR | Status: DC | PRN

## 2013-01-04 MED ORDER — LISINOPRIL 5 MG PO TABS
5.0000 mg | ORAL_TABLET | Freq: Every day | ORAL | Status: DC
  Administered 2013-01-04 – 2013-01-08 (×5): 5 mg via ORAL
  Filled 2013-01-04 (×5): qty 1

## 2013-01-04 MED ORDER — ASPIRIN 81 MG PO TABS: 81.0000 mg | ORAL_TABLET | Freq: Every day | ORAL | Status: DC

## 2013-01-04 MED ORDER — ONDANSETRON HCL 4 MG/2ML IJ SOLN: 4.0000 mg | Freq: Four times a day (QID) | INTRAMUSCULAR | Status: DC | PRN

## 2013-01-04 MED ORDER — ADULT MULTIVITAMIN W/MINERALS CH
1.0000 | ORAL_TABLET | Freq: Every day | ORAL | Status: DC
  Administered 2013-01-05 – 2013-01-08 (×4): 1 via ORAL
  Filled 2013-01-04 (×4): qty 1

## 2013-01-04 NOTE — ED Provider Notes (Signed)
History     CSN: [id number]  Arrival date & time 01/04/13  1327   First MD Initiated Contact with Patient 01/04/13 1456      Chief Complaint  Patient presents with  . Shortness of Breath    (Consider location/radiation/quality/duration/timing/severity/associated sxs/prior treatment) Patient is a 79 y.o. male presenting with shortness of breath.  Shortness of Breath  Pt with history of CHF and chronic anemia got 2 Units PRBC transfusion 2 days ago. He has had increasing SOB since then, worse with exertion and lying flat. Associated with dry cough, but no CP or fever. He was given an extra dose of 20mg  Lasix during the transfusion and takes 40mg  Lasix daily.   Past Medical History  Diagnosis Date  . Diabetes mellitus   . Anemia   . Hypothyroidism   . Hypertension   . Myeloproliferative disorder 06/30/2012  . CVA (cerebral infarction)   . Atrial flutter   . CHF (congestive heart failure)   . Gout     History reviewed. No pertinent past surgical history.  History reviewed. No pertinent family history.  History  Substance Use Topics  . Smoking status: Current Every Day Smoker    Types: Cigars  . Smokeless tobacco: Not on file  . Alcohol Use: No      Review of Systems  Respiratory: Positive for shortness of breath.    All other systems reviewed and are negative except as noted in HPI.   Allergies  Review of patient's allergies indicates no known allergies.  Home Medications   Current Outpatient Rx  Name  Route  Sig  Dispense  Refill  . aspirin 81 MG tablet   Oral   Take 81 mg by mouth daily after breakfast.          . carvedilol (COREG) 25 MG tablet   Oral   Take 1 tablet (25 mg total) by mouth 2 (two) times daily with a meal.   60 tablet   0   . diltiazem (CARDIZEM CD) 360 MG 24 hr capsule   Oral   Take 1 capsule (360 mg total) by mouth daily.   30 capsule   2   . febuxostat (ULORIC) 40 MG tablet   Oral   Take 40 mg by mouth daily.          . furosemide (LASIX) 40 MG tablet   Oral   Take 1 tablet (40 mg total) by mouth daily.   30 tablet   1   . levothyroxine (SYNTHROID, LEVOTHROID) 88 MCG tablet   Oral   Take 88 mcg by mouth daily.         [name] Kitchen losartan (COZAAR) 100 MG tablet   Oral   Take 0.5 tablets (50 mg total) by mouth daily as needed. For swelling   30 tablet   2   . Multiple Vitamin (MULTIVITAMIN) capsule   Oral   Take 1 capsule by mouth daily.         . traMADol (ULTRAM) 50 MG tablet   Oral   Take 1 tablet by mouth every 6 (six) hours as needed.           BP 118/49  Pulse 84  Temp(Src) 98.4 F (36.9 C) (Oral)  Resp 16  SpO2 100%  Physical Exam  Nursing note and vitals reviewed. Constitutional: He is oriented to person, place, and time. He appears well-developed and well-nourished.  HENT:  Head: Normocephalic and atraumatic.  Eyes: EOM are normal. Pupils are equal,  round, and reactive to light.  Neck: Normal range of motion. Neck supple.  Cardiovascular: Normal rate, normal heart sounds and intact distal pulses.   Pulmonary/Chest: Effort normal. He has no wheezes. He has rales (bibasilar).  Abdominal: Bowel sounds are normal. He exhibits no distension. There is no tenderness.  Musculoskeletal: Normal range of motion. He exhibits edema (trace edema). He exhibits no tenderness.  Neurological: He is alert and oriented to person, place, and time. He has normal strength. No cranial nerve deficit or sensory deficit.  Skin: Skin is warm and dry. No rash noted.  Psychiatric: He has a normal mood and affect.    ED Course  Procedures (including critical care time)  Labs Reviewed  CBC - Abnormal; Notable for the following:    WBC 2.5 (*)    RBC 2.88 (*)    Hemoglobin 8.3 (*)    HCT 25.8 (*)    RDW 20.5 (*)    All other components within normal limits  BASIC METABOLIC PANEL - Abnormal; Notable for the following:    Glucose, Bld 114 (*)    GFR calc non Af Amer 63 (*)    GFR calc Af Amer 73  (*)    All other components within normal limits  PRO B NATRIURETIC PEPTIDE - Abnormal; Notable for the following:    Pro B Natriuretic peptide (BNP) 9967.0 (*)    All other components within normal limits  TROPONIN I   Dg Chest 2 View  01/04/2013  *RADIOLOGY REPORT*  Clinical Data: Shortness of breath  CHEST - 2 VIEW  Comparison: 07/04/2012  Findings: Pulmonary vascular congestion, interstitial edema, small to moderate bilateral pleural effusions and bibasilar opacities, likely atelectasis, noted. There is no evidence of pneumothorax. Upper limits normal heart size identified. No acute bony abnormalities are noted.  IMPRESSION: Pulmonary vascular congestion with interstitial edema and small to moderate bilateral pleural effusions.  Bilateral lower lung opacities - likely atelectasis.   Original Report Authenticated By: Harmon Pier, M.D.      No diagnosis found.    MDM   Date: 01/04/2013  Rate: 75  Rhythm: normal sinus rhythm  QRS Axis: normal  Intervals: normal  ST/T Wave abnormalities: nonspecific T wave changes  Conduction Disutrbances:none  Narrative Interpretation:   Old EKG Reviewed: changes noted, no longer in atrial flutter, T wave inversions improved  Pt hypoxic on room air with evidence of fluid overload, likely from recent transfusion. Will give IV Lasix 80mg . Admit for diuresis. CBC and BMP at baseline, BNP increased from previous.           Daielle Melcher B. Bernette Mayers, MD 01/04/13 [phone]

## 2013-01-04 NOTE — H&P (Signed)
[facility] History and Physical  Jeremi Losito ZOX:[id number] DOB: 01/27/1927 DOA: 01/04/2013  Referring physician:  Dr. Bernette Mayers PCP:  Laurena Slimmer, MD  ONC:  Dr. Clelia Croft  Chief Complaint:  Shortness of breath  HPI:  The patient is a 79 y.o. year-old M with grade 1 diastolic heart failure, history of atrial flutter, T2DM, anemia due to myeloproliferative disorder requiring blood transfusions every 2 weeks, hypothyroidism, CVA, and gout who presents with shortness of breath and fatigue.   The patient was last at his baseline a few weeks ago.  He has noticed progressive swelling of his ankles and some increased DOE.  His dyspnea, fatigue and unsteady gait normally precedes his blood transfusions and then improve afterwards, however, when he received his routine transfusion two days ago, his breathing remained "ragged" and his gait remained unsteady.  He did feel a little better after the transfusion, however, not to the same degree as usual.  Over the last two days, he has had increasing shortness of breath with orthopnea requiring him to sit straight up in a chair.  He has not been able to sleep well at night because of PND.  His legs have been more swollen and tight despite receiving lasix with his blood transfusion and taking his daily lasix.  He was previously 190lbs and that is his weight today is recorded again at 190lbs, however, subjectively he has gained weight.  He has had a dry cough that was severe this morning.  He denies fevers, chills, rhinorrhea, sinus congestion.  He has been compliant with his medications, including his lasix, and he typically eats a low salt diet cooked by his daughter.  He has not eaten any new or particularly salty in the last few days.  Denies chest pressure and palpitations.    In the ER, he was found to have a WBC of 2.5 (near baseline), hgb 8.3 (appropriately increased post transfusion).  BMP was wnl.  BNP was 9967 and CXR demonstrated pulmonary vascular  congestion with small to moderate bilateral pleural effusions and bilateral lower lung opacities, "likely atelectasis."  He was hypoxic to 79-80% on RA, but increased to the mid 90s on 2-3 L Iron.  He was given 80mg  of IV lasix and produced approximately of urine.  He is being admitted for further diuresis.    Review of Systems:  Denies fevers, chills.  Denies changes to hearing and vision.  Denies URI symptoms as above.  Denies N/V/D/C, blood in stools, abnl bruising or bleeding, difficulty urinating, dysuria, polyuria, polydipsia.  Denies arthralgias, skin rash or ulcers, focal weakness or numbness.  He has some memory loss and family suggests he has some dementia, although this has not been formally diagnosed.  Denies anxiety and depression.    Past Medical History  Diagnosis Date  . Diabetes mellitus   . Anemia   . Hypothyroidism   . Hypertension   . Myeloproliferative disorder 06/30/2012  . CVA (cerebral infarction)   . Atrial flutter   . CHF (congestive heart failure)   . Gout   . BPH (benign prostatic hypertrophy)    Past Surgical History  Procedure Laterality Date  . Appendectomy    . Thyroid surgery    . Cyst removal neck  2009   Social History:  reports that he has quit smoking. His smoking use included Cigarettes and Cigars. He smoked 0.00 packs per day. His smokeless tobacco use includes Chew. He reports that he does not drink alcohol or use illicit drugs.  Lives in a house with his daughter.  Does not use a cane or walker.   He drives (but is not supposed to).    No Known Allergies  Family History  Problem Relation Age of Onset  . Heart disease Neg Hx   . Heart failure Neg Hx   . Prostate cancer      Prior to Admission medications   Medication Sig Start Date End Date Taking? Authorizing Provider  aspirin 81 MG tablet Take 81 mg by mouth daily after breakfast.    Yes Historical Provider, MD  carvedilol (COREG) 25 MG tablet Take 1 tablet (25 mg total) by mouth 2  (two) times daily with a meal. 07/07/12 07/07/13 Yes Richarda Overlie, MD  diltiazem (CARDIZEM CD) 360 MG 24 hr capsule Take 1 capsule (360 mg total) by mouth daily. 07/07/12 07/07/13 Yes Richarda Overlie, MD  febuxostat (ULORIC) 40 MG tablet Take 40 mg by mouth daily.   Yes Historical Provider, MD  furosemide (LASIX) 40 MG tablet Take 1 tablet (40 mg total) by mouth daily. 07/07/12 07/07/13 Yes Richarda Overlie, MD  levothyroxine (SYNTHROID, LEVOTHROID) 100 MCG tablet Take 100 mcg by mouth daily before breakfast.   Yes Historical Provider, MD  Multiple Vitamin (MULTIVITAMIN WITH MINERALS) TABS Take 1 tablet by mouth daily.   Yes Historical Provider, MD   Physical Exam: Filed Vitals:   01/04/13 1351 01/04/13 1626 01/04/13 1717 01/04/13 1744  BP:  107/50 121/59 104/54  Pulse:  74 69   Temp:   98.4 F (36.9 C)   TempSrc:   Oral   Resp:  20 22   Height:   5\' 9"  (1.753 m)   Weight:   85 kg (187 lb 6.3 oz)   SpO2: 100% 93% 97%      General:  Average to overweight male, no acute distress but mild tachypnea with nasal canula in place  Eyes:  PERRL, anicteric, non-injected.  ENT:  Nares clear, OP clear, non-erythematous without plaques or exudates.  MMM.  Neck:  Supple without TM.  JVP to the tragus with bouncing ears.  Lymph:  Shotty left cervical LN.  No supraclavicular, or submandibular LAD.  Cardiovascular:  RRR, 2/6 systolic murmur at the RUSB, r/g.  2+ pulses, cool extremities.    Respiratory:  Rales to the bilateral apices without rhonchi or wheeze.  No accessory muscle use  Abdomen:  NABS.  Soft, ND/NT.  Skin:  No rashes or focal lesions.  Musculoskeletal:  Normal bulk and tone.  Small volume (3mm depression) pitting edema of the lower extremities, but very slow/firm.    Psychiatric:  Appropriate affect.  Neurologic:  CN 3-12 intact.  5/5 strength.  Sensation intact to light touch, no dysmetria  Labs on Admission:  Basic Metabolic Panel:  Recent Labs Lab 01/04/13 1415  NA 138  K  3.5  CL 103  CO2 27  GLUCOSE 114*  BUN 17  CREATININE 1.06  CALCIUM 8.5   Liver Function Tests: No results found for this basename: AST, ALT, ALKPHOS, BILITOT, PROT, ALBUMIN,  in the last 168 hours No results found for this basename: LIPASE, AMYLASE,  in the last 168 hours No results found for this basename: AMMONIA,  in the last 168 hours CBC:  Recent Labs Lab 01/02/13 0829 01/04/13 1415  WBC 2.8* 2.5*  NEUTROABS 1.7  --   HGB 6.8* 8.3*  HCT 22.3* 25.8*  MCV 91.0 89.6  PLT 319 298   Cardiac Enzymes:  Recent Labs Lab 01/04/13 1415  TROPONINI <0.30    BNP (last 3 results)  Recent Labs  06/29/12 2200 01/04/13 1414  PROBNP 7889.0* 9967.0*   CBG: No results found for this basename: GLUCAP,  in the last 168 hours  Radiological Exams on Admission: Dg Chest 2 View  01/04/2013  *RADIOLOGY REPORT*  Clinical Data: Shortness of breath  CHEST - 2 VIEW  Comparison: 07/04/2012  Findings: Pulmonary vascular congestion, interstitial edema, small to moderate bilateral pleural effusions and bibasilar opacities, likely atelectasis, noted. There is no evidence of pneumothorax. Upper limits normal heart size identified. No acute bony abnormalities are noted.  IMPRESSION: Pulmonary vascular congestion with interstitial edema and small to moderate bilateral pleural effusions.  Bilateral lower lung opacities - likely atelectasis.   Original Report Authenticated By: Harmon Pier, M.D.     EKG:  NSR with borderline 1st degree AV node block.  Diminished voltage.  Flattened T-waves throughout (mildly inverted in III).  Previous ECG demonstrated flutter 06/2012.    Assessment/Plan Principal Problem:   Acute on chronic diastolic heart failure Active Problems:   Anemia   Myeloproliferative disorder   Shortness of breath   Volume overload   Hypothyroidism   Fatigue   Weakness generalized   Acute respiratory failure with hypoxia   Essential hypertension, benign   Gout   CVA (cerebral  infarction)   Acute hypoxic respiratory failure likely due to acute on chronic diastolic heart failure.  Grade 1 diastolic dysfunction with EF 50-55% on ECHO from 06/2012.  At that time his hypervolemia was attributed to his atrial flutter.  May be pulmonary edema secondary to extra volume from blood transfusion a few days ago, however, patient has blood transfusions every 2 weeks which he normally tolerates well.  No URI or viral symptoms.  Denies ACS-like symptoms.  Appears to be complaint with medications and diet and no changes to medications recently.  Concern that his intermittent anemia may be contributing to worsening heart failure.   -   Admit to telemetry -  Cycle troponins -   Daily weights -  Strict I/O -  Goal -1 to 2 liters daily -  Lasix 80mg  IV BID -  Continue beta blocker and CCB for now at half original dose -  Start lisinopril 5mg  daily  -  Repeat ECHO to eval for EF -  Check TSH -  Wean oxygen as tolerated  HTN: BP stable to low -  Continue CCB and BB at half dose  Hx of atrial flutter, sinus rhythm currently:  -  Monitor on telemetry  Hypothyroidism: -  TSH -  Continue synthroid  T2DM, diet controlled. - A1c - Low dose SSI  CVA, no focal deficits but has some mild cognitive impairment  Gout, stable.  Continue uloric  Diet:  Healthy heart, low sodium, 1.2L fluid restriction Access:  PIV IVF:  none Proph:  lovenox  Code Status: full code Family Communication: spoke with patient and his girlfriend and daughter Disposition Plan: admit to telemetry.  Anticipate diuresis over several days  Time spent: 60 min  Ameila Weldon [facility] Pager [phone]  If 7PM-7AM, please contact night-coverage [email or web address] Password [facility] 01/04/2013, 5:59 PM

## 2013-01-04 NOTE — ED Notes (Signed)
Patient is getting SOB with exertion more frequently than usual. Patient has chronic anemia and has Blood transfusion on Friday. The patient has generalized weakness

## 2013-01-04 NOTE — ED Notes (Signed)
Attempted to call report.  Receiving nurse unavailable at this time.

## 2013-01-05 DIAGNOSIS — D649 Anemia, unspecified: Secondary | ICD-10-CM

## 2013-01-05 DIAGNOSIS — E8779 Other fluid overload: Secondary | ICD-10-CM

## 2013-01-05 DIAGNOSIS — I5033 Acute on chronic diastolic (congestive) heart failure: Principal | ICD-10-CM

## 2013-01-05 LAB — BASIC METABOLIC PANEL
BUN: 20 mg/dL (ref 6–23)
Calcium: 8.4 mg/dL (ref 8.4–10.5)
Creatinine, Ser: 1.32 mg/dL (ref 0.50–1.35)
GFR calc Af Amer: 56 mL/min — ABNORMAL LOW (ref >90)
GFR calc non Af Amer: 48 mL/min — ABNORMAL LOW (ref >90)

## 2013-01-05 LAB — GLUCOSE, CAPILLARY
Glucose-Capillary: 157 mg/dL — ABNORMAL HIGH (ref 70–99)
Glucose-Capillary: 91 mg/dL (ref 70–99)
Glucose-Capillary: 111 mg/dL — ABNORMAL HIGH (ref 70–99)
Glucose-Capillary: 87 mg/dL (ref 70–99)

## 2013-01-05 LAB — CBC
HCT: 24.3 % — ABNORMAL LOW (ref 39.0–52.0)
MCHC: 31.3 g/dL (ref 30.0–36.0)
MCV: 92 fL (ref 78.0–100.0)
RDW: 20.8 % — ABNORMAL HIGH (ref 11.5–15.5)

## 2013-01-05 LAB — TSH: TSH: 3.249 u[IU]/mL (ref 0.350–4.500)

## 2013-01-05 NOTE — Evaluation (Signed)
Physical Therapy Evaluation Patient Details Name: Kyle Heath MRN: [id number] DOB: [birth year] Today's Date: 01/05/2013 Time: 1478-2956 PT Time Calculation (min): 26 min  PT Assessment / Plan / Recommendation Clinical Impression  79 yo male admitted with acute on chronic diastolic HF. Hx of anemia, myeloproliferative d/o, frequent blood transfusions, a flutter, mild dementia. Wife present during eval. On eval pt required Min-guard assist for ambulation ~100 feet. On 1st walk, ambulated pt on 2L O2 due to decreased O2 sats at rest. On 2nd walk/stair negotiation, O2 sats dropped to 83 % on RA. Discussed HHPT but pt declined.     PT Assessment  Patient needs continued PT services    Follow Up Recommendations  No PT follow up    Does the patient have the potential to tolerate intense rehabilitation      Barriers to Discharge        Equipment Recommendations  None recommended by PT    Recommendations for Other Services     Frequency Min 3X/week    Precautions / Restrictions Precautions Precautions: None Restrictions Weight Bearing Restrictions: No   Pertinent Vitals/Pain 83% on RA with activity; 92% on 2L O2 at rest after activity      Mobility  Bed Mobility Bed Mobility: Not assessed Details for Bed Mobility Assistance: pt sitting eob Transfers Transfers: Sit to Stand;Stand to Sit Sit to Stand: 5: Supervision;From bed Stand to Sit: 5: Supervision;To bed Ambulation/Gait Ambulation/Gait Assistance: 4: Min guard Ambulation Distance (Feet): 100 Feet Assistive device: None Ambulation/Gait Assistance Details: Dyspnea 3/4. Slow gait speed. Ambulated on 2L O2 initally due to O2 sats 86% on RA at rest sitting EOB.  Gait Pattern: Wide base of support;Step-through pattern Stairs: Yes Stairs Assistance: 4: Min guard Stair Management Technique: Forwards;Step to pattern;One rail Right Number of Stairs: 5    Exercises     PT Diagnosis: Generalized weakness;Difficulty walking   PT Problem List: Decreased activity tolerance;Decreased mobility PT Treatment Interventions: Gait training;Functional mobility training;Therapeutic activities;Therapeutic exercise;Patient/family education   PT Goals Acute Rehab PT Goals PT Goal Formulation: With patient Time For Goal Achievement: 01/19/13 Potential to Achieve Goals: Good Pt will go Supine/Side to Sit: with modified independence PT Goal: Supine/Side to Sit - Progress: Goal set today Pt will go Sit to Stand: with modified independence PT Goal: Sit to Stand - Progress: Goal set today Pt will Ambulate: 51 - 150 feet;with modified independence (with O2 sats >90%) PT Goal: Ambulate - Progress: Goal set today  Visit Information  Last PT Received On: 01/05/13 Assistance Needed: +1    Subjective Data  Subjective: I can walk but I can't walk far Patient Stated Goal: home today   Prior Functioning  Home Living Lives With: Spouse Type of Home: House Home Access: Stairs to enter Secretary/administrator of Steps: 5 Entrance Stairs-Rails: None Home Layout: One level Home Adaptive Equipment: Bedside commode/3-in-1 Prior Function Level of Independence: Needs assistance Needs Assistance: Meal Prep;Light Housekeeping Communication Communication: HOH    Cognition  Cognition Overall Cognitive Status: Appears within functional limits for tasks assessed/performed Arousal/Alertness: Awake/alert Orientation Level: Appears intact for tasks assessed Behavior During Session: [facility] for tasks performed    Extremity/Trunk Assessment Right Lower Extremity Assessment RLE ROM/Strength/Tone: [facility] for tasks assessed Left Lower Extremity Assessment LLE ROM/Strength/Tone: [facility] for tasks assessed Trunk Assessment Trunk Assessment: Normal   Balance    End of Session PT - End of Session Activity Tolerance: Other (comment) (limited by O2 sats, dyspnea) Patient left: in bed;with call bell/phone within reach;with family/visitor  present  GP      [name] [facility] 01/05/2013, 10:49 AM [phone]

## 2013-01-05 NOTE — Plan of Care (Signed)
Problem: Phase II Progression Outcomes Goal: Walk in hall or up in chair TID Outcome: Progressing During PT eval: 83% on RA with activity; 92% on 2L O2 at rest after activity

## 2013-01-05 NOTE — Progress Notes (Signed)
Patient triggered CHF core measure, pamphlet given about CHF to patient and wife.

## 2013-01-05 NOTE — Progress Notes (Signed)
  Echocardiogram 2D Echocardiogram has been performed.  Kyle Heath 01/05/2013, 8:30 AM

## 2013-01-05 NOTE — Evaluation (Signed)
Occupational Therapy Evaluation Patient Details Name: Kyle Heath MRN: [id number] DOB: 04-15-1927 Today's Date: 01/05/2013 Time: 5643-3295 OT Time Calculation (min): 22 min  OT Assessment / Plan / Recommendation Clinical Impression  79 yo male admitted with acute on chronic diastolic HF. Hx of anemia, myeloproliferative d/o, frequent blood transfusions, a flutter, mild dementia. SaO2 as follows on RA : 98% at rest, 87% during ambulation, 85 % following ambulation. O2 re-applied. Sats improved with rest and PLB to 95%. Skilled OT indicated to maximize independence with BADLs to mod I level  inprep for d/c home.    OT Assessment  Patient needs continued OT Services    Follow Up Recommendations  No OT follow up    Barriers to Discharge      Equipment Recommendations  None recommended by OT    Recommendations for Other Services    Frequency  Min 2X/week    Precautions / Restrictions Precautions Precaution Comments: monitor O2 sats.   Pertinent Vitals/Pain Pt denied pain    ADL  Grooming: Supervision/safety Where Assessed - Grooming: Unsupported standing Upper Body Bathing: Set up Where Assessed - Upper Body Bathing: Unsupported standing Lower Body Bathing: Set up Where Assessed - Lower Body Bathing: Supported sit to stand Upper Body Dressing: Set up Where Assessed - Upper Body Dressing: Unsupported sitting Lower Body Dressing: Set up Where Assessed - Lower Body Dressing: Unsupported sit to stand Toilet Transfer: Supervision/safety Toilet Transfer Method: Stand pivot Acupuncturist: Comfort height toilet Toileting - Architect and Hygiene: Supervision/safety Where Assessed - Engineer, mining and Hygiene: Sit to stand from 3-in-1 or toilet Transfers/Ambulation Related to ADLs: Pt ambulated with supervision without the use of and AD. Required max encouragement to participate. Continues to fatigue quickly.    OT Diagnosis: Generalized  weakness  OT Problem List: Decreased activity tolerance;Decreased safety awareness;Decreased cognition;Decreased knowledge of use of DME or AE OT Treatment Interventions: Self-care/ADL training;Therapeutic activities;DME and/or AE instruction;Patient/family education;Energy conservation   OT Goals Acute Rehab OT Goals OT Goal Formulation: With patient/family Time For Goal Achievement: 01/19/13 Potential to Achieve Goals: Good ADL Goals Pt Will Perform Grooming: with modified independence;Standing at sink ADL Goal: Grooming - Progress: Goal set today Pt Will Transfer to Toilet: with modified independence;Ambulation;Regular height toilet;Comfort height toilet ADL Goal: Toilet Transfer - Progress: Goal set today Pt Will Perform Toileting - Clothing Manipulation: with modified independence;Standing;Sitting on 3-in-1 or toilet ADL Goal: Toileting - Clothing Manipulation - Progress: Goal set today Pt Will Perform Toileting - Hygiene: with modified independence;Sit to stand from 3-in-1/toilet ADL Goal: Toileting - Hygiene - Progress: Goal set today Pt Will Perform Tub/Shower Transfer: Shower transfer;with modified independence;Ambulation ADL Goal: Tub/Shower Transfer - Progress: Goal set today Additional ADL Goal #1: Pt will complete all aspects of bathing and dressing including item retrieval at mod I level taking prn RBS with minimal prompting. ADL Goal: Additional Goal #1 - Progress: Goal set today  Visit Information  Last OT Received On: 01/05/13 Assistance Needed: +1    Subjective Data  Subjective: I feel just fine. Patient Stated Goal: Not asked.   Prior Functioning     Home Living Lives With: Spouse Available Help at Discharge: Family Type of Home: House Home Access: Stairs to enter Secretary/administrator of Steps: 5 Entrance Stairs-Rails: None Home Layout: One level Bathroom Shower/Tub: Engineer, manufacturing systems: Standard Home Adaptive Equipment: Shower chair with  back;Bedside commode/3-in-1 Prior Function Meal Prep: Total Light Housekeeping: Total Able to Take Stairs?: Yes Driving: No Vocation: Retired  Communication Communication: HOH Dominant Hand: Right         Vision/Perception     Cognition  Cognition Overall Cognitive Status: Impaired Area of Impairment: Attention;Memory;Safety/judgement;Awareness of errors;Awareness of deficits Arousal/Alertness: Awake/alert Orientation Level: Appears intact for tasks assessed Behavior During Session: [facility] for tasks performed Current Attention Level: Sustained Memory: Decreased recall of precautions Safety/Judgement: Decreased awareness of safety precautions;Decreased safety judgement for tasks assessed;Impulsive;Decreased awareness of need for assistance Awareness of Errors: Assistance required to identify errors made;Assistance required to correct errors made    Extremity/Trunk Assessment Right Upper Extremity Assessment RUE ROM/Strength/Tone: [facility] for tasks assessed Left Upper Extremity Assessment LUE ROM/Strength/Tone: [facility] for tasks assessed     Mobility Transfers Sit to Stand: 5: Supervision Stand to Sit: 5: Supervision     Exercise     Balance     End of Session OT - End of Session Activity Tolerance: Patient limited by fatigue Patient left: in chair;with call bell/phone within reach  GO     Skyanne Welle A OTR/L [phone] 01/05/2013, 3:42 PM

## 2013-01-05 NOTE — Care Management Note (Signed)
    Page 1 of 2   01/08/2013     11:38:52 AM   CARE MANAGEMENT NOTE 01/08/2013  Patient:  Kyle Heath, Kyle Heath   Account Number:  [id number]  Date Initiated:  01/05/2013  Documentation initiated by:  Lanier Clam  Subjective/Objective Assessment:   ADMITTED W/SOB.CHF.     Action/Plan:   FROM HOME W/SPOUSE.HAS PCP,PHARMACY.   Anticipated DC Date:  01/08/2013   Anticipated DC Plan:  HOME W HOME HEALTH SERVICES      DC Planning Services  CM consult  Patient refused services      Choice offered to / List presented to:  C-1 Patient        HH arranged  HH-2 PT  HH-4 NURSE'S AIDE      HH agency  [facility]   Status of service:  Completed, signed off Medicare Important Message given?   (If response is "NO", the following Medicare IM given date fields will be blank) Date Medicare IM given:   Date Additional Medicare IM given:    Discharge Disposition:  HOME W HOME HEALTH SERVICES  Per UR Regulation:  Reviewed for med. necessity/level of care/duration of stay  If discussed at Long Length of Stay Meetings, dates discussed:    Comments:  01/08/13 Gabreal Worton RN,BSN NCM [phone] [facility] CHOSEN FOR HH,SUSAN REP AWARE OF D/C & ORDERS. 01/06/13 Kmari Brian RN,BSN NCM [phone] PATIENT PLEASANTLY DECLINES HHC.WILL ACCEPT HOME 02 IF NEEDED.LEFT HHC AGENCY LIST IN RM FOR HIS DAUGHTER LISA TO KNOW WHAT WAS RECOMMENDED, HHRN-CHF PROTOCAL.THN WILL F/U IN COMMUNITY. 01/05/13 Ilyaas Musto RN,BSN NCM [phone] NOTED PT-NO PT F/U.NOTED 02 SATS W/ACTIVITY,& RECOVERY QUALIFIED FOR HOME 02.WILL NEED 02 SATS @ REST W/O ACTIVITY IN PROGRESS NOTES.

## 2013-01-05 NOTE — Progress Notes (Signed)
[facility] PROGRESS NOTE  Kyle Heath WUJ:[id number] DOB: 12-May-1927 DOA: 01/04/2013 PCP: Laurena Slimmer, MD Brief narrative 79 y.o. year-old M with grade 1 diastolic heart failure, history of atrial flutter, T2DM, anemia due to myeloproliferative disorder requiring blood transfusions every 2 weeks, hypothyroidism, CVA, and gout who presents with shortness of breath and fatigue.   Assessment/plan Acute hypoxic respiratory failure Likely in the setting of acute on chronic diastole dysfunction. Patient placed on IV Lasix 80 mg twice a day with a negative balance of 1100 since admission. -Heart is a breath likely also triggered by ongoing anemia. -Continue Coreg and Cardizem. Added lisinopril admission -Follow 2-D echo results. -Monitor I/O. and daily weight  Anemia Drop in H&H noted any and labs. Will hold off on transfusion at this time given volume overload. Repeat hemoglobin in a.m. and transfuse as necessary  Hypertension Beta blocker and Cardizem started at a lower dose low-normal BP on admission. Continue to monitor blood pressure  History of atrial flutter Patient is in sinus at present continue telemetry monitoring Hypothyroidism Continue Synthroid  Type 2 diabetes mellitus Diet-controlled. Continue SSI  History of CVA No Focal deficit  DVT prophylaxis Diet: Low sodium with fluid restriction   HPI/Subjective: Patient seen and examined this morning. Informs his breathing to be better. Requesting to have his chewing tobacco.  Objective: Filed Vitals:   01/04/13 1744 01/04/13 2306 01/05/13 0502 01/05/13 0950  BP: 104/54 118/48 110/53 113/65  Pulse:  64 72 73  Temp:  98.4 F (36.9 C) 98.8 F (37.1 C)   TempSrc:  Oral Oral   Resp:  18 20   Height:      Weight:   83.1 kg (183 lb 3.2 oz)   SpO2:  98% 99%     Intake/Output Summary (Last 24 hours) at 01/05/13 1222 Last data filed at 01/05/13 0500  Gross per 24 hour  Intake    240 ml  Output   1375 ml   Net  -1135 ml   Filed Weights   01/04/13 1717 01/05/13 0502  Weight: 85 kg (187 lb 6.3 oz) 83.1 kg (183 lb 3.2 oz)    Exam:   General:  Elderly male lying in bed in no acute distress  Cardiovascular: Normal S1 and S2, no murmurs rub or gallop  Respiratory: Left basal crackles present  Abdomen: Soft, nontender, nondistended, bowel sounds present  Extremities: Warm, 1+ pitting edema  CNS: AAO x3  Data Reviewed: Basic Metabolic Panel:  Recent Labs Lab 01/04/13 1415 01/05/13 0513  NA 138 140  K 3.5 3.7  CL 103 104  CO2 27 28  GLUCOSE 114* 157*  BUN 17 20  CREATININE 1.06 1.32  CALCIUM 8.5 8.4  MG  --  1.8   Liver Function Tests: No results found for this basename: AST, ALT, ALKPHOS, BILITOT, PROT, ALBUMIN,  in the last 168 hours No results found for this basename: LIPASE, AMYLASE,  in the last 168 hours No results found for this basename: AMMONIA,  in the last 168 hours CBC:  Recent Labs Lab 01/02/13 0829 01/04/13 1415 01/05/13 0513  WBC 2.8* 2.5* 2.6*  NEUTROABS 1.7  --   --   HGB 6.8* 8.3* 7.6*  HCT 22.3* 25.8* 24.3*  MCV 91.0 89.6 92.0  PLT 319 298 280   Cardiac Enzymes:  Recent Labs Lab 01/04/13 1415 01/04/13 1906 01/04/13 2351 01/05/13 0513  TROPONINI <0.30 <0.30 <0.30 <0.30   BNP (last 3 results)  Recent Labs  06/29/12 2200 01/04/13 1414  PROBNP 7889.0* 9967.0*   CBG:  Recent Labs Lab 01/04/13 1834 01/04/13 2328 01/05/13 0515 01/05/13 0736  GLUCAP 101* 104* 157* 91    Recent Results (from the past 240 hour(s))  TECHNOLOGIST REVIEW     Status: None   Collection Time    01/02/13  8:29 AM      Result Value Range Status   Technologist Review     Final   Value: 2% blasts, rare myelocyte, moderate teardrops and helmets, large and giant platelets     Studies: Dg Chest 2 View  01/04/2013  *RADIOLOGY REPORT*  Clinical Data: Shortness of breath  CHEST - 2 VIEW  Comparison: 07/04/2012  Findings: Pulmonary vascular congestion,  interstitial edema, small to moderate bilateral pleural effusions and bibasilar opacities, likely atelectasis, noted. There is no evidence of pneumothorax. Upper limits normal heart size identified. No acute bony abnormalities are noted.  IMPRESSION: Pulmonary vascular congestion with interstitial edema and small to moderate bilateral pleural effusions.  Bilateral lower lung opacities - likely atelectasis.   Original Report Authenticated By: Harmon Pier, M.D.     Scheduled Meds: . aspirin EC  81 mg Oral Daily  . [START ON 01/06/2013] carvedilol  12.5 mg Oral BID WC  . diltiazem  180 mg Oral Daily  . enoxaparin (LOVENOX) injection  40 mg Subcutaneous Q24H  . febuxostat  40 mg Oral Daily  . furosemide  80 mg Intravenous Q12H  . insulin aspart  0-9 Units Subcutaneous TID WC  . levothyroxine  100 mcg Oral QAC breakfast  . lisinopril  5 mg Oral Daily  . multivitamin with minerals  1 tablet Oral Daily  . potassium chloride  20 mEq Oral Daily  . sodium chloride  3 mL Intravenous Q12H   Continuous Infusions:     Time spent: 25 minutes    [name]  [facility] Pager [phone]. If 8PM-8AM, please contact night-coverage at [email or web address], password [facility] 01/05/2013, 12:22 PM  LOS: 1 day

## 2013-01-05 NOTE — Progress Notes (Signed)
Nutrition Brief Note  Reason for Assessment: Consult for Heart Failure Exacerbation  Body mass index is 27.04 kg/(m^2). Patient meets criteria for Overweight based on current BMI.   80 year old male with grade 1 diastolic heart failure, history of atrial flutter, T2DM, anemia due to myeloproliferative disorder requiring blood transfusions every 2 weeks, hypothyroidism, CVA, and gout who presents with shortness of breath and fatigue.   Pt states his usual body weight is 180 lbs. Pt weighed 183 lbs today. Pt was lethargic and unable to answer most questions at time of visit. Family present in room aided in answering questions and were engaged with diet education. Discussed the benefits of limiting sodium in the diet. Reviewed "Heart Failure Nutrition Therapy" from the Academy of Nutrition and Dietetics. Discussed ways to decrease sodium in the diet and recommended consuming <2000 milligrams of sodium per day. Family said they would give the handout to the pt's daughter whom lives with the pt and cooks for him. RD name and contact information was provided.  Current diet order is Heart healthy, patient is consuming approximately 50-75 % of meals at this time. Labs and medications reviewed.   No nutrition interventions warranted at this time. If nutrition issues arise, please consult RD.   Ian Malkin RD, LDN Inpatient Clinical Dietitian Pager: [phone] After Hours Pager: [phone]

## 2013-01-06 LAB — CBC
MCH: 29.3 pg (ref 26.0–34.0)
Platelets: 271 10*3/uL (ref 150–400)
RBC: 2.63 MIL/uL — ABNORMAL LOW (ref 4.22–5.81)
WBC: 2.6 10*3/uL — ABNORMAL LOW (ref 4.0–10.5)

## 2013-01-06 LAB — GLUCOSE, CAPILLARY
Glucose-Capillary: 126 mg/dL — ABNORMAL HIGH (ref 70–99)
Glucose-Capillary: 98 mg/dL (ref 70–99)

## 2013-01-06 LAB — BASIC METABOLIC PANEL
CO2: 30 mEq/L (ref 19–32)
Calcium: 8.4 mg/dL (ref 8.4–10.5)
Chloride: 102 mEq/L (ref 96–112)
Sodium: 141 mEq/L (ref 135–145)

## 2013-01-06 MED ORDER — NICOTINE 21 MG/24HR TD PT24
21.0000 mg | MEDICATED_PATCH | Freq: Every day | TRANSDERMAL | Status: DC
  Administered 2013-01-06 – 2013-01-07 (×2): 21 mg via TRANSDERMAL
  Filled 2013-01-06 (×3): qty 1

## 2013-01-06 NOTE — Progress Notes (Signed)
Patient evaluated for long-term disease management services with [facility] Care Management Program as a benefit of his [employer]. Spoke with patient at bedside to discuss [facility] Care Management. However, patient deferred this Clinical research associate to call his daughter, Doyle Kunath. Attempted to call [phone] x 2 times (courtesy of friend who called into patien't room), but the person that picked up call said it was the wrong number. Attempted to call the home phone provided in EPic and there was no answer. Therefore, left packet at bedside. Will try to reengage when family present at later time. If unable to reach family, patient will still receive a post discharge transition of care call and be evaluated for monthly home visits for assessments and for education.      Raiford Noble, MSN-Ed, RN,BSN, [facility], [phone]

## 2013-01-06 NOTE — Progress Notes (Signed)
Physical Therapy Treatment Patient Details Name: Kyle Heath MRN: [id number] DOB: 12/01/1926 Today's Date: 01/06/2013 Time: 4098-1191 PT Time Calculation (min): 15 min  PT Assessment / Plan / Recommendation Comments on Treatment Session  pt is not doing as well today with mobility or activity tolerance.  Limited by decreased O2 sats and tachycardia with activity    Follow Up Recommendations  Home health PT     Does the patient have the potential to tolerate intense rehabilitation     Barriers to Discharge        Equipment Recommendations  Rolling walker with 5" wheels    Recommendations for Other Services    Frequency     Plan Discharge plan needs to be updated;Frequency remains appropriate    Precautions / Restrictions     Pertinent Vitals/Pain O2 sats decreased to 85% on RA and HR increased to 127 with minimal activity    Mobility  Bed Mobility Bed Mobility: Not assessed Details for Bed Mobility Assistance: pt sitting on EOB Transfers Transfers: Sit to Stand;Stand to Sit Sit to Stand: 5: Supervision Stand to Sit: 5: Supervision Ambulation/Gait Ambulation/Gait Assistance: 4: Min assist Ambulation Distance (Feet): 50 Feet (in room) Assistive device: Rolling walker Ambulation/Gait Assistance Details: pt needs assist for balance.  O2 reapplied after short distance ambulation as pt with decreased sats and increased HR to 127 accoding to sat monitor with minimal activity Telemetry intact Gait Pattern: Ataxic;Wide base of support;Step-through pattern Gait velocity: decreased General Gait Details: difficulty with ambulation today Stairs: No    Exercises     PT Diagnosis:    PT Problem List:   PT Treatment Interventions:     PT Goals Acute Rehab PT Goals PT Goal Formulation: With patient Time For Goal Achievement: 01/19/13 Potential to Achieve Goals: Good Pt will go Supine/Side to Sit: with modified independence Pt will go Sit to Stand: with modified  independence Pt will Ambulate: 51 - 150 feet;with modified independence (with O2 sats >90%) PT Goal: Ambulate - Progress: Not progressing  Visit Information  Last PT Received On: 01/06/13 Assistance Needed: +1    Subjective Data  Subjective: I can't understand why I am so give out today Patient Stated Goal: to get better   Cognition  Cognition Overall Cognitive Status: Impaired Area of Impairment: Attention;Memory;Safety/judgement;Awareness of errors;Awareness of deficits Arousal/Alertness: Awake/alert Orientation Level: Appears intact for tasks assessed Behavior During Session: [facility] for tasks performed Current Attention Level: Sustained Memory: Decreased recall of precautions Safety/Judgement: Decreased awareness of safety precautions;Decreased safety judgement for tasks assessed;Impulsive;Decreased awareness of need for assistance Awareness of Errors: Assistance required to identify errors made;Assistance required to correct errors made    Balance     End of Session PT - End of Session Activity Tolerance: Treatment limited secondary to medical complications (Comment);Other (comment) (decreaseed O2 sats and increased HR with activity)   GP   Rosey Bath K. Manson Passey, [place] [phone]  01/06/2013, 2:00 PM

## 2013-01-06 NOTE — Progress Notes (Signed)
[facility] PROGRESS NOTE  Kyle Heath ZOX:[id number] DOB: 1926/12/21 DOA: 01/04/2013 PCP: Laurena Slimmer, MD  Brief narrative  79 y.o. year-old M with grade 1 diastolic heart failure, history of atrial flutter, T2DM, anemia due to myeloproliferative disorder requiring blood transfusions every 2 weeks, hypothyroidism, CVA, and gout who presents with shortness of breath and fatigue.   Assessment/plan  Acute hypoxic respiratory failure  Likely in the setting of acute on chronic diastole dysfunction. Patient placed on IV Lasix 80 mg twice a day  -Continue Coreg and Cardizem. Added lisinopril on admission  -2-D echo showed stable DE but grade 2 diastolic dysfunction and increased pulmonary artery pressure -Has been negative by almost 2 L since admission however still has basal crackles and pitting edema -Continue current dose of IV Lasix as patient still needs further diuresis  Anemia due to myeloproliferative disorder -Patient requires blood transfusions every 2 weeks and follows with Dr. Clelia Croft. He had recommended for palliative care and in the past as well given for outcome. -H&H low. Will hold off on transfusion at this time given volume overload. Repeat hemoglobin in a.m. and transfuse as necessary   Hypertension  Beta blocker and Cardizem started at a lower dose low-normal BP on admission. Continue to monitor blood pressure   History of atrial flutter  In sinus rhythm. continue telemetry monitoring   Hypothyroidism  Continue Synthroid   Type 2 diabetes mellitus  Diet-controlled. Continue SSI   History of CVA  No Focal deficit   DVT prophylaxis   Diet: Low sodium with fluid restriction   CODE STATUS: Full code Family communication. No one at bedside today Disposition: Home once shortness of breath improved likely 1-2 days  HPI/Subjective:  Patient seen and examined this morning. Shortness of breath unchanged from yesterday.   Objective: Filed Vitals:    01/06/13 0950 01/06/13 1100 01/06/13 1300 01/06/13 1351  BP:   129/62   Pulse:   81   Temp:   98.9 F (37.2 C)   TempSrc:   Oral   Resp:   22   Height:      Weight:      SpO2: 100% 92% 93% 85%    Intake/Output Summary (Last 24 hours) at 01/06/13 1409 Last data filed at 01/06/13 1200  Gross per 24 hour  Intake   1203 ml  Output   2275 ml  Net  -1072 ml   Filed Weights   01/04/13 1717 01/05/13 0502 01/06/13 0642  Weight: 85 kg (187 lb 6.3 oz) 83.1 kg (183 lb 3.2 oz) 81.9 kg (180 lb 8.9 oz)    Exam:  General: Elderly male lying in bed in no acute distress  Cardiovascular: Normal S1 and S2, no murmurs rub or gallop  Respiratory: Left basal crackles present  Abdomen: Soft, nontender, nondistended, bowel sounds present  Extremities: Warm, 1+ pitting edema  CNS: AAO x3   Data Reviewed: Basic Metabolic Panel:  Recent Labs Lab 01/04/13 1415 01/05/13 0513 01/06/13 0525  NA 138 140 141  K 3.5 3.7 3.8  CL 103 104 102  CO2 27 28 30   GLUCOSE 114* 157* 92  BUN 17 20 21   CREATININE 1.06 1.32 1.17  CALCIUM 8.5 8.4 8.4  MG  --  1.8  --    Liver Function Tests: No results found for this basename: AST, ALT, ALKPHOS, BILITOT, PROT, ALBUMIN,  in the last 168 hours No results found for this basename: LIPASE, AMYLASE,  in the last 168 hours No results found for  this basename: AMMONIA,  in the last 168 hours CBC:  Recent Labs Lab 01/02/13 0829 01/04/13 1415 01/05/13 0513 01/06/13 0525  WBC 2.8* 2.5* 2.6* 2.6*  NEUTROABS 1.7  --   --   --   HGB 6.8* 8.3* 7.6* 7.7*  HCT 22.3* 25.8* 24.3* 24.0*  MCV 91.0 89.6 92.0 91.3  PLT 319 298 280 271   Cardiac Enzymes:  Recent Labs Lab 01/04/13 1415 01/04/13 1906 01/04/13 2351 01/05/13 0513  TROPONINI <0.30 <0.30 <0.30 <0.30   BNP (last 3 results)  Recent Labs  06/29/12 2200 01/04/13 1414  PROBNP 7889.0* 9967.0*   CBG:  Recent Labs Lab 01/05/13 1128 01/05/13 1713 01/05/13 2158 01/06/13 0732 01/06/13 1145   GLUCAP 88 111* 87 93 126*    Recent Results (from the past 240 hour(s))  TECHNOLOGIST REVIEW     Status: None   Collection Time    01/02/13  8:29 AM      Result Value Range Status   Technologist Review     Final   Value: 2% blasts, rare myelocyte, moderate teardrops and helmets, large and giant platelets     Studies: Dg Chest 2 View  01/04/2013  *RADIOLOGY REPORT*  Clinical Data: Shortness of breath  CHEST - 2 VIEW  Comparison: 07/04/2012  Findings: Pulmonary vascular congestion, interstitial edema, small to moderate bilateral pleural effusions and bibasilar opacities, likely atelectasis, noted. There is no evidence of pneumothorax. Upper limits normal heart size identified. No acute bony abnormalities are noted.  IMPRESSION: Pulmonary vascular congestion with interstitial edema and small to moderate bilateral pleural effusions.  Bilateral lower lung opacities - likely atelectasis.   Original Report Authenticated By: Harmon Pier, M.D.     Scheduled Meds: . aspirin EC  81 mg Oral Daily  . carvedilol  12.5 mg Oral BID WC  . diltiazem  180 mg Oral Daily  . enoxaparin (LOVENOX) injection  40 mg Subcutaneous Q24H  . febuxostat  40 mg Oral Daily  . furosemide  80 mg Intravenous Q12H  . insulin aspart  0-9 Units Subcutaneous TID WC  . levothyroxine  100 mcg Oral QAC breakfast  . lisinopril  5 mg Oral Daily  . multivitamin with minerals  1 tablet Oral Daily  . potassium chloride  20 mEq Oral Daily  . sodium chloride  3 mL Intravenous Q12H   Continuous Infusions:   Principal Problem:   Acute on chronic diastolic heart failure Active Problems:   Anemia   Myeloproliferative disorder   Shortness of breath   Volume overload   Hypothyroidism   Fatigue   Weakness generalized   Acute respiratory failure with hypoxia   Essential hypertension, benign   Gout   CVA (cerebral infarction)    Time spent: 25 MINUTES   Eddie North  [facility] Pager [phone] If 8PM-8AM,  please contact night-coverage at [email or web address], password [facility] 01/06/2013, 2:09 PM  LOS: 2 days

## 2013-01-07 ENCOUNTER — Other Ambulatory Visit: Payer: Self-pay | Admitting: Oncology

## 2013-01-07 LAB — CBC
HCT: 25.2 % — ABNORMAL LOW (ref 39.0–52.0)
MCHC: 31.3 g/dL (ref 30.0–36.0)
MCV: 91 fL (ref 78.0–100.0)
Platelets: 278 10*3/uL (ref 150–400)
RDW: 20.3 % — ABNORMAL HIGH (ref 11.5–15.5)
WBC: 2.5 10*3/uL — ABNORMAL LOW (ref 4.0–10.5)

## 2013-01-07 LAB — BASIC METABOLIC PANEL
BUN: 19 mg/dL (ref 6–23)
Chloride: 102 mEq/L (ref 96–112)
Creatinine, Ser: 1.06 mg/dL (ref 0.50–1.35)
GFR calc Af Amer: 73 mL/min — ABNORMAL LOW (ref >90)
GFR calc non Af Amer: 63 mL/min — ABNORMAL LOW (ref >90)

## 2013-01-07 LAB — GLUCOSE, CAPILLARY: Glucose-Capillary: 97 mg/dL (ref 70–99)

## 2013-01-07 MED ORDER — FUROSEMIDE 80 MG PO TABS
80.0000 mg | ORAL_TABLET | Freq: Two times a day (BID) | ORAL | Status: DC
  Administered 2013-01-07 – 2013-01-08 (×2): 80 mg via ORAL
  Filled 2013-01-07 (×4): qty 1

## 2013-01-07 NOTE — Progress Notes (Signed)
Patient ID: Kyle Heath, male   DOB: 08-Aug-1927, 79 y.o.   MRN: [id number] [facility] PROGRESS NOTE  Kyle Heath [id number] DOB: [birth year] DOA: 01/04/2013 PCP: Laurena Slimmer, MD  Brief narrative  79 y.o. year-old M with grade 1 diastolic heart failure, history of atrial flutter, T2DM, anemia due to myeloproliferative disorder requiring blood transfusions every 2 weeks, hypothyroidism, CVA, and gout who presents with shortness of breath and fatigue.   Assessment/plan  Acute hypoxic respiratory failure  - Likely in the setting of acute on chronic diastole dysfunction. Patient placed on IV Lasix 80 mg twice a day  - Continue Coreg and Cardizem. Added lisinopril on admission  - 2-D echo showed stable EF grade 2 diastolic dysfunction and increased pulmonary artery pressure  - Has been negative by almost 2 L since admission  - transition to PO lasix   Anemia due to myeloproliferative disorder  -  Patient requires blood transfusions every 2 weeks, follows with Dr. Clelia Heath.  - H&H low but stable since yesterday. Will hold off on transfusion at this time given volume overload. Hypertension  - Beta blocker and Cardizem started at a lower dose due to low-normal BP on admission. Continue to monitor blood pressure  History of atrial flutter  - In sinus rhythm. continue telemetry monitoring  Hypothyroidism  - Continue Synthroid  Type 2 diabetes mellitus  - Diet-controlled. Continue SSI  History of CVA  - No Focal deficit   Consultants:  None  Procedures/Studies:  2 D ECHO 2/17 --> EF 55%, grade II diastolic dysfunction, moderate pulmonary hypertension   Antibiotics:  None  Code Status: Full Family Communication: Pt at bedside Disposition Plan: Home in 1-2 days  HPI/Subjective: No events overnight.   Objective: Filed Vitals:   01/06/13 2025 01/07/13 0511 01/07/13 0900 01/07/13 1415  BP: 110/45 132/80  133/49  Pulse: 78 80  79  Temp: 98.8 F (37.1 C) 98.3 F (36.8  C)  98.4 F (36.9 C)  TempSrc: Oral Oral  Oral  Resp: 20 20  18   Height:      Weight:  176 lb 2.4 oz (79.9 kg)    SpO2: 93% 93% 93% 96%    Intake/Output Summary (Last 24 hours) at 01/07/13 1519 Last data filed at 01/07/13 1423  Gross per 24 hour  Intake    480 ml  Output   1000 ml  Net   -520 ml    Exam:   General:  Pt is alert, follows commands appropriately, not in acute distress  Cardiovascular: Regular rate and rhythm, S1/S2, no murmurs, no rubs, no gallops  Respiratory: Clear to auscultation bilaterally, no wheezing, no crackles, no rhonchi  Abdomen: Soft, non tender, non distended, bowel sounds present, no guarding  Extremities: +1 LE pitting edema, pulses DP and PT palpable bilaterally  Neuro: Grossly nonfocal  Data Reviewed: Basic Metabolic Panel:  Recent Labs Lab 01/04/13 1415 01/05/13 0513 01/06/13 0525 01/07/13 0500  NA 138 140 141 142  K 3.5 3.7 3.8 3.8  CL 103 104 102 102  CO2 27 28 30 31   GLUCOSE 114* 157* 92 95  BUN 17 20 21 19   CREATININE 1.06 1.32 1.17 1.06  CALCIUM 8.5 8.4 8.4 8.7  MG  --  1.8  --   --    CBC:  Recent Labs Lab 01/02/13 0829 01/04/13 1415 01/05/13 0513 01/06/13 0525 01/07/13 0500  WBC 2.8* 2.5* 2.6* 2.6* 2.5*  NEUTROABS 1.7  --   --   --   --  HGB 6.8* 8.3* 7.6* 7.7* 7.9*  HCT 22.3* 25.8* 24.3* 24.0* 25.2*  MCV 91.0 89.6 92.0 91.3 91.0  PLT 319 298 280 271 278   Cardiac Enzymes:  Recent Labs Lab 01/04/13 1415 01/04/13 1906 01/04/13 2351 01/05/13 0513  TROPONINI <0.30 <0.30 <0.30 <0.30   BNP: No components found with this basename: POCBNP,  CBG:  Recent Labs Lab 01/06/13 1145 01/06/13 1642 01/06/13 2022 01/07/13 0721 01/07/13 1126  GLUCAP 126* 98 98 90 97    Recent Results (from the past 240 hour(s))  TECHNOLOGIST REVIEW     Status: None   Collection Time    01/02/13  8:29 AM      Result Value Range Status   Technologist Review     Final   Value: 2% blasts, rare myelocyte, moderate  teardrops and helmets, large and giant platelets     Scheduled Meds: . aspirin EC  81 mg Oral Daily  . carvedilol  12.5 mg Oral BID WC  . diltiazem  180 mg Oral Daily  . enoxaparin injection  40 mg Subcutaneous Q24H  . febuxostat  40 mg Oral Daily  . furosemide  80 mg Intravenous Q12H  . insulin aspart  0-9 Units Subcutaneous TID WC  . levothyroxine  100 mcg Oral QAC breakfast  . lisinopril  5 mg Oral Daily  . multivitamin  1 tablet Oral Daily  . nicotine  21 mg Transdermal Daily  . potassium chloride  20 mEq Oral Daily   Continuous Infusions:    Debbora Presto, MD  [facility] Pager [phone]  If 7PM-7AM, please contact night-coverage [email or web address] Password [facility] 01/07/2013, 3:19 PM   LOS: 3 days

## 2013-01-07 NOTE — Progress Notes (Signed)
Dr. Izola Price notified of 9 beats VT. Pt asymptomatic during episode.

## 2013-01-07 NOTE — Progress Notes (Signed)
Occupational Therapy Treatment Patient Details Name: Kyle Heath MRN: [id number] DOB: [birth year] Today's Date: 01/07/2013 Time: 4098-1191 OT Time Calculation (min): 18 min  OT Assessment / Plan / Recommendation Comments on Treatment Session Pt improving with mobilty and did maintain O2 sats today.    Follow Up Recommendations  No OT follow up    Barriers to Discharge       Equipment Recommendations  None recommended by OT    Recommendations for Other Services    Frequency Min 2X/week   Plan Discharge plan remains appropriate    Precautions / Restrictions Precautions Precautions: None Precaution Comments: monitor O2 sats. Restrictions Weight Bearing Restrictions: No   Pertinent Vitals/Pain Pt with no c/o pain.  Pt O2 sat level stayed above 90 with several rest breaks during therapy.    ADL  Eating/Feeding: Performed;Independent Where Assessed - Eating/Feeding: Chair Grooming: Supervision/safety Where Assessed - Grooming: Unsupported standing Upper Body Dressing: Performed;Set up Where Assessed - Upper Body Dressing: Unsupported sitting Lower Body Dressing: Set up Where Assessed - Lower Body Dressing: Unsupported sit to stand Toilet Transfer: Supervision/safety Toilet Transfer Method: Stand pivot Toilet Transfer Equipment: Comfort height toilet Toileting - Architect and Hygiene: Supervision/safety Where Assessed - Engineer, mining and Hygiene: Sit to stand from 3-in-1 or toilet Transfers/Ambulation Related to ADLs: Pt with increased participation today.  Pt walked in room with no LOB.  Feel he could be mod I if remains at this level for another day or two. ADL Comments: Pt S with all adls.  Pt does not seem to know his limits when he gets tired and therefore remains S level.    OT Diagnosis:    OT Problem List:   OT Treatment Interventions:     OT Goals Acute Rehab OT Goals OT Goal Formulation: With patient/family Time For Goal  Achievement: 01/19/13 Potential to Achieve Goals: Good ADL Goals Pt Will Perform Grooming: with modified independence;Standing at sink ADL Goal: Grooming - Progress: Progressing toward goals Pt Will Transfer to Toilet: with modified independence;Ambulation;Regular height toilet;Comfort height toilet ADL Goal: Toilet Transfer - Progress: Progressing toward goals Pt Will Perform Toileting - Clothing Manipulation: with modified independence;Standing;Sitting on 3-in-1 or toilet ADL Goal: Toileting - Clothing Manipulation - Progress: Progressing toward goals Pt Will Perform Toileting - Hygiene: with modified independence;Sit to stand from 3-in-1/toilet ADL Goal: Toileting - Hygiene - Progress: Progressing toward goals Pt Will Perform Tub/Shower Transfer: Shower transfer;with modified independence;Ambulation Additional ADL Goal #1: Pt will complete all aspects of bathing and dressing including item retrieval at mod I level taking prn RBS with minimal prompting. ADL Goal: Additional Goal #1 - Progress: Progressing toward goals  Visit Information  Last OT Received On: 01/07/13 Assistance Needed: +1    Subjective Data      Prior Functioning       Cognition  Cognition Overall Cognitive Status: Impaired Area of Impairment: Attention;Memory;Safety/judgement;Awareness of errors;Awareness of deficits Arousal/Alertness: Awake/alert Orientation Level: Appears intact for tasks assessed Behavior During Session: [facility] for tasks performed Current Attention Level: Sustained Memory: Decreased recall of precautions Safety/Judgement: Decreased awareness of safety precautions;Decreased safety judgement for tasks assessed;Impulsive;Decreased awareness of need for assistance Awareness of Errors: Assistance required to identify errors made;Assistance required to correct errors made Cognition - Other Comments: Pt most likely at baseline.    Mobility  Bed Mobility Bed Mobility: Supine to Sit Supine to Sit:  7: Independent Details for Bed Mobility Assistance: pt sitting on EOB Transfers Transfers: Sit to Stand;Stand to Sit Sit to Stand:  5: Supervision Stand to Sit: 5: Supervision Details for Transfer Assistance: safe with transfers and room ambulation today.  Stats stayed about 90 but gave pt lots of rest breaks.    Exercises      Balance     End of Session OT - End of Session Activity Tolerance: Patient limited by fatigue Patient left: in chair;with call bell/phone within reach Nurse Communication: Mobility status  GO     Hope Budds 01/07/2013, 9:52 AM [phone]

## 2013-01-08 LAB — BASIC METABOLIC PANEL
BUN: 19 mg/dL (ref 6–23)
Calcium: 8.7 mg/dL (ref 8.4–10.5)
Creatinine, Ser: 1.09 mg/dL (ref 0.50–1.35)
GFR calc Af Amer: 70 mL/min — ABNORMAL LOW (ref >90)
GFR calc non Af Amer: 61 mL/min — ABNORMAL LOW (ref >90)
Glucose, Bld: 92 mg/dL (ref 70–99)

## 2013-01-08 LAB — CBC
HCT: 24.8 % — ABNORMAL LOW (ref 39.0–52.0)
Hemoglobin: 8 g/dL — ABNORMAL LOW (ref 13.0–17.0)
MCH: 29.9 pg (ref 26.0–34.0)
MCHC: 32.3 g/dL (ref 30.0–36.0)
MCV: 92.5 fL (ref 78.0–100.0)
RDW: 20.9 % — ABNORMAL HIGH (ref 11.5–15.5)

## 2013-01-08 LAB — GLUCOSE, CAPILLARY: Glucose-Capillary: 88 mg/dL (ref 70–99)

## 2013-01-08 MED ORDER — LISINOPRIL 5 MG PO TABS: 5.0000 mg | ORAL_TABLET | Freq: Every day | ORAL | Status: DC

## 2013-01-08 MED ORDER — POTASSIUM CHLORIDE CRYS ER 20 MEQ PO TBCR: 20.0000 meq | EXTENDED_RELEASE_TABLET | Freq: Every day | ORAL | Status: DC

## 2013-01-08 NOTE — Progress Notes (Signed)
Physical Therapy Treatment Patient Details Name: Kyle Heath MRN: [id number] DOB: 12-Feb-1927 Today's Date: 01/08/2013 Time: 1191-4782 PT Time Calculation (min): 8 min  PT Assessment / Plan / Recommendation Comments on Treatment Session  Pt with family upon entering room and report d/c home today.  Pt agreeable to ambulation while monitoring sats and SOB.  Pt doing much better today with activity tolerance and did not require assistive device.    Follow Up Recommendations  Home health PT     Does the patient have the potential to tolerate intense rehabilitation     Barriers to Discharge        Equipment Recommendations  None recommended by PT    Recommendations for Other Services    Frequency     Plan Discharge plan remains appropriate;Frequency remains appropriate    Precautions / Restrictions Precautions Precautions: None   Pertinent Vitals/Pain SaO2 89% room air during ambulation at lowest, usually remained about 93%    Mobility  Transfers Transfers: Sit to Stand;Stand to Sit Sit to Stand: 5: Supervision;With upper extremity assist Stand to Sit: 5: Supervision;With upper extremity assist Details for Transfer Assistance: Educated pt to stand and obtain balance before starting to walk after sitting  Ambulation/Gait Ambulation/Gait Assistance: 5: Supervision Ambulation Distance (Feet): 160 Feet Assistive device: None Ambulation/Gait Assistance Details: no unsteadiness or LOB observed today and pt did not need assistive device, SaO2 at lowest during ambulation on room air 89% Gait Pattern: Wide base of support;Step-through pattern General Gait Details: no denies SOB    Exercises     PT Diagnosis:    PT Problem List:   PT Treatment Interventions:     PT Goals Acute Rehab PT Goals PT Goal: Sit to Stand - Progress: Progressing toward goal PT Goal: Ambulate - Progress: Progressing toward goal  Visit Information  Last PT Received On: 01/08/13 Assistance Needed:  +1    Subjective Data  Subjective: I'm leaving today.  (pt up near window seat and eating [employer] with family)   Cognition  Cognition Overall Cognitive Status: Appears within functional limits for tasks assessed/performed Arousal/Alertness: Awake/alert Behavior During Session: [facility] for tasks performed    Balance     End of Session PT - End of Session Activity Tolerance: Patient tolerated treatment well Patient left: with family/visitor present;in chair   GP     Kyle Heath,Kyle Heath 01/08/2013, 11:03 AM Zenovia Jarred, PT, DPT 01/08/2013 Pager: [phone]

## 2013-01-08 NOTE — Discharge Summary (Signed)
Physician Discharge Summary  Kyle Heath YQM:[id number] DOB: [birth year] DOA: 01/04/2013  PCP: Laurena Slimmer, MD  Admit date: 01/04/2013 Discharge date: 01/08/2013  Recommendations for Outpatient Follow-up:  1. Pt will need to follow up with PCP in 2-3 weeks post discharge 2. Please obtain BMP to evaluate electrolytes and kidney function 3. Please also check CBC to evaluate Hg and Hct levels 4. Please note that discharge weight is 171 lbs  Discharge Diagnoses: Acute on chronic respiratory failure secondary to diastolic CHF exacerbation  Principal Problem:   Acute on chronic diastolic heart failure Active Problems:   Anemia   Myeloproliferative disorder   Shortness of breath   Volume overload   Hypothyroidism   Fatigue   Weakness generalized   Acute respiratory failure with hypoxia   Essential hypertension, benign   Gout   CVA (cerebral infarction)  Discharge Condition: Stable  Diet recommendation: Heart healthy diet discussed in details   Brief narrative  79 y.o. year-old M with grade 1 diastolic heart failure, history of atrial flutter, T2DM, anemia due to myeloproliferative disorder requiring blood transfusions every 2 weeks, hypothyroidism, CVA, and gout who presents with shortness of breath and fatigue.   Assessment/plan  Acute hypoxic respiratory failure  - Likely in the setting of acute on chronic diastole dysfunction. Patient placed on IV Lasix 80 mg twice a day  - Continue Coreg and Cardizem. Added lisinopril on admission  - 2-D echo showed stable EF grade 2 diastolic dysfunction and increased pulmonary artery pressure  - pt will continue to take Lasix PO as per previous home medication regimen  Anemia due to myeloproliferative disorder  - Patient requires blood transfusions every 2 weeks, follows with Dr. Clelia Croft.  - H&H low but stable since yesterday. Will hold off on transfusion at this time given volume overload.  Hypertension  - Beta blocker and Cardizem  started at a lower dose due to low-normal BP on admission.  History of atrial flutter  - In sinus rhythm. No events on telemetry over 48 hours Hypothyroidism  - Continue Synthroid  Type 2 diabetes mellitus  - Diet-controlled. History of CVA  - No Focal deficit   Consultants:  None Procedures/Studies:  2 D ECHO 2/17 --> EF 55%, grade II diastolic dysfunction, moderate pulmonary hypertension  Antibiotics:  None  Code Status: Full  Family Communication: Pt at bedside   Discharge Exam: Filed Vitals:   01/08/13 0652  BP: 119/80  Pulse: 99  Temp: 98.2 F (36.8 C)  Resp: 20   Filed Vitals:   01/07/13 0900 01/07/13 1415 01/07/13 2048 01/08/13 0652  BP:  133/49 126/46 119/80  Pulse:  79 83 99  Temp:  98.4 F (36.9 C) 98.7 F (37.1 C) 98.2 F (36.8 C)  TempSrc:  Oral Oral Oral  Resp:  18 16 20   Height:      Weight:    171 lb 8.3 oz (77.8 kg)  SpO2: 93% 96% 95% 96%    General: Pt is alert, follows commands appropriately, not in acute distress Cardiovascular: Regular rate and rhythm, S1/S2 +, no murmurs, no rubs, no gallops Respiratory: Clear to auscultation bilaterally, no wheezing, no crackles, no rhonchi Abdominal: Soft, non tender, non distended, bowel sounds +, no guarding Extremities: trace bilateral lower extremity pitting edema, no cyanosis, pulses palpable bilaterally DP and PT Neuro: Grossly nonfocal  Discharge Instructions  Discharge Orders   Future Appointments Provider Department Dept Phone   01/16/2013 9:30 AM Dava Najjar [facility] MEDICAL ONCOLOGY [phone]  01/16/2013 10:00 AM Benjiman Core, [facility] [phone]   01/16/2013 11:00 AM [facility] [phone]   Future Orders Complete By Expires     Diet - low sodium heart healthy  As directed     Increase activity slowly  As directed         Medication List    TAKE these medications        aspirin 81 MG tablet  Take 81 mg by mouth daily after breakfast.     carvedilol 25 MG tablet  Commonly known as:  COREG  Take 1 tablet (25 mg total) by mouth 2 (two) times daily with a meal.     diltiazem 360 MG 24 hr capsule  Commonly known as:  CARDIZEM CD  Take 1 capsule (360 mg total) by mouth daily.     furosemide 40 MG tablet  Commonly known as:  LASIX  Take 1 tablet (40 mg total) by mouth daily.     levothyroxine 100 MCG tablet  Commonly known as:  SYNTHROID, LEVOTHROID  Take 100 mcg by mouth daily before breakfast.     lisinopril 5 MG tablet  Commonly known as:  PRINIVIL,ZESTRIL  Take 1 tablet (5 mg total) by mouth daily.     multivitamin with minerals Tabs  Take 1 tablet by mouth daily.     potassium chloride SA 20 MEQ tablet  Commonly known as:  K-DUR,KLOR-CON  Take 1 tablet (20 mEq total) by mouth daily.     ULORIC 40 MG tablet  Generic drug:  febuxostat  Take 40 mg by mouth daily.           Follow-up Information   Follow up with Laurena Slimmer, MD In 2 weeks.   Contact information:   [address], [id number] [place] [address] [phone]        The results of significant diagnostics from this hospitalization (including imaging, microbiology, ancillary and laboratory) are listed below for reference.     Microbiology: Recent Results (from the past 240 hour(s))  TECHNOLOGIST REVIEW     Status: None   Collection Time    01/02/13  8:29 AM      Result Value Range Status   Technologist Review     Final   Value: 2% blasts, rare myelocyte, moderate teardrops and helmets, large and giant platelets     Labs: Basic Metabolic Panel:  Recent Labs Lab 01/04/13 1415 01/05/13 0513 01/06/13 0525 01/07/13 0500 01/08/13 0530  NA 138 140 141 142 142  K 3.5 3.7 3.8 3.8 4.1  CL 103 104 102 102 102  CO2 27 28 30 31 31   GLUCOSE 114* 157* 92 95 92  BUN 17 20 21 19 19   CREATININE 1.06 1.32 1.17 1.06 1.09   CALCIUM 8.5 8.4 8.4 8.7 8.7  MG  --  1.8  --   --   --    CBC:  Recent Labs Lab 01/02/13 0829 01/04/13 1415 01/05/13 0513 01/06/13 0525 01/07/13 0500 01/08/13 0530  WBC 2.8* 2.5* 2.6* 2.6* 2.5* 2.6*  NEUTROABS 1.7  --   --   --   --   --   HGB 6.8* 8.3* 7.6* 7.7* 7.9* 8.0*  HCT 22.3* 25.8* 24.3* 24.0* 25.2* 24.8*  MCV 91.0 89.6 92.0 91.3 91.0 92.5  PLT 319 298 280 271 278 243   Cardiac Enzymes:  Recent Labs Lab 01/04/13 1415 01/04/13 1906 01/04/13  2351 01/05/13 0513  TROPONINI <0.30 <0.30 <0.30 <0.30   BNP: BNP (last 3 results)  Recent Labs  06/29/12 2200 01/04/13 1414  PROBNP 7889.0* 9967.0*   CBG:  Recent Labs Lab 01/07/13 0721 01/07/13 1126 01/07/13 1715 01/07/13 2058 01/08/13 0747  GLUCAP 90 97 118* 107* 88     SIGNED: Time coordinating discharge: Over 30 minutes  Debbora Presto, MD  [facility] 01/08/2013, 9:21 AM Pager [phone]  If 7PM-7AM, please contact night-coverage [email or web address] Password [email or web address]

## 2013-01-08 NOTE — Progress Notes (Signed)
Discharge instructions explained and prescripitons given, daughter verbalizes understanding of same, stable on discharge.

## 2013-01-08 NOTE — Progress Notes (Signed)
Occupational Therapy Treatment Patient Details Name: Kyle Heath MRN: [id number] DOB: 02-15-1927 Today's Date: 01/08/2013 Time: 1010-1017 OT Time Calculation (min): 7 min  OT Assessment / Plan / Recommendation Comments on Treatment Session Pt has DC orders and is planning on leaving for home this morning                   Plan Discharge plan remains appropriate           ADL  Toilet Transfer: Performed;Supervision/safety Toilet Transfer Method: Sit to stand Toilet Transfer Equipment: Comfort height toilet Tub/Shower Transfer: Simulated;Min guard (wife will provide pt A) ADL Comments: Pt educated on safety getting in and ot of tub. Wife states she will A pt. Educated on safety in shower . Pt verbalized understanding      OT Goals ADL Goals ADL Goal: Tub/Shower Transfer - Progress: Progressing toward goals  Visit Information  Last OT Received On: 01/08/13             Mobility  Transfers Transfers: Sit to Stand;Stand to Sit Sit to Stand: 5: Supervision;With upper extremity assist Stand to Sit: 5: Supervision;Without upper extremity assist Details for Transfer Assistance: Educated pt to stand and obtain balance before starting to walk after sitting           End of Session OT - End of Session Activity Tolerance: Patient tolerated treatment well Patient left: in chair  GO     [name], [place] Kung 01/08/2013, 10:49 AM

## 2013-01-16 ENCOUNTER — Telehealth: Payer: Self-pay | Admitting: *Deleted

## 2013-01-16 ENCOUNTER — Ambulatory Visit (HOSPITAL_BASED_OUTPATIENT_CLINIC_OR_DEPARTMENT_OTHER): Payer: Medicare Other | Admitting: Oncology

## 2013-01-16 ENCOUNTER — Ambulatory Visit (HOSPITAL_BASED_OUTPATIENT_CLINIC_OR_DEPARTMENT_OTHER): Payer: Medicare Other

## 2013-01-16 ENCOUNTER — Other Ambulatory Visit (HOSPITAL_BASED_OUTPATIENT_CLINIC_OR_DEPARTMENT_OTHER): Payer: Medicare Other | Admitting: Lab

## 2013-01-16 VITALS — BP 129/40 | HR 80 | Temp 97.2°F | Resp 20 | Ht 69.0 in | Wt 182.4 lb

## 2013-01-16 VITALS — BP 110/41 | HR 58 | Temp 98.0°F | Resp 20

## 2013-01-16 DIAGNOSIS — D649 Anemia, unspecified: Secondary | ICD-10-CM

## 2013-01-16 LAB — CBC WITH DIFFERENTIAL/PLATELET
BASO%: 1.5 % (ref 0.0–2.0)
EOS%: 1.1 % (ref 0.0–7.0)
HCT: 21.1 % — ABNORMAL LOW (ref 38.4–49.9)
LYMPH%: 31.5 % (ref 14.0–49.0)
MCH: 29.8 pg (ref 27.2–33.4)
MCHC: 33.6 g/dL (ref 32.0–36.0)
MONO%: 7.4 % (ref 0.0–14.0)
NEUT%: 58.5 % (ref 39.0–75.0)
Platelets: 231 10*3/uL (ref 140–400)

## 2013-01-16 MED ORDER — DIPHENHYDRAMINE HCL 25 MG PO CAPS
25.0000 mg | ORAL_CAPSULE | Freq: Once | ORAL | Status: AC
  Administered 2013-01-16: 25 mg via ORAL

## 2013-01-16 MED ORDER — ACETAMINOPHEN 325 MG PO TABS
650.0000 mg | ORAL_TABLET | Freq: Once | ORAL | Status: AC
  Administered 2013-01-16: 650 mg via ORAL

## 2013-01-16 MED ORDER — FUROSEMIDE 10 MG/ML IJ SOLN
20.0000 mg | Freq: Once | INTRAMUSCULAR | Status: AC
  Administered 2013-01-16: 15:00:00 via INTRAVENOUS

## 2013-01-16 MED ORDER — SODIUM CHLORIDE 0.9 % IV SOLN
250.0000 mL | Freq: Once | INTRAVENOUS | Status: AC
  Administered 2013-01-16: 250 mL via INTRAVENOUS

## 2013-01-16 NOTE — Patient Instructions (Addendum)
Blood Transfusion   A blood transfusion replaces your blood or some of its parts. Blood is replaced when you have lost blood because of surgery, an accident, or for severe blood conditions like anemia.  You can donate blood to be used on yourself if you have a planned surgery. If you lose blood during that surgery, your own blood can be given back to you.  Any blood given to you is checked to make sure it matches your blood type. Your temperature, blood pressure, and heart rate (vital signs) will be checked often.   GET HELP RIGHT AWAY IF:    You feel sick to your stomach (nauseous) or throw up (vomit).   You have watery poop (diarrhea).   You have shortness of breath or trouble breathing.   You have blood in your pee (urine) or have dark colored pee.   You have chest pain or tightness.   Your eyes or skin turn yellow (jaundice).   You have a temperature by mouth above 102 F (38.9 C), not controlled by medicine.   You start to shake and have chills.   You develop a a red rash (hives) or feel itchy.   You develop lightheadedness or feel confused.   You develop back, joint, or muscle pain.   You do not feel hungry (lost appetite).   You feel tired, restless, or nervous.   You develop belly (abdominal) cramps.  Document Released: 02/01/2009 Document Revised: 01/28/2012 Document Reviewed: 02/01/2009  [employer] Patient Information 2013 [employer]

## 2013-01-16 NOTE — Progress Notes (Signed)
Hematology and Oncology Follow Up Visit  Kyle Heath [id number] [birth year] 79 y.o. 01/16/2013 10:26 AM    Principle Diagnosis: 79 year old gentleman with diagnosis of pancytopenia and more specifically severe anemia likely due to myelofibrosis. He he might have an element of myelodysplastic syndrome as well  Current therapy:  Supportive transfusions every 2-4 weeks as needed  Interim History:  Kyle Heath presents today for a followup visit with his daughter. He is a pleasant 78 year old gentleman who presented with a hemoglobin of 6.1 white cell count of 2.0. His peripheral smear indicated an infiltrative bone marrow process. He did have a bone marrow biopsy performed on 02/11/2012 that showed an extensive fibrosis indicating a myeloproliferative disorder more specifically myelofibrosis. He has been getting Aranesp injections and PRBCs every 2 weeks. He has been discharged the hospital since the last visit due to CHF.He is getting stronger. He can now ambulate without the aid of a walker. Able to toilet himself and dress himself. Fatigue is stable. Denies chest pain, shortness of breath, and dyspnea.  Appetite and weight are stable. No new complications at this time.   Medications: I have reviewed the patient's current medications. Current outpatient prescriptions:aspirin 81 MG tablet, Take 81 mg by mouth daily after breakfast. , Disp: , Rfl: ;  carvedilol (COREG) 25 MG tablet, Take 1 tablet (25 mg total) by mouth 2 (two) times daily with a meal., Disp: 60 tablet, Rfl: 0;  diltiazem (CARDIZEM CD) 360 MG 24 hr capsule, Take 1 capsule (360 mg total) by mouth daily., Disp: 30 capsule, Rfl: 2;  febuxostat (ULORIC) 40 MG tablet, Take 40 mg by mouth daily., Disp: , Rfl:  furosemide (LASIX) 40 MG tablet, Take 1 tablet (40 mg total) by mouth daily., Disp: 30 tablet, Rfl: 1;  levothyroxine (SYNTHROID, LEVOTHROID) 100 MCG tablet, Take 100 mcg by mouth daily before breakfast., Disp: , Rfl: ;  lisinopril  (PRINIVIL,ZESTRIL) 5 MG tablet, Take 1 tablet (5 mg total) by mouth daily., Disp: 30 tablet, Rfl: 1;  Multiple Vitamin (MULTIVITAMIN WITH MINERALS) TABS, Take 1 tablet by mouth daily., Disp: , Rfl:  potassium chloride SA (K-DUR,KLOR-CON) 20 MEQ tablet, Take 1 tablet (20 mEq total) by mouth daily., Disp: 30 tablet, Rfl: 1  Allergies: No Known Allergies  Past Medical History, Surgical history, Social history, and Family History were reviewed and updated.  Review of Systems: Constitutional:  Negative for fever, chills, night sweats,  pain. Cardiovascular: no chest pain or dyspnea on exertion Respiratory: negative Neurological: negative Dermatological: negative ENT: negative Skin: Negative. Gastrointestinal: negative Genito-Urinary: negative Hematological and Lymphatic: negative Breast: negative Musculoskeletal: negative Remaining ROS negative.  Physical Exam: Blood pressure 129/40, pulse 80, temperature 97.2 F (36.2 C), temperature source Oral, resp. rate 20, height 5\' 9"  (1.753 m), weight 182 lb 6.4 oz (82.736 kg). ECOG: 2 General appearance: alert. Pleasantly confused. Head: Normocephalic, without obvious abnormality, atraumatic Neck: no adenopathy, no carotid bruit, no JVD, supple, symmetrical, trachea midline and thyroid not enlarged, symmetric, no tenderness/mass/nodules Lymph nodes: Cervical, supraclavicular, and axillary nodes normal. Heart:regular rate and rhythm, S1, S2 normal, no murmur, click, rub or gallop Lung:chest clear, no wheezing, rales, normal symmetric air entry Abdomen: soft, non-tender, without masses or organomegaly EXT:no erythema, induration, or nodules. 1+ edema bilaterally..   Lab Results:  CBC    Component Value Date/Time   WBC 2.2* 01/16/2013 0952   WBC 2.6* 01/08/2013 0530   RBC 2.38* 01/16/2013 0952   RBC 2.68* 01/08/2013 0530   HGB 7.1* 01/16/2013 0952   HGB  8.0* 01/08/2013 0530   HCT 21.1* 01/16/2013 0952   HCT 24.8* 01/08/2013 0530   PLT 231  01/16/2013 0952   PLT 243 01/08/2013 0530   MCV 88.6 01/16/2013 0952   MCV 92.5 01/08/2013 0530   MCH 29.8 01/16/2013 0952   MCH 29.9 01/08/2013 0530   MCHC 33.6 01/16/2013 0952   MCHC 32.3 01/08/2013 0530   RDW 21.0* 01/16/2013 0952   RDW 20.9* 01/08/2013 0530   LYMPHSABS 0.7* 01/16/2013 0952   MONOABS 0.2 01/16/2013 0952   EOSABS 0.0 01/16/2013 0952   BASOSABS 0.0 01/16/2013 0952    Impression and Plan:  79 year old gentleman with the following issues:  1. 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 He could also have an element of myelodysplastic syndrome as well. He is currently on supportive care only.He does not have abdominal pain or a large spleen. Plan to check CBC every 2 weeks and  transfuse when symptomatic from his anemia. His hemoglobin is 7.1 today. Will given 2 units PRBCs and Aranesp today. His overall prognosis is very poor. I have explained to the patient and his daughter that his prognosis is poor and he is not a candidate for any aggressive treatment.   2. Neutropenia: Stable.  3. Thrombocytopenia : Resolved.   4. HTN: On Cardizem and Cozaar per PCP.  5. DM: Diet-controlled. He is followed by PCP.  6. Followup: Every 2 weeks for lab, injection, and possible transfusion and he will be seen for a visit in 8 weeks.     Kyle Heath 2/28/201410:26 AM

## 2013-01-16 NOTE — Telephone Encounter (Signed)
Per staff message and POF I have scheduled appts.  [name]  

## 2013-01-17 LAB — TYPE AND SCREEN: Unit division: 0

## 2013-01-19 ENCOUNTER — Encounter (HOSPITAL_COMMUNITY)
Admission: RE | Admit: 2013-01-19 | Discharge: 2013-01-19 | Disposition: A | Discharge status: Home / Self Care | Payer: Medicare Other | Source: Ambulatory Visit | Attending: Oncology | Admitting: Oncology

## 2013-01-19 DIAGNOSIS — D649 Anemia, unspecified: Secondary | ICD-10-CM | POA: Insufficient documentation

## 2013-01-21 ENCOUNTER — Emergency Department (HOSPITAL_COMMUNITY)
Admission: EM | Admit: 2013-01-21 | Discharge: 2013-01-21 | Disposition: A | Discharge status: Home / Self Care | Payer: Medicare Other | Attending: Emergency Medicine | Admitting: Emergency Medicine

## 2013-01-21 ENCOUNTER — Other Ambulatory Visit: Payer: Self-pay

## 2013-01-21 ENCOUNTER — Encounter (HOSPITAL_COMMUNITY): Payer: Self-pay | Admitting: *Deleted

## 2013-01-21 DIAGNOSIS — D47Z9 Other specified neoplasms of uncertain behavior of lymphoid, hematopoietic and related tissue: Secondary | ICD-10-CM | POA: Insufficient documentation

## 2013-01-21 DIAGNOSIS — D649 Anemia, unspecified: Secondary | ICD-10-CM | POA: Insufficient documentation

## 2013-01-21 DIAGNOSIS — M109 Gout, unspecified: Secondary | ICD-10-CM | POA: Insufficient documentation

## 2013-01-21 DIAGNOSIS — Z8673 Personal history of transient ischemic attack (TIA), and cerebral infarction without residual deficits: Secondary | ICD-10-CM | POA: Insufficient documentation

## 2013-01-21 DIAGNOSIS — Z7982 Long term (current) use of aspirin: Secondary | ICD-10-CM | POA: Insufficient documentation

## 2013-01-21 DIAGNOSIS — Z87891 Personal history of nicotine dependence: Secondary | ICD-10-CM | POA: Insufficient documentation

## 2013-01-21 DIAGNOSIS — E039 Hypothyroidism, unspecified: Secondary | ICD-10-CM | POA: Insufficient documentation

## 2013-01-21 DIAGNOSIS — I509 Heart failure, unspecified: Secondary | ICD-10-CM | POA: Insufficient documentation

## 2013-01-21 DIAGNOSIS — I4891 Unspecified atrial fibrillation: Secondary | ICD-10-CM | POA: Insufficient documentation

## 2013-01-21 DIAGNOSIS — E119 Type 2 diabetes mellitus without complications: Secondary | ICD-10-CM | POA: Insufficient documentation

## 2013-01-21 DIAGNOSIS — Z79899 Other long term (current) drug therapy: Secondary | ICD-10-CM | POA: Insufficient documentation

## 2013-01-21 DIAGNOSIS — I10 Essential (primary) hypertension: Secondary | ICD-10-CM | POA: Insufficient documentation

## 2013-01-21 DIAGNOSIS — Z87448 Personal history of other diseases of urinary system: Secondary | ICD-10-CM | POA: Insufficient documentation

## 2013-01-21 LAB — BASIC METABOLIC PANEL
BUN: 19 mg/dL (ref 6–23)
CO2: 29 mEq/L (ref 19–32)
Chloride: 101 mEq/L (ref 96–112)
Creatinine, Ser: 1.12 mg/dL (ref 0.50–1.35)
GFR calc Af Amer: 68 mL/min — ABNORMAL LOW (ref >90)
Glucose, Bld: 98 mg/dL (ref 70–99)

## 2013-01-21 LAB — COMPREHENSIVE METABOLIC PANEL
Albumin: 3.2 g/dL — ABNORMAL LOW (ref 3.5–5.2)
BUN: 20 mg/dL (ref 6–23)
Calcium: 8.6 mg/dL (ref 8.4–10.5)
Creatinine, Ser: 1.12 mg/dL (ref 0.50–1.35)
GFR calc Af Amer: 68 mL/min — ABNORMAL LOW (ref >90)
Glucose, Bld: 100 mg/dL — ABNORMAL HIGH (ref 70–99)
Total Protein: 7 g/dL (ref 6.0–8.3)

## 2013-01-21 LAB — CBC
HCT: 27.8 % — ABNORMAL LOW (ref 39.0–52.0)
HCT: 27.4 % — ABNORMAL LOW (ref 39.0–52.0)
Hemoglobin: 9 g/dL — ABNORMAL LOW (ref 13.0–17.0)
Hemoglobin: 9 g/dL — ABNORMAL LOW (ref 13.0–17.0)
MCH: 28.8 pg (ref 26.0–34.0)
MCHC: 32.8 g/dL (ref 30.0–36.0)
MCV: 89.4 fL (ref 78.0–100.0)
MCV: 87.8 fL (ref 78.0–100.0)
RDW: 20 % — ABNORMAL HIGH (ref 11.5–15.5)
RDW: 20.3 % — ABNORMAL HIGH (ref 11.5–15.5)
WBC: 2.3 10*3/uL — ABNORMAL LOW (ref 4.0–10.5)

## 2013-01-21 LAB — POCT I-STAT TROPONIN I: Troponin i, poc: 0 ng/mL (ref 0.00–0.08)

## 2013-01-21 NOTE — ED Notes (Signed)
Pt reports being sent here by home health nurse for new onset of atrial fib. HR 139 at triage, ekg done. Pt denies any cp or sob. No acute distress noted at triage.

## 2013-01-21 NOTE — ED Provider Notes (Addendum)
History     CSN: [id number]  Arrival date & time 01/21/13  1302   First MD Initiated Contact with Patient 01/21/13 1417      Chief Complaint  Patient presents with  . Atrial Fibrillation    (Consider location/radiation/quality/duration/timing/severity/associated sxs/prior treatment) Patient is a 79 y.o. male presenting with atrial fibrillation. The history is provided by the patient and a relative.  Atrial Fibrillation Pertinent negatives include no chest pain, no abdominal pain, no headaches and no shortness of breath.  pt with hx afib, myeloproliferative disorder, diabetes, presents from home. Home health nurse had come for routine visit, and found pt w hr 140. Pt was unaware, states he has felt fine all day. Pt w hx afib. States has been taking his normal meds, denies any recent change in meds. Denies any palpitations or sense of rapid heart beat. No faintness or dizziness. No current or recent chest pain or discomfort. Denies sob or unusual doe. No cough or uri c/o. No fever or chills. Normal appetite. No abd pain. No nvd. No gu c/o.   Past Medical History  Diagnosis Date  . Diabetes mellitus   . Anemia   . Hypothyroidism   . Hypertension   . Myeloproliferative disorder 06/30/2012  . CVA (cerebral infarction)   . Atrial flutter   . CHF (congestive heart failure)   . Gout   . BPH (benign prostatic hypertrophy)     Past Surgical History  Procedure Laterality Date  . Appendectomy    . Thyroid surgery    . Cyst removal neck  2009    Family History  Problem Relation Age of Onset  . Heart disease Neg Hx   . Heart failure Neg Hx   . Prostate cancer      History  Substance Use Topics  . Smoking status: Former Smoker    Types: Cigarettes, Cigars  . Smokeless tobacco: Current User    Types: Chew  . Alcohol Use: No      Review of Systems  Constitutional: Negative for fever and chills.  HENT: Negative for neck pain.   Eyes: Negative for visual disturbance.   Respiratory: Negative for cough and shortness of breath.   Cardiovascular: Negative for chest pain, palpitations and leg swelling.  Gastrointestinal: Negative for abdominal pain.  Genitourinary: Negative for flank pain.  Musculoskeletal: Negative for back pain.  Skin: Negative for rash.  Neurological: Negative for syncope and headaches.  Hematological: Does not bruise/bleed easily.  Psychiatric/Behavioral: Negative for confusion.    Allergies  Review of patient's allergies indicates no known allergies.  Home Medications   Current Outpatient Rx  Name  Route  Sig  Dispense  Refill  . aspirin 81 MG tablet   Oral   Take 81 mg by mouth daily after breakfast.          . carvedilol (COREG) 25 MG tablet   Oral   Take 1 tablet (25 mg total) by mouth 2 (two) times daily with a meal.   60 tablet   0   . diltiazem (CARDIZEM CD) 360 MG 24 hr capsule   Oral   Take 1 capsule (360 mg total) by mouth daily.   30 capsule   2   . febuxostat (ULORIC) 40 MG tablet   Oral   Take 40 mg by mouth daily.         . furosemide (LASIX) 40 MG tablet   Oral   Take 1 tablet (40 mg total) by mouth daily.  30 tablet   1   . levothyroxine (SYNTHROID, LEVOTHROID) 100 MCG tablet   Oral   Take 100 mcg by mouth daily before breakfast.         . lisinopril (PRINIVIL,ZESTRIL) 5 MG tablet   Oral   Take 1 tablet (5 mg total) by mouth daily.   30 tablet   1   . Multiple Vitamin (MULTIVITAMIN WITH MINERALS) TABS   Oral   Take 1 tablet by mouth daily.         . potassium chloride SA (K-DUR,KLOR-CON) 20 MEQ tablet   Oral   Take 1 tablet (20 mEq total) by mouth daily.   30 tablet   1     BP 109/70  Pulse 139  Temp(Src) 98 F (36.7 C) (Oral)  Resp 16  SpO2 97%  Physical Exam  Nursing note and vitals reviewed. Constitutional: He is oriented to person, place, and time. He appears well-developed and well-nourished. No distress.  HENT:  Head: Atraumatic.  Mouth/Throat: Oropharynx  is clear and moist.  Eyes: Conjunctivae are normal. No scleral icterus.  Neck: Neck supple. No tracheal deviation present. No thyromegaly present.  Cardiovascular: Normal rate, normal heart sounds and intact distal pulses.   Irregular rhythm  Pulmonary/Chest: Effort normal. No accessory muscle usage. No respiratory distress.  Abdominal: Soft. Bowel sounds are normal. He exhibits no distension and no mass. There is no tenderness. There is no rebound and no guarding.  Genitourinary:  No cva tenderness  Musculoskeletal: Normal range of motion. He exhibits no tenderness.  Trace bil ankle edema, symmetric.   Neurological: He is alert and oriented to person, place, and time.  Skin: Skin is warm and dry. He is not diaphoretic.  Psychiatric: He has a normal mood and affect.    ED Course  Procedures (including critical care time)   Results for orders placed during the hospital encounter of 01/21/13  CBC      Result Value Range   WBC 2.3 (*) 4.0 - 10.5 K/uL   RBC 3.11 (*) 4.22 - 5.81 MIL/uL   Hemoglobin 9.0 (*) 13.0 - 17.0 g/dL   HCT 21.3 (*) 08.6 - 57.8 %   MCV 89.4  78.0 - 100.0 fL   MCH 28.9  26.0 - 34.0 pg   MCHC 32.4  30.0 - 36.0 g/dL   RDW 46.9 (*) 62.9 - 52.8 %   Platelets 211  150 - 400 K/uL  BASIC METABOLIC PANEL      Result Value Range   Sodium 140  135 - 145 mEq/L   Potassium 3.9  3.5 - 5.1 mEq/L   Chloride 101  96 - 112 mEq/L   CO2 29  19 - 32 mEq/L   Glucose, Bld 98  70 - 99 mg/dL   BUN 19  6 - 23 mg/dL   Creatinine, Ser 4.13  0.50 - 1.35 mg/dL   Calcium 9.0  8.4 - 24.4 mg/dL   GFR calc non Af Amer 59 (*) >90 mL/min   GFR calc Af Amer 68 (*) >90 mL/min  CBC      Result Value Range   WBC 2.3 (*) 4.0 - 10.5 K/uL   RBC 3.12 (*) 4.22 - 5.81 MIL/uL   Hemoglobin 9.0 (*) 13.0 - 17.0 g/dL   HCT 01.0 (*) 27.2 - 53.6 %   MCV 87.8  78.0 - 100.0 fL   MCH 28.8  26.0 - 34.0 pg   MCHC 32.8  30.0 - 36.0 g/dL  RDW 20.3 (*) 11.5 - 15.5 %   Platelets 217  150 - 400 K/uL  POCT  I-STAT TROPONIN I      Result Value Range   Troponin i, poc 0.00  0.00 - 0.08 ng/mL   Comment 3                MDM  Iv ns. Monitor. O2. Labs.  Reviewed nursing notes and prior charts for additional history.   On monitor, rate controlled, hr in 70-80 range. Pt denies any c/o, states feels fine and requests d/c.    Date: 01/21/2013  Rate: 140  Rhythm: atrial fibrillation  QRS Axis: left  Intervals: normal  ST/T Wave abnormalities: nonspecific T wave changes  Conduction Disutrbances:left anterior fascicular block  Narrative Interpretation:   Old EKG Reviewed: changes noted   Recheck hr still better controlled, pt denies any c/o.    Date: 01/21/2013  Rate: 70  Rhythm: atrial flutter  QRS Axis: left  Intervals: normal  ST/T Wave abnormalities: nonspecific T wave changes  Conduction Disutrbances:left anterior fascicular block  Narrative Interpretation:   Old EKG Reviewed: changes noted  Pt remains asymptomatic, hr 78, rr 16, pt requests d/c.   Labs appear c/w baseline. Pt stable for d/c.    Recheck hr 70, pt back in nsr on monitor. Remains asymptomatic.        Suzi Roots, MD 01/21/13 1519  Suzi Roots, MD 01/21/13 [phone]

## 2013-01-30 ENCOUNTER — Ambulatory Visit (HOSPITAL_BASED_OUTPATIENT_CLINIC_OR_DEPARTMENT_OTHER): Payer: Medicare Other

## 2013-01-30 ENCOUNTER — Other Ambulatory Visit (HOSPITAL_BASED_OUTPATIENT_CLINIC_OR_DEPARTMENT_OTHER): Payer: Medicare Other | Admitting: Lab

## 2013-01-30 ENCOUNTER — Other Ambulatory Visit: Payer: Self-pay | Admitting: Oncology

## 2013-01-30 VITALS — BP 102/69 | HR 63 | Temp 98.6°F | Resp 18

## 2013-01-30 LAB — CBC WITH DIFFERENTIAL/PLATELET
Eosinophils Absolute: 0.1 10*3/uL (ref 0.0–0.5)
LYMPH%: 26.9 % (ref 14.0–49.0)
MCHC: 31.1 g/dL — ABNORMAL LOW (ref 32.0–36.0)
MONO#: 0.1 10*3/uL (ref 0.1–0.9)
MONO%: 3.4 % (ref 0.0–14.0)
NEUT#: 1.3 10*3/uL — ABNORMAL LOW (ref 1.5–6.5)
RBC: 2.6 10*6/uL — ABNORMAL LOW (ref 4.20–5.82)
RDW: 21.2 % — ABNORMAL HIGH (ref 11.0–14.6)
WBC: 2.1 10*3/uL — ABNORMAL LOW (ref 4.0–10.3)

## 2013-01-30 MED ORDER — SODIUM CHLORIDE 0.9 % IV SOLN
250.0000 mL | Freq: Once | INTRAVENOUS | Status: AC
  Administered 2013-01-30: 250 mL via INTRAVENOUS

## 2013-01-30 MED ORDER — ACETAMINOPHEN 325 MG PO TABS
650.0000 mg | ORAL_TABLET | Freq: Once | ORAL | Status: AC
  Administered 2013-01-30: 650 mg via ORAL

## 2013-01-30 MED ORDER — DIPHENHYDRAMINE HCL 25 MG PO CAPS
25.0000 mg | ORAL_CAPSULE | Freq: Once | ORAL | Status: AC
  Administered 2013-01-30: 25 mg via ORAL

## 2013-01-30 MED ORDER — FUROSEMIDE 10 MG/ML IJ SOLN: 20.0000 mg | Freq: Once | INTRAMUSCULAR | Status: DC

## 2013-01-30 NOTE — Patient Instructions (Signed)
Blood Transfusion Information WHAT IS A BLOOD TRANSFUSION? A transfusion is the replacement of blood or some of its parts. Blood is made up of multiple cells which provide different functions.  Red blood cells carry oxygen and are used for blood loss replacement.  White blood cells fight against infection.  Platelets control bleeding.  Plasma helps clot blood.  Other blood products are available for specialized needs, such as hemophilia or other clotting disorders. BEFORE THE TRANSFUSION  Who gives blood for transfusions?   You may be able to donate blood to be used at a later date on yourself (autologous donation).  Relatives can be asked to donate blood. This is generally not any safer than if you have received blood from a stranger. The same precautions are taken to ensure safety when a relative's blood is donated.  Healthy volunteers who are fully evaluated to make sure their blood is safe. This is blood bank blood. Transfusion therapy is the safest it has ever been in the practice of medicine. Before blood is taken from a donor, a complete history is taken to make sure that person has no history of diseases nor engages in risky social behavior (examples are intravenous drug use or sexual activity with multiple partners). The donor's travel history is screened to minimize risk of transmitting infections, such as malaria. The donated blood is tested for signs of infectious diseases, such as HIV and hepatitis. The blood is then tested to be sure it is compatible with you in order to minimize the chance of a transfusion reaction. If you or a relative donates blood, this is often done in anticipation of surgery and is not appropriate for emergency situations. It takes many days to process the donated blood. RISKS AND COMPLICATIONS Although transfusion therapy is very safe and saves many lives, the main dangers of transfusion include:   Getting an infectious disease.  Developing a  transfusion reaction. This is an allergic reaction to something in the blood you were given. Every precaution is taken to prevent this. The decision to have a blood transfusion has been considered carefully by your caregiver before blood is given. Blood is not given unless the benefits outweigh the risks. AFTER THE TRANSFUSION  Right after receiving a blood transfusion, you will usually feel much better and more energetic. This is especially true if your red blood cells have gotten low (anemic). The transfusion raises the level of the red blood cells which carry oxygen, and this usually causes an energy increase.  The nurse administering the transfusion will monitor you carefully for complications. HOME CARE INSTRUCTIONS  No special instructions are needed after a transfusion. You may find your energy is better. Speak with your caregiver about any limitations on activity for underlying diseases you may have. SEEK MEDICAL CARE IF:   Your condition is not improving after your transfusion.  You develop redness or irritation at the intravenous (IV) site. SEEK IMMEDIATE MEDICAL CARE IF:  Any of the following symptoms occur over the next 12 hours:  Shaking chills.  You have a temperature by mouth above 102 F (38.9 C), not controlled by medicine.  Chest, back, or muscle pain.  People around you feel you are not acting correctly or are confused.  Shortness of breath or difficulty breathing.  Dizziness and fainting.  You get a rash or develop hives.  You have a decrease in urine output.  Your urine turns a dark color or changes to pink, red, or brown. Any of the following   symptoms occur over the next 10 days:  You have a temperature by mouth above 102 F (38.9 C), not controlled by medicine.  Shortness of breath.  Weakness after normal activity.  The white part of the eye turns yellow (jaundice).  You have a decrease in the amount of urine or are urinating less often.  Your  urine turns a dark color or changes to pink, red, or brown. Document Released: 11/02/2000 Document Revised: 01/28/2012 Document Reviewed: 06/21/2008 Exit[employer] Patient Information 2013 [employer].  

## 2013-01-30 NOTE — Progress Notes (Signed)
1335 - Pt c/o pain at IV site.  NS only running at the time.  Infusion stopped.  Swelling at site approximately 1" round.  IV removed and pressure applied.  All swelling gone within few minutes - gauze and coban for dressing.   1350 - New IV placed.    Pt diastolic BP low throughout infusion of 1st unit of blood.  Lasix held between units per [place] Pare.  BP on completion of 2nd unit stable (see CHL) - reported to [place] by email.  Pt discharged to home with daughter with no complaints.

## 2013-01-31 LAB — TYPE AND SCREEN
ABO/RH(D): O POS
Unit division: 0

## 2013-02-03 ENCOUNTER — Encounter (HOSPITAL_COMMUNITY): Payer: Medicare Other

## 2013-02-05 ENCOUNTER — Other Ambulatory Visit: Payer: Self-pay | Admitting: Oncology

## 2013-02-10 ENCOUNTER — Ambulatory Visit (HOSPITAL_COMMUNITY)
Admission: RE | Admit: 2013-02-10 | Discharge: 2013-02-10 | Disposition: A | Discharge status: Home / Self Care | Payer: Medicare Other | Source: Ambulatory Visit | Attending: Internal Medicine | Admitting: Internal Medicine

## 2013-02-10 DIAGNOSIS — D649 Anemia, unspecified: Secondary | ICD-10-CM | POA: Insufficient documentation

## 2013-02-10 DIAGNOSIS — I10 Essential (primary) hypertension: Secondary | ICD-10-CM | POA: Insufficient documentation

## 2013-02-10 DIAGNOSIS — R0602 Shortness of breath: Secondary | ICD-10-CM | POA: Insufficient documentation

## 2013-02-10 DIAGNOSIS — I5033 Acute on chronic diastolic (congestive) heart failure: Secondary | ICD-10-CM | POA: Insufficient documentation

## 2013-02-10 LAB — BLOOD GAS, ARTERIAL
Drawn by: 24485
Patient temperature: 98.6
pCO2 arterial: 40.9 mmHg (ref 35.0–45.0)
pH, Arterial: 7.401 (ref 7.350–7.450)

## 2013-02-13 ENCOUNTER — Other Ambulatory Visit: Payer: Self-pay | Admitting: Oncology

## 2013-02-13 ENCOUNTER — Ambulatory Visit (HOSPITAL_BASED_OUTPATIENT_CLINIC_OR_DEPARTMENT_OTHER): Payer: Medicare Other

## 2013-02-13 ENCOUNTER — Other Ambulatory Visit (HOSPITAL_BASED_OUTPATIENT_CLINIC_OR_DEPARTMENT_OTHER): Payer: Medicare Other | Admitting: Lab

## 2013-02-13 VITALS — BP 113/66 | HR 56 | Temp 98.0°F | Resp 16

## 2013-02-13 DIAGNOSIS — D649 Anemia, unspecified: Secondary | ICD-10-CM

## 2013-02-13 DIAGNOSIS — D471 Chronic myeloproliferative disease: Secondary | ICD-10-CM

## 2013-02-13 LAB — CBC WITH DIFFERENTIAL/PLATELET
BASO%: 1.9 % (ref 0.0–2.0)
EOS%: 2.8 % (ref 0.0–7.0)
LYMPH%: 34.7 % (ref 14.0–49.0)
MCH: 27.5 pg (ref 27.2–33.4)
MCHC: 31.1 g/dL — ABNORMAL LOW (ref 32.0–36.0)
MONO#: 0.1 10*3/uL (ref 0.1–0.9)
NEUT%: 54.1 % (ref 39.0–75.0)
RBC: 2.84 10*6/uL — ABNORMAL LOW (ref 4.20–5.82)
WBC: 2.2 10*3/uL — ABNORMAL LOW (ref 4.0–10.3)
lymph#: 0.8 10*3/uL — ABNORMAL LOW (ref 0.9–3.3)
nRBC: 0 % (ref 0–0)

## 2013-02-13 LAB — PREPARE RBC (CROSSMATCH)

## 2013-02-13 MED ORDER — DARBEPOETIN ALFA-POLYSORBATE 300 MCG/0.6ML IJ SOLN
300.0000 ug | Freq: Once | INTRAMUSCULAR | Status: AC
  Administered 2013-02-13: 300 ug via SUBCUTANEOUS
  Filled 2013-02-13: qty 0.6

## 2013-02-13 MED ORDER — SODIUM CHLORIDE 0.9 % IV SOLN
250.0000 mL | Freq: Once | INTRAVENOUS | Status: AC
  Administered 2013-02-13: 250 mL via INTRAVENOUS

## 2013-02-13 MED ORDER — DIPHENHYDRAMINE HCL 25 MG PO CAPS
25.0000 mg | ORAL_CAPSULE | Freq: Once | ORAL | Status: AC
  Administered 2013-02-13: 25 mg via ORAL

## 2013-02-13 MED ORDER — FUROSEMIDE 10 MG/ML IJ SOLN: 20.0000 mg | Freq: Once | INTRAMUSCULAR | Status: AC

## 2013-02-13 MED ORDER — ACETAMINOPHEN 325 MG PO TABS
650.0000 mg | ORAL_TABLET | Freq: Once | ORAL | Status: AC
  Administered 2013-02-13: 650 mg via ORAL

## 2013-02-13 NOTE — Patient Instructions (Signed)
Blood Transfusion   A blood transfusion replaces your blood or some of its parts. Blood is replaced when you have lost blood because of surgery, an accident, or for severe blood conditions like anemia.  You can donate blood to be used on yourself if you have a planned surgery. If you lose blood during that surgery, your own blood can be given back to you.  Any blood given to you is checked to make sure it matches your blood type. Your temperature, blood pressure, and heart rate (vital signs) will be checked often.   GET HELP RIGHT AWAY IF:    You feel sick to your stomach (nauseous) or throw up (vomit).   You have watery poop (diarrhea).   You have shortness of breath or trouble breathing.   You have blood in your pee (urine) or have dark colored pee.   You have chest pain or tightness.   Your eyes or skin turn yellow (jaundice).   You have a temperature by mouth above 102 F (38.9 C), not controlled by medicine.   You start to shake and have chills.   You develop a a red rash (hives) or feel itchy.   You develop lightheadedness or feel confused.   You develop back, joint, or muscle pain.   You do not feel hungry (lost appetite).   You feel tired, restless, or nervous.   You develop belly (abdominal) cramps.  Document Released: 02/01/2009 Document Revised: 01/28/2012 Document Reviewed: 02/01/2009  [employer] Patient Information 2013 [employer]

## 2013-02-14 LAB — TYPE AND SCREEN
ABO/RH(D): O POS
Antibody Screen: NEGATIVE
Unit division: 0

## 2013-02-17 ENCOUNTER — Encounter (HOSPITAL_COMMUNITY)
Admission: RE | Admit: 2013-02-17 | Discharge: 2013-02-17 | Disposition: A | Discharge status: Home / Self Care | Payer: Medicare Other | Source: Ambulatory Visit | Attending: Oncology | Admitting: Oncology

## 2013-02-17 DIAGNOSIS — D471 Chronic myeloproliferative disease: Secondary | ICD-10-CM

## 2013-02-17 DIAGNOSIS — D47Z9 Other specified neoplasms of uncertain behavior of lymphoid, hematopoietic and related tissue: Secondary | ICD-10-CM | POA: Insufficient documentation

## 2013-02-27 ENCOUNTER — Ambulatory Visit (HOSPITAL_BASED_OUTPATIENT_CLINIC_OR_DEPARTMENT_OTHER): Payer: Medicare Other

## 2013-02-27 ENCOUNTER — Other Ambulatory Visit: Payer: Self-pay | Admitting: Oncology

## 2013-02-27 ENCOUNTER — Ambulatory Visit (HOSPITAL_BASED_OUTPATIENT_CLINIC_OR_DEPARTMENT_OTHER): Payer: Medicare Other | Admitting: Lab

## 2013-02-27 VITALS — BP 111/62 | HR 58 | Temp 98.3°F | Resp 18

## 2013-02-27 DIAGNOSIS — D471 Chronic myeloproliferative disease: Secondary | ICD-10-CM

## 2013-02-27 DIAGNOSIS — D649 Anemia, unspecified: Secondary | ICD-10-CM

## 2013-02-27 LAB — CBC WITH DIFFERENTIAL/PLATELET
Basophils Absolute: 0 10*3/uL (ref 0.0–0.1)
Eosinophils Absolute: 0 10*3/uL (ref 0.0–0.5)
HCT: 25.5 % — ABNORMAL LOW (ref 38.4–49.9)
HGB: 7.9 g/dL — ABNORMAL LOW (ref 13.0–17.1)
LYMPH%: 32 % (ref 14.0–49.0)
MONO#: 0.1 10*3/uL (ref 0.1–0.9)
NEUT%: 58.1 % (ref 39.0–75.0)
Platelets: 167 10*3/uL (ref 140–400)
WBC: 2.2 10*3/uL — ABNORMAL LOW (ref 4.0–10.3)
lymph#: 0.7 10*3/uL — ABNORMAL LOW (ref 0.9–3.3)

## 2013-02-27 LAB — TECHNOLOGIST REVIEW

## 2013-02-27 MED ORDER — DIPHENHYDRAMINE HCL 25 MG PO CAPS
25.0000 mg | ORAL_CAPSULE | Freq: Once | ORAL | Status: AC
  Administered 2013-02-27: 25 mg via ORAL

## 2013-02-27 MED ORDER — FUROSEMIDE 10 MG/ML IJ SOLN: 20.0000 mg | Freq: Once | INTRAMUSCULAR | Status: DC

## 2013-02-27 MED ORDER — SODIUM CHLORIDE 0.9 % IV SOLN: 250.0000 mL | Freq: Once | INTRAVENOUS | Status: DC

## 2013-02-27 MED ORDER — ACETAMINOPHEN 325 MG PO TABS
650.0000 mg | ORAL_TABLET | Freq: Once | ORAL | Status: AC
  Administered 2013-02-27: 650 mg via ORAL

## 2013-02-27 NOTE — Patient Instructions (Addendum)
Blood Transfusion Information WHAT IS A BLOOD TRANSFUSION? A transfusion is the replacement of blood or some of its parts. Blood is made up of multiple cells which provide different functions.  Red blood cells carry oxygen and are used for blood loss replacement.  White blood cells fight against infection.  Platelets control bleeding.  Plasma helps clot blood.  Other blood products are available for specialized needs, such as hemophilia or other clotting disorders. BEFORE THE TRANSFUSION  Who gives blood for transfusions?   You may be able to donate blood to be used at a later date on yourself (autologous donation).  Relatives can be asked to donate blood. This is generally not any safer than if you have received blood from a stranger. The same precautions are taken to ensure safety when a relative's blood is donated.  Healthy volunteers who are fully evaluated to make sure their blood is safe. This is blood bank blood. Transfusion therapy is the safest it has ever been in the practice of medicine. Before blood is taken from a donor, a complete history is taken to make sure that person has no history of diseases nor engages in risky social behavior (examples are intravenous drug use or sexual activity with multiple partners). The donor's travel history is screened to minimize risk of transmitting infections, such as malaria. The donated blood is tested for signs of infectious diseases, such as HIV and hepatitis. The blood is then tested to be sure it is compatible with you in order to minimize the chance of a transfusion reaction. If you or a relative donates blood, this is often done in anticipation of surgery and is not appropriate for emergency situations. It takes many days to process the donated blood. RISKS AND COMPLICATIONS Although transfusion therapy is very safe and saves many lives, the main dangers of transfusion include:   Getting an infectious disease.  Developing a  transfusion reaction. This is an allergic reaction to something in the blood you were given. Every precaution is taken to prevent this. The decision to have a blood transfusion has been considered carefully by your caregiver before blood is given. Blood is not given unless the benefits outweigh the risks. AFTER THE TRANSFUSION  Right after receiving a blood transfusion, you will usually feel much better and more energetic. This is especially true if your red blood cells have gotten low (anemic). The transfusion raises the level of the red blood cells which carry oxygen, and this usually causes an energy increase.  The nurse administering the transfusion will monitor you carefully for complications. HOME CARE INSTRUCTIONS  No special instructions are needed after a transfusion. You may find your energy is better. Speak with your caregiver about any limitations on activity for underlying diseases you may have. SEEK MEDICAL CARE IF:   Your condition is not improving after your transfusion.  You develop redness or irritation at the intravenous (IV) site. SEEK IMMEDIATE MEDICAL CARE IF:  Any of the following symptoms occur over the next 12 hours:  Shaking chills.  You have a temperature by mouth above 102 F (38.9 C), not controlled by medicine.  Chest, back, or muscle pain.  People around you feel you are not acting correctly or are confused.  Shortness of breath or difficulty breathing.  Dizziness and fainting.  You get a rash or develop hives.  You have a decrease in urine output.  Your urine turns a dark color or changes to pink, red, or brown. Any of the following   symptoms occur over the next 10 days:  You have a temperature by mouth above 102 F (38.9 C), not controlled by medicine.  Shortness of breath.  Weakness after normal activity.  The white part of the eye turns yellow (jaundice).  You have a decrease in the amount of urine or are urinating less often.  Your  urine turns a dark color or changes to pink, red, or brown. Document Released: 11/02/2000 Document Revised: 01/28/2012 Document Reviewed: 06/21/2008 ExitCare Patient Information 2013 [employer].  

## 2013-02-28 LAB — TYPE AND SCREEN
Antibody Screen: NEGATIVE
Unit division: 0

## 2013-03-13 ENCOUNTER — Encounter: Payer: Self-pay | Admitting: Oncology

## 2013-03-13 ENCOUNTER — Ambulatory Visit (HOSPITAL_BASED_OUTPATIENT_CLINIC_OR_DEPARTMENT_OTHER): Payer: Medicare Other | Admitting: Oncology

## 2013-03-13 ENCOUNTER — Other Ambulatory Visit (HOSPITAL_BASED_OUTPATIENT_CLINIC_OR_DEPARTMENT_OTHER): Payer: Medicare Other | Admitting: Lab

## 2013-03-13 ENCOUNTER — Ambulatory Visit (HOSPITAL_BASED_OUTPATIENT_CLINIC_OR_DEPARTMENT_OTHER): Payer: Medicare Other

## 2013-03-13 ENCOUNTER — Telehealth: Payer: Self-pay | Admitting: Oncology

## 2013-03-13 ENCOUNTER — Other Ambulatory Visit: Payer: Self-pay | Admitting: *Deleted

## 2013-03-13 ENCOUNTER — Telehealth: Payer: Self-pay | Admitting: *Deleted

## 2013-03-13 VITALS — BP 120/50 | HR 72 | Temp 98.5°F | Ht 69.0 in | Wt 177.5 lb

## 2013-03-13 VITALS — BP 127/65 | HR 62 | Temp 98.7°F | Resp 18

## 2013-03-13 DIAGNOSIS — D47Z9 Other specified neoplasms of uncertain behavior of lymphoid, hematopoietic and related tissue: Secondary | ICD-10-CM

## 2013-03-13 DIAGNOSIS — D649 Anemia, unspecified: Secondary | ICD-10-CM

## 2013-03-13 DIAGNOSIS — I10 Essential (primary) hypertension: Secondary | ICD-10-CM

## 2013-03-13 DIAGNOSIS — D471 Chronic myeloproliferative disease: Secondary | ICD-10-CM

## 2013-03-13 DIAGNOSIS — D709 Neutropenia, unspecified: Secondary | ICD-10-CM

## 2013-03-13 LAB — CBC WITH DIFFERENTIAL/PLATELET
Basophils Absolute: 0 10*3/uL (ref 0.0–0.1)
Eosinophils Absolute: 0 10*3/uL (ref 0.0–0.5)
HGB: 8 g/dL — ABNORMAL LOW (ref 13.0–17.1)
MCV: 87.4 fL (ref 79.3–98.0)
MONO#: 0.2 10*3/uL (ref 0.1–0.9)
MONO%: 9.1 % (ref 0.0–14.0)
NEUT#: 1.4 10*3/uL — ABNORMAL LOW (ref 1.5–6.5)
RBC: 2.79 10*6/uL — ABNORMAL LOW (ref 4.20–5.82)
RDW: 19 % — ABNORMAL HIGH (ref 11.0–14.6)
WBC: 2.2 10*3/uL — ABNORMAL LOW (ref 4.0–10.3)
lymph#: 0.6 10*3/uL — ABNORMAL LOW (ref 0.9–3.3)

## 2013-03-13 LAB — HOLD TUBE, BLOOD BANK

## 2013-03-13 LAB — PREPARE RBC (CROSSMATCH)

## 2013-03-13 MED ORDER — DARBEPOETIN ALFA-POLYSORBATE 300 MCG/0.6ML IJ SOLN
300.0000 ug | Freq: Once | INTRAMUSCULAR | Status: AC
  Administered 2013-03-13: 300 ug via SUBCUTANEOUS
  Filled 2013-03-13: qty 0.6

## 2013-03-13 MED ORDER — SODIUM CHLORIDE 0.9 % IV SOLN
250.0000 mL | Freq: Once | INTRAVENOUS | Status: AC
  Administered 2013-03-13: 250 mL via INTRAVENOUS

## 2013-03-13 MED ORDER — DIPHENHYDRAMINE HCL 25 MG PO CAPS
25.0000 mg | ORAL_CAPSULE | Freq: Once | ORAL | Status: AC
  Administered 2013-03-13: 25 mg via ORAL

## 2013-03-13 MED ORDER — ACETAMINOPHEN 325 MG PO TABS
650.0000 mg | ORAL_TABLET | Freq: Once | ORAL | Status: AC
  Administered 2013-03-13: 650 mg via ORAL

## 2013-03-13 NOTE — Telephone Encounter (Signed)
gv and printed appt sched and avs for pt for May and June....MW added tx. °

## 2013-03-13 NOTE — Progress Notes (Signed)
Hematology and Oncology Follow Up Visit  Kyle Heath [id number] 06-10-1927 79 y.o. 03/13/2013 10:00 AM    Principle Diagnosis: 79 year old gentleman with diagnosis of pancytopenia and more specifically severe anemia likely due to myelofibrosis. He he might have an element of myelodysplastic syndrome as well  Current therapy:  Supportive transfusions every 2-4 weeks as needed  Interim History:  Kyle Heath presents today for a followup visit with his daughter. He is a pleasant 89 year old gentleman who presented with a hemoglobin of 6.1 white cell count of 2.0. His peripheral smear indicated an infiltrative bone marrow process. He did have a bone marrow biopsy performed on 02/11/2012 that showed an extensive fibrosis indicating a myeloproliferative disorder more specifically myelofibrosis. He has been getting Aranesp injections and PRBCs every 2 weeks. He can now ambulate without the aid of a walker. Able to toilet himself and dress himself. Fatigue is stable. Denies chest pain, shortness of breath, and dyspnea.  Appetite and weight are stable. No new complications at this time.   Medications: I have reviewed the patient's current medications. Current outpatient prescriptions:aspirin 81 MG tablet, Take 81 mg by mouth daily after breakfast. , Disp: , Rfl: ;  carvedilol (COREG) 25 MG tablet, Take 1 tablet (25 mg total) by mouth 2 (two) times daily with a meal., Disp: 60 tablet, Rfl: 0;  diltiazem (CARDIZEM CD) 360 MG 24 hr capsule, Take 1 capsule (360 mg total) by mouth daily., Disp: 30 capsule, Rfl: 2;  febuxostat (ULORIC) 40 MG tablet, Take 40 mg by mouth daily., Disp: , Rfl:  furosemide (LASIX) 40 MG tablet, Take 1 tablet (40 mg total) by mouth daily., Disp: 30 tablet, Rfl: 1;  levothyroxine (SYNTHROID, LEVOTHROID) 100 MCG tablet, Take 100 mcg by mouth daily before breakfast., Disp: , Rfl: ;  lisinopril (PRINIVIL,ZESTRIL) 5 MG tablet, Take 1 tablet (5 mg total) by mouth daily., Disp: 30 tablet, Rfl: 1;   Multiple Vitamin (MULTIVITAMIN WITH MINERALS) TABS, Take 1 tablet by mouth daily., Disp: , Rfl:  potassium chloride SA (K-DUR,KLOR-CON) 20 MEQ tablet, Take 1 tablet (20 mEq total) by mouth daily., Disp: 30 tablet, Rfl: 1 No current facility-administered medications for this visit. Facility-Administered Medications Ordered in Other Visits: furosemide (LASIX) injection 20 mg, 20 mg, Intravenous, Once, Myrtis Ser, NP  Allergies: No Known Allergies  Past Medical History, Surgical history, Social history, and Family History were reviewed and updated.  Review of Systems: Constitutional:  Negative for fever, chills, night sweats,  pain. Cardiovascular: no chest pain or dyspnea on exertion Respiratory: negative Neurological: negative Dermatological: negative ENT: negative Skin: Negative. Gastrointestinal: negative Genito-Urinary: negative Hematological and Lymphatic: negative Breast: negative Musculoskeletal: negative Remaining ROS negative.  Physical Exam: Blood pressure 120/50, pulse 72, temperature 98.5 F (36.9 C), temperature source Oral, height 5\' 9"  (1.753 m), weight 177 lb 8 oz (80.513 kg). ECOG: 2 General appearance: alert. Pleasantly confused. Head: Normocephalic, without obvious abnormality, atraumatic Neck: no adenopathy, no carotid bruit, no JVD, supple, symmetrical, trachea midline and thyroid not enlarged, symmetric, no tenderness/mass/nodules Lymph nodes: Cervical, supraclavicular, and axillary nodes normal. Heart:regular rate and rhythm, S1, S2 normal, no murmur, click, rub or gallop Lung:chest clear, no wheezing, rales, normal symmetric air entry Abdomen: soft, non-tender, without masses or organomegaly EXT:no erythema, induration, or nodules. 1+ edema bilaterally..   Lab Results:  CBC    Component Value Date/Time   WBC 2.2* 03/13/2013 0920   WBC 2.3* 01/21/2013 1431   RBC 2.79* 03/13/2013 0920   RBC 3.12* 01/21/2013 1431  HGB 8.0* 03/13/2013 0920   HGB 9.0*  01/21/2013 1431   HCT 24.4* 03/13/2013 0920   HCT 27.4* 01/21/2013 1431   PLT 146 03/13/2013 0920   PLT 217 01/21/2013 1431   MCV 87.4 03/13/2013 0920   MCV 87.8 01/21/2013 1431   MCH 28.7 03/13/2013 0920   MCH 28.8 01/21/2013 1431   MCHC 32.8 03/13/2013 0920   MCHC 32.8 01/21/2013 1431   RDW 19.0* 03/13/2013 0920   RDW 20.3* 01/21/2013 1431   LYMPHSABS 0.6* 03/13/2013 0920   MONOABS 0.2 03/13/2013 0920   EOSABS 0.0 03/13/2013 0920   BASOSABS 0.0 03/13/2013 0920    Impression and Plan:  79 year old gentleman with the following issues:  1. 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 He could also have an element of myelodysplastic syndrome as well. He is currently on supportive care only.He does not have abdominal pain or a large spleen. Plan to check CBC every 2 weeks and  transfuse when symptomatic from his anemia. His hemoglobin is 7.1 today. Will given 2 units PRBCs and Aranesp today. His overall prognosis is very poor. I have explained to the patient and his daughter that his prognosis is poor and he is not a candidate for any aggressive treatment.   2. Neutropenia: Stable.  3. Thrombocytopenia : Resolved.   4. HTN: On Cardizem and Cozaar per PCP.  5. DM: Diet-controlled. He is followed by PCP.  6. Followup: Every 2 weeks for lab, injection, and possible transfusion and he will be seen for a visit in 8 weeks.     Kataleyah Carducci 4/25/201410:00 AM

## 2013-03-13 NOTE — Telephone Encounter (Signed)
Per staff phone call and POF I have schedueld appts.  [name]  

## 2013-03-13 NOTE — Patient Instructions (Addendum)
Blood Transfusion Information WHAT IS A BLOOD TRANSFUSION? A transfusion is the replacement of blood or some of its parts. Blood is made up of multiple cells which provide different functions.  Red blood cells carry oxygen and are used for blood loss replacement.  White blood cells fight against infection.  Platelets control bleeding.  Plasma helps clot blood.  Other blood products are available for specialized needs, such as hemophilia or other clotting disorders. BEFORE THE TRANSFUSION  Who gives blood for transfusions?   You may be able to donate blood to be used at a later date on yourself (autologous donation).  Relatives can be asked to donate blood. This is generally not any safer than if you have received blood from a stranger. The same precautions are taken to ensure safety when a relative's blood is donated.  Healthy volunteers who are fully evaluated to make sure their blood is safe. This is blood bank blood. Transfusion therapy is the safest it has ever been in the practice of medicine. Before blood is taken from a donor, a complete history is taken to make sure that person has no history of diseases nor engages in risky social behavior (examples are intravenous drug use or sexual activity with multiple partners). The donor's travel history is screened to minimize risk of transmitting infections, such as malaria. The donated blood is tested for signs of infectious diseases, such as HIV and hepatitis. The blood is then tested to be sure it is compatible with you in order to minimize the chance of a transfusion reaction. If you or a relative donates blood, this is often done in anticipation of surgery and is not appropriate for emergency situations. It takes many days to process the donated blood. RISKS AND COMPLICATIONS Although transfusion therapy is very safe and saves many lives, the main dangers of transfusion include:   Getting an infectious disease.  Developing a  transfusion reaction. This is an allergic reaction to something in the blood you were given. Every precaution is taken to prevent this. The decision to have a blood transfusion has been considered carefully by your caregiver before blood is given. Blood is not given unless the benefits outweigh the risks. AFTER THE TRANSFUSION  Right after receiving a blood transfusion, you will usually feel much better and more energetic. This is especially true if your red blood cells have gotten low (anemic). The transfusion raises the level of the red blood cells which carry oxygen, and this usually causes an energy increase.  The nurse administering the transfusion will monitor you carefully for complications. HOME CARE INSTRUCTIONS  No special instructions are needed after a transfusion. You may find your energy is better. Speak with your caregiver about any limitations on activity for underlying diseases you may have. SEEK MEDICAL CARE IF:   Your condition is not improving after your transfusion.  You develop redness or irritation at the intravenous (IV) site. SEEK IMMEDIATE MEDICAL CARE IF:  Any of the following symptoms occur over the next 12 hours:  Shaking chills.  You have a temperature by mouth above 102 F (38.9 C), not controlled by medicine.  Chest, back, or muscle pain.  People around you feel you are not acting correctly or are confused.  Shortness of breath or difficulty breathing.  Dizziness and fainting.  You get a rash or develop hives.  You have a decrease in urine output.  Your urine turns a dark color or changes to pink, red, or brown. Any of the following   symptoms occur over the next 10 days:  You have a temperature by mouth above 102 F (38.9 C), not controlled by medicine.  Shortness of breath.  Weakness after normal activity.  The white part of the eye turns yellow (jaundice).  You have a decrease in the amount of urine or are urinating less often.  Your  urine turns a dark color or changes to pink, red, or brown. Document Released: 11/02/2000 Document Revised: 01/28/2012 Document Reviewed: 06/21/2008 ExitCare Patient Information 2013 [employer].  

## 2013-03-14 LAB — TYPE AND SCREEN
ABO/RH(D): O POS
Antibody Screen: NEGATIVE
Unit division: 0

## 2013-03-19 ENCOUNTER — Encounter (HOSPITAL_COMMUNITY)
Admission: RE | Admit: 2013-03-19 | Discharge: 2013-03-19 | Disposition: A | Discharge status: Home / Self Care | Payer: Medicare Other | Source: Ambulatory Visit | Attending: Oncology | Admitting: Oncology

## 2013-03-19 DIAGNOSIS — D539 Nutritional anemia, unspecified: Secondary | ICD-10-CM | POA: Insufficient documentation

## 2013-03-27 ENCOUNTER — Ambulatory Visit (HOSPITAL_BASED_OUTPATIENT_CLINIC_OR_DEPARTMENT_OTHER): Payer: Medicare Other

## 2013-03-27 ENCOUNTER — Other Ambulatory Visit (HOSPITAL_BASED_OUTPATIENT_CLINIC_OR_DEPARTMENT_OTHER): Payer: Medicare Other | Admitting: Lab

## 2013-03-27 VITALS — BP 135/47 | HR 67 | Temp 97.7°F | Resp 20

## 2013-03-27 DIAGNOSIS — D471 Chronic myeloproliferative disease: Secondary | ICD-10-CM

## 2013-03-27 DIAGNOSIS — D649 Anemia, unspecified: Secondary | ICD-10-CM

## 2013-03-27 LAB — CBC WITH DIFFERENTIAL/PLATELET
BASO%: 1.6 % (ref 0.0–2.0)
EOS%: 1.7 % (ref 0.0–7.0)
HCT: 26.2 % — ABNORMAL LOW (ref 38.4–49.9)
LYMPH%: 30.5 % (ref 14.0–49.0)
MCH: 29 pg (ref 27.2–33.4)
MCHC: 33.1 g/dL (ref 32.0–36.0)
MONO%: 7.9 % (ref 0.0–14.0)
NEUT%: 58.3 % (ref 39.0–75.0)
Platelets: 144 10*3/uL (ref 140–400)
RBC: 2.99 10*6/uL — ABNORMAL LOW (ref 4.20–5.82)

## 2013-03-27 MED ORDER — DARBEPOETIN ALFA-POLYSORBATE 300 MCG/0.6ML IJ SOLN
300.0000 ug | Freq: Once | INTRAMUSCULAR | Status: AC
  Administered 2013-03-27: 300 ug via SUBCUTANEOUS
  Filled 2013-03-27: qty 0.6

## 2013-04-10 ENCOUNTER — Other Ambulatory Visit (HOSPITAL_BASED_OUTPATIENT_CLINIC_OR_DEPARTMENT_OTHER): Payer: Medicare Other | Admitting: Lab

## 2013-04-10 ENCOUNTER — Other Ambulatory Visit: Payer: Self-pay | Admitting: Oncology

## 2013-04-10 ENCOUNTER — Ambulatory Visit (HOSPITAL_BASED_OUTPATIENT_CLINIC_OR_DEPARTMENT_OTHER): Payer: Medicare Other

## 2013-04-10 VITALS — BP 123/47 | HR 73 | Temp 98.4°F | Resp 20

## 2013-04-10 DIAGNOSIS — D471 Chronic myeloproliferative disease: Secondary | ICD-10-CM

## 2013-04-10 DIAGNOSIS — D539 Nutritional anemia, unspecified: Secondary | ICD-10-CM

## 2013-04-10 DIAGNOSIS — D47Z9 Other specified neoplasms of uncertain behavior of lymphoid, hematopoietic and related tissue: Secondary | ICD-10-CM

## 2013-04-10 DIAGNOSIS — D649 Anemia, unspecified: Secondary | ICD-10-CM

## 2013-04-10 LAB — CBC WITH DIFFERENTIAL/PLATELET
Basophils Absolute: 0 10*3/uL (ref 0.0–0.1)
EOS%: 1.3 % (ref 0.0–7.0)
HCT: 20.6 % — ABNORMAL LOW (ref 38.4–49.9)
HGB: 6.8 g/dL — CL (ref 13.0–17.1)
LYMPH%: 28 % (ref 14.0–49.0)
MCH: 29.2 pg (ref 27.2–33.4)
MCV: 88.6 fL (ref 79.3–98.0)
MONO%: 9 % (ref 0.0–14.0)
NEUT%: 60.3 % (ref 39.0–75.0)
RDW: 21.2 % — ABNORMAL HIGH (ref 11.0–14.6)

## 2013-04-10 LAB — PREPARE RBC (CROSSMATCH)

## 2013-04-10 MED ORDER — DIPHENHYDRAMINE HCL 25 MG PO CAPS
25.0000 mg | ORAL_CAPSULE | Freq: Once | ORAL | Status: AC
  Administered 2013-04-10: 25 mg via ORAL

## 2013-04-10 MED ORDER — SODIUM CHLORIDE 0.9 % IV SOLN
250.0000 mL | Freq: Once | INTRAVENOUS | Status: AC
  Administered 2013-04-10: 250 mL via INTRAVENOUS

## 2013-04-10 MED ORDER — ACETAMINOPHEN 325 MG PO TABS
650.0000 mg | ORAL_TABLET | Freq: Once | ORAL | Status: AC
  Administered 2013-04-10: 650 mg via ORAL

## 2013-04-10 NOTE — Patient Instructions (Signed)
Blood Transfusion  A blood transfusion replaces your blood or some of its parts. Blood is replaced when you have lost blood because of surgery, an accident, or for severe blood conditions like anemia. You can donate blood to be used on yourself if you have a planned surgery. If you lose blood during that surgery, your own blood can be given back to you. Any blood given to you is checked to make sure it matches your blood type. Your temperature, blood pressure, and heart rate (vital signs) will be checked often.  GET HELP RIGHT AWAY IF:   You feel sick to your stomach (nauseous) or throw up (vomit).  You have watery poop (diarrhea).  You have shortness of breath or trouble breathing.  You have blood in your pee (urine) or have dark colored pee.  You have chest pain or tightness.  Your eyes or skin turn yellow (jaundice).  You have a temperature by mouth above 102 F (38.9 C), not controlled by medicine.  You start to shake and have chills.  You develop a a red rash (hives) or feel itchy.  You develop lightheadedness or feel confused.  You develop back, joint, or muscle pain.  You do not feel hungry (lost appetite).  You feel tired, restless, or nervous.  You develop belly (abdominal) cramps. Document Released: 02/01/2009 Document Revised: 01/28/2012 Document Reviewed: 02/01/2009 [employer] Patient Information 2014 [employer].  

## 2013-04-11 LAB — TYPE AND SCREEN
ABO/RH(D): O POS
Antibody Screen: NEGATIVE
Unit division: 0

## 2013-04-21 ENCOUNTER — Encounter (HOSPITAL_COMMUNITY)
Admission: RE | Admit: 2013-04-21 | Discharge: 2013-04-21 | Disposition: A | Discharge status: Home / Self Care | Payer: Medicare Other | Source: Ambulatory Visit | Attending: Oncology | Admitting: Oncology

## 2013-04-21 DIAGNOSIS — D539 Nutritional anemia, unspecified: Secondary | ICD-10-CM

## 2013-04-21 DIAGNOSIS — D649 Anemia, unspecified: Secondary | ICD-10-CM | POA: Insufficient documentation

## 2013-04-24 ENCOUNTER — Ambulatory Visit (HOSPITAL_BASED_OUTPATIENT_CLINIC_OR_DEPARTMENT_OTHER): Payer: Medicare Other

## 2013-04-24 ENCOUNTER — Other Ambulatory Visit: Payer: Self-pay | Admitting: Oncology

## 2013-04-24 ENCOUNTER — Other Ambulatory Visit (HOSPITAL_BASED_OUTPATIENT_CLINIC_OR_DEPARTMENT_OTHER): Payer: Medicare Other | Admitting: Lab

## 2013-04-24 VITALS — BP 110/51 | HR 56 | Temp 97.5°F | Resp 20

## 2013-04-24 DIAGNOSIS — D649 Anemia, unspecified: Secondary | ICD-10-CM

## 2013-04-24 DIAGNOSIS — D539 Nutritional anemia, unspecified: Secondary | ICD-10-CM

## 2013-04-24 DIAGNOSIS — D471 Chronic myeloproliferative disease: Secondary | ICD-10-CM

## 2013-04-24 LAB — CBC WITH DIFFERENTIAL/PLATELET
BASO%: 1 % (ref 0.0–2.0)
Eosinophils Absolute: 0 10*3/uL (ref 0.0–0.5)
LYMPH%: 24.7 % (ref 14.0–49.0)
MCHC: 34.2 g/dL (ref 32.0–36.0)
MONO#: 0.2 10*3/uL (ref 0.1–0.9)
NEUT#: 1.5 10*3/uL (ref 1.5–6.5)
Platelets: 188 10*3/uL (ref 140–400)
RBC: 2.57 10*6/uL — ABNORMAL LOW (ref 4.20–5.82)
WBC: 2.3 10*3/uL — ABNORMAL LOW (ref 4.0–10.3)
lymph#: 0.6 10*3/uL — ABNORMAL LOW (ref 0.9–3.3)

## 2013-04-24 LAB — HOLD TUBE, BLOOD BANK

## 2013-04-24 LAB — TECHNOLOGIST REVIEW: Technologist Review: 3

## 2013-04-24 MED ORDER — SODIUM CHLORIDE 0.9 % IV SOLN
250.0000 mL | Freq: Once | INTRAVENOUS | Status: AC
  Administered 2013-04-24: 250 mL via INTRAVENOUS

## 2013-04-24 MED ORDER — DIPHENHYDRAMINE HCL 25 MG PO CAPS
25.0000 mg | ORAL_CAPSULE | Freq: Once | ORAL | Status: AC
  Administered 2013-04-24: 25 mg via ORAL

## 2013-04-24 MED ORDER — ACETAMINOPHEN 325 MG PO TABS
650.0000 mg | ORAL_TABLET | Freq: Once | ORAL | Status: AC
  Administered 2013-04-24: 650 mg via ORAL

## 2013-04-24 NOTE — Patient Instructions (Signed)
Blood Transfusion Information WHAT IS A BLOOD TRANSFUSION? A transfusion is the replacement of blood or some of its parts. Blood is made up of multiple cells which provide different functions.  Red blood cells carry oxygen and are used for blood loss replacement.  White blood cells fight against infection.  Platelets control bleeding.  Plasma helps clot blood.  Other blood products are available for specialized needs, such as hemophilia or other clotting disorders. BEFORE THE TRANSFUSION  Who gives blood for transfusions?   You may be able to donate blood to be used at a later date on yourself (autologous donation).  Relatives can be asked to donate blood. This is generally not any safer than if you have received blood from a stranger. The same precautions are taken to ensure safety when a relative's blood is donated.  Healthy volunteers who are fully evaluated to make sure their blood is safe. This is blood bank blood. Transfusion therapy is the safest it has ever been in the practice of medicine. Before blood is taken from a donor, a complete history is taken to make sure that person has no history of diseases nor engages in risky social behavior (examples are intravenous drug use or sexual activity with multiple partners). The donor's travel history is screened to minimize risk of transmitting infections, such as malaria. The donated blood is tested for signs of infectious diseases, such as HIV and hepatitis. The blood is then tested to be sure it is compatible with you in order to minimize the chance of a transfusion reaction. If you or a relative donates blood, this is often done in anticipation of surgery and is not appropriate for emergency situations. It takes many days to process the donated blood. RISKS AND COMPLICATIONS Although transfusion therapy is very safe and saves many lives, the main dangers of transfusion include:   Getting an infectious disease.  Developing a  transfusion reaction. This is an allergic reaction to something in the blood you were given. Every precaution is taken to prevent this. The decision to have a blood transfusion has been considered carefully by your caregiver before blood is given. Blood is not given unless the benefits outweigh the risks. AFTER THE TRANSFUSION  Right after receiving a blood transfusion, you will usually feel much better and more energetic. This is especially true if your red blood cells have gotten low (anemic). The transfusion raises the level of the red blood cells which carry oxygen, and this usually causes an energy increase.  The nurse administering the transfusion will monitor you carefully for complications. HOME CARE INSTRUCTIONS  No special instructions are needed after a transfusion. You may find your energy is better. Speak with your caregiver about any limitations on activity for underlying diseases you may have. SEEK MEDICAL CARE IF:   Your condition is not improving after your transfusion.  You develop redness or irritation at the intravenous (IV) site. SEEK IMMEDIATE MEDICAL CARE IF:  Any of the following symptoms occur over the next 12 hours:  Shaking chills.  You have a temperature by mouth above 102 F (38.9 C), not controlled by medicine.  Chest, back, or muscle pain.  People around you feel you are not acting correctly or are confused.  Shortness of breath or difficulty breathing.  Dizziness and fainting.  You get a rash or develop hives.  You have a decrease in urine output.  Your urine turns a dark color or changes to pink, red, or brown. Any of the following   symptoms occur over the next 10 days:  You have a temperature by mouth above 102 F (38.9 C), not controlled by medicine.  Shortness of breath.  Weakness after normal activity.  The white part of the eye turns yellow (jaundice).  You have a decrease in the amount of urine or are urinating less often.  Your  urine turns a dark color or changes to pink, red, or brown. Document Released: 11/02/2000 Document Revised: 01/28/2012 Document Reviewed: 06/21/2008 [employer] Patient Information 2014 [employer].  

## 2013-04-25 LAB — TYPE AND SCREEN: Unit division: 0

## 2013-05-08 ENCOUNTER — Other Ambulatory Visit (HOSPITAL_BASED_OUTPATIENT_CLINIC_OR_DEPARTMENT_OTHER): Payer: Medicare Other | Admitting: Lab

## 2013-05-08 ENCOUNTER — Ambulatory Visit (HOSPITAL_BASED_OUTPATIENT_CLINIC_OR_DEPARTMENT_OTHER): Payer: Medicare Other | Admitting: Oncology

## 2013-05-08 ENCOUNTER — Ambulatory Visit (HOSPITAL_BASED_OUTPATIENT_CLINIC_OR_DEPARTMENT_OTHER): Payer: Medicare Other

## 2013-05-08 ENCOUNTER — Telehealth: Payer: Self-pay | Admitting: *Deleted

## 2013-05-08 VITALS — BP 119/54 | HR 60 | Temp 97.6°F | Resp 20

## 2013-05-08 VITALS — BP 122/35 | HR 75 | Temp 98.0°F | Resp 18 | Ht 69.0 in | Wt 180.1 lb

## 2013-05-08 DIAGNOSIS — D47Z9 Other specified neoplasms of uncertain behavior of lymphoid, hematopoietic and related tissue: Secondary | ICD-10-CM

## 2013-05-08 DIAGNOSIS — D471 Chronic myeloproliferative disease: Secondary | ICD-10-CM

## 2013-05-08 DIAGNOSIS — D649 Anemia, unspecified: Secondary | ICD-10-CM

## 2013-05-08 DIAGNOSIS — D709 Neutropenia, unspecified: Secondary | ICD-10-CM

## 2013-05-08 LAB — CBC WITH DIFFERENTIAL/PLATELET
BASO%: 1.2 % (ref 0.0–2.0)
HCT: 22.9 % — ABNORMAL LOW (ref 38.4–49.9)
HGB: 7.7 g/dL — ABNORMAL LOW (ref 13.0–17.1)
MCHC: 33.7 g/dL (ref 32.0–36.0)
MONO#: 0.1 10*3/uL (ref 0.1–0.9)
NEUT%: 64.6 % (ref 39.0–75.0)
RDW: 20.5 % — ABNORMAL HIGH (ref 11.0–14.6)
WBC: 2.1 10*3/uL — ABNORMAL LOW (ref 4.0–10.3)
lymph#: 0.6 10*3/uL — ABNORMAL LOW (ref 0.9–3.3)

## 2013-05-08 LAB — TECHNOLOGIST REVIEW: Technologist Review: 3

## 2013-05-08 LAB — PREPARE RBC (CROSSMATCH)

## 2013-05-08 MED ORDER — ACETAMINOPHEN 325 MG PO TABS
650.0000 mg | ORAL_TABLET | Freq: Once | ORAL | Status: AC
  Administered 2013-05-08: 650 mg via ORAL

## 2013-05-08 MED ORDER — DIPHENHYDRAMINE HCL 25 MG PO CAPS
25.0000 mg | ORAL_CAPSULE | Freq: Once | ORAL | Status: AC
  Administered 2013-05-08: 25 mg via ORAL

## 2013-05-08 MED ORDER — SODIUM CHLORIDE 0.9 % IV SOLN
250.0000 mL | Freq: Once | INTRAVENOUS | Status: AC
  Administered 2013-05-08: 250 mL via INTRAVENOUS

## 2013-05-08 MED ORDER — DARBEPOETIN ALFA-POLYSORBATE 300 MCG/0.6ML IJ SOLN
300.0000 ug | Freq: Once | INTRAMUSCULAR | Status: AC
  Administered 2013-05-08: 300 ug via SUBCUTANEOUS
  Filled 2013-05-08: qty 0.6

## 2013-05-08 NOTE — Telephone Encounter (Signed)
Per staff message and POF I have scheduled appts.  [name]  

## 2013-05-08 NOTE — Progress Notes (Signed)
Hematology and Oncology Follow Up Visit  Kyle Heath [id number] 01/04/1927 79 y.o. 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 He he might have an element of myelodysplastic syndrome as well. Bone marrow biopsy in 01/2012 confirmed the diagnosis.   Current therapy:  Supportive transfusions every 2-4 weeks as needed  Interim History:  Kyle Heath presents today for a followup visit with his daughter. He is a pleasant 74 year old gentleman who presented with a severe anemia and the above diagnosis. He did have a bone marrow biopsy performed on 02/11/2012 that showed an extensive fibrosis indicating a myeloproliferative disorder more specifically myelofibrosis. He continues to ambulate without the aid of a walker or difficulty. Able to toilet himself and dress himself. Fatigue is stable. Denies chest pain, shortness of breath, and dyspnea.  Appetite and weight are stable. No new complications at this time.   Medications: I have reviewed the patient's current medications. Current Outpatient Prescriptions  Medication Sig Dispense Refill  . aspirin 81 MG tablet Take 81 mg by mouth daily after breakfast.       . carvedilol (COREG) 25 MG tablet Take 1 tablet (25 mg total) by mouth 2 (two) times daily with a meal.  60 tablet  0  . diltiazem (CARDIZEM CD) 360 MG 24 hr capsule Take 1 capsule (360 mg total) by mouth daily.  30 capsule  2  . febuxostat (ULORIC) 40 MG tablet Take 40 mg by mouth daily.      . furosemide (LASIX) 40 MG tablet Take 1 tablet (40 mg total) by mouth daily.  30 tablet  1  . levothyroxine (SYNTHROID, LEVOTHROID) 100 MCG tablet Take 100 mcg by mouth daily before breakfast.      . losartan (COZAAR) 100 MG tablet Take 50 mg by mouth daily.       . Multiple Vitamin (MULTIVITAMIN WITH MINERALS) TABS Take 1 tablet by mouth daily.      . potassium chloride SA (K-DUR,KLOR-CON)  20 MEQ tablet Take 1 tablet (20 mEq total) by mouth daily.  30 tablet  1   No current facility-administered medications for this visit.   Facility-Administered Medications Ordered in Other Visits  Medication Dose Route Frequency Provider Last Rate Last Dose  . furosemide (LASIX) injection 20 mg  20 mg Intravenous Once Myrtis Ser, NP         Allergies: No Known Allergies  Past Medical History, Surgical history, Social history, and Family History were reviewed and updated.  Review of Systems: Constitutional:  Negative for fever, chills, night sweats,  pain. Cardiovascular: no chest pain or dyspnea on exertion Respiratory: negative Neurological: negative Dermatological: negative ENT: negative Skin: Negative. Gastrointestinal: negative Genito-Urinary: negative Hematological and Lymphatic: negative Breast: negative Musculoskeletal: negative Remaining ROS negative.  Physical Exam: Blood pressure 122/35, pulse 75, temperature 98 F (36.7 C), resp. rate 18, height 5\' 9"  (1.753 m), weight 180 lb 1.6 oz (81.693 kg), SpO2 97.00%. ECOG: 2 General appearance: alert. Pleasantly confused. Head: Normocephalic, without obvious abnormality, atraumatic Neck: no adenopathy, no carotid bruit, no JVD, supple, symmetrical, trachea midline and thyroid not enlarged, symmetric, no tenderness/mass/nodules Lymph nodes: Cervical, supraclavicular, and axillary nodes normal. Heart:regular rate and rhythm, S1, S2 normal, no murmur, click, rub or gallop Lung:chest clear, no wheezing, rales, normal symmetric air entry Abdomen: soft, non-tender, without masses or organomegaly EXT:no erythema, induration, or nodules. 1+ edema bilaterally..   Lab Results:  CBC  Component Value Date/Time   WBC 2.1* 05/08/2013 0932   WBC 2.3* 01/21/2013 1431   RBC 2.50* 05/08/2013 0932   RBC 3.12* 01/21/2013 1431   HGB 7.7* 05/08/2013 0932   HGB 9.0* 01/21/2013 1431   HCT 22.9* 05/08/2013 0932   HCT 27.4* 01/21/2013 1431    PLT 173 05/08/2013 0932   PLT 217 01/21/2013 1431   MCV 91.5 05/08/2013 0932   MCV 87.8 01/21/2013 1431   MCH 30.8 05/08/2013 0932   MCH 28.8 01/21/2013 1431   MCHC 33.7 05/08/2013 0932   MCHC 32.8 01/21/2013 1431   RDW 20.5* 05/08/2013 0932   RDW 20.3* 01/21/2013 1431   LYMPHSABS 0.6* 05/08/2013 0932   MONOABS 0.1 05/08/2013 0932   EOSABS 0.0 05/08/2013 0932   BASOSABS 0.0 05/08/2013 0932    Impression and Plan:  79 year old gentleman with the following issues:  1. 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 He could also have an element of myelodysplastic syndrome as well. He is currently on supportive care only. He does not have abdominal pain or a large spleen. Plan to check CBC every 2 weeks and  transfuse when symptomatic from his anemia. His hemoglobin is 7.7 today. Will given 2 units PRBCs and Aranesp today. His overall prognosis is very poor. I have explained to the patient and his daughter that his prognosis is poor and he is not a candidate for any aggressive treatment.   2. Neutropenia: Stable.  3. Thrombocytopenia : Resolved.   4. HTN: On Cardizem and Cozaar per PCP.  5. DM: Diet-controlled. He is followed by PCP.  6. Followup: Every 2 weeks for lab, injection, and possible transfusion and he will be seen for a visit in 8 weeks.     Zahra Peffley 6/20/201410:13 AM

## 2013-05-08 NOTE — Telephone Encounter (Signed)
Gave pt appt for labs, PRBC and Ml for July  and August 2014

## 2013-05-08 NOTE — Patient Instructions (Signed)
Blood Transfusion Information WHAT IS A BLOOD TRANSFUSION? A transfusion is the replacement of blood or some of its parts. Blood is made up of multiple cells which provide different functions.  Red blood cells carry oxygen and are used for blood loss replacement.  White blood cells fight against infection.  Platelets control bleeding.  Plasma helps clot blood.  Other blood products are available for specialized needs, such as hemophilia or other clotting disorders. BEFORE THE TRANSFUSION  Who gives blood for transfusions?   You may be able to donate blood to be used at a later date on yourself (autologous donation).  Relatives can be asked to donate blood. This is generally not any safer than if you have received blood from a stranger. The same precautions are taken to ensure safety when a relative's blood is donated.  Healthy volunteers who are fully evaluated to make sure their blood is safe. This is blood bank blood. Transfusion therapy is the safest it has ever been in the practice of medicine. Before blood is taken from a donor, a complete history is taken to make sure that person has no history of diseases nor engages in risky social behavior (examples are intravenous drug use or sexual activity with multiple partners). The donor's travel history is screened to minimize risk of transmitting infections, such as malaria. The donated blood is tested for signs of infectious diseases, such as HIV and hepatitis. The blood is then tested to be sure it is compatible with you in order to minimize the chance of a transfusion reaction. If you or a relative donates blood, this is often done in anticipation of surgery and is not appropriate for emergency situations. It takes many days to process the donated blood. RISKS AND COMPLICATIONS Although transfusion therapy is very safe and saves many lives, the main dangers of transfusion include:   Getting an infectious disease.  Developing a  transfusion reaction. This is an allergic reaction to something in the blood you were given. Every precaution is taken to prevent this. The decision to have a blood transfusion has been considered carefully by your caregiver before blood is given. Blood is not given unless the benefits outweigh the risks. AFTER THE TRANSFUSION  Right after receiving a blood transfusion, you will usually feel much better and more energetic. This is especially true if your red blood cells have gotten low (anemic). The transfusion raises the level of the red blood cells which carry oxygen, and this usually causes an energy increase.  The nurse administering the transfusion will monitor you carefully for complications. HOME CARE INSTRUCTIONS  No special instructions are needed after a transfusion. You may find your energy is better. Speak with your caregiver about any limitations on activity for underlying diseases you may have. SEEK MEDICAL CARE IF:   Your condition is not improving after your transfusion.  You develop redness or irritation at the intravenous (IV) site. SEEK IMMEDIATE MEDICAL CARE IF:  Any of the following symptoms occur over the next 12 hours:  Shaking chills.  You have a temperature by mouth above 102 F (38.9 C), not controlled by medicine.  Chest, back, or muscle pain.  People around you feel you are not acting correctly or are confused.  Shortness of breath or difficulty breathing.  Dizziness and fainting.  You get a rash or develop hives.  You have a decrease in urine output.  Your urine turns a dark color or changes to pink, red, or brown. Any of the following   symptoms occur over the next 10 days:  You have a temperature by mouth above 102 F (38.9 C), not controlled by medicine.  Shortness of breath.  Weakness after normal activity.  The white part of the eye turns yellow (jaundice).  You have a decrease in the amount of urine or are urinating less often.  Your  urine turns a dark color or changes to pink, red, or brown. Document Released: 11/02/2000 Document Revised: 01/28/2012 Document Reviewed: 06/21/2008 [employer] Patient Information 2014 [employer].  

## 2013-05-09 LAB — TYPE AND SCREEN
ABO/RH(D): O POS
Unit division: 0

## 2013-05-20 ENCOUNTER — Encounter (HOSPITAL_COMMUNITY)
Admission: RE | Admit: 2013-05-20 | Discharge: 2013-05-20 | Disposition: A | Discharge status: Home / Self Care | Payer: Medicare Other | Source: Ambulatory Visit | Attending: Oncology | Admitting: Oncology

## 2013-05-20 DIAGNOSIS — D539 Nutritional anemia, unspecified: Secondary | ICD-10-CM | POA: Insufficient documentation

## 2013-05-29 ENCOUNTER — Other Ambulatory Visit (HOSPITAL_BASED_OUTPATIENT_CLINIC_OR_DEPARTMENT_OTHER): Payer: Medicare Other | Admitting: Lab

## 2013-05-29 ENCOUNTER — Other Ambulatory Visit: Payer: Self-pay | Admitting: Oncology

## 2013-05-29 ENCOUNTER — Ambulatory Visit (HOSPITAL_BASED_OUTPATIENT_CLINIC_OR_DEPARTMENT_OTHER): Payer: Medicare Other

## 2013-05-29 VITALS — BP 115/50 | HR 60 | Temp 97.6°F | Resp 16

## 2013-05-29 DIAGNOSIS — D539 Nutritional anemia, unspecified: Secondary | ICD-10-CM

## 2013-05-29 DIAGNOSIS — D649 Anemia, unspecified: Secondary | ICD-10-CM

## 2013-05-29 LAB — CBC WITH DIFFERENTIAL/PLATELET
BASO%: 1.1 % (ref 0.0–2.0)
Basophils Absolute: 0 10*3/uL (ref 0.0–0.1)
EOS%: 1.3 % (ref 0.0–7.0)
HCT: 21.1 % — ABNORMAL LOW (ref 38.4–49.9)
HGB: 7.2 g/dL — ABNORMAL LOW (ref 13.0–17.1)
LYMPH%: 32.1 % (ref 14.0–49.0)
MCH: 30.8 pg (ref 27.2–33.4)
MCHC: 34 g/dL (ref 32.0–36.0)
MCV: 90.4 fL (ref 79.3–98.0)
NEUT%: 56.3 % (ref 39.0–75.0)
Platelets: 143 10*3/uL (ref 140–400)

## 2013-05-29 LAB — PREPARE RBC (CROSSMATCH)

## 2013-05-29 MED ORDER — SODIUM CHLORIDE 0.9 % IV SOLN: 250.0000 mL | Freq: Once | INTRAVENOUS | Status: DC

## 2013-05-29 MED ORDER — DIPHENHYDRAMINE HCL 25 MG PO CAPS
25.0000 mg | ORAL_CAPSULE | Freq: Once | ORAL | Status: AC
  Administered 2013-05-29: 25 mg via ORAL

## 2013-05-29 MED ORDER — ACETAMINOPHEN 325 MG PO TABS
650.0000 mg | ORAL_TABLET | Freq: Once | ORAL | Status: AC
  Administered 2013-05-29: 650 mg via ORAL

## 2013-05-29 NOTE — Progress Notes (Signed)
Patient tolerated 2 units PRBC's without any issues.  D/c instructions given and reviewed.  Allayne Butcher [facility]  05/29/2013  3:05 PM

## 2013-05-29 NOTE — Patient Instructions (Addendum)
Blood Transfusion  A blood transfusion replaces your blood or some of its parts. Blood is replaced when you have lost blood because of surgery, an accident, or for severe blood conditions like anemia. You can donate blood to be used on yourself if you have a planned surgery. If you lose blood during that surgery, your own blood can be given back to you. Any blood given to you is checked to make sure it matches your blood type. Your temperature, blood pressure, and heart rate (vital signs) will be checked often.  GET HELP RIGHT AWAY IF:   You feel sick to your stomach (nauseous) or throw up (vomit).  You have watery poop (diarrhea).  You have shortness of breath or trouble breathing.  You have blood in your pee (urine) or have dark colored pee.  You have chest pain or tightness.  Your eyes or skin turn yellow (jaundice).  You have a temperature by mouth above 102 F (38.9 C), not controlled by medicine.  You start to shake and have chills.  You develop a a red rash (hives) or feel itchy.  You develop lightheadedness or feel confused.  You develop back, joint, or muscle pain.  You do not feel hungry (lost appetite).  You feel tired, restless, or nervous.  You develop belly (abdominal) cramps. Document Released: 02/01/2009 Document Revised: 01/28/2012 Document Reviewed: 02/01/2009 [employer] Patient Information 2014 [employer].  

## 2013-05-30 LAB — TYPE AND SCREEN: Unit division: 0

## 2013-06-12 ENCOUNTER — Other Ambulatory Visit: Payer: Self-pay | Admitting: Oncology

## 2013-06-12 ENCOUNTER — Other Ambulatory Visit (HOSPITAL_BASED_OUTPATIENT_CLINIC_OR_DEPARTMENT_OTHER): Payer: Medicare Other | Admitting: Lab

## 2013-06-12 ENCOUNTER — Ambulatory Visit (HOSPITAL_BASED_OUTPATIENT_CLINIC_OR_DEPARTMENT_OTHER): Payer: Medicare Other

## 2013-06-12 VITALS — BP 114/64 | HR 56 | Temp 97.9°F | Resp 18

## 2013-06-12 DIAGNOSIS — D539 Nutritional anemia, unspecified: Secondary | ICD-10-CM

## 2013-06-12 DIAGNOSIS — D649 Anemia, unspecified: Secondary | ICD-10-CM

## 2013-06-12 DIAGNOSIS — D47Z9 Other specified neoplasms of uncertain behavior of lymphoid, hematopoietic and related tissue: Secondary | ICD-10-CM

## 2013-06-12 LAB — HOLD TUBE, BLOOD BANK

## 2013-06-12 LAB — CBC WITH DIFFERENTIAL/PLATELET
BASO%: 0.4 % (ref 0.0–2.0)
EOS%: 1.6 % (ref 0.0–7.0)
LYMPH%: 25.8 % (ref 14.0–49.0)
MCH: 30.7 pg (ref 27.2–33.4)
MCHC: 33.5 g/dL (ref 32.0–36.0)
MCV: 91.7 fL (ref 79.3–98.0)
MONO#: 0.1 10*3/uL (ref 0.1–0.9)
MONO%: 6 % (ref 0.0–14.0)
Platelets: 143 10*3/uL (ref 140–400)
RBC: 2.57 10*6/uL — ABNORMAL LOW (ref 4.20–5.82)
WBC: 2.1 10*3/uL — ABNORMAL LOW (ref 4.0–10.3)

## 2013-06-12 LAB — PREPARE RBC (CROSSMATCH)

## 2013-06-12 LAB — TECHNOLOGIST REVIEW

## 2013-06-12 MED ORDER — ACETAMINOPHEN 325 MG PO TABS
650.0000 mg | ORAL_TABLET | Freq: Once | ORAL | Status: AC
  Administered 2013-06-12: 650 mg via ORAL

## 2013-06-12 MED ORDER — DARBEPOETIN ALFA-POLYSORBATE 300 MCG/0.6ML IJ SOLN
300.0000 ug | Freq: Once | INTRAMUSCULAR | Status: AC
  Administered 2013-06-12: 300 ug via SUBCUTANEOUS
  Filled 2013-06-12: qty 0.6

## 2013-06-12 MED ORDER — DIPHENHYDRAMINE HCL 25 MG PO CAPS
25.0000 mg | ORAL_CAPSULE | Freq: Once | ORAL | Status: AC
  Administered 2013-06-12: 25 mg via ORAL

## 2013-06-12 NOTE — Patient Instructions (Addendum)
Blood Transfusion Information WHAT IS A BLOOD TRANSFUSION? A transfusion is the replacement of blood or some of its parts. Blood is made up of multiple cells which provide different functions.  Red blood cells carry oxygen and are used for blood loss replacement.  White blood cells fight against infection.  Platelets control bleeding.  Plasma helps clot blood.  Other blood products are available for specialized needs, such as hemophilia or other clotting disorders. BEFORE THE TRANSFUSION  Who gives blood for transfusions?   You may be able to donate blood to be used at a later date on yourself (autologous donation).  Relatives can be asked to donate blood. This is generally not any safer than if you have received blood from a stranger. The same precautions are taken to ensure safety when a relative's blood is donated.  Healthy volunteers who are fully evaluated to make sure their blood is safe. This is blood bank blood. Transfusion therapy is the safest it has ever been in the practice of medicine. Before blood is taken from a donor, a complete history is taken to make sure that person has no history of diseases nor engages in risky social behavior (examples are intravenous drug use or sexual activity with multiple partners). The donor's travel history is screened to minimize risk of transmitting infections, such as malaria. The donated blood is tested for signs of infectious diseases, such as HIV and hepatitis. The blood is then tested to be sure it is compatible with you in order to minimize the chance of a transfusion reaction. If you or a relative donates blood, this is often done in anticipation of surgery and is not appropriate for emergency situations. It takes many days to process the donated blood. RISKS AND COMPLICATIONS Although transfusion therapy is very safe and saves many lives, the main dangers of transfusion include:   Getting an infectious disease.  Developing a  transfusion reaction. This is an allergic reaction to something in the blood you were given. Every precaution is taken to prevent this. The decision to have a blood transfusion has been considered carefully by your caregiver before blood is given. Blood is not given unless the benefits outweigh the risks. AFTER THE TRANSFUSION  Right after receiving a blood transfusion, you will usually feel much better and more energetic. This is especially true if your red blood cells have gotten low (anemic). The transfusion raises the level of the red blood cells which carry oxygen, and this usually causes an energy increase.  The nurse administering the transfusion will monitor you carefully for complications. HOME CARE INSTRUCTIONS  No special instructions are needed after a transfusion. You may find your energy is better. Speak with your caregiver about any limitations on activity for underlying diseases you may have. SEEK MEDICAL CARE IF:   Your condition is not improving after your transfusion.  You develop redness or irritation at the intravenous (IV) site. SEEK IMMEDIATE MEDICAL CARE IF:  Any of the following symptoms occur over the next 12 hours:  Shaking chills.  You have a temperature by mouth above 102 F (38.9 C), not controlled by medicine.  Chest, back, or muscle pain.  People around you feel you are not acting correctly or are confused.  Shortness of breath or difficulty breathing.  Dizziness and fainting.  You get a rash or develop hives.  You have a decrease in urine output.  Your urine turns a dark color or changes to pink, red, or brown. Any of the following   symptoms occur over the next 10 days:  You have a temperature by mouth above 102 F (38.9 C), not controlled by medicine.  Shortness of breath.  Weakness after normal activity.  The white part of the eye turns yellow (jaundice).  You have a decrease in the amount of urine or are urinating less often.  Your  urine turns a dark color or changes to pink, red, or brown. Document Released: 11/02/2000 Document Revised: 01/28/2012 Document Reviewed: 06/21/2008 Exit[employer] Patient Information 2014 [employer].  

## 2013-06-13 LAB — TYPE AND SCREEN
ABO/RH(D): O POS
Antibody Screen: NEGATIVE
Unit division: 0
Unit division: 0

## 2013-06-19 ENCOUNTER — Encounter (HOSPITAL_COMMUNITY)
Admission: RE | Admit: 2013-06-19 | Discharge: 2013-06-19 | Disposition: A | Discharge status: Home / Self Care | Payer: Medicare Other | Source: Ambulatory Visit | Attending: Oncology | Admitting: Oncology

## 2013-06-19 DIAGNOSIS — D539 Nutritional anemia, unspecified: Secondary | ICD-10-CM | POA: Insufficient documentation

## 2013-06-26 ENCOUNTER — Other Ambulatory Visit (HOSPITAL_BASED_OUTPATIENT_CLINIC_OR_DEPARTMENT_OTHER): Payer: Medicare Other | Admitting: Lab

## 2013-06-26 ENCOUNTER — Other Ambulatory Visit: Payer: Self-pay | Admitting: Oncology

## 2013-06-26 ENCOUNTER — Ambulatory Visit (HOSPITAL_BASED_OUTPATIENT_CLINIC_OR_DEPARTMENT_OTHER): Payer: Medicare Other

## 2013-06-26 ENCOUNTER — Other Ambulatory Visit: Payer: Self-pay | Admitting: Emergency Medicine

## 2013-06-26 VITALS — BP 111/57 | HR 55 | Temp 97.9°F | Resp 16

## 2013-06-26 DIAGNOSIS — D649 Anemia, unspecified: Secondary | ICD-10-CM

## 2013-06-26 DIAGNOSIS — D539 Nutritional anemia, unspecified: Secondary | ICD-10-CM

## 2013-06-26 DIAGNOSIS — D47Z9 Other specified neoplasms of uncertain behavior of lymphoid, hematopoietic and related tissue: Secondary | ICD-10-CM

## 2013-06-26 LAB — CBC WITH DIFFERENTIAL/PLATELET
BASO%: 2 % (ref 0.0–2.0)
EOS%: 2 % (ref 0.0–7.0)
MCH: 29.3 pg (ref 27.2–33.4)
MCHC: 31.3 g/dL — ABNORMAL LOW (ref 32.0–36.0)
NEUT%: 59.9 % (ref 39.0–75.0)
RBC: 2.66 10*6/uL — ABNORMAL LOW (ref 4.20–5.82)
RDW: 20.6 % — ABNORMAL HIGH (ref 11.0–14.6)
lymph#: 0.6 10*3/uL — ABNORMAL LOW (ref 0.9–3.3)
nRBC: 1 % — ABNORMAL HIGH (ref 0–0)

## 2013-06-26 LAB — PREPARE RBC (CROSSMATCH)

## 2013-06-26 LAB — HOLD TUBE, BLOOD BANK

## 2013-06-26 MED ORDER — DIPHENHYDRAMINE HCL 25 MG PO CAPS
25.0000 mg | ORAL_CAPSULE | Freq: Once | ORAL | Status: AC
  Administered 2013-06-26: 25 mg via ORAL

## 2013-06-26 MED ORDER — DARBEPOETIN ALFA-POLYSORBATE 300 MCG/0.6ML IJ SOLN
300.0000 ug | Freq: Once | INTRAMUSCULAR | Status: AC
  Administered 2013-06-26: 300 ug via SUBCUTANEOUS
  Filled 2013-06-26: qty 0.6

## 2013-06-26 MED ORDER — SODIUM CHLORIDE 0.9 % IJ SOLN
10.0000 mL | INTRAMUSCULAR | Status: DC | PRN
  Filled 2013-06-26: qty 10

## 2013-06-26 MED ORDER — SODIUM CHLORIDE 0.9 % IV SOLN
250.0000 mL | Freq: Once | INTRAVENOUS | Status: AC
  Administered 2013-06-26: 250 mL via INTRAVENOUS

## 2013-06-26 MED ORDER — ACETAMINOPHEN 325 MG PO TABS
650.0000 mg | ORAL_TABLET | Freq: Once | ORAL | Status: AC
  Administered 2013-06-26: 650 mg via ORAL

## 2013-06-26 NOTE — Patient Instructions (Signed)
Blood Transfusion Information WHAT IS A BLOOD TRANSFUSION? A transfusion is the replacement of blood or some of its parts. Blood is made up of multiple cells which provide different functions.  Red blood cells carry oxygen and are used for blood loss replacement.  White blood cells fight against infection.  Platelets control bleeding.  Plasma helps clot blood.  Other blood products are available for specialized needs, such as hemophilia or other clotting disorders. BEFORE THE TRANSFUSION  Who gives blood for transfusions?   You may be able to donate blood to be used at a later date on yourself (autologous donation).  Relatives can be asked to donate blood. This is generally not any safer than if you have received blood from a stranger. The same precautions are taken to ensure safety when a relative's blood is donated.  Healthy volunteers who are fully evaluated to make sure their blood is safe. This is blood bank blood. Transfusion therapy is the safest it has ever been in the practice of medicine. Before blood is taken from a donor, a complete history is taken to make sure that person has no history of diseases nor engages in risky social behavior (examples are intravenous drug use or sexual activity with multiple partners). The donor's travel history is screened to minimize risk of transmitting infections, such as malaria. The donated blood is tested for signs of infectious diseases, such as HIV and hepatitis. The blood is then tested to be sure it is compatible with you in order to minimize the chance of a transfusion reaction. If you or a relative donates blood, this is often done in anticipation of surgery and is not appropriate for emergency situations. It takes many days to process the donated blood. RISKS AND COMPLICATIONS Although transfusion therapy is very safe and saves many lives, the main dangers of transfusion include:   Getting an infectious disease.  Developing a  transfusion reaction. This is an allergic reaction to something in the blood you were given. Every precaution is taken to prevent this. The decision to have a blood transfusion has been considered carefully by your caregiver before blood is given. Blood is not given unless the benefits outweigh the risks. AFTER THE TRANSFUSION  Right after receiving a blood transfusion, you will usually feel much better and more energetic. This is especially true if your red blood cells have gotten low (anemic). The transfusion raises the level of the red blood cells which carry oxygen, and this usually causes an energy increase.  The nurse administering the transfusion will monitor you carefully for complications. HOME CARE INSTRUCTIONS  No special instructions are needed after a transfusion. You may find your energy is better. Speak with your caregiver about any limitations on activity for underlying diseases you may have. SEEK MEDICAL CARE IF:   Your condition is not improving after your transfusion.  You develop redness or irritation at the intravenous (IV) site. SEEK IMMEDIATE MEDICAL CARE IF:  Any of the following symptoms occur over the next 12 hours:  Shaking chills.  You have a temperature by mouth above 102 F (38.9 C), not controlled by medicine.  Chest, back, or muscle pain.  People around you feel you are not acting correctly or are confused.  Shortness of breath or difficulty breathing.  Dizziness and fainting.  You get a rash or develop hives.  You have a decrease in urine output.  Your urine turns a dark color or changes to pink, red, or brown. Any of the following   symptoms occur over the next 10 days:  You have a temperature by mouth above 102 F (38.9 C), not controlled by medicine.  Shortness of breath.  Weakness after normal activity.  The white part of the eye turns yellow (jaundice).  You have a decrease in the amount of urine or are urinating less often.  Your  urine turns a dark color or changes to pink, red, or brown. Document Released: 11/02/2000 Document Revised: 01/28/2012 Document Reviewed: 06/21/2008 [employer] Patient Information 2014 [employer].  

## 2013-06-27 LAB — TYPE AND SCREEN

## 2013-07-10 ENCOUNTER — Telehealth: Payer: Self-pay | Admitting: *Deleted

## 2013-07-10 ENCOUNTER — Ambulatory Visit (HOSPITAL_BASED_OUTPATIENT_CLINIC_OR_DEPARTMENT_OTHER): Payer: Medicare Other

## 2013-07-10 ENCOUNTER — Other Ambulatory Visit (HOSPITAL_BASED_OUTPATIENT_CLINIC_OR_DEPARTMENT_OTHER): Payer: Medicare Other

## 2013-07-10 ENCOUNTER — Ambulatory Visit (HOSPITAL_BASED_OUTPATIENT_CLINIC_OR_DEPARTMENT_OTHER): Payer: Medicare Other | Admitting: Oncology

## 2013-07-10 ENCOUNTER — Telehealth: Payer: Self-pay | Admitting: Dietician

## 2013-07-10 ENCOUNTER — Other Ambulatory Visit: Payer: Self-pay | Admitting: Oncology

## 2013-07-10 ENCOUNTER — Encounter: Payer: Self-pay | Admitting: Oncology

## 2013-07-10 VITALS — BP 109/51 | HR 61 | Temp 97.4°F | Resp 20

## 2013-07-10 DIAGNOSIS — D471 Chronic myeloproliferative disease: Secondary | ICD-10-CM

## 2013-07-10 DIAGNOSIS — D709 Neutropenia, unspecified: Secondary | ICD-10-CM

## 2013-07-10 DIAGNOSIS — D539 Nutritional anemia, unspecified: Secondary | ICD-10-CM

## 2013-07-10 DIAGNOSIS — D649 Anemia, unspecified: Secondary | ICD-10-CM

## 2013-07-10 DIAGNOSIS — D47Z9 Other specified neoplasms of uncertain behavior of lymphoid, hematopoietic and related tissue: Secondary | ICD-10-CM

## 2013-07-10 DIAGNOSIS — D696 Thrombocytopenia, unspecified: Secondary | ICD-10-CM

## 2013-07-10 LAB — CBC WITH DIFFERENTIAL/PLATELET
Basophils Absolute: 0 10*3/uL (ref 0.0–0.1)
EOS%: 1.7 % (ref 0.0–7.0)
HCT: 23.5 % — ABNORMAL LOW (ref 38.4–49.9)
HGB: 7.9 g/dL — ABNORMAL LOW (ref 13.0–17.1)
MCH: 31.1 pg (ref 27.2–33.4)
MONO#: 0.2 10*3/uL (ref 0.1–0.9)
NEUT%: 60.8 % (ref 39.0–75.0)
lymph#: 0.6 10*3/uL — ABNORMAL LOW (ref 0.9–3.3)

## 2013-07-10 MED ORDER — DIPHENHYDRAMINE HCL 25 MG PO CAPS
25.0000 mg | ORAL_CAPSULE | Freq: Once | ORAL | Status: AC
  Administered 2013-07-10: 25 mg via ORAL

## 2013-07-10 MED ORDER — ACETAMINOPHEN 325 MG PO TABS
650.0000 mg | ORAL_TABLET | Freq: Once | ORAL | Status: AC
  Administered 2013-07-10: 650 mg via ORAL

## 2013-07-10 MED ORDER — SODIUM CHLORIDE 0.9 % IV SOLN
250.0000 mL | Freq: Once | INTRAVENOUS | Status: AC
  Administered 2013-07-10: 250 mL via INTRAVENOUS

## 2013-07-10 MED ORDER — DARBEPOETIN ALFA-POLYSORBATE 300 MCG/0.6ML IJ SOLN
300.0000 ug | Freq: Once | INTRAMUSCULAR | Status: AC
  Administered 2013-07-10: 300 ug via SUBCUTANEOUS
  Filled 2013-07-10: qty 0.6

## 2013-07-10 NOTE — Patient Instructions (Signed)
I have scheduled you for labs, Aranesp injection, and possible blood transfusion every 2 weeks as we have been doing.  For your shoulder pain, you may use Aleve 1 tab twice a day for up to 10 days. Take with food. If your shoulder pain does not improve, I would recommend that you follow-up with your primary care doctor for further management.

## 2013-07-10 NOTE — Progress Notes (Signed)
Hematology and Oncology Follow Up Visit  Kyle Heath [id number] 08-Oct-1927 79 y.o. 07/10/2013 12:46 PM    Principle Diagnosis: 79 year old gentleman with diagnosis of pancytopenia and more specifically severe anemia likely due to myelofibrosis. He he might have an element of myelodysplastic syndrome as well. Bone marrow biopsy in 01/2012 confirmed the diagnosis.   Current therapy:  Supportive transfusions every 2-4 weeks as needed  Interim History:  Kyle Heath presents today in the infusion room for a routine visit. He is a pleasant 79 year old gentleman who presented with a severe anemia and the above diagnosis. He did have a bone marrow biopsy performed on 02/11/2012 that showed an extensive fibrosis indicating a myeloproliferative disorder more specifically myelofibrosis. He continues to ambulate without the aid of a walker or difficulty. Able to toilet himself and dress himself. Fatigue is stable. Denies chest pain, shortness of breath, and dyspnea.  Appetite and weight are stable. No new complications at this time.   Medications: I have reviewed the patient's current medications. Current Outpatient Prescriptions  Medication Sig Dispense Refill  . aspirin 81 MG tablet Take 81 mg by mouth daily after breakfast.       . carvedilol (COREG) 25 MG tablet Take 1 tablet (25 mg total) by mouth 2 (two) times daily with a meal.  60 tablet  0  . diltiazem (CARDIZEM CD) 360 MG 24 hr capsule Take 1 capsule (360 mg total) by mouth daily.  30 capsule  2  . febuxostat (ULORIC) 40 MG tablet Take 40 mg by mouth daily.      . furosemide (LASIX) 40 MG tablet Take 1 tablet (40 mg total) by mouth daily.  30 tablet  1  . levothyroxine (SYNTHROID, LEVOTHROID) 100 MCG tablet Take 100 mcg by mouth daily before breakfast.      . losartan (COZAAR) 100 MG tablet Take 50 mg by mouth daily.       . Multiple Vitamin (MULTIVITAMIN WITH MINERALS) TABS Take 1 tablet by mouth daily.      . potassium chloride SA  (K-DUR,KLOR-CON) 20 MEQ tablet Take 1 tablet (20 mEq total) by mouth daily.  30 tablet  1   No current facility-administered medications for this visit.   Facility-Administered Medications Ordered in Other Visits  Medication Dose Route Frequency Provider Last Rate Last Dose  . darbepoetin (ARANESP) injection 300 mcg  300 mcg Subcutaneous Once Myrtis Ser, NP      . furosemide (LASIX) injection 20 mg  20 mg Intravenous Once Myrtis Ser, NP         Allergies: No Known Allergies  Past Medical History, Surgical history, Social history, and Family History were reviewed and updated.  Review of Systems: Constitutional:  Negative for fever, chills, night sweats,  pain. Cardiovascular: no chest pain or dyspnea on exertion Respiratory: negative Neurological: negative Dermatological: negative ENT: negative Skin: Negative. Gastrointestinal: negative Genito-Urinary: negative Hematological and Lymphatic: negative Breast: negative Musculoskeletal: negative Remaining ROS negative.  Physical Exam: There were no vitals taken for this visit. ECOG: 2 General appearance: alert. Pleasantly confused. Head: Normocephalic, without obvious abnormality, atraumatic Neck: no adenopathy, no carotid bruit, no JVD, supple, symmetrical, trachea midline and thyroid not enlarged, symmetric, no tenderness/mass/nodules Lymph nodes: Cervical, supraclavicular, and axillary nodes normal. Heart:regular rate and rhythm, S1, S2 normal, no murmur, click, rub or gallop Lung:chest clear, no wheezing, rales, normal symmetric air entry Abdomen: soft, non-tender, without masses or organomegaly EXT:no erythema, induration, or nodules. 1+ edema bilaterally..   Lab Results:  CBC  Component Value Date/Time   WBC 2.1* 07/10/2013 0807   WBC 2.3* 01/21/2013 1431   RBC 2.53* 07/10/2013 0807   RBC 3.12* 01/21/2013 1431   HGB 7.9* 07/10/2013 0807   HGB 9.0* 01/21/2013 1431   HCT 23.5* 07/10/2013 0807   HCT 27.4*  01/21/2013 1431   PLT 128* 07/10/2013 0807   PLT 217 01/21/2013 1431   MCV 92.9 07/10/2013 0807   MCV 87.8 01/21/2013 1431   MCH 31.1 07/10/2013 0807   MCH 28.8 01/21/2013 1431   MCHC 33.5 07/10/2013 0807   MCHC 32.8 01/21/2013 1431   RDW 20.3* 07/10/2013 0807   RDW 20.3* 01/21/2013 1431   LYMPHSABS 0.6* 07/10/2013 0807   MONOABS 0.2 07/10/2013 0807   EOSABS 0.0 07/10/2013 0807   BASOSABS 0.0 07/10/2013 0807    Impression and Plan:  79 year old gentleman with the following issues:  1. 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 He could also have an element of myelodysplastic syndrome as well. He is currently on supportive care only. He does not have abdominal pain or a large spleen. Plan to check CBC every 2 weeks and  transfuse when symptomatic from his anemia. His hemoglobin is 7.9 today. Will given 2 units PRBCs and Aranesp today. His overall prognosis is very poor. I have explained to the patient and his daughter that his prognosis is poor and he is not a candidate for any aggressive treatment.   2. Neutropenia: Stable.  3. Thrombocytopenia : Stable. No active bleeding. No transfusion indicated.   4. HTN: On Cardizem and Cozaar per PCP.  5. DM: Diet-controlled. He is followed by PCP.  6. Followup: Every 2 weeks for lab, injection, and possible transfusion and he will be seen for a visit in 8 weeks.     [place], [place] 8/22/201412:46 PM

## 2013-07-10 NOTE — Telephone Encounter (Signed)
Per staff message and POF I have scheduled appts.  [name]  

## 2013-07-10 NOTE — Patient Instructions (Signed)
Blood Transfusion  A blood transfusion replaces your blood or some of its parts. Blood is replaced when you have lost blood because of surgery, an accident, or for severe blood conditions like anemia. You can donate blood to be used on yourself if you have a planned surgery. If you lose blood during that surgery, your own blood can be given back to you. Any blood given to you is checked to make sure it matches your blood type. Your temperature, blood pressure, and heart rate (vital signs) will be checked often.  GET HELP RIGHT AWAY IF:   You feel sick to your stomach (nauseous) or throw up (vomit).  You have watery poop (diarrhea).  You have shortness of breath or trouble breathing.  You have blood in your pee (urine) or have dark colored pee.  You have chest pain or tightness.  Your eyes or skin turn yellow (jaundice).  You have a temperature by mouth above 102 F (38.9 C), not controlled by medicine.  You start to shake and have chills.  You develop a a red rash (hives) or feel itchy.  You develop lightheadedness or feel confused.  You develop back, joint, or muscle pain.  You do not feel hungry (lost appetite).  You feel tired, restless, or nervous.  You develop belly (abdominal) cramps. Document Released: 02/01/2009 Document Revised: 01/28/2012 Document Reviewed: 02/01/2009 [employer] Patient Information 2014 [employer].  

## 2013-07-11 LAB — TYPE AND SCREEN: Unit division: 0

## 2013-07-13 ENCOUNTER — Ambulatory Visit: Payer: Medicare Other | Admitting: Oncology

## 2013-07-24 ENCOUNTER — Ambulatory Visit: Payer: Medicare Other

## 2013-07-24 ENCOUNTER — Ambulatory Visit (HOSPITAL_BASED_OUTPATIENT_CLINIC_OR_DEPARTMENT_OTHER): Payer: Medicare Other

## 2013-07-24 ENCOUNTER — Other Ambulatory Visit (HOSPITAL_BASED_OUTPATIENT_CLINIC_OR_DEPARTMENT_OTHER): Payer: Medicare Other | Admitting: Lab

## 2013-07-24 ENCOUNTER — Other Ambulatory Visit: Payer: Self-pay | Admitting: Oncology

## 2013-07-24 ENCOUNTER — Ambulatory Visit (HOSPITAL_COMMUNITY)
Admission: RE | Admit: 2013-07-24 | Discharge: 2013-07-24 | Disposition: A | Discharge status: Home / Self Care | Payer: Medicare Other | Source: Ambulatory Visit | Attending: Oncology | Admitting: Oncology

## 2013-07-24 VITALS — BP 114/44 | HR 58 | Temp 98.2°F | Resp 20

## 2013-07-24 DIAGNOSIS — D539 Nutritional anemia, unspecified: Secondary | ICD-10-CM

## 2013-07-24 DIAGNOSIS — D649 Anemia, unspecified: Secondary | ICD-10-CM

## 2013-07-24 DIAGNOSIS — D47Z9 Other specified neoplasms of uncertain behavior of lymphoid, hematopoietic and related tissue: Secondary | ICD-10-CM

## 2013-07-24 DIAGNOSIS — D471 Chronic myeloproliferative disease: Secondary | ICD-10-CM

## 2013-07-24 LAB — CBC WITH DIFFERENTIAL/PLATELET
Eosinophils Absolute: 0 10*3/uL (ref 0.0–0.5)
HCT: 24.4 % — ABNORMAL LOW (ref 38.4–49.9)
LYMPH%: 26.9 % (ref 14.0–49.0)
MONO#: 0.2 10*3/uL (ref 0.1–0.9)
NEUT#: 1.2 10*3/uL — ABNORMAL LOW (ref 1.5–6.5)
NEUT%: 62.3 % (ref 39.0–75.0)
Platelets: 121 10*3/uL — ABNORMAL LOW (ref 140–400)
WBC: 2 10*3/uL — ABNORMAL LOW (ref 4.0–10.3)

## 2013-07-24 LAB — PREPARE RBC (CROSSMATCH)

## 2013-07-24 LAB — HOLD TUBE, BLOOD BANK

## 2013-07-24 MED ORDER — DIPHENHYDRAMINE HCL 25 MG PO CAPS
25.0000 mg | ORAL_CAPSULE | Freq: Once | ORAL | Status: AC
  Administered 2013-07-24: 25 mg via ORAL

## 2013-07-24 MED ORDER — ACETAMINOPHEN 325 MG PO TABS
650.0000 mg | ORAL_TABLET | Freq: Once | ORAL | Status: AC
  Administered 2013-07-24: 650 mg via ORAL

## 2013-07-24 MED ORDER — DIPHENHYDRAMINE HCL 25 MG PO CAPS
ORAL_CAPSULE | ORAL | Status: AC
  Filled 2013-07-24: qty 1

## 2013-07-24 MED ORDER — ACETAMINOPHEN 325 MG PO TABS
ORAL_TABLET | ORAL | Status: AC
  Filled 2013-07-24: qty 2

## 2013-07-24 MED ORDER — SODIUM CHLORIDE 0.9 % IV SOLN: 250.0000 mL | Freq: Once | INTRAVENOUS | Status: DC

## 2013-07-24 NOTE — Patient Instructions (Addendum)
Blood Transfusion Information WHAT IS A BLOOD TRANSFUSION? A transfusion is the replacement of blood or some of its parts. Blood is made up of multiple cells which provide different functions.  Red blood cells carry oxygen and are used for blood loss replacement.  White blood cells fight against infection.  Platelets control bleeding.  Plasma helps clot blood.  Other blood products are available for specialized needs, such as hemophilia or other clotting disorders. BEFORE THE TRANSFUSION  Who gives blood for transfusions?   You may be able to donate blood to be used at a later date on yourself (autologous donation).  Relatives can be asked to donate blood. This is generally not any safer than if you have received blood from a stranger. The same precautions are taken to ensure safety when a relative's blood is donated.  Healthy volunteers who are fully evaluated to make sure their blood is safe. This is blood bank blood. Transfusion therapy is the safest it has ever been in the practice of medicine. Before blood is taken from a donor, a complete history is taken to make sure that person has no history of diseases nor engages in risky social behavior (examples are intravenous drug use or sexual activity with multiple partners). The donor's travel history is screened to minimize risk of transmitting infections, such as malaria. The donated blood is tested for signs of infectious diseases, such as HIV and hepatitis. The blood is then tested to be sure it is compatible with you in order to minimize the chance of a transfusion reaction. If you or a relative donates blood, this is often done in anticipation of surgery and is not appropriate for emergency situations. It takes many days to process the donated blood. RISKS AND COMPLICATIONS Although transfusion therapy is very safe and saves many lives, the main dangers of transfusion include:   Getting an infectious disease.  Developing a  transfusion reaction. This is an allergic reaction to something in the blood you were given. Every precaution is taken to prevent this. The decision to have a blood transfusion has been considered carefully by your caregiver before blood is given. Blood is not given unless the benefits outweigh the risks. AFTER THE TRANSFUSION  Right after receiving a blood transfusion, you will usually feel much better and more energetic. This is especially true if your red blood cells have gotten low (anemic). The transfusion raises the level of the red blood cells which carry oxygen, and this usually causes an energy increase.  The nurse administering the transfusion will monitor you carefully for complications. HOME CARE INSTRUCTIONS  No special instructions are needed after a transfusion. You may find your energy is better. Speak with your caregiver about any limitations on activity for underlying diseases you may have. SEEK MEDICAL CARE IF:   Your condition is not improving after your transfusion.  You develop redness or irritation at the intravenous (IV) site. SEEK IMMEDIATE MEDICAL CARE IF:  Any of the following symptoms occur over the next 12 hours:  Shaking chills.  You have a temperature by mouth above 102 F (38.9 C), not controlled by medicine.  Chest, back, or muscle pain.  People around you feel you are not acting correctly or are confused.  Shortness of breath or difficulty breathing.  Dizziness and fainting.  You get a rash or develop hives.  You have a decrease in urine output.  Your urine turns a dark color or changes to pink, red, or brown. Any of the following   symptoms occur over the next 10 days:  You have a temperature by mouth above 102 F (38.9 C), not controlled by medicine.  Shortness of breath.  Weakness after normal activity.  The white part of the eye turns yellow (jaundice).  You have a decrease in the amount of urine or are urinating less often.  Your  urine turns a dark color or changes to pink, red, or brown. Document Released: 11/02/2000 Document Revised: 01/28/2012 Document Reviewed: 06/21/2008 Exit[employer] Patient Information 2014 [employer].  

## 2013-07-25 LAB — TYPE AND SCREEN
ABO/RH(D): O POS
Antibody Screen: NEGATIVE
Unit division: 0

## 2013-08-07 ENCOUNTER — Other Ambulatory Visit (HOSPITAL_BASED_OUTPATIENT_CLINIC_OR_DEPARTMENT_OTHER): Payer: Medicare Other | Admitting: Lab

## 2013-08-07 ENCOUNTER — Ambulatory Visit: Payer: Medicare Other

## 2013-08-07 ENCOUNTER — Other Ambulatory Visit: Payer: Self-pay | Admitting: Oncology

## 2013-08-07 VITALS — BP 119/42 | HR 64 | Temp 98.2°F | Resp 17

## 2013-08-07 DIAGNOSIS — D471 Chronic myeloproliferative disease: Secondary | ICD-10-CM

## 2013-08-07 DIAGNOSIS — D649 Anemia, unspecified: Secondary | ICD-10-CM

## 2013-08-07 DIAGNOSIS — D47Z9 Other specified neoplasms of uncertain behavior of lymphoid, hematopoietic and related tissue: Secondary | ICD-10-CM

## 2013-08-07 DIAGNOSIS — D539 Nutritional anemia, unspecified: Secondary | ICD-10-CM

## 2013-08-07 LAB — CBC WITH DIFFERENTIAL/PLATELET
Basophils Absolute: 0 10*3/uL (ref 0.0–0.1)
Eosinophils Absolute: 0 10*3/uL (ref 0.0–0.5)
HCT: 24 % — ABNORMAL LOW (ref 38.4–49.9)
HGB: 8 g/dL — ABNORMAL LOW (ref 13.0–17.1)
LYMPH%: 30 % (ref 14.0–49.0)
MCV: 91.3 fL (ref 79.3–98.0)
MONO#: 0.2 10*3/uL (ref 0.1–0.9)
MONO%: 9.3 % (ref 0.0–14.0)
NEUT#: 1.1 10*3/uL — ABNORMAL LOW (ref 1.5–6.5)
Platelets: 108 10*3/uL — ABNORMAL LOW (ref 140–400)
WBC: 1.8 10*3/uL — ABNORMAL LOW (ref 4.0–10.3)

## 2013-08-07 LAB — PREPARE RBC (CROSSMATCH)

## 2013-08-07 LAB — HOLD TUBE, BLOOD BANK

## 2013-08-07 MED ORDER — SODIUM CHLORIDE 0.9 % IV SOLN
250.0000 mL | Freq: Once | INTRAVENOUS | Status: AC
  Administered 2013-08-07: 250 mL via INTRAVENOUS

## 2013-08-07 MED ORDER — DIPHENHYDRAMINE HCL 25 MG PO CAPS
25.0000 mg | ORAL_CAPSULE | Freq: Once | ORAL | Status: AC
  Administered 2013-08-07: 25 mg via ORAL

## 2013-08-07 MED ORDER — DIPHENHYDRAMINE HCL 25 MG PO CAPS
ORAL_CAPSULE | ORAL | Status: AC
  Filled 2013-08-07: qty 1

## 2013-08-07 MED ORDER — ACETAMINOPHEN 325 MG PO TABS
ORAL_TABLET | ORAL | Status: AC
  Filled 2013-08-07: qty 2

## 2013-08-07 MED ORDER — ACETAMINOPHEN 325 MG PO TABS
650.0000 mg | ORAL_TABLET | Freq: Once | ORAL | Status: AC
  Administered 2013-08-07: 650 mg via ORAL

## 2013-08-07 NOTE — Progress Notes (Signed)
Given in infusion room.  

## 2013-08-07 NOTE — Patient Instructions (Addendum)
Blood Transfusion Information WHAT IS A BLOOD TRANSFUSION? A transfusion is the replacement of blood or some of its parts. Blood is made up of multiple cells which provide different functions.  Red blood cells carry oxygen and are used for blood loss replacement.  White blood cells fight against infection.  Platelets control bleeding.  Plasma helps clot blood.  Other blood products are available for specialized needs, such as hemophilia or other clotting disorders. BEFORE THE TRANSFUSION  Who gives blood for transfusions?   You may be able to donate blood to be used at a later date on yourself (autologous donation).  Relatives can be asked to donate blood. This is generally not any safer than if you have received blood from a stranger. The same precautions are taken to ensure safety when a relative's blood is donated.  Healthy volunteers who are fully evaluated to make sure their blood is safe. This is blood bank blood. Transfusion therapy is the safest it has ever been in the practice of medicine. Before blood is taken from a donor, a complete history is taken to make sure that person has no history of diseases nor engages in risky social behavior (examples are intravenous drug use or sexual activity with multiple partners). The donor's travel history is screened to minimize risk of transmitting infections, such as malaria. The donated blood is tested for signs of infectious diseases, such as HIV and hepatitis. The blood is then tested to be sure it is compatible with you in order to minimize the chance of a transfusion reaction. If you or a relative donates blood, this is often done in anticipation of surgery and is not appropriate for emergency situations. It takes many days to process the donated blood. RISKS AND COMPLICATIONS Although transfusion therapy is very safe and saves many lives, the main dangers of transfusion include:   Getting an infectious disease.  Developing a  transfusion reaction. This is an allergic reaction to something in the blood you were given. Every precaution is taken to prevent this. The decision to have a blood transfusion has been considered carefully by your caregiver before blood is given. Blood is not given unless the benefits outweigh the risks. AFTER THE TRANSFUSION  Right after receiving a blood transfusion, you will usually feel much better and more energetic. This is especially true if your red blood cells have gotten low (anemic). The transfusion raises the level of the red blood cells which carry oxygen, and this usually causes an energy increase.  The nurse administering the transfusion will monitor you carefully for complications. HOME CARE INSTRUCTIONS  No special instructions are needed after a transfusion. You may find your energy is better. Speak with your caregiver about any limitations on activity for underlying diseases you may have. SEEK MEDICAL CARE IF:   Your condition is not improving after your transfusion.  You develop redness or irritation at the intravenous (IV) site. SEEK IMMEDIATE MEDICAL CARE IF:  Any of the following symptoms occur over the next 12 hours:  Shaking chills.  You have a temperature by mouth above 102 F (38.9 C), not controlled by medicine.  Chest, back, or muscle pain.  People around you feel you are not acting correctly or are confused.  Shortness of breath or difficulty breathing.  Dizziness and fainting.  You get a rash or develop hives.  You have a decrease in urine output.  Your urine turns a dark color or changes to pink, red, or brown. Any of the following   symptoms occur over the next 10 days:  You have a temperature by mouth above 102 F (38.9 C), not controlled by medicine.  Shortness of breath.  Weakness after normal activity.  The white part of the eye turns yellow (jaundice).  You have a decrease in the amount of urine or are urinating less often.  Your  urine turns a dark color or changes to pink, red, or brown. Document Released: 11/02/2000 Document Revised: 01/28/2012 Document Reviewed: 06/21/2008 Exit[employer] Patient Information 2014 [employer].  

## 2013-08-10 LAB — TYPE AND SCREEN: Unit division: 0

## 2013-08-21 ENCOUNTER — Ambulatory Visit (HOSPITAL_BASED_OUTPATIENT_CLINIC_OR_DEPARTMENT_OTHER): Payer: Medicare Other

## 2013-08-21 ENCOUNTER — Other Ambulatory Visit (HOSPITAL_BASED_OUTPATIENT_CLINIC_OR_DEPARTMENT_OTHER): Payer: Medicare Other | Admitting: Lab

## 2013-08-21 ENCOUNTER — Ambulatory Visit (HOSPITAL_COMMUNITY)
Admission: RE | Admit: 2013-08-21 | Discharge: 2013-08-21 | Disposition: A | Discharge status: Home / Self Care | Payer: Medicare Other | Source: Ambulatory Visit | Attending: Oncology | Admitting: Oncology

## 2013-08-21 ENCOUNTER — Ambulatory Visit: Payer: Medicare Other

## 2013-08-21 VITALS — BP 123/52 | HR 75 | Temp 98.0°F | Resp 20

## 2013-08-21 DIAGNOSIS — D649 Anemia, unspecified: Secondary | ICD-10-CM

## 2013-08-21 DIAGNOSIS — D539 Nutritional anemia, unspecified: Secondary | ICD-10-CM | POA: Insufficient documentation

## 2013-08-21 DIAGNOSIS — D47Z9 Other specified neoplasms of uncertain behavior of lymphoid, hematopoietic and related tissue: Secondary | ICD-10-CM

## 2013-08-21 DIAGNOSIS — D471 Chronic myeloproliferative disease: Secondary | ICD-10-CM

## 2013-08-21 LAB — CBC WITH DIFFERENTIAL/PLATELET
Basophils Absolute: 0 10*3/uL (ref 0.0–0.1)
EOS%: 1.2 % (ref 0.0–7.0)
HCT: 23.8 % — ABNORMAL LOW (ref 38.4–49.9)
HGB: 8.1 g/dL — ABNORMAL LOW (ref 13.0–17.1)
MCH: 30.8 pg (ref 27.2–33.4)
MONO#: 0.2 10*3/uL (ref 0.1–0.9)
NEUT#: 1.4 10*3/uL — ABNORMAL LOW (ref 1.5–6.5)
NEUT%: 61.6 % (ref 39.0–75.0)
RDW: 17.8 % — ABNORMAL HIGH (ref 11.0–14.6)
WBC: 2.2 10*3/uL — ABNORMAL LOW (ref 4.0–10.3)
lymph#: 0.6 10*3/uL — ABNORMAL LOW (ref 0.9–3.3)

## 2013-08-21 LAB — TECHNOLOGIST REVIEW

## 2013-08-21 MED ORDER — DARBEPOETIN ALFA-POLYSORBATE 300 MCG/0.6ML IJ SOLN
300.0000 ug | Freq: Once | INTRAMUSCULAR | Status: AC
  Administered 2013-08-21: 300 ug via SUBCUTANEOUS
  Filled 2013-08-21: qty 0.6

## 2013-08-21 NOTE — Progress Notes (Signed)
Injection  Given in infusion.

## 2013-09-04 ENCOUNTER — Telehealth: Payer: Self-pay | Admitting: Oncology

## 2013-09-04 ENCOUNTER — Ambulatory Visit (HOSPITAL_BASED_OUTPATIENT_CLINIC_OR_DEPARTMENT_OTHER): Payer: Medicare Other

## 2013-09-04 ENCOUNTER — Ambulatory Visit: Payer: Medicare Other

## 2013-09-04 ENCOUNTER — Ambulatory Visit (HOSPITAL_BASED_OUTPATIENT_CLINIC_OR_DEPARTMENT_OTHER): Payer: Medicare Other | Admitting: Oncology

## 2013-09-04 ENCOUNTER — Other Ambulatory Visit: Payer: Self-pay | Admitting: *Deleted

## 2013-09-04 ENCOUNTER — Telehealth: Payer: Self-pay | Admitting: *Deleted

## 2013-09-04 ENCOUNTER — Other Ambulatory Visit (HOSPITAL_BASED_OUTPATIENT_CLINIC_OR_DEPARTMENT_OTHER): Payer: Medicare Other | Admitting: Lab

## 2013-09-04 VITALS — BP 121/50 | HR 80 | Temp 97.1°F | Resp 18 | Ht 69.0 in | Wt 176.5 lb

## 2013-09-04 VITALS — BP 119/43 | HR 60 | Temp 97.8°F | Resp 18

## 2013-09-04 DIAGNOSIS — D47Z9 Other specified neoplasms of uncertain behavior of lymphoid, hematopoietic and related tissue: Secondary | ICD-10-CM

## 2013-09-04 DIAGNOSIS — D539 Nutritional anemia, unspecified: Secondary | ICD-10-CM

## 2013-09-04 DIAGNOSIS — D649 Anemia, unspecified: Secondary | ICD-10-CM

## 2013-09-04 DIAGNOSIS — D469 Myelodysplastic syndrome, unspecified: Secondary | ICD-10-CM

## 2013-09-04 DIAGNOSIS — D696 Thrombocytopenia, unspecified: Secondary | ICD-10-CM

## 2013-09-04 DIAGNOSIS — I10 Essential (primary) hypertension: Secondary | ICD-10-CM

## 2013-09-04 DIAGNOSIS — E119 Type 2 diabetes mellitus without complications: Secondary | ICD-10-CM

## 2013-09-04 DIAGNOSIS — D709 Neutropenia, unspecified: Secondary | ICD-10-CM

## 2013-09-04 DIAGNOSIS — D471 Chronic myeloproliferative disease: Secondary | ICD-10-CM

## 2013-09-04 LAB — CBC WITH DIFFERENTIAL/PLATELET
BASO%: 1.9 % (ref 0.0–2.0)
Eosinophils Absolute: 0 10*3/uL (ref 0.0–0.5)
HCT: 20.7 % — ABNORMAL LOW (ref 38.4–49.9)
MCHC: 31.4 g/dL — ABNORMAL LOW (ref 32.0–36.0)
MONO#: 0.2 10*3/uL (ref 0.1–0.9)
NEUT#: 1.2 10*3/uL — ABNORMAL LOW (ref 1.5–6.5)
RBC: 2.25 10*6/uL — ABNORMAL LOW (ref 4.20–5.82)
WBC: 2.1 10*3/uL — ABNORMAL LOW (ref 4.0–10.3)
lymph#: 0.6 10*3/uL — ABNORMAL LOW (ref 0.9–3.3)
nRBC: 2 % — ABNORMAL HIGH (ref 0–0)

## 2013-09-04 LAB — TECHNOLOGIST REVIEW: Technologist Review: 3

## 2013-09-04 LAB — HOLD TUBE, BLOOD BANK

## 2013-09-04 MED ORDER — DIPHENHYDRAMINE HCL 25 MG PO CAPS
ORAL_CAPSULE | ORAL | Status: AC
  Filled 2013-09-04: qty 1

## 2013-09-04 MED ORDER — ACETAMINOPHEN 325 MG PO TABS
ORAL_TABLET | ORAL | Status: AC
  Filled 2013-09-04: qty 2

## 2013-09-04 MED ORDER — DARBEPOETIN ALFA-POLYSORBATE 300 MCG/0.6ML IJ SOLN
300.0000 ug | Freq: Once | INTRAMUSCULAR | Status: AC
  Administered 2013-09-04: 300 ug via SUBCUTANEOUS
  Filled 2013-09-04: qty 0.6

## 2013-09-04 MED ORDER — DIPHENHYDRAMINE HCL 25 MG PO CAPS
25.0000 mg | ORAL_CAPSULE | Freq: Once | ORAL | Status: AC
  Administered 2013-09-04: 25 mg via ORAL

## 2013-09-04 MED ORDER — ACETAMINOPHEN 325 MG PO TABS
650.0000 mg | ORAL_TABLET | Freq: Once | ORAL | Status: AC
  Administered 2013-09-04: 650 mg via ORAL

## 2013-09-04 MED ORDER — FUROSEMIDE 10 MG/ML IJ SOLN
20.0000 mg | Freq: Once | INTRAMUSCULAR | Status: AC
  Administered 2013-09-04: 20 mg via INTRAVENOUS

## 2013-09-04 MED ORDER — SODIUM CHLORIDE 0.9 % IV SOLN
250.0000 mL | Freq: Once | INTRAVENOUS | Status: AC
  Administered 2013-09-04: 250 mL via INTRAVENOUS

## 2013-09-04 NOTE — Patient Instructions (Signed)
Blood Products Information This is information about transfusions of blood products. All blood that is to be transfused is tested for blood type, compatibility with the recipient, and for infections. Except in emergencies, giving a transfusion requires a written consent. Blood transfusions are often given as packed red blood cells. This means the other parts of the blood have been taken out. Blood may be needed to treat severe anemia or bleeding. Other blood products include plasma, platelets, immune globulin, and cryoprecipitate. Blood for transfusion is mostly donated by volunteers. The blood donors are carefully screened for risk factors that could cause disease. Donors are all tested for infections that could be transmitted by blood. The blood product supply today is the safest it has ever been. Some risks do remain.  A minor reaction with fever, chills, or rash happens in about 1% of blood product transfusions.  Life-threatening reactions occur in less than 1 in a million transfusions.  Infection with germs (bacteria), viruses or parasites like malaria can still happen. The risk is very low.  Hepatitis B occurs in about 1 case in 150,000 transfusions.  Hepatitis C is seen once in 500,000.  HIV is transmitted less than once every million transfusions. When you receive a transfusion of packed red blood cells, your blood is tested for blood group and Rh type. Your blood is also screened for antibodies that could cause a serious reaction. A cross-match test is done to make sure the blood is safe to give.  Talk with your caregiver if you have any concerns about receiving a transfusion of blood products. Make sure your questions are answered. Transfusions are not given if your caregiver feels the risk is greater than the need. Document Released: 11/05/2005 Document Revised: 01/28/2012 Document Reviewed: 04/25/2007 [employer] Patient Information 2014 [employer].  

## 2013-09-04 NOTE — Telephone Encounter (Signed)
Per staff message and POF I have scheduled appts.  [name]  

## 2013-09-04 NOTE — Progress Notes (Signed)
Injection given in infusion

## 2013-09-04 NOTE — Telephone Encounter (Signed)
gv and printed appt sched and avs forpt for OCT, NOV and DEC

## 2013-09-04 NOTE — Progress Notes (Signed)
Hematology and Oncology Follow Up Visit  Kyle Heath [id number] 1927-10-30 79 y.o. 09/04/2013 9:36 AM    Principle Diagnosis: 79 year old gentleman with diagnosis of pancytopenia and more specifically severe anemia likely due to myelofibrosis. He he might have an element of myelodysplastic syndrome as well. Bone marrow biopsy in 01/2012 confirmed the diagnosis.   Current therapy:  Supportive transfusions every 2-4 weeks as needed  Interim History:  Kyle Heath presents today in the infusion room for a routine visit with his daughter. He is a pleasant 73 year old gentleman who presented with a severe anemia and the above diagnosis. He did have a bone marrow biopsy performed on 02/11/2012 that showed an extensive fibrosis indicating a myeloproliferative disorder more specifically myelofibrosis. He continues to be transfusion dependent but remains functional at this time. He continues to ambulate without the aid of a walker or difficulty. Able to toilet himself and dress himself. Fatigue is stable. Denies chest pain, shortness of breath, and dyspnea.  Appetite and weight are stable. No new complications at this time. He is not reporting any recent hospitalization or emesis. Is not reporting any recent bleeding or infections.  Medications: I have reviewed the patient's current medications. Current Outpatient Prescriptions  Medication Sig Dispense Refill  . aspirin 81 MG tablet Take 81 mg by mouth daily after breakfast.       . febuxostat (ULORIC) 40 MG tablet Take 40 mg by mouth daily.      [name] Kitchen levothyroxine (SYNTHROID, LEVOTHROID) 88 MCG tablet Take 88 mcg by mouth daily before breakfast.      . losartan (COZAAR) 100 MG tablet Take 50 mg by mouth daily.       . Multiple Vitamin (MULTIVITAMIN WITH MINERALS) TABS Take 1 tablet by mouth daily.      . potassium chloride SA (K-DUR,KLOR-CON) 20 MEQ tablet Take 1 tablet (20 mEq total) by mouth daily.  30 tablet  1  . TAZTIA XT 360 MG 24 hr capsule Take 360  mg by mouth daily.      . carvedilol (COREG) 25 MG tablet Take 1 tablet (25 mg total) by mouth 2 (two) times daily with a meal.  60 tablet  0  . diltiazem (CARDIZEM CD) 360 MG 24 hr capsule Take 1 capsule (360 mg total) by mouth daily.  30 capsule  2  . furosemide (LASIX) 40 MG tablet Take 1 tablet (40 mg total) by mouth daily.  30 tablet  1   No current facility-administered medications for this visit.   Facility-Administered Medications Ordered in Other Visits  Medication Dose Route Frequency Provider Last Rate Last Dose  . furosemide (LASIX) injection 20 mg  20 mg Intravenous Once Myrtis Ser, NP         Allergies: No Known Allergies  Past Medical History, Surgical history, Social history, and Family History were reviewed and updated.  Review of Systems:  Remaining ROS negative.  Physical Exam: Blood pressure 121/50, pulse 80, temperature 97.1 F (36.2 C), temperature source Oral, resp. rate 18, height 5\' 9"  (1.753 m), weight 176 lb 8 oz (80.06 kg). ECOG: 2 General appearance: alert. Pleasantly confused. Head: Normocephalic, without obvious abnormality, atraumatic Neck: no adenopathy, no carotid bruit, no JVD, supple, symmetrical, trachea midline and thyroid not enlarged, symmetric, no tenderness/mass/nodules Lymph nodes: Cervical, supraclavicular, and axillary nodes normal. Heart:regular rate and rhythm, S1, S2 normal, no murmur, click, rub or gallop Lung:chest clear, no wheezing, rales, normal symmetric air entry Abdomen: soft, non-tender, without masses or organomegaly EXT:no erythema, induration,  or nodules. 1+ edema bilaterally..   Lab Results:  CBC    Component Value Date/Time   WBC 2.1* 09/04/2013 0859   WBC 2.3* 01/21/2013 1431   RBC 2.25* 09/04/2013 0859   RBC 3.12* 01/21/2013 1431   HGB 6.5* 09/04/2013 0859   HGB 9.0* 01/21/2013 1431   HCT 20.7* 09/04/2013 0859   HCT 27.4* 01/21/2013 1431   PLT 134 Large and Giant platelets present* 09/04/2013 0859   PLT 217  01/21/2013 1431   MCV 92.0 09/04/2013 0859   MCV 87.8 01/21/2013 1431   MCH 28.9 09/04/2013 0859   MCH 28.8 01/21/2013 1431   MCHC 31.4* 09/04/2013 0859   MCHC 32.8 01/21/2013 1431   RDW 20.9* 09/04/2013 0859   RDW 20.3* 01/21/2013 1431   LYMPHSABS 0.6* 09/04/2013 0859   MONOABS 0.2 09/04/2013 0859   EOSABS 0.0 09/04/2013 0859   BASOSABS 0.0 09/04/2013 0859      Impression and Plan:  79 year old gentleman with the following issues:  1. 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 He could also have an element of myelodysplastic syndrome as well. He is currently on supportive care only. He does not have abdominal pain or a large spleen. Plan to check CBC every 2 weeks and  transfuse when symptomatic from his anemia. His hemoglobin is low today at 6.5. Will given 2 units PRBCs and Aranesp today. His overall prognosis is very poor. I have explained to the patient and his daughter that his prognosis is poor and he is not a candidate for any aggressive treatment.   2. Neutropenia: Stable.  3. Thrombocytopenia : Stable. No active bleeding. No transfusion indicated.   4. HTN: On Cardizem and Cozaar per PCP.  5. DM: Diet-controlled. He is followed by PCP.  6. Followup: Every 2 weeks for lab, injection, and possible transfusion and he will be seen for a visit in 6 weeks.     Kyle Heath 10/17/20149:36 AM

## 2013-09-05 LAB — TYPE AND SCREEN
ABO/RH(D): O POS
Unit division: 0
Unit division: 0

## 2013-09-18 ENCOUNTER — Other Ambulatory Visit: Payer: Self-pay | Admitting: *Deleted

## 2013-09-18 ENCOUNTER — Ambulatory Visit (HOSPITAL_BASED_OUTPATIENT_CLINIC_OR_DEPARTMENT_OTHER): Payer: Medicare Other

## 2013-09-18 ENCOUNTER — Ambulatory Visit: Payer: Medicare Other

## 2013-09-18 ENCOUNTER — Other Ambulatory Visit (HOSPITAL_BASED_OUTPATIENT_CLINIC_OR_DEPARTMENT_OTHER): Payer: Medicare Other | Admitting: Lab

## 2013-09-18 VITALS — BP 131/48 | HR 68 | Temp 98.4°F | Resp 18

## 2013-09-18 DIAGNOSIS — D539 Nutritional anemia, unspecified: Secondary | ICD-10-CM

## 2013-09-18 DIAGNOSIS — D469 Myelodysplastic syndrome, unspecified: Secondary | ICD-10-CM

## 2013-09-18 DIAGNOSIS — D47Z9 Other specified neoplasms of uncertain behavior of lymphoid, hematopoietic and related tissue: Secondary | ICD-10-CM

## 2013-09-18 DIAGNOSIS — D649 Anemia, unspecified: Secondary | ICD-10-CM

## 2013-09-18 LAB — HOLD TUBE, BLOOD BANK

## 2013-09-18 LAB — CBC WITH DIFFERENTIAL/PLATELET
BASO%: 1.3 % (ref 0.0–2.0)
Basophils Absolute: 0 10*3/uL (ref 0.0–0.1)
EOS%: 1.6 % (ref 0.0–7.0)
HGB: 7.2 g/dL — ABNORMAL LOW (ref 13.0–17.1)
LYMPH%: 30.5 % (ref 14.0–49.0)
MCH: 28.9 pg (ref 27.2–33.4)
MCHC: 32.4 g/dL (ref 32.0–36.0)
Platelets: 104 10*3/uL — ABNORMAL LOW (ref 140–400)
RDW: 21 % — ABNORMAL HIGH (ref 11.0–14.6)
lymph#: 0.6 10*3/uL — ABNORMAL LOW (ref 0.9–3.3)

## 2013-09-18 MED ORDER — ACETAMINOPHEN 325 MG PO TABS
650.0000 mg | ORAL_TABLET | Freq: Once | ORAL | Status: AC
  Administered 2013-09-18: 650 mg via ORAL

## 2013-09-18 MED ORDER — DIPHENHYDRAMINE HCL 25 MG PO CAPS
25.0000 mg | ORAL_CAPSULE | Freq: Once | ORAL | Status: AC
  Administered 2013-09-18: 25 mg via ORAL

## 2013-09-18 MED ORDER — DIPHENHYDRAMINE HCL 25 MG PO CAPS
ORAL_CAPSULE | ORAL | Status: AC
  Filled 2013-09-18: qty 1

## 2013-09-18 MED ORDER — SODIUM CHLORIDE 0.9 % IV SOLN
250.0000 mL | Freq: Once | INTRAVENOUS | Status: AC
  Administered 2013-09-18: 250 mL via INTRAVENOUS

## 2013-09-18 MED ORDER — FUROSEMIDE 10 MG/ML IJ SOLN
20.0000 mg | Freq: Once | INTRAMUSCULAR | Status: AC
  Administered 2013-09-18: 20 mg via INTRAVENOUS

## 2013-09-18 MED ORDER — ACETAMINOPHEN 325 MG PO TABS
ORAL_TABLET | ORAL | Status: AC
  Filled 2013-09-18: qty 2

## 2013-09-18 MED ORDER — DARBEPOETIN ALFA-POLYSORBATE 300 MCG/0.6ML IJ SOLN
300.0000 ug | Freq: Once | INTRAMUSCULAR | Status: AC
  Administered 2013-09-18: 300 ug via SUBCUTANEOUS
  Filled 2013-09-18: qty 0.6

## 2013-09-18 NOTE — Progress Notes (Signed)
Injection given on infusion encounter.

## 2013-09-18 NOTE — Patient Instructions (Addendum)
Blood Transfusion Information WHAT IS A BLOOD TRANSFUSION? A transfusion is the replacement of blood or some of its parts. Blood is made up of multiple cells which provide different functions.  Red blood cells carry oxygen and are used for blood loss replacement.  White blood cells fight against infection.  Platelets control bleeding.  Plasma helps clot blood.  Other blood products are available for specialized needs, such as hemophilia or other clotting disorders. BEFORE THE TRANSFUSION  Who gives blood for transfusions?   You may be able to donate blood to be used at a later date on yourself (autologous donation).  Relatives can be asked to donate blood. This is generally not any safer than if you have received blood from a stranger. The same precautions are taken to ensure safety when a relative's blood is donated.  Healthy volunteers who are fully evaluated to make sure their blood is safe. This is blood bank blood. Transfusion therapy is the safest it has ever been in the practice of medicine. Before blood is taken from a donor, a complete history is taken to make sure that person has no history of diseases nor engages in risky social behavior (examples are intravenous drug use or sexual activity with multiple partners). The donor's travel history is screened to minimize risk of transmitting infections, such as malaria. The donated blood is tested for signs of infectious diseases, such as HIV and hepatitis. The blood is then tested to be sure it is compatible with you in order to minimize the chance of a transfusion reaction. If you or a relative donates blood, this is often done in anticipation of surgery and is not appropriate for emergency situations. It takes many days to process the donated blood. RISKS AND COMPLICATIONS Although transfusion therapy is very safe and saves many lives, the main dangers of transfusion include:   Getting an infectious disease.  Developing a  transfusion reaction. This is an allergic reaction to something in the blood you were given. Every precaution is taken to prevent this. The decision to have a blood transfusion has been considered carefully by your caregiver before blood is given. Blood is not given unless the benefits outweigh the risks. AFTER THE TRANSFUSION  Right after receiving a blood transfusion, you will usually feel much better and more energetic. This is especially true if your red blood cells have gotten low (anemic). The transfusion raises the level of the red blood cells which carry oxygen, and this usually causes an energy increase.  The nurse administering the transfusion will monitor you carefully for complications. HOME CARE INSTRUCTIONS  No special instructions are needed after a transfusion. You may find your energy is better. Speak with your caregiver about any limitations on activity for underlying diseases you may have. SEEK MEDICAL CARE IF:   Your condition is not improving after your transfusion.  You develop redness or irritation at the intravenous (IV) site. SEEK IMMEDIATE MEDICAL CARE IF:  Any of the following symptoms occur over the next 12 hours:  Shaking chills.  You have a temperature by mouth above 102 F (38.9 C), not controlled by medicine.  Chest, back, or muscle pain.  People around you feel you are not acting correctly or are confused.  Shortness of breath or difficulty breathing.  Dizziness and fainting.  You get a rash or develop hives.  You have a decrease in urine output.  Your urine turns a dark color or changes to pink, red, or brown. Any of the following   symptoms occur over the next 10 days:  You have a temperature by mouth above 102 F (38.9 C), not controlled by medicine.  Shortness of breath.  Weakness after normal activity.  The white part of the eye turns yellow (jaundice).  You have a decrease in the amount of urine or are urinating less often.  Your  urine turns a dark color or changes to pink, red, or brown. Document Released: 11/02/2000 Document Revised: 01/28/2012 Document Reviewed: 06/21/2008 [facility] Patient Information 2014 [place], [place].  Darbepoetin Alfa injection What is this medicine? DARBEPOETIN ALFA (dar be POE e tin AL fa) helps your body make more red blood cells. It is used to treat anemia caused by chronic kidney failure and chemotherapy. This medicine may be used for other purposes; ask your health care provider or pharmacist if you have questions. What should I tell my health care provider before I take this medicine? They need to know if you have any of these conditions: -blood clotting disorders or history of blood clots -cancer patient not on chemotherapy -cystic fibrosis -heart disease, such as angina, heart failure, or a history of a heart attack -hemoglobin level of 12 g/dL or greater -high blood pressure -low levels of folate, iron, or vitamin B12 -seizures -an unusual or allergic reaction to darbepoetin, erythropoietin, albumin, hamster proteins, latex, other medicines, foods, dyes, or preservatives -pregnant or trying to get pregnant -breast-feeding How should I use this medicine? This medicine is for injection into a vein or under the skin. It is usually given by a health care professional in a hospital or clinic setting. If you get this medicine at home, you will be taught how to prepare and give this medicine. Do not shake the solution before you withdraw a dose. Use exactly as directed. Take your medicine at regular intervals. Do not take your medicine more often than directed. It is important that you put your used needles and syringes in a special sharps container. Do not put them in a trash can. If you do not have a sharps container, call your pharmacist or healthcare provider to get one. Talk to your pediatrician regarding the use of this medicine in children. While this medicine may be used in  children as young as 1 year for selected conditions, precautions do apply. Overdosage: If you think you have taken too much of this medicine contact a poison control center or emergency room at once. NOTE: This medicine is only for you. Do not share this medicine with others. What if I miss a dose? If you miss a dose, take it as soon as you can. If it is almost time for your next dose, take only that dose. Do not take double or extra doses. What may interact with this medicine? Do not take this medicine with any of the following medications: -epoetin alfa This list may not describe all possible interactions. Give your health care provider a list of all the medicines, herbs, non-prescription drugs, or dietary supplements you use. Also tell them if you smoke, drink alcohol, or use illegal drugs. Some items may interact with your medicine. What should I watch for while using this medicine? Visit your prescriber or health care professional for regular checks on your progress and for the needed blood tests and blood pressure measurements. It is especially important for the doctor to make sure your hemoglobin level is in the desired range, to limit the risk of potential side effects and to give you the best benefit. Keep all  appointments for any recommended tests. Check your blood pressure as directed. Ask your doctor what your blood pressure should be and when you should contact him or her. As your body makes more red blood cells, you may need to take iron, folic acid, or vitamin B supplements. Ask your doctor or health care provider which products are right for you. If you have kidney disease continue dietary restrictions, even though this medication can make you feel better. Talk with your doctor or health care professional about the foods you eat and the vitamins that you take. What side effects may I notice from receiving this medicine? Side effects that you should report to your doctor or health care  professional as soon as possible: -allergic reactions like skin rash, itching or hives, swelling of the face, lips, or tongue -breathing problems -changes in vision -chest pain -confusion, trouble speaking or understanding -feeling faint or lightheaded, falls -high blood pressure -muscle aches or pains -pain, swelling, warmth in the leg -rapid weight gain -severe headaches -sudden numbness or weakness of the face, arm or leg -trouble walking, dizziness, loss of balance or coordination -seizures (convulsions) -swelling of the ankles, feet, hands -unusually weak or tired Side effects that usually do not require medical attention (report to your doctor or health care professional if they continue or are bothersome): -diarrhea -fever, chills (flu-like symptoms) -headaches -nausea, vomiting -redness, stinging, or swelling at site where injected This list may not describe all possible side effects. Call your doctor for medical advice about side effects. You may report side effects to FDA at 1-800-FDA-1088. Where should I keep my medicine? Keep out of the reach of children. Store in a refrigerator between 2 and 8 degrees C (36 and 46 degrees F). Do not freeze. Do not shake. Throw away any unused portion if using a single-dose vial. Throw away any unused medicine after the expiration date. NOTE: This sheet is a summary. It may not cover all possible information. If you have questions about this medicine, talk to your doctor, pharmacist, or health care provider.  2013, [employer]. (10/19/2008 10:23:57 AM)

## 2013-09-19 LAB — TYPE AND SCREEN
ABO/RH(D): O POS
Antibody Screen: NEGATIVE
Unit division: 0

## 2013-09-23 ENCOUNTER — Ambulatory Visit (HOSPITAL_COMMUNITY)
Admission: RE | Admit: 2013-09-23 | Discharge: 2013-09-23 | Disposition: A | Discharge status: Home / Self Care | Payer: Medicare Other | Source: Ambulatory Visit | Attending: Oncology | Admitting: Oncology

## 2013-09-23 DIAGNOSIS — D649 Anemia, unspecified: Secondary | ICD-10-CM | POA: Insufficient documentation

## 2013-10-02 ENCOUNTER — Other Ambulatory Visit: Payer: Self-pay | Admitting: *Deleted

## 2013-10-02 ENCOUNTER — Other Ambulatory Visit (HOSPITAL_BASED_OUTPATIENT_CLINIC_OR_DEPARTMENT_OTHER): Payer: Medicare Other | Admitting: Lab

## 2013-10-02 ENCOUNTER — Ambulatory Visit: Payer: Medicare Other

## 2013-10-02 ENCOUNTER — Ambulatory Visit (HOSPITAL_BASED_OUTPATIENT_CLINIC_OR_DEPARTMENT_OTHER): Payer: Medicare Other

## 2013-10-02 DIAGNOSIS — D469 Myelodysplastic syndrome, unspecified: Secondary | ICD-10-CM

## 2013-10-02 DIAGNOSIS — D649 Anemia, unspecified: Secondary | ICD-10-CM

## 2013-10-02 DIAGNOSIS — D47Z9 Other specified neoplasms of uncertain behavior of lymphoid, hematopoietic and related tissue: Secondary | ICD-10-CM

## 2013-10-02 DIAGNOSIS — D539 Nutritional anemia, unspecified: Secondary | ICD-10-CM

## 2013-10-02 LAB — CBC WITH DIFFERENTIAL/PLATELET
BASO%: 2.4 % — ABNORMAL HIGH (ref 0.0–2.0)
EOS%: 2.4 % (ref 0.0–7.0)
HCT: 25.4 % — ABNORMAL LOW (ref 38.4–49.9)
LYMPH%: 32.1 % (ref 14.0–49.0)
MCH: 28.3 pg (ref 27.2–33.4)
MCHC: 31.5 g/dL — ABNORMAL LOW (ref 32.0–36.0)
MCV: 89.8 fL (ref 79.3–98.0)
MONO#: 0.2 10*3/uL (ref 0.1–0.9)
MONO%: 7.5 % (ref 0.0–14.0)
NEUT%: 55.6 % (ref 39.0–75.0)
Platelets: 127 10*3/uL — ABNORMAL LOW (ref 140–400)
RBC: 2.83 10*6/uL — ABNORMAL LOW (ref 4.20–5.82)
nRBC: 1 % — ABNORMAL HIGH (ref 0–0)

## 2013-10-02 MED ORDER — DARBEPOETIN ALFA-POLYSORBATE 300 MCG/0.6ML IJ SOLN
300.0000 ug | Freq: Once | INTRAMUSCULAR | Status: AC
  Administered 2013-10-02: 300 ug via SUBCUTANEOUS
  Filled 2013-10-02: qty 0.6

## 2013-10-02 NOTE — Progress Notes (Signed)
Injection given in infusion 

## 2013-10-02 NOTE — Progress Notes (Signed)
Transfusion cancelled.

## 2013-10-16 ENCOUNTER — Telehealth: Payer: Self-pay | Admitting: Oncology

## 2013-10-16 ENCOUNTER — Ambulatory Visit: Payer: Medicare Other

## 2013-10-16 ENCOUNTER — Other Ambulatory Visit (HOSPITAL_BASED_OUTPATIENT_CLINIC_OR_DEPARTMENT_OTHER): Payer: Medicare Other | Admitting: Lab

## 2013-10-16 ENCOUNTER — Encounter: Payer: Self-pay | Admitting: Oncology

## 2013-10-16 ENCOUNTER — Ambulatory Visit (HOSPITAL_BASED_OUTPATIENT_CLINIC_OR_DEPARTMENT_OTHER): Payer: Medicare Other | Admitting: Oncology

## 2013-10-16 ENCOUNTER — Ambulatory Visit (HOSPITAL_BASED_OUTPATIENT_CLINIC_OR_DEPARTMENT_OTHER): Payer: Medicare Other

## 2013-10-16 VITALS — BP 99/48 | HR 84 | Temp 97.7°F | Resp 20 | Ht 69.0 in | Wt 178.1 lb

## 2013-10-16 VITALS — BP 117/40 | HR 66 | Temp 98.4°F | Resp 18

## 2013-10-16 DIAGNOSIS — I10 Essential (primary) hypertension: Secondary | ICD-10-CM

## 2013-10-16 DIAGNOSIS — D539 Nutritional anemia, unspecified: Secondary | ICD-10-CM

## 2013-10-16 DIAGNOSIS — D47Z9 Other specified neoplasms of uncertain behavior of lymphoid, hematopoietic and related tissue: Secondary | ICD-10-CM

## 2013-10-16 DIAGNOSIS — D649 Anemia, unspecified: Secondary | ICD-10-CM

## 2013-10-16 DIAGNOSIS — D709 Neutropenia, unspecified: Secondary | ICD-10-CM

## 2013-10-16 DIAGNOSIS — D696 Thrombocytopenia, unspecified: Secondary | ICD-10-CM

## 2013-10-16 DIAGNOSIS — E119 Type 2 diabetes mellitus without complications: Secondary | ICD-10-CM

## 2013-10-16 LAB — CBC WITH DIFFERENTIAL/PLATELET
BASO%: 1.4 % (ref 0.0–2.0)
Basophils Absolute: 0 10*3/uL (ref 0.0–0.1)
EOS%: 0.9 % (ref 0.0–7.0)
HCT: 20 % — ABNORMAL LOW (ref 38.4–49.9)
MCH: 29.1 pg (ref 27.2–33.4)
MCHC: 32 g/dL (ref 32.0–36.0)
MCV: 90.9 fL (ref 79.3–98.0)
MONO#: 0.2 10*3/uL (ref 0.1–0.9)
MONO%: 7.5 % (ref 0.0–14.0)
RBC: 2.2 10*6/uL — ABNORMAL LOW (ref 4.20–5.82)
RDW: 23.9 % — ABNORMAL HIGH (ref 11.0–14.6)
WBC: 2.1 10*3/uL — ABNORMAL LOW (ref 4.0–10.3)
nRBC: 2 % — ABNORMAL HIGH (ref 0–0)

## 2013-10-16 LAB — HOLD TUBE, BLOOD BANK

## 2013-10-16 LAB — TECHNOLOGIST REVIEW

## 2013-10-16 MED ORDER — DARBEPOETIN ALFA-POLYSORBATE 300 MCG/0.6ML IJ SOLN
300.0000 ug | Freq: Once | INTRAMUSCULAR | Status: AC
  Administered 2013-10-16: 300 ug via SUBCUTANEOUS
  Filled 2013-10-16: qty 0.6

## 2013-10-16 MED ORDER — DIPHENHYDRAMINE HCL 25 MG PO CAPS
25.0000 mg | ORAL_CAPSULE | Freq: Once | ORAL | Status: AC
  Administered 2013-10-16: 25 mg via ORAL

## 2013-10-16 MED ORDER — ACETAMINOPHEN 325 MG PO TABS
650.0000 mg | ORAL_TABLET | Freq: Once | ORAL | Status: AC
  Administered 2013-10-16: 650 mg via ORAL

## 2013-10-16 MED ORDER — FUROSEMIDE 10 MG/ML IJ SOLN
20.0000 mg | Freq: Once | INTRAMUSCULAR | Status: AC
  Administered 2013-10-16: 20 mg via INTRAVENOUS

## 2013-10-16 MED ORDER — ACETAMINOPHEN 325 MG PO TABS
ORAL_TABLET | ORAL | Status: AC
  Filled 2013-10-16: qty 2

## 2013-10-16 MED ORDER — SODIUM CHLORIDE 0.9 % IV SOLN
250.0000 mL | Freq: Once | INTRAVENOUS | Status: AC
  Administered 2013-10-16: 250 mL via INTRAVENOUS

## 2013-10-16 MED ORDER — DIPHENHYDRAMINE HCL 25 MG PO CAPS
ORAL_CAPSULE | ORAL | Status: AC
  Filled 2013-10-16: qty 1

## 2013-10-16 NOTE — Patient Instructions (Signed)
Blood Transfusion Information WHAT IS A BLOOD TRANSFUSION? A transfusion is the replacement of blood or some of its parts. Blood is made up of multiple cells which provide different functions.  Red blood cells carry oxygen and are used for blood loss replacement.  White blood cells fight against infection.  Platelets control bleeding.  Plasma helps clot blood.  Other blood products are available for specialized needs, such as hemophilia or other clotting disorders. BEFORE THE TRANSFUSION  Who gives blood for transfusions?   You may be able to donate blood to be used at a later date on yourself (autologous donation).  Relatives can be asked to donate blood. This is generally not any safer than if you have received blood from a stranger. The same precautions are taken to ensure safety when a relative's blood is donated.  Healthy volunteers who are fully evaluated to make sure their blood is safe. This is blood bank blood. Transfusion therapy is the safest it has ever been in the practice of medicine. Before blood is taken from a donor, a complete history is taken to make sure that person has no history of diseases nor engages in risky social behavior (examples are intravenous drug use or sexual activity with multiple partners). The donor's travel history is screened to minimize risk of transmitting infections, such as malaria. The donated blood is tested for signs of infectious diseases, such as HIV and hepatitis. The blood is then tested to be sure it is compatible with you in order to minimize the chance of a transfusion reaction. If you or a relative donates blood, this is often done in anticipation of surgery and is not appropriate for emergency situations. It takes many days to process the donated blood. RISKS AND COMPLICATIONS Although transfusion therapy is very safe and saves many lives, the main dangers of transfusion include:   Getting an infectious disease.  Developing a  transfusion reaction. This is an allergic reaction to something in the blood you were given. Every precaution is taken to prevent this. The decision to have a blood transfusion has been considered carefully by your caregiver before blood is given. Blood is not given unless the benefits outweigh the risks. AFTER THE TRANSFUSION  Right after receiving a blood transfusion, you will usually feel much better and more energetic. This is especially true if your red blood cells have gotten low (anemic). The transfusion raises the level of the red blood cells which carry oxygen, and this usually causes an energy increase.  The nurse administering the transfusion will monitor you carefully for complications. HOME CARE INSTRUCTIONS  No special instructions are needed after a transfusion. You may find your energy is better. Speak with your caregiver about any limitations on activity for underlying diseases you may have. SEEK MEDICAL CARE IF:   Your condition is not improving after your transfusion.  You develop redness or irritation at the intravenous (IV) site. SEEK IMMEDIATE MEDICAL CARE IF:  Any of the following symptoms occur over the next 12 hours:  Shaking chills.  You have a temperature by mouth above 102 F (38.9 C), not controlled by medicine.  Chest, back, or muscle pain.  People around you feel you are not acting correctly or are confused.  Shortness of breath or difficulty breathing.  Dizziness and fainting.  You get a rash or develop hives.  You have a decrease in urine output.  Your urine turns a dark color or changes to pink, red, or brown. Any of the following   symptoms occur over the next 10 days:  You have a temperature by mouth above 102 F (38.9 C), not controlled by medicine.  Shortness of breath.  Weakness after normal activity.  The white part of the eye turns yellow (jaundice).  You have a decrease in the amount of urine or are urinating less often.  Your  urine turns a dark color or changes to pink, red, or brown. Document Released: 11/02/2000 Document Revised: 01/28/2012 Document Reviewed: 06/21/2008 [employer] Patient Information 2014 [employer].  

## 2013-10-16 NOTE — Progress Notes (Signed)
Injection given in infusion 

## 2013-10-16 NOTE — Telephone Encounter (Signed)
[facility] PT APPT SCHEDULE FOR DECEMBER 2014 AND JANUARY 2015

## 2013-10-16 NOTE — Progress Notes (Signed)
Hematology and Oncology Follow Up Visit  Kyle Heath [id number] 01-10-1927 79 y.o. 10/16/2013 10:32 AM    Principle Diagnosis: 79 year old gentleman with diagnosis of pancytopenia and more specifically severe anemia likely due to myelofibrosis. He he might have an element of myelodysplastic syndrome as well. Bone marrow biopsy in 01/2012 confirmed the diagnosis.   Current therapy:  Supportive transfusions every 2-4 weeks as needed  Interim History:  Kyle Heath presents today in the infusion room for a routine visit with his daughter. He is a pleasant 39 year old gentleman who presented with a severe anemia and the above diagnosis. He did have a bone marrow biopsy performed on 02/11/2012 that showed an extensive fibrosis indicating a myeloproliferative disorder more specifically myelofibrosis. He continues to be transfusion dependent but remains functional at this time. He continues to ambulate without the aid of a walker or difficulty. Able to toilet himself and dress himself. Fatigue is stable. Denies chest pain, shortness of breath, and dyspnea.  Appetite and weight are stable. No new complications at this time. He is not reporting any recent hospitalization or emesis. Is not reporting any recent bleeding or infections.  Medications: I have reviewed the patient's current medications. Current Outpatient Prescriptions  Medication Sig Dispense Refill  . aspirin 81 MG tablet Take 81 mg by mouth daily after breakfast.       . carvedilol (COREG) 25 MG tablet Take 1 tablet (25 mg total) by mouth 2 (two) times daily with a meal.  60 tablet  0  . diltiazem (CARDIZEM CD) 360 MG 24 hr capsule Take 1 capsule (360 mg total) by mouth daily.  30 capsule  2  . febuxostat (ULORIC) 40 MG tablet Take 40 mg by mouth daily.      . furosemide (LASIX) 40 MG tablet Take 1 tablet (40 mg total) by mouth daily.  30 tablet  1  . levothyroxine (SYNTHROID, LEVOTHROID) 88 MCG tablet Take 88 mcg by mouth daily before  breakfast.      . losartan (COZAAR) 100 MG tablet Take 50 mg by mouth daily.       . Multiple Vitamin (MULTIVITAMIN WITH MINERALS) TABS Take 1 tablet by mouth daily.      . potassium chloride SA (K-DUR,KLOR-CON) 20 MEQ tablet Take 1 tablet (20 mEq total) by mouth daily.  30 tablet  1  . TAZTIA XT 360 MG 24 hr capsule Take 360 mg by mouth daily.       No current facility-administered medications for this visit.   Facility-Administered Medications Ordered in Other Visits  Medication Dose Route Frequency Provider Last Rate Last Dose  . 0.9 %  sodium chloride infusion  250 mL Intravenous Once Myrtis Ser, NP      . acetaminophen (TYLENOL) tablet 650 mg  650 mg Oral Once Myrtis Ser, NP      . diphenhydrAMINE (BENADRYL) capsule 25 mg  25 mg Oral Once Myrtis Ser, NP      . furosemide (LASIX) injection 20 mg  20 mg Intravenous Once Myrtis Ser, NP      . furosemide (LASIX) injection 20 mg  20 mg Intravenous Once Myrtis Ser, NP         Allergies: No Known Allergies  Past Medical History, Surgical history, Social history, and Family History were reviewed and updated.  Review of Systems:  Remaining ROS negative.  Physical Exam: Blood pressure 99/48, pulse 84, temperature 97.7 F (36.5 C), temperature source Oral, resp. rate 20, height 5\' 9"  (  1.753 m), weight 178 lb 1.6 oz (80.786 kg). ECOG: 2 General appearance: alert. Pleasantly confused. Head: Normocephalic, without obvious abnormality, atraumatic Neck: no adenopathy, no carotid bruit, no JVD, supple, symmetrical, trachea midline and thyroid not enlarged, symmetric, no tenderness/mass/nodules Lymph nodes: Cervical, supraclavicular, and axillary nodes normal. Heart:regular rate and rhythm, S1, S2 normal, no murmur, click, rub or gallop Lung:chest clear, no wheezing, rales, normal symmetric air entry Abdomen: soft, non-tender, without masses or organomegaly EXT:no erythema, induration, or nodules. 1+ edema  bilaterally..   Lab Results:  CBC    Component Value Date/Time   WBC 2.1* 10/16/2013 0921   WBC 2.3* 01/21/2013 1431   RBC 2.20* 10/16/2013 0921   RBC 3.12* 01/21/2013 1431   HGB 6.4* 10/16/2013 0921   HGB 9.0* 01/21/2013 1431   HCT 20.0* 10/16/2013 0921   HCT 27.4* 01/21/2013 1431   PLT 135* 10/16/2013 0921   PLT 217 01/21/2013 1431   MCV 90.9 10/16/2013 0921   MCV 87.8 01/21/2013 1431   MCH 29.1 10/16/2013 0921   MCH 28.8 01/21/2013 1431   MCHC 32.0 10/16/2013 0921   MCHC 32.8 01/21/2013 1431   RDW 23.9* 10/16/2013 0921   RDW 20.3* 01/21/2013 1431   LYMPHSABS 0.8* 10/16/2013 0921   MONOABS 0.2 10/16/2013 0921   EOSABS 0.0 10/16/2013 0921   BASOSABS 0.0 10/16/2013 0921      Impression and Plan:  79 year old gentleman with the following issues:  1. 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 He could also have an element of myelodysplastic syndrome as well. He is currently on supportive care only. He does not have abdominal pain or a large spleen. Plan to check CBC every 2 weeks and  transfuse when symptomatic from his anemia. His hemoglobin is low today at 6.4. Will given 2 units PRBCs and Aranesp today. His overall prognosis is very poor. I have explained to the patient and his daughter that his prognosis is poor and he is not a candidate for any aggressive treatment.   2. Neutropenia: Stable.  3. Thrombocytopenia : Stable. No active bleeding. No transfusion indicated.   4. HTN: On Cardizem and Cozaar per PCP.  5. DM: Diet-controlled. He is followed by PCP.  6. Followup: Every 2 weeks for lab, injection, and possible transfusion and he will be seen for a visit in 6 weeks.     Tiffinie Caillier 11/28/201410:32 AM

## 2013-10-17 LAB — TYPE AND SCREEN
ABO/RH(D): O POS
Antibody Screen: NEGATIVE
Unit division: 0
Unit division: 0

## 2013-10-19 ENCOUNTER — Ambulatory Visit (HOSPITAL_COMMUNITY)
Admission: RE | Admit: 2013-10-19 | Discharge: 2013-10-19 | Disposition: A | Discharge status: Home / Self Care | Payer: Medicare Other | Source: Ambulatory Visit | Attending: Oncology | Admitting: Oncology

## 2013-10-19 DIAGNOSIS — D649 Anemia, unspecified: Secondary | ICD-10-CM | POA: Insufficient documentation

## 2013-10-30 ENCOUNTER — Ambulatory Visit (HOSPITAL_BASED_OUTPATIENT_CLINIC_OR_DEPARTMENT_OTHER): Payer: Medicare Other

## 2013-10-30 ENCOUNTER — Other Ambulatory Visit (HOSPITAL_BASED_OUTPATIENT_CLINIC_OR_DEPARTMENT_OTHER): Payer: Medicare Other

## 2013-10-30 ENCOUNTER — Ambulatory Visit: Payer: Medicare Other

## 2013-10-30 ENCOUNTER — Other Ambulatory Visit: Payer: Self-pay | Admitting: *Deleted

## 2013-10-30 VITALS — BP 124/57 | HR 66 | Temp 98.0°F | Resp 16

## 2013-10-30 DIAGNOSIS — D539 Nutritional anemia, unspecified: Secondary | ICD-10-CM

## 2013-10-30 DIAGNOSIS — D469 Myelodysplastic syndrome, unspecified: Secondary | ICD-10-CM

## 2013-10-30 DIAGNOSIS — D649 Anemia, unspecified: Secondary | ICD-10-CM

## 2013-10-30 LAB — CBC WITH DIFFERENTIAL/PLATELET
Basophils Absolute: 0 10*3/uL (ref 0.0–0.1)
EOS%: 1.5 % (ref 0.0–7.0)
Eosinophils Absolute: 0 10*3/uL (ref 0.0–0.5)
HCT: 20.7 % — ABNORMAL LOW (ref 38.4–49.9)
HGB: 6.9 g/dL — CL (ref 13.0–17.1)
MCH: 30.3 pg (ref 27.2–33.4)
MCV: 90.6 fL (ref 79.3–98.0)
MONO%: 6.6 % (ref 0.0–14.0)
NEUT#: 1.3 10*3/uL — ABNORMAL LOW (ref 1.5–6.5)
NEUT%: 62.7 % (ref 39.0–75.0)
Platelets: 91 10*3/uL — ABNORMAL LOW (ref 140–400)
RDW: 22.5 % — ABNORMAL HIGH (ref 11.0–14.6)

## 2013-10-30 LAB — PREPARE RBC (CROSSMATCH)

## 2013-10-30 LAB — TECHNOLOGIST REVIEW

## 2013-10-30 MED ORDER — DARBEPOETIN ALFA-POLYSORBATE 300 MCG/0.6ML IJ SOLN
300.0000 ug | Freq: Once | INTRAMUSCULAR | Status: AC
  Administered 2013-10-30: 300 ug via SUBCUTANEOUS
  Filled 2013-10-30: qty 0.6

## 2013-10-30 MED ORDER — SODIUM CHLORIDE 0.9 % IV SOLN
250.0000 mL | Freq: Once | INTRAVENOUS | Status: AC
  Administered 2013-10-30: 250 mL via INTRAVENOUS

## 2013-10-30 MED ORDER — DIPHENHYDRAMINE HCL 25 MG PO CAPS
ORAL_CAPSULE | ORAL | Status: AC
  Filled 2013-10-30: qty 1

## 2013-10-30 MED ORDER — ACETAMINOPHEN 325 MG PO TABS
650.0000 mg | ORAL_TABLET | Freq: Once | ORAL | Status: AC
  Administered 2013-10-30: 650 mg via ORAL

## 2013-10-30 MED ORDER — DIPHENHYDRAMINE HCL 25 MG PO CAPS
25.0000 mg | ORAL_CAPSULE | Freq: Once | ORAL | Status: AC
  Administered 2013-10-30: 25 mg via ORAL

## 2013-10-30 MED ORDER — ACETAMINOPHEN 325 MG PO TABS
ORAL_TABLET | ORAL | Status: AC
  Filled 2013-10-30: qty 2

## 2013-10-30 NOTE — Patient Instructions (Addendum)
Blood Transfusion Information WHAT IS A BLOOD TRANSFUSION? A transfusion is the replacement of blood or some of its parts. Blood is made up of multiple cells which provide different functions.  Red blood cells carry oxygen and are used for blood loss replacement.  White blood cells fight against infection.  Platelets control bleeding.  Plasma helps clot blood.  Other blood products are available for specialized needs, such as hemophilia or other clotting disorders. BEFORE THE TRANSFUSION  Who gives blood for transfusions?   You may be able to donate blood to be used at a later date on yourself (autologous donation).  Relatives can be asked to donate blood. This is generally not any safer than if you have received blood from a stranger. The same precautions are taken to ensure safety when a relative's blood is donated.  Healthy volunteers who are fully evaluated to make sure their blood is safe. This is blood bank blood. Transfusion therapy is the safest it has ever been in the practice of medicine. Before blood is taken from a donor, a complete history is taken to make sure that person has no history of diseases nor engages in risky social behavior (examples are intravenous drug use or sexual activity with multiple partners). The donor's travel history is screened to minimize risk of transmitting infections, such as malaria. The donated blood is tested for signs of infectious diseases, such as HIV and hepatitis. The blood is then tested to be sure it is compatible with you in order to minimize the chance of a transfusion reaction. If you or a relative donates blood, this is often done in anticipation of surgery and is not appropriate for emergency situations. It takes many days to process the donated blood. RISKS AND COMPLICATIONS Although transfusion therapy is very safe and saves many lives, the main dangers of transfusion include:   Getting an infectious disease.  Developing a  transfusion reaction. This is an allergic reaction to something in the blood you were given. Every precaution is taken to prevent this. The decision to have a blood transfusion has been considered carefully by your caregiver before blood is given. Blood is not given unless the benefits outweigh the risks. AFTER THE TRANSFUSION  Right after receiving a blood transfusion, you will usually feel much better and more energetic. This is especially true if your red blood cells have gotten low (anemic). The transfusion raises the level of the red blood cells which carry oxygen, and this usually causes an energy increase.  The nurse administering the transfusion will monitor you carefully for complications. HOME CARE INSTRUCTIONS  No special instructions are needed after a transfusion. You may find your energy is better. Speak with your caregiver about any limitations on activity for underlying diseases you may have. SEEK MEDICAL CARE IF:   Your condition is not improving after your transfusion.  You develop redness or irritation at the intravenous (IV) site. SEEK IMMEDIATE MEDICAL CARE IF:  Any of the following symptoms occur over the next 12 hours:  Shaking chills.  You have a temperature by mouth above 102 F (38.9 C), not controlled by medicine.  Chest, back, or muscle pain.  People around you feel you are not acting correctly or are confused.  Shortness of breath or difficulty breathing.  Dizziness and fainting.  You get a rash or develop hives.  You have a decrease in urine output.  Your urine turns a dark color or changes to pink, red, or brown. Any of the following   symptoms occur over the next 10 days:  You have a temperature by mouth above 102 F (38.9 C), not controlled by medicine.  Shortness of breath.  Weakness after normal activity.  The white part of the eye turns yellow (jaundice).  You have a decrease in the amount of urine or are urinating less often.  Your  urine turns a dark color or changes to pink, red, or brown. Document Released: 11/02/2000 Document Revised: 01/28/2012 Document Reviewed: 06/21/2008 Exit[employer] Patient Information 2014 [employer].   Darbepoetin Alfa injection What is this medicine? DARBEPOETIN ALFA (dar be POE e tin AL fa) helps your body make more red blood cells. It is used to treat anemia caused by chronic kidney failure and chemotherapy. This medicine may be used for other purposes; ask your health care provider or pharmacist if you have questions. COMMON BRAND NAME(S): Aranesp What should I tell my health care provider before I take this medicine? They need to know if you have any of these conditions: -blood clotting disorders or history of blood clots -cancer patient not on chemotherapy -cystic fibrosis -heart disease, such as angina, heart failure, or a history of a heart attack -hemoglobin level of 12 g/dL or greater -high blood pressure -low levels of folate, iron, or vitamin B12 -seizures -an unusual or allergic reaction to darbepoetin, erythropoietin, albumin, hamster proteins, latex, other medicines, foods, dyes, or preservatives -pregnant or trying to get pregnant -breast-feeding How should I use this medicine? This medicine is for injection into a vein or under the skin. It is usually given by a health care professional in a hospital or clinic setting. If you get this medicine at home, you will be taught how to prepare and give this medicine. Do not shake the solution before you withdraw a dose. Use exactly as directed. Take your medicine at regular intervals. Do not take your medicine more often than directed. It is important that you put your used needles and syringes in a special sharps container. Do not put them in a trash can. If you do not have a sharps container, call your pharmacist or healthcare provider to get one. Talk to your pediatrician regarding the use of this medicine in children. While  this medicine may be used in children as young as 1 year for selected conditions, precautions do apply. Overdosage: If you think you have taken too much of this medicine contact a poison control center or emergency room at once. NOTE: This medicine is only for you. Do not share this medicine with others. What if I miss a dose? If you miss a dose, take it as soon as you can. If it is almost time for your next dose, take only that dose. Do not take double or extra doses. What may interact with this medicine? Do not take this medicine with any of the following medications: -epoetin alfa This list may not describe all possible interactions. Give your health care provider a list of all the medicines, herbs, non-prescription drugs, or dietary supplements you use. Also tell them if you smoke, drink alcohol, or use illegal drugs. Some items may interact with your medicine. What should I watch for while using this medicine? Visit your prescriber or health care professional for regular checks on your progress and for the needed blood tests and blood pressure measurements. It is especially important for the doctor to make sure your hemoglobin level is in the desired range, to limit the risk of potential side effects and to give you   the best benefit. Keep all appointments for any recommended tests. Check your blood pressure as directed. Ask your doctor what your blood pressure should be and when you should contact him or her. As your body makes more red blood cells, you may need to take iron, folic acid, or vitamin B supplements. Ask your doctor or health care provider which products are right for you. If you have kidney disease continue dietary restrictions, even though this medication can make you feel better. Talk with your doctor or health care professional about the foods you eat and the vitamins that you take. What side effects may I notice from receiving this medicine? Side effects that you should report to  your doctor or health care professional as soon as possible: -allergic reactions like skin rash, itching or hives, swelling of the face, lips, or tongue -breathing problems -changes in vision -chest pain -confusion, trouble speaking or understanding -feeling faint or lightheaded, falls -high blood pressure -muscle aches or pains -pain, swelling, warmth in the leg -rapid weight gain -severe headaches -sudden numbness or weakness of the face, arm or leg -trouble walking, dizziness, loss of balance or coordination -seizures (convulsions) -swelling of the ankles, feet, hands -unusually weak or tired Side effects that usually do not require medical attention (report to your doctor or health care professional if they continue or are bothersome): -diarrhea -fever, chills (flu-like symptoms) -headaches -nausea, vomiting -redness, stinging, or swelling at site where injected This list may not describe all possible side effects. Call your doctor for medical advice about side effects. You may report side effects to FDA at 1-800-FDA-1088. Where should I keep my medicine? Keep out of the reach of children. Store in a refrigerator between 2 and 8 degrees C (36 and 46 degrees F). Do not freeze. Do not shake. Throw away any unused portion if using a single-dose vial. Throw away any unused medicine after the expiration date. NOTE: This sheet is a summary. It may not cover all possible information. If you have questions about this medicine, talk to your doctor, pharmacist, or health care provider.  2014, [employer]. (2008-10-19 10:23:57)  

## 2013-10-30 NOTE — Progress Notes (Signed)
Injection given in infusion 

## 2013-11-01 LAB — TYPE AND SCREEN
ABO/RH(D): O POS
Antibody Screen: NEGATIVE
Unit division: 0
Unit division: 0

## 2013-11-13 ENCOUNTER — Other Ambulatory Visit (HOSPITAL_BASED_OUTPATIENT_CLINIC_OR_DEPARTMENT_OTHER): Payer: Medicare Other

## 2013-11-13 ENCOUNTER — Telehealth: Payer: Self-pay | Admitting: *Deleted

## 2013-11-13 ENCOUNTER — Ambulatory Visit: Payer: Medicare Other

## 2013-11-13 ENCOUNTER — Ambulatory Visit (HOSPITAL_BASED_OUTPATIENT_CLINIC_OR_DEPARTMENT_OTHER): Payer: Medicare Other

## 2013-11-13 ENCOUNTER — Other Ambulatory Visit: Payer: Self-pay | Admitting: Medical Oncology

## 2013-11-13 VITALS — BP 120/49 | HR 85 | Temp 98.3°F

## 2013-11-13 DIAGNOSIS — D649 Anemia, unspecified: Secondary | ICD-10-CM

## 2013-11-13 DIAGNOSIS — D47Z9 Other specified neoplasms of uncertain behavior of lymphoid, hematopoietic and related tissue: Secondary | ICD-10-CM

## 2013-11-13 LAB — CBC WITH DIFFERENTIAL/PLATELET
BASO%: 1.4 % (ref 0.0–2.0)
EOS%: 1.8 % (ref 0.0–7.0)
HGB: 7.9 g/dL — ABNORMAL LOW (ref 13.0–17.1)
MCH: 28.7 pg (ref 27.2–33.4)
MCHC: 31.3 g/dL — ABNORMAL LOW (ref 32.0–36.0)
MCV: 91.6 fL (ref 79.3–98.0)
MONO#: 0.2 10*3/uL (ref 0.1–0.9)
MONO%: 10 % (ref 0.0–14.0)
NEUT#: 1.3 10*3/uL — ABNORMAL LOW (ref 1.5–6.5)
NEUT%: 60.4 % (ref 39.0–75.0)
RBC: 2.75 10*6/uL — ABNORMAL LOW (ref 4.20–5.82)
RDW: 22 % — ABNORMAL HIGH (ref 11.0–14.6)
lymph#: 0.6 10*3/uL — ABNORMAL LOW (ref 0.9–3.3)
nRBC: 1 % — ABNORMAL HIGH (ref 0–0)

## 2013-11-13 LAB — TECHNOLOGIST REVIEW: Technologist Review: 4

## 2013-11-13 LAB — HOLD TUBE, BLOOD BANK

## 2013-11-13 MED ORDER — DARBEPOETIN ALFA-POLYSORBATE 300 MCG/0.6ML IJ SOLN
300.0000 ug | Freq: Once | INTRAMUSCULAR | Status: AC
  Administered 2013-11-13: 300 ug via SUBCUTANEOUS
  Filled 2013-11-13: qty 0.6

## 2013-11-13 NOTE — Progress Notes (Signed)
Patient's HGB today is 7.9. Patient stated, "I don't feel like I need blood today. " Patient denied CP and SOB. Injection nurse to give him the Aranesp shot. Patient and daughter verbalized understanding.

## 2013-11-20 ENCOUNTER — Ambulatory Visit (HOSPITAL_COMMUNITY)
Admission: RE | Admit: 2013-11-20 | Discharge: 2013-11-20 | Disposition: A | Discharge status: Home / Self Care | Payer: Medicare Other | Source: Ambulatory Visit | Attending: Oncology | Admitting: Oncology

## 2013-11-20 DIAGNOSIS — D649 Anemia, unspecified: Secondary | ICD-10-CM | POA: Insufficient documentation

## 2013-11-27 ENCOUNTER — Ambulatory Visit (HOSPITAL_BASED_OUTPATIENT_CLINIC_OR_DEPARTMENT_OTHER): Payer: Medicare Other | Admitting: Oncology

## 2013-11-27 ENCOUNTER — Ambulatory Visit (HOSPITAL_BASED_OUTPATIENT_CLINIC_OR_DEPARTMENT_OTHER): Payer: Medicare Other

## 2013-11-27 ENCOUNTER — Telehealth: Payer: Self-pay | Admitting: Oncology

## 2013-11-27 ENCOUNTER — Encounter: Payer: Self-pay | Admitting: Oncology

## 2013-11-27 ENCOUNTER — Other Ambulatory Visit (HOSPITAL_BASED_OUTPATIENT_CLINIC_OR_DEPARTMENT_OTHER): Payer: Medicare Other

## 2013-11-27 VITALS — BP 146/63 | HR 74 | Temp 97.4°F | Resp 18 | Ht 69.0 in | Wt 179.6 lb

## 2013-11-27 VITALS — BP 128/42 | HR 68 | Temp 98.5°F | Resp 18

## 2013-11-27 DIAGNOSIS — D7581 Myelofibrosis: Secondary | ICD-10-CM

## 2013-11-27 DIAGNOSIS — D649 Anemia, unspecified: Secondary | ICD-10-CM

## 2013-11-27 DIAGNOSIS — I10 Essential (primary) hypertension: Secondary | ICD-10-CM

## 2013-11-27 DIAGNOSIS — D709 Neutropenia, unspecified: Secondary | ICD-10-CM

## 2013-11-27 LAB — CBC WITH DIFFERENTIAL/PLATELET
BASO%: 1.8 % (ref 0.0–2.0)
Basophils Absolute: 0 10e3/uL (ref 0.0–0.1)
EOS%: 1.8 % (ref 0.0–7.0)
Eosinophils Absolute: 0 10e3/uL (ref 0.0–0.5)
HCT: 19.5 % — ABNORMAL LOW (ref 38.4–49.9)
HGB: 6 g/dL — CL (ref 13.0–17.1)
LYMPH%: 26.8 % (ref 14.0–49.0)
MCH: 28.4 pg (ref 27.2–33.4)
MCHC: 30.8 g/dL — ABNORMAL LOW (ref 32.0–36.0)
MCV: 92.4 fL (ref 79.3–98.0)
MONO#: 0.2 10e3/uL (ref 0.1–0.9)
MONO%: 7.1 % (ref 0.0–14.0)
NEUT#: 1.4 10e3/uL — ABNORMAL LOW (ref 1.5–6.5)
NEUT%: 62.5 % (ref 39.0–75.0)
Platelets: 142 10e3/uL (ref 140–400)
RBC: 2.11 10e6/uL — ABNORMAL LOW (ref 4.20–5.82)
RDW: 24.4 % — ABNORMAL HIGH (ref 11.0–14.6)
WBC: 2.2 10e3/uL — ABNORMAL LOW (ref 4.0–10.3)
lymph#: 0.6 10e3/uL — ABNORMAL LOW (ref 0.9–3.3)
nRBC: 2 % — ABNORMAL HIGH (ref 0–0)

## 2013-11-27 LAB — TECHNOLOGIST REVIEW: Technologist Review: 3

## 2013-11-27 LAB — HOLD TUBE, BLOOD BANK

## 2013-11-27 LAB — PREPARE RBC (CROSSMATCH)

## 2013-11-27 MED ORDER — SODIUM CHLORIDE 0.9 % IV SOLN
250.0000 mL | Freq: Once | INTRAVENOUS | Status: AC
  Administered 2013-11-27: 250 mL via INTRAVENOUS

## 2013-11-27 MED ORDER — DIPHENHYDRAMINE HCL 25 MG PO CAPS
25.0000 mg | ORAL_CAPSULE | Freq: Once | ORAL | Status: AC
  Administered 2013-11-27: 25 mg via ORAL

## 2013-11-27 MED ORDER — DARBEPOETIN ALFA-POLYSORBATE 300 MCG/0.6ML IJ SOLN
300.0000 ug | Freq: Once | INTRAMUSCULAR | Status: AC
  Administered 2013-11-27: 300 ug via SUBCUTANEOUS
  Filled 2013-11-27: qty 0.6

## 2013-11-27 MED ORDER — ACETAMINOPHEN 325 MG PO TABS
650.0000 mg | ORAL_TABLET | Freq: Once | ORAL | Status: AC
  Administered 2013-11-27: 650 mg via ORAL

## 2013-11-27 MED ORDER — DIPHENHYDRAMINE HCL 25 MG PO CAPS
ORAL_CAPSULE | ORAL | Status: AC
  Filled 2013-11-27: qty 1

## 2013-11-27 MED ORDER — ACETAMINOPHEN 325 MG PO TABS
ORAL_TABLET | ORAL | Status: AC
  Filled 2013-11-27: qty 2

## 2013-11-27 NOTE — Progress Notes (Signed)
Hematology and Oncology Follow Up Visit  Kyle Heath [id number] 03/23/1927 80 y.o. 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 He he might have an element of myelodysplastic syndrome as well. Bone marrow biopsy in 01/2012 confirmed the diagnosis.   Current therapy:  Supportive transfusions every 2-4 weeks as needed and Aransep 300 mcg every two weeks.   Interim History:  Kyle Heath presents today in the infusion room for a routine visit with his daughter. He is a pleasant 80-year-old gentleman who presented with a severe anemia and the above diagnosis. He did have a bone marrow biopsy performed on 02/11/2012 that showed an extensive fibrosis indicating a myeloproliferative disorder more specifically myelofibrosis. He continues to be transfusion dependent but remains functional at this time. He continues to ambulate without the aid of a walker or difficulty. Able to toilet himself and dress himself. Fatigue is stable. Denies chest pain, shortness of breath, and dyspnea.  Appetite and weight are stable. No new complications at this time. He is not reporting any recent hospitalization or emesis. Is not reporting any recent bleeding or infections. He reported no bleeding complications.   Medications: I have reviewed the patient's current medications. Current Outpatient Prescriptions  Medication Sig Dispense Refill  . aspirin 81 MG tablet Take 81 mg by mouth daily after breakfast.       . febuxostat (ULORIC) 40 MG tablet Take 40 mg by mouth daily.      . levothyroxine (SYNTHROID, LEVOTHROID) 88 MCG tablet Take 88 mcg by mouth daily before breakfast.      . losartan (COZAAR) 100 MG tablet Take 50 mg by mouth daily.       . Multiple Vitamin (MULTIVITAMIN WITH MINERALS) TABS Take 1 tablet by mouth daily.      . potassium chloride SA (K-DUR,KLOR-CON) 20 MEQ tablet Take 1 tablet (20 mEq total)  by mouth daily.  30 tablet  1  . TAZTIA XT 360 MG 24 hr capsule Take 360 mg by mouth daily.      . carvedilol (COREG) 25 MG tablet Take 1 tablet (25 mg total) by mouth 2 (two) times daily with a meal.  60 tablet  0  . diltiazem (CARDIZEM CD) 360 MG 24 hr capsule Take 1 capsule (360 mg total) by mouth daily.  30 capsule  2  . furosemide (LASIX) 40 MG tablet Take 1 tablet (40 mg total) by mouth daily.  30 tablet  1   No current facility-administered medications for this visit.   Facility-Administered Medications Ordered in Other Visits  Medication Dose Route Frequency Provider Last Rate Last Dose  . darbepoetin (ARANESP) injection 300 mcg  300 mcg Subcutaneous Once Kristin R Curcio, NP      . furosemide (LASIX) injection 20 mg  20 mg Intravenous Once Kristin R Curcio, NP         Allergies: No Known Allergies  Past Medical History, Surgical history, Social history, and Family History were reviewed and updated.  Review of Systems:  Remaining ROS negative.  Physical Exam: Blood pressure 146/63, pulse 74, temperature 97.4 F (36.3 C), temperature source Oral, resp. rate 18, height 5' 9" (1.753 m), weight 179 lb 9.6 oz (81.466 kg), SpO2 100.00%. ECOG: 2 General appearance: alert. Pleasantly confused. Head: Normocephalic, without obvious abnormality, atraumatic Neck: no adenopathy, no carotid bruit, no JVD, supple, symmetrical, trachea midline and thyroid not enlarged, symmetric, no tenderness/mass/nodules Lymph nodes: Cervical, supraclavicular,   and axillary nodes normal. Heart:regular rate and rhythm, S1, S2 normal, no murmur, click, rub or gallop Lung:chest clear, no wheezing, rales, normal symmetric air entry Abdomen: soft, non-tender, without masses or organomegaly EXT:no erythema, induration, or nodules. 1+ edema bilaterally..   Lab Results:  CBC    Component Value Date/Time   WBC 2.2* 11/27/2013 0819   WBC 2.3* 01/21/2013 1431   RBC 2.11* 11/27/2013 0819   RBC 3.12* 01/21/2013 1431    HGB 6.0* 11/27/2013 0819   HGB 9.0* 01/21/2013 1431   HCT 19.5* 11/27/2013 0819   HCT 27.4* 01/21/2013 1431   PLT 142 Large platelets present 11/27/2013 0819   PLT 217 01/21/2013 1431   MCV 92.4 11/27/2013 0819   MCV 87.8 01/21/2013 1431   MCH 28.4 11/27/2013 0819   MCH 28.8 01/21/2013 1431   MCHC 30.8* 11/27/2013 0819   MCHC 32.8 01/21/2013 1431   RDW 24.4* 11/27/2013 0819   RDW 20.3* 01/21/2013 1431   LYMPHSABS 0.6* 11/27/2013 0819   MONOABS 0.2 11/27/2013 0819   EOSABS 0.0 11/27/2013 0819   BASOSABS 0.0 11/27/2013 0819      Impression and Plan:  80-year-old gentleman with the following issues:  1. 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 He could also have an element of myelodysplastic syndrome as well. He is currently on supportive care only. He does not have abdominal pain or a large spleen. Plan to check CBC every 2 weeks and  transfuse when symptomatic from his anemia. His hemoglobin is low today at 6.0. Will given 2 units PRBCs and Aranesp today. His overall prognosis is very poor. I have explained to the patient and his daughter that his prognosis is poor and he is not a candidate for any aggressive treatment.   2. Neutropenia: Stable.  3. Thrombocytopenia : Stable. No active bleeding. No transfusion indicated.   4. HTN: On Cardizem and Cozaar per PCP.  5. IV Access: I discussed with him the role of a Port-A-Cath insertion in his considering it for the time being.  6. Followup: Every 2 weeks for lab, injection, and possible transfusion and he will be seen for a visit in 8 weeks.     , 1/9/20158:51 AM      

## 2013-11-27 NOTE — Patient Instructions (Signed)
Blood Transfusion Information WHAT IS A BLOOD TRANSFUSION? A transfusion is the replacement of blood or some of its parts. Blood is made up of multiple cells which provide different functions.  Red blood cells carry oxygen and are used for blood loss replacement.  White blood cells fight against infection.  Platelets control bleeding.  Plasma helps clot blood.  Other blood products are available for specialized needs, such as hemophilia or other clotting disorders. BEFORE THE TRANSFUSION  Who gives blood for transfusions?   You may be able to donate blood to be used at a later date on yourself (autologous donation).  Relatives can be asked to donate blood. This is generally not any safer than if you have received blood from a stranger. The same precautions are taken to ensure safety when a relative's blood is donated.  Healthy volunteers who are fully evaluated to make sure their blood is safe. This is blood bank blood. Transfusion therapy is the safest it has ever been in the practice of medicine. Before blood is taken from a donor, a complete history is taken to make sure that person has no history of diseases nor engages in risky social behavior (examples are intravenous drug use or sexual activity with multiple partners). The donor's travel history is screened to minimize risk of transmitting infections, such as malaria. The donated blood is tested for signs of infectious diseases, such as HIV and hepatitis. The blood is then tested to be sure it is compatible with you in order to minimize the chance of a transfusion reaction. If you or a relative donates blood, this is often done in anticipation of surgery and is not appropriate for emergency situations. It takes many days to process the donated blood. RISKS AND COMPLICATIONS Although transfusion therapy is very safe and saves many lives, the main dangers of transfusion include:   Getting an infectious disease.  Developing a  transfusion reaction. This is an allergic reaction to something in the blood you were given. Every precaution is taken to prevent this. The decision to have a blood transfusion has been considered carefully by your caregiver before blood is given. Blood is not given unless the benefits outweigh the risks. AFTER THE TRANSFUSION  Right after receiving a blood transfusion, you will usually feel much better and more energetic. This is especially true if your red blood cells have gotten low (anemic). The transfusion raises the level of the red blood cells which carry oxygen, and this usually causes an energy increase.  The nurse administering the transfusion will monitor you carefully for complications. HOME CARE INSTRUCTIONS  No special instructions are needed after a transfusion. You may find your energy is better. Speak with your caregiver about any limitations on activity for underlying diseases you may have. SEEK MEDICAL CARE IF:   Your condition is not improving after your transfusion.  You develop redness or irritation at the intravenous (IV) site. SEEK IMMEDIATE MEDICAL CARE IF:  Any of the following symptoms occur over the next 12 hours:  Shaking chills.  You have a temperature by mouth above 102 F (38.9 C), not controlled by medicine.  Chest, back, or muscle pain.  People around you feel you are not acting correctly or are confused.  Shortness of breath or difficulty breathing.  Dizziness and fainting.  You get a rash or develop hives.  You have a decrease in urine output.  Your urine turns a dark color or changes to pink, red, or brown. Any of the following   symptoms occur over the next 10 days:  You have a temperature by mouth above 102 F (38.9 C), not controlled by medicine.  Shortness of breath.  Weakness after normal activity.  The white part of the eye turns yellow (jaundice).  You have a decrease in the amount of urine or are urinating less often.  Your  urine turns a dark color or changes to pink, red, or brown. Document Released: 11/02/2000 Document Revised: 01/28/2012 Document Reviewed: 06/21/2008 [employer] Patient Information 2014 [employer].  

## 2013-11-27 NOTE — Telephone Encounter (Signed)
gv and printed appt sched and avs for pt for Jan thru March 2015....[name] Kitchensed added tx.

## 2013-11-28 LAB — TYPE AND SCREEN
ABO/RH(D): O POS
Antibody Screen: NEGATIVE
UNIT DIVISION: 0
Unit division: 0

## 2013-12-11 ENCOUNTER — Ambulatory Visit (HOSPITAL_BASED_OUTPATIENT_CLINIC_OR_DEPARTMENT_OTHER): Payer: Medicare Other

## 2013-12-11 ENCOUNTER — Other Ambulatory Visit (HOSPITAL_BASED_OUTPATIENT_CLINIC_OR_DEPARTMENT_OTHER): Payer: Medicare Other

## 2013-12-11 ENCOUNTER — Other Ambulatory Visit: Payer: Self-pay | Admitting: Oncology

## 2013-12-11 ENCOUNTER — Encounter: Payer: Self-pay | Admitting: Medical Oncology

## 2013-12-11 ENCOUNTER — Ambulatory Visit: Payer: Medicare Other

## 2013-12-11 VITALS — BP 127/66 | HR 61 | Temp 98.2°F | Resp 18

## 2013-12-11 DIAGNOSIS — D649 Anemia, unspecified: Secondary | ICD-10-CM

## 2013-12-11 DIAGNOSIS — D469 Myelodysplastic syndrome, unspecified: Secondary | ICD-10-CM

## 2013-12-11 LAB — CBC WITH DIFFERENTIAL/PLATELET
BASO%: 2 % (ref 0.0–2.0)
Basophils Absolute: 0.1 10*3/uL (ref 0.0–0.1)
EOS%: 1 % (ref 0.0–7.0)
Eosinophils Absolute: 0 10*3/uL (ref 0.0–0.5)
HEMATOCRIT: 23 % — AB (ref 38.4–49.9)
HGB: 7.6 g/dL — ABNORMAL LOW (ref 13.0–17.1)
LYMPH%: 21.6 % (ref 14.0–49.0)
MCH: 30.5 pg (ref 27.2–33.4)
MCHC: 33.1 g/dL (ref 32.0–36.0)
MCV: 92.3 fL (ref 79.3–98.0)
MONO#: 0 10*3/uL — AB (ref 0.1–0.9)
MONO%: 0.7 % (ref 0.0–14.0)
NEUT#: 2 10*3/uL (ref 1.5–6.5)
NEUT%: 74.7 % (ref 39.0–75.0)
Platelets: 120 10*3/uL — ABNORMAL LOW (ref 140–400)
RBC: 2.49 10*6/uL — AB (ref 4.20–5.82)
RDW: 22.6 % — ABNORMAL HIGH (ref 11.0–14.6)
WBC: 2.7 10*3/uL — ABNORMAL LOW (ref 4.0–10.3)
lymph#: 0.6 10*3/uL — ABNORMAL LOW (ref 0.9–3.3)

## 2013-12-11 LAB — PREPARE RBC (CROSSMATCH)

## 2013-12-11 LAB — TECHNOLOGIST REVIEW

## 2013-12-11 LAB — HOLD TUBE, BLOOD BANK

## 2013-12-11 MED ORDER — SODIUM CHLORIDE 0.9 % IV SOLN
250.0000 mL | Freq: Once | INTRAVENOUS | Status: AC
  Administered 2013-12-11: 250 mL via INTRAVENOUS

## 2013-12-11 MED ORDER — DARBEPOETIN ALFA-POLYSORBATE 300 MCG/0.6ML IJ SOLN
300.0000 ug | Freq: Once | INTRAMUSCULAR | Status: AC
  Administered 2013-12-11: 300 ug via SUBCUTANEOUS
  Filled 2013-12-11: qty 0.6

## 2013-12-11 MED ORDER — ACETAMINOPHEN 325 MG PO TABS
ORAL_TABLET | ORAL | Status: AC
  Filled 2013-12-11: qty 2

## 2013-12-11 MED ORDER — ACETAMINOPHEN 325 MG PO TABS
650.0000 mg | ORAL_TABLET | Freq: Once | ORAL | Status: AC
  Administered 2013-12-11: 650 mg via ORAL

## 2013-12-11 MED ORDER — DIPHENHYDRAMINE HCL 25 MG PO CAPS
25.0000 mg | ORAL_CAPSULE | Freq: Once | ORAL | Status: AC
  Administered 2013-12-11: 25 mg via ORAL

## 2013-12-11 MED ORDER — DIPHENHYDRAMINE HCL 25 MG PO CAPS
ORAL_CAPSULE | ORAL | Status: AC
  Filled 2013-12-11: qty 1

## 2013-12-11 NOTE — Patient Instructions (Signed)
Blood Transfusion Information WHAT IS A BLOOD TRANSFUSION? A transfusion is the replacement of blood or some of its parts. Blood is made up of multiple cells which provide different functions.  Red blood cells carry oxygen and are used for blood loss replacement.  White blood cells fight against infection.  Platelets control bleeding.  Plasma helps clot blood.  Other blood products are available for specialized needs, such as hemophilia or other clotting disorders. BEFORE THE TRANSFUSION  Who gives blood for transfusions?   You may be able to donate blood to be used at a later date on yourself (autologous donation).  Relatives can be asked to donate blood. This is generally not any safer than if you have received blood from a stranger. The same precautions are taken to ensure safety when a relative's blood is donated.  Healthy volunteers who are fully evaluated to make sure their blood is safe. This is blood bank blood. Transfusion therapy is the safest it has ever been in the practice of medicine. Before blood is taken from a donor, a complete history is taken to make sure that person has no history of diseases nor engages in risky social behavior (examples are intravenous drug use or sexual activity with multiple partners). The donor's travel history is screened to minimize risk of transmitting infections, such as malaria. The donated blood is tested for signs of infectious diseases, such as HIV and hepatitis. The blood is then tested to be sure it is compatible with you in order to minimize the chance of a transfusion reaction. If you or a relative donates blood, this is often done in anticipation of surgery and is not appropriate for emergency situations. It takes many days to process the donated blood. RISKS AND COMPLICATIONS Although transfusion therapy is very safe and saves many lives, the main dangers of transfusion include:   Getting an infectious disease.  Developing a  transfusion reaction. This is an allergic reaction to something in the blood you were given. Every precaution is taken to prevent this. The decision to have a blood transfusion has been considered carefully by your caregiver before blood is given. Blood is not given unless the benefits outweigh the risks. AFTER THE TRANSFUSION  Right after receiving a blood transfusion, you will usually feel much better and more energetic. This is especially true if your red blood cells have gotten low (anemic). The transfusion raises the level of the red blood cells which carry oxygen, and this usually causes an energy increase.  The nurse administering the transfusion will monitor you carefully for complications. HOME CARE INSTRUCTIONS  No special instructions are needed after a transfusion. You may find your energy is better. Speak with your caregiver about any limitations on activity for underlying diseases you may have. SEEK MEDICAL CARE IF:   Your condition is not improving after your transfusion.  You develop redness or irritation at the intravenous (IV) site. SEEK IMMEDIATE MEDICAL CARE IF:  Any of the following symptoms occur over the next 12 hours:  Shaking chills.  You have a temperature by mouth above 102 F (38.9 C), not controlled by medicine.  Chest, back, or muscle pain.  People around you feel you are not acting correctly or are confused.  Shortness of breath or difficulty breathing.  Dizziness and fainting.  You get a rash or develop hives.  You have a decrease in urine output.  Your urine turns a dark color or changes to pink, red, or brown. Any of the following   symptoms occur over the next 10 days:  You have a temperature by mouth above 102 F (38.9 C), not controlled by medicine.  Shortness of breath.  Weakness after normal activity.  The white part of the eye turns yellow (jaundice).  You have a decrease in the amount of urine or are urinating less often.  Your  urine turns a dark color or changes to pink, red, or brown. Document Released: 11/02/2000 Document Revised: 01/28/2012 Document Reviewed: 06/21/2008 [employer] Patient Information 2014 [employer].  

## 2013-12-13 LAB — TYPE AND SCREEN
ABO/RH(D): O POS
ANTIBODY SCREEN: NEGATIVE
UNIT DIVISION: 0
UNIT DIVISION: 0

## 2013-12-15 NOTE — Telephone Encounter (Signed)
o

## 2013-12-21 ENCOUNTER — Ambulatory Visit (HOSPITAL_COMMUNITY)
Admission: RE | Admit: 2013-12-21 | Discharge: 2013-12-21 | Disposition: A | Discharge status: Home / Self Care | Payer: Medicare Other | Source: Ambulatory Visit | Attending: Oncology | Admitting: Oncology

## 2013-12-21 DIAGNOSIS — D649 Anemia, unspecified: Secondary | ICD-10-CM | POA: Insufficient documentation

## 2013-12-25 ENCOUNTER — Ambulatory Visit: Payer: Medicare Other

## 2013-12-25 ENCOUNTER — Ambulatory Visit (HOSPITAL_BASED_OUTPATIENT_CLINIC_OR_DEPARTMENT_OTHER): Payer: Medicare Other

## 2013-12-25 ENCOUNTER — Other Ambulatory Visit (HOSPITAL_BASED_OUTPATIENT_CLINIC_OR_DEPARTMENT_OTHER): Payer: Medicare Other

## 2013-12-25 VITALS — BP 135/44 | HR 70 | Temp 97.0°F

## 2013-12-25 DIAGNOSIS — D469 Myelodysplastic syndrome, unspecified: Secondary | ICD-10-CM

## 2013-12-25 DIAGNOSIS — D649 Anemia, unspecified: Secondary | ICD-10-CM

## 2013-12-25 LAB — HOLD TUBE, BLOOD BANK

## 2013-12-25 LAB — CBC WITH DIFFERENTIAL/PLATELET
BASO%: 1.5 % (ref 0.0–2.0)
Basophils Absolute: 0 10*3/uL (ref 0.0–0.1)
EOS%: 1 % (ref 0.0–7.0)
Eosinophils Absolute: 0 10*3/uL (ref 0.0–0.5)
HCT: 24.9 % — ABNORMAL LOW (ref 38.4–49.9)
HGB: 8.3 g/dL — ABNORMAL LOW (ref 13.0–17.1)
LYMPH%: 25.4 % (ref 14.0–49.0)
MCH: 30 pg (ref 27.2–33.4)
MCHC: 33.3 g/dL (ref 32.0–36.0)
MCV: 90 fL (ref 79.3–98.0)
MONO#: 0.2 10*3/uL (ref 0.1–0.9)
MONO%: 9.8 % (ref 0.0–14.0)
NEUT#: 1.5 10*3/uL (ref 1.5–6.5)
NEUT%: 62.3 % (ref 39.0–75.0)
Platelets: 99 10*3/uL — ABNORMAL LOW (ref 140–400)
RBC: 2.77 10*6/uL — ABNORMAL LOW (ref 4.20–5.82)
RDW: 23.2 % — AB (ref 11.0–14.6)
WBC: 2.4 10*3/uL — ABNORMAL LOW (ref 4.0–10.3)
lymph#: 0.6 10*3/uL — ABNORMAL LOW (ref 0.9–3.3)

## 2013-12-25 MED ORDER — DARBEPOETIN ALFA-POLYSORBATE 300 MCG/0.6ML IJ SOLN
300.0000 ug | Freq: Once | INTRAMUSCULAR | Status: AC
  Administered 2013-12-25: 300 ug via SUBCUTANEOUS
  Filled 2013-12-25: qty 0.6

## 2013-12-25 NOTE — Patient Instructions (Signed)
Darbepoetin Alfa injection What is this medicine? DARBEPOETIN ALFA (dar be POE e tin AL fa) helps your body make more red blood cells. It is used to treat anemia caused by chronic kidney failure and chemotherapy. This medicine may be used for other purposes; ask your health care provider or pharmacist if you have questions. COMMON BRAND NAME(S): Aranesp What should I tell my health care provider before I take this medicine? They need to know if you have any of these conditions: -blood clotting disorders or history of blood clots -cancer patient not on chemotherapy -cystic fibrosis -heart disease, such as angina, heart failure, or a history of a heart attack -hemoglobin level of 12 g/dL or greater -high blood pressure -low levels of folate, iron, or vitamin B12 -seizures -an unusual or allergic reaction to darbepoetin, erythropoietin, albumin, hamster proteins, latex, other medicines, foods, dyes, or preservatives -pregnant or trying to get pregnant -breast-feeding How should I use this medicine? This medicine is for injection into a vein or under the skin. It is usually given by a health care professional in a hospital or clinic setting. If you get this medicine at home, you will be taught how to prepare and give this medicine. Do not shake the solution before you withdraw a dose. Use exactly as directed. Take your medicine at regular intervals. Do not take your medicine more often than directed. It is important that you put your used needles and syringes in a special sharps container. Do not put them in a trash can. If you do not have a sharps container, call your pharmacist or healthcare provider to get one. Talk to your pediatrician regarding the use of this medicine in children. While this medicine may be used in children as young as 1 year for selected conditions, precautions do apply. Overdosage: If you think you have taken too much of this medicine contact a poison control center or  emergency room at once. NOTE: This medicine is only for you. Do not share this medicine with others. What if I miss a dose? If you miss a dose, take it as soon as you can. If it is almost time for your next dose, take only that dose. Do not take double or extra doses. What may interact with this medicine? Do not take this medicine with any of the following medications: -epoetin alfa This list may not describe all possible interactions. Give your health care provider a list of all the medicines, herbs, non-prescription drugs, or dietary supplements you use. Also tell them if you smoke, drink alcohol, or use illegal drugs. Some items may interact with your medicine. What should I watch for while using this medicine? Visit your prescriber or health care professional for regular checks on your progress and for the needed blood tests and blood pressure measurements. It is especially important for the doctor to make sure your hemoglobin level is in the desired range, to limit the risk of potential side effects and to give you the best benefit. Keep all appointments for any recommended tests. Check your blood pressure as directed. Ask your doctor what your blood pressure should be and when you should contact him or her. As your body makes more red blood cells, you may need to take iron, folic acid, or vitamin B supplements. Ask your doctor or health care provider which products are right for you. If you have kidney disease continue dietary restrictions, even though this medication can make you feel better. Talk with your doctor or health   care professional about the foods you eat and the vitamins that you take. What side effects may I notice from receiving this medicine? Side effects that you should report to your doctor or health care professional as soon as possible: -allergic reactions like skin rash, itching or hives, swelling of the face, lips, or tongue -breathing problems -changes in vision -chest  pain -confusion, trouble speaking or understanding -feeling faint or lightheaded, falls -high blood pressure -muscle aches or pains -pain, swelling, warmth in the leg -rapid weight gain -severe headaches -sudden numbness or weakness of the face, arm or leg -trouble walking, dizziness, loss of balance or coordination -seizures (convulsions) -swelling of the ankles, feet, hands -unusually weak or tired Side effects that usually do not require medical attention (report to your doctor or health care professional if they continue or are bothersome): -diarrhea -fever, chills (flu-like symptoms) -headaches -nausea, vomiting -redness, stinging, or swelling at site where injected This list may not describe all possible side effects. Call your doctor for medical advice about side effects. You may report side effects to FDA at 1-800-FDA-1088. Where should I keep my medicine? Keep out of the reach of children. Store in a refrigerator between 2 and 8 degrees C (36 and 46 degrees F). Do not freeze. Do not shake. Throw away any unused portion if using a single-dose vial. Throw away any unused medicine after the expiration date. NOTE: This sheet is a summary. It may not cover all possible information. If you have questions about this medicine, talk to your doctor, pharmacist, or health care provider.  2014, [employer]vier/[employer] Standard. (2008-10-19 10:23:57)  

## 2013-12-25 NOTE — Progress Notes (Signed)
Patient in for possible blood transfusion. Hgb=8.3, denies any unusual fatigue, SOB or chest pain/palpitations. Patient and daughter feel he can wait for the next 2 weeks lab check. They understand to call for any increasing symptoms.

## 2014-01-08 ENCOUNTER — Ambulatory Visit: Payer: Medicare Other

## 2014-01-08 ENCOUNTER — Other Ambulatory Visit: Payer: Self-pay | Admitting: Medical Oncology

## 2014-01-08 ENCOUNTER — Other Ambulatory Visit (HOSPITAL_BASED_OUTPATIENT_CLINIC_OR_DEPARTMENT_OTHER): Payer: Medicare Other

## 2014-01-08 ENCOUNTER — Ambulatory Visit (HOSPITAL_BASED_OUTPATIENT_CLINIC_OR_DEPARTMENT_OTHER): Payer: Medicare Other

## 2014-01-08 VITALS — BP 119/43 | HR 67 | Temp 97.8°F | Resp 16

## 2014-01-08 DIAGNOSIS — D649 Anemia, unspecified: Secondary | ICD-10-CM

## 2014-01-08 LAB — CBC WITH DIFFERENTIAL/PLATELET
BASO%: 1 % (ref 0.0–2.0)
Basophils Absolute: 0 10*3/uL (ref 0.0–0.1)
EOS%: 1 % (ref 0.0–7.0)
Eosinophils Absolute: 0 10*3/uL (ref 0.0–0.5)
HCT: 19.2 % — ABNORMAL LOW (ref 38.4–49.9)
HGB: 6.2 g/dL — CL (ref 13.0–17.1)
LYMPH%: 23.7 % (ref 14.0–49.0)
MCH: 29.4 pg (ref 27.2–33.4)
MCHC: 32.2 g/dL (ref 32.0–36.0)
MCV: 91.2 fL (ref 79.3–98.0)
MONO#: 0.2 10*3/uL (ref 0.1–0.9)
MONO%: 8.5 % (ref 0.0–14.0)
NEUT#: 1.3 10*3/uL — ABNORMAL LOW (ref 1.5–6.5)
NEUT%: 65.8 % (ref 39.0–75.0)
PLATELETS: 115 10*3/uL — AB (ref 140–400)
RBC: 2.11 10*6/uL — AB (ref 4.20–5.82)
RDW: 25.5 % — ABNORMAL HIGH (ref 11.0–14.6)
WBC: 2 10*3/uL — AB (ref 4.0–10.3)
lymph#: 0.5 10*3/uL — ABNORMAL LOW (ref 0.9–3.3)

## 2014-01-08 LAB — TECHNOLOGIST REVIEW

## 2014-01-08 LAB — HOLD TUBE, BLOOD BANK

## 2014-01-08 LAB — PREPARE RBC (CROSSMATCH)

## 2014-01-08 MED ORDER — SODIUM CHLORIDE 0.9 % IV SOLN
250.0000 mL | Freq: Once | INTRAVENOUS | Status: AC
  Administered 2014-01-08: 250 mL via INTRAVENOUS

## 2014-01-08 MED ORDER — ACETAMINOPHEN 325 MG PO TABS
650.0000 mg | ORAL_TABLET | Freq: Once | ORAL | Status: AC
  Administered 2014-01-08: 650 mg via ORAL

## 2014-01-08 MED ORDER — DIPHENHYDRAMINE HCL 25 MG PO CAPS
ORAL_CAPSULE | ORAL | Status: AC
  Filled 2014-01-08: qty 1

## 2014-01-08 MED ORDER — DIPHENHYDRAMINE HCL 25 MG PO CAPS
25.0000 mg | ORAL_CAPSULE | Freq: Once | ORAL | Status: AC
  Administered 2014-01-08: 25 mg via ORAL

## 2014-01-08 MED ORDER — ACETAMINOPHEN 325 MG PO TABS
ORAL_TABLET | ORAL | Status: AC
  Filled 2014-01-08: qty 2

## 2014-01-08 NOTE — Patient Instructions (Signed)
Blood Transfusion  A blood transfusion replaces your blood or some of its parts. Blood is replaced when you have lost blood because of surgery, an accident, or for severe blood conditions like anemia. You can donate blood to be used on yourself if you have a planned surgery. If you lose blood during that surgery, your own blood can be given back to you. Any blood given to you is checked to make sure it matches your blood type. Your temperature, blood pressure, and heart rate (vital signs) will be checked often.  GET HELP RIGHT AWAY IF:   You feel sick to your stomach (nauseous) or throw up (vomit).  You have watery poop (diarrhea).  You have shortness of breath or trouble breathing.  You have blood in your pee (urine) or have dark colored pee.  You have chest pain or tightness.  Your eyes or skin turn yellow (jaundice).  You have a temperature by mouth above 102 F (38.9 C), not controlled by medicine.  You start to shake and have chills.  You develop a a red rash (hives) or feel itchy.  You develop lightheadedness or feel confused.  You develop back, joint, or muscle pain.  You do not feel hungry (lost appetite).  You feel tired, restless, or nervous.  You develop belly (abdominal) cramps. Document Released: 02/01/2009 Document Revised: 01/28/2012 Document Reviewed: 02/01/2009 [employer] Patient Information 2014 [employer].  

## 2014-01-09 LAB — TYPE AND SCREEN
ABO/RH(D): O POS
Antibody Screen: NEGATIVE
UNIT DIVISION: 0
Unit division: 0

## 2014-01-18 ENCOUNTER — Ambulatory Visit (HOSPITAL_COMMUNITY)
Admission: RE | Admit: 2014-01-18 | Discharge: 2014-01-18 | Disposition: A | Discharge status: Home / Self Care | Payer: Medicare Other | Source: Ambulatory Visit | Attending: Oncology | Admitting: Oncology

## 2014-01-18 DIAGNOSIS — D471 Chronic myeloproliferative disease: Secondary | ICD-10-CM

## 2014-01-18 DIAGNOSIS — D649 Anemia, unspecified: Secondary | ICD-10-CM

## 2014-01-18 DIAGNOSIS — D47Z9 Other specified neoplasms of uncertain behavior of lymphoid, hematopoietic and related tissue: Secondary | ICD-10-CM | POA: Insufficient documentation

## 2014-01-21 ENCOUNTER — Ambulatory Visit (HOSPITAL_BASED_OUTPATIENT_CLINIC_OR_DEPARTMENT_OTHER): Payer: Medicare Other | Admitting: Oncology

## 2014-01-21 ENCOUNTER — Other Ambulatory Visit (HOSPITAL_BASED_OUTPATIENT_CLINIC_OR_DEPARTMENT_OTHER): Payer: Medicare Other

## 2014-01-21 ENCOUNTER — Encounter: Payer: Self-pay | Admitting: Oncology

## 2014-01-21 ENCOUNTER — Ambulatory Visit (HOSPITAL_BASED_OUTPATIENT_CLINIC_OR_DEPARTMENT_OTHER): Payer: Medicare Other

## 2014-01-21 ENCOUNTER — Telehealth: Payer: Self-pay | Admitting: Oncology

## 2014-01-21 ENCOUNTER — Ambulatory Visit: Payer: Medicare Other

## 2014-01-21 VITALS — BP 128/42 | HR 62 | Temp 98.3°F | Resp 18

## 2014-01-21 VITALS — BP 135/47 | HR 81 | Temp 98.2°F | Resp 18 | Ht 69.0 in | Wt 176.9 lb

## 2014-01-21 DIAGNOSIS — D471 Chronic myeloproliferative disease: Secondary | ICD-10-CM

## 2014-01-21 DIAGNOSIS — D696 Thrombocytopenia, unspecified: Secondary | ICD-10-CM

## 2014-01-21 DIAGNOSIS — D649 Anemia, unspecified: Secondary | ICD-10-CM

## 2014-01-21 DIAGNOSIS — D47Z9 Other specified neoplasms of uncertain behavior of lymphoid, hematopoietic and related tissue: Secondary | ICD-10-CM

## 2014-01-21 DIAGNOSIS — I10 Essential (primary) hypertension: Secondary | ICD-10-CM

## 2014-01-21 DIAGNOSIS — D709 Neutropenia, unspecified: Secondary | ICD-10-CM

## 2014-01-21 LAB — CBC WITH DIFFERENTIAL/PLATELET
BASO%: 1.1 % (ref 0.0–2.0)
Basophils Absolute: 0 10*3/uL (ref 0.0–0.1)
EOS ABS: 0 10*3/uL (ref 0.0–0.5)
EOS%: 0.9 % (ref 0.0–7.0)
HCT: 23.5 % — ABNORMAL LOW (ref 38.4–49.9)
HEMOGLOBIN: 7.7 g/dL — AB (ref 13.0–17.1)
LYMPH#: 0.6 10*3/uL — AB (ref 0.9–3.3)
LYMPH%: 24.8 % (ref 14.0–49.0)
MCH: 28.9 pg (ref 27.2–33.4)
MCHC: 32.6 g/dL (ref 32.0–36.0)
MCV: 88.7 fL (ref 79.3–98.0)
MONO#: 0.2 10*3/uL (ref 0.1–0.9)
MONO%: 8.1 % (ref 0.0–14.0)
NEUT#: 1.5 10*3/uL (ref 1.5–6.5)
NEUT%: 65.1 % (ref 39.0–75.0)
Platelets: 103 10*3/uL — ABNORMAL LOW (ref 140–400)
RBC: 2.65 10*6/uL — ABNORMAL LOW (ref 4.20–5.82)
RDW: 21.9 % — AB (ref 11.0–14.6)
WBC: 2.4 10*3/uL — ABNORMAL LOW (ref 4.0–10.3)

## 2014-01-21 LAB — PREPARE RBC (CROSSMATCH)

## 2014-01-21 LAB — TECHNOLOGIST REVIEW

## 2014-01-21 LAB — HOLD TUBE, BLOOD BANK

## 2014-01-21 MED ORDER — ACETAMINOPHEN 325 MG PO TABS
650.0000 mg | ORAL_TABLET | Freq: Once | ORAL | Status: AC
  Administered 2014-01-21: 650 mg via ORAL

## 2014-01-21 MED ORDER — DIPHENHYDRAMINE HCL 25 MG PO CAPS
25.0000 mg | ORAL_CAPSULE | Freq: Once | ORAL | Status: AC
  Administered 2014-01-21: 25 mg via ORAL

## 2014-01-21 MED ORDER — DIPHENHYDRAMINE HCL 25 MG PO CAPS
ORAL_CAPSULE | ORAL | Status: AC
  Filled 2014-01-21: qty 1

## 2014-01-21 MED ORDER — ACETAMINOPHEN 325 MG PO TABS
ORAL_TABLET | ORAL | Status: AC
  Filled 2014-01-21: qty 2

## 2014-01-21 MED ORDER — HEPARIN SOD (PORK) LOCK FLUSH 100 UNIT/ML IV SOLN
500.0000 [IU] | Freq: Every day | INTRAVENOUS | Status: DC | PRN
  Filled 2014-01-21: qty 5

## 2014-01-21 MED ORDER — SODIUM CHLORIDE 0.9 % IV SOLN
250.0000 mL | Freq: Once | INTRAVENOUS | Status: AC
  Administered 2014-01-21: 250 mL via INTRAVENOUS

## 2014-01-21 MED ORDER — DARBEPOETIN ALFA-POLYSORBATE 300 MCG/0.6ML IJ SOLN
300.0000 ug | Freq: Once | INTRAMUSCULAR | Status: AC
  Administered 2014-01-21: 300 ug via SUBCUTANEOUS
  Filled 2014-01-21: qty 0.6

## 2014-01-21 NOTE — Telephone Encounter (Signed)
gv and printed appt sched and avs for pt for March thru May...sed added tx. °

## 2014-01-21 NOTE — Patient Instructions (Signed)
Blood Transfusion Information WHAT IS A BLOOD TRANSFUSION? A transfusion is the replacement of blood or some of its parts. Blood is made up of multiple cells which provide different functions.  Red blood cells carry oxygen and are used for blood loss replacement.  White blood cells fight against infection.  Platelets control bleeding.  Plasma helps clot blood.  Other blood products are available for specialized needs, such as hemophilia or other clotting disorders. BEFORE THE TRANSFUSION  Who gives blood for transfusions?   You may be able to donate blood to be used at a later date on yourself (autologous donation).  Relatives can be asked to donate blood. This is generally not any safer than if you have received blood from a stranger. The same precautions are taken to ensure safety when a relative's blood is donated.  Healthy volunteers who are fully evaluated to make sure their blood is safe. This is blood bank blood. Transfusion therapy is the safest it has ever been in the practice of medicine. Before blood is taken from a donor, a complete history is taken to make sure that person has no history of diseases nor engages in risky social behavior (examples are intravenous drug use or sexual activity with multiple partners). The donor's travel history is screened to minimize risk of transmitting infections, such as malaria. The donated blood is tested for signs of infectious diseases, such as HIV and hepatitis. The blood is then tested to be sure it is compatible with you in order to minimize the chance of a transfusion reaction. If you or a relative donates blood, this is often done in anticipation of surgery and is not appropriate for emergency situations. It takes many days to process the donated blood. RISKS AND COMPLICATIONS Although transfusion therapy is very safe and saves many lives, the main dangers of transfusion include:   Getting an infectious disease.  Developing a  transfusion reaction. This is an allergic reaction to something in the blood you were given. Every precaution is taken to prevent this. The decision to have a blood transfusion has been considered carefully by your caregiver before blood is given. Blood is not given unless the benefits outweigh the risks. AFTER THE TRANSFUSION  Right after receiving a blood transfusion, you will usually feel much better and more energetic. This is especially true if your red blood cells have gotten low (anemic). The transfusion raises the level of the red blood cells which carry oxygen, and this usually causes an energy increase.  The nurse administering the transfusion will monitor you carefully for complications. HOME CARE INSTRUCTIONS  No special instructions are needed after a transfusion. You may find your energy is better. Speak with your caregiver about any limitations on activity for underlying diseases you may have. SEEK MEDICAL CARE IF:   Your condition is not improving after your transfusion.  You develop redness or irritation at the intravenous (IV) site. SEEK IMMEDIATE MEDICAL CARE IF:  Any of the following symptoms occur over the next 12 hours:  Shaking chills.  You have a temperature by mouth above 102 F (38.9 C), not controlled by medicine.  Chest, back, or muscle pain.  People around you feel you are not acting correctly or are confused.  Shortness of breath or difficulty breathing.  Dizziness and fainting.  You get a rash or develop hives.  You have a decrease in urine output.  Your urine turns a dark color or changes to pink, red, or brown. Any of the following   symptoms occur over the next 10 days:  You have a temperature by mouth above 102 F (38.9 C), not controlled by medicine.  Shortness of breath.  Weakness after normal activity.  The white part of the eye turns yellow (jaundice).  You have a decrease in the amount of urine or are urinating less often.  Your  urine turns a dark color or changes to pink, red, or brown. Document Released: 11/02/2000 Document Revised: 01/28/2012 Document Reviewed: 06/21/2008 [employer] Patient Information 2014 [employer].  

## 2014-01-21 NOTE — Progress Notes (Signed)
Hematology and Oncology Follow Up Visit  Kyle Heath [id number] 01-15-1927 80 y.o. 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 He he might have an element of myelodysplastic syndrome as well. Bone marrow biopsy in 01/2012 confirmed the diagnosis.   Current therapy:  Supportive transfusions every 2-4 weeks as needed and Aransep 300 mcg every two weeks.   Interim History:  Kyle Heath presents today for a routine visit with his daughter. He is a pleasant 79 year old gentleman who presented with a severe anemia and the above diagnosis. He continues to be transfusion dependent but remains functional at this time. He continues to ambulate without the aid of a walker or difficulty. He reports fatigue to be stable. Denies chest pain, shortness of breath, and dyspnea.  Appetite and weight are stable. No new complications at this time. He is not reporting any recent hospitalization or emesis. Is not reporting any recent bleeding or infections. He reported no bleeding complications. He still object into a Port-A-Cath insertion at this time but he is recognizing the difficulty of obtaining IV access on him repeatedly.    Medications: I have reviewed the patient's current medications. Current Outpatient Prescriptions  Medication Sig Dispense Refill  . aspirin 81 MG tablet Take 81 mg by mouth daily after breakfast.       . carvedilol (COREG) 25 MG tablet Take 1 tablet (25 mg total) by mouth 2 (two) times daily with a meal.  60 tablet  0  . diltiazem (CARDIZEM CD) 360 MG 24 hr capsule Take 1 capsule (360 mg total) by mouth daily.  30 capsule  2  . febuxostat (ULORIC) 40 MG tablet Take 40 mg by mouth daily.      . furosemide (LASIX) 40 MG tablet Take 1 tablet (40 mg total) by mouth daily.  30 tablet  1  . levothyroxine (SYNTHROID, LEVOTHROID) 88 MCG tablet Take 88 mcg by mouth daily before  breakfast.      . losartan (COZAAR) 100 MG tablet Take 50 mg by mouth daily.       . Multiple Vitamin (MULTIVITAMIN WITH MINERALS) TABS Take 1 tablet by mouth daily.      . potassium chloride SA (K-DUR,KLOR-CON) 20 MEQ tablet Take 1 tablet (20 mEq total) by mouth daily.  30 tablet  1  . TAZTIA XT 360 MG 24 hr capsule Take 360 mg by mouth daily.       No current facility-administered medications for this visit.   Facility-Administered Medications Ordered in Other Visits  Medication Dose Route Frequency Provider Last Rate Last Dose  . furosemide (LASIX) injection 20 mg  20 mg Intravenous Once [name], NP         Allergies: No Known Allergies  Past Medical History, Surgical history, Social history, and Family History were reviewed and updated.  Review of Systems:  Remaining ROS negative.  Physical Exam: Blood pressure 135/47, pulse 81, temperature 98.2 F (36.8 C), temperature source Oral, resp. rate 18, height 5' 9"  (1.753 m), weight 176 lb 14.4 oz (80.241 kg), SpO2 96.00%. ECOG: 2 General appearance: alert. Pleasantly confused. Head: Normocephalic, without obvious abnormality, atraumatic Neck: no adenopathy, no carotid bruit, no JVD, supple, symmetrical, trachea midline and thyroid not enlarged, symmetric, no tenderness/mass/nodules Lymph nodes: Cervical, supraclavicular, and axillary nodes normal. Heart:regular rate and rhythm, S1, S2 normal, no murmur, click, rub or gallop Lung:chest clear, no wheezing, rales, normal symmetric air entry  Abdomen: soft, non-tender, without masses or organomegaly EXT:no erythema, induration, or nodules. Trace edema noted in his lower extremities  Lab Results:  CBC    Component Value Date/Time   WBC 2.4* 01/21/2014 0902   WBC 2.3* 01/21/2013 1431   RBC 2.65* 01/21/2014 0902   RBC 3.12* 01/21/2013 1431   HGB 7.7* 01/21/2014 0902   HGB 9.0* 01/21/2013 1431   HCT 23.5* 01/21/2014 0902   HCT 27.4* 01/21/2013 1431   PLT 103* 01/21/2014 0902   PLT 217  01/21/2013 1431   MCV 88.7 01/21/2014 0902   MCV 87.8 01/21/2013 1431   MCH 28.9 01/21/2014 0902   MCH 28.8 01/21/2013 1431   MCHC 32.6 01/21/2014 0902   MCHC 32.8 01/21/2013 1431   RDW 21.9* 01/21/2014 0902   RDW 20.3* 01/21/2013 1431   LYMPHSABS 0.6* 01/21/2014 0902   MONOABS 0.2 01/21/2014 0902   EOSABS 0.0 01/21/2014 0902   BASOSABS 0.0 01/21/2014 0902      Impression and Plan:  80 year old gentleman with the following issues:  1. 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 He could also have an element of myelodysplastic syndrome as well. He is currently on supportive care only. He does not have abdominal pain or a large spleen. Plan to check CBC every 2 weeks and  transfuse when symptomatic from his anemia. His hemoglobin is low today at 7.1. Will given 2 units PRBCs and Aranesp today. His overall prognosis is very poor. I have explained to the patient and his daughter that his prognosis is poor and he is not a candidate for any aggressive treatment.   2. Neutropenia: Stable he has not had any recurrent infections.  3. Thrombocytopenia : Stable. No active bleeding. No transfusion indicated.   4. HTN: On Cardizem and Cozaar per PCP.  5. IV Access: I discussed with him the role of a Port-A-Cath insertion but he continues to refuse at this time. He might be open to it down the line.  6. Followup: Every 2 weeks for lab, injection, and possible transfusion and he will be seen for a visit in 8 weeks.     Kyle Heath 3/5/20159:45 AM

## 2014-01-22 LAB — TYPE AND SCREEN
ABO/RH(D): O POS
Antibody Screen: NEGATIVE
Unit division: 0
Unit division: 0

## 2014-02-05 ENCOUNTER — Other Ambulatory Visit: Payer: Self-pay | Admitting: Oncology

## 2014-02-05 ENCOUNTER — Other Ambulatory Visit (HOSPITAL_BASED_OUTPATIENT_CLINIC_OR_DEPARTMENT_OTHER): Payer: Medicare Other

## 2014-02-05 ENCOUNTER — Ambulatory Visit (HOSPITAL_BASED_OUTPATIENT_CLINIC_OR_DEPARTMENT_OTHER): Payer: Medicare Other

## 2014-02-05 ENCOUNTER — Ambulatory Visit: Payer: Medicare Other

## 2014-02-05 VITALS — BP 111/49 | HR 57 | Temp 97.8°F | Resp 18

## 2014-02-05 DIAGNOSIS — D469 Myelodysplastic syndrome, unspecified: Secondary | ICD-10-CM

## 2014-02-05 DIAGNOSIS — D649 Anemia, unspecified: Secondary | ICD-10-CM

## 2014-02-05 DIAGNOSIS — D471 Chronic myeloproliferative disease: Secondary | ICD-10-CM

## 2014-02-05 LAB — CBC WITH DIFFERENTIAL/PLATELET
BASO%: 1.8 % (ref 0.0–2.0)
Basophils Absolute: 0 10*3/uL (ref 0.0–0.1)
EOS%: 2.3 % (ref 0.0–7.0)
Eosinophils Absolute: 0.1 10*3/uL (ref 0.0–0.5)
HCT: 24.5 % — ABNORMAL LOW (ref 38.4–49.9)
HGB: 7.8 g/dL — ABNORMAL LOW (ref 13.0–17.1)
LYMPH%: 28.9 % (ref 14.0–49.0)
MCH: 28.4 pg (ref 27.2–33.4)
MCHC: 31.8 g/dL — AB (ref 32.0–36.0)
MCV: 89.1 fL (ref 79.3–98.0)
MONO#: 0.2 10*3/uL (ref 0.1–0.9)
MONO%: 6.9 % (ref 0.0–14.0)
NEUT#: 1.3 10*3/uL — ABNORMAL LOW (ref 1.5–6.5)
NEUT%: 60.1 % (ref 39.0–75.0)
NRBC: 1 % — AB (ref 0–0)
Platelets: 96 10*3/uL — ABNORMAL LOW (ref 140–400)
RBC: 2.75 10*6/uL — AB (ref 4.20–5.82)
RDW: 21 % — AB (ref 11.0–14.6)
WBC: 2.2 10*3/uL — ABNORMAL LOW (ref 4.0–10.3)
lymph#: 0.6 10*3/uL — ABNORMAL LOW (ref 0.9–3.3)

## 2014-02-05 LAB — HOLD TUBE, BLOOD BANK

## 2014-02-05 LAB — PREPARE RBC (CROSSMATCH)

## 2014-02-05 LAB — TECHNOLOGIST REVIEW: Technologist Review: 3

## 2014-02-05 MED ORDER — ACETAMINOPHEN 325 MG PO TABS
650.0000 mg | ORAL_TABLET | Freq: Once | ORAL | Status: AC
  Administered 2014-02-05: 650 mg via ORAL

## 2014-02-05 MED ORDER — DIPHENHYDRAMINE HCL 25 MG PO CAPS
ORAL_CAPSULE | ORAL | Status: AC
  Filled 2014-02-05: qty 1

## 2014-02-05 MED ORDER — DIPHENHYDRAMINE HCL 25 MG PO CAPS
25.0000 mg | ORAL_CAPSULE | Freq: Once | ORAL | Status: AC
  Administered 2014-02-05: 25 mg via ORAL

## 2014-02-05 MED ORDER — SODIUM CHLORIDE 0.9 % IJ SOLN
10.0000 mL | INTRAMUSCULAR | Status: DC | PRN
  Filled 2014-02-05: qty 10

## 2014-02-05 MED ORDER — DARBEPOETIN ALFA-POLYSORBATE 300 MCG/0.6ML IJ SOLN
300.0000 ug | Freq: Once | INTRAMUSCULAR | Status: AC
  Administered 2014-02-05: 300 ug via SUBCUTANEOUS
  Filled 2014-02-05: qty 0.6

## 2014-02-05 MED ORDER — ACETAMINOPHEN 325 MG PO TABS
ORAL_TABLET | ORAL | Status: AC
  Filled 2014-02-05: qty 2

## 2014-02-05 NOTE — Patient Instructions (Signed)
Blood Transfusion  A blood transfusion replaces your blood or some of its parts. Blood is replaced when you have lost blood because of surgery, an accident, or for severe blood conditions like anemia. You can donate blood to be used on yourself if you have a planned surgery. If you lose blood during that surgery, your own blood can be given back to you. Any blood given to you is checked to make sure it matches your blood type. Your temperature, blood pressure, and heart rate (vital signs) will be checked often.  GET HELP RIGHT AWAY IF:   You feel sick to your stomach (nauseous) or throw up (vomit).  You have watery poop (diarrhea).  You have shortness of breath or trouble breathing.  You have blood in your pee (urine) or have dark colored pee.  You have chest pain or tightness.  Your eyes or skin turn yellow (jaundice).  You have a temperature by mouth above 102 F (38.9 C), not controlled by medicine.  You start to shake and have chills.  You develop a a red rash (hives) or feel itchy.  You develop lightheadedness or feel confused.  You develop back, joint, or muscle pain.  You do not feel hungry (lost appetite).  You feel tired, restless, or nervous.  You develop belly (abdominal) cramps. Document Released: 02/01/2009 Document Revised: 01/28/2012 Document Reviewed: 02/01/2009 [employer] Patient Information 2014 [employer].  

## 2014-02-05 NOTE — Progress Notes (Signed)
Patient receiving blood transfusion in infusion room, injection to be given there.

## 2014-02-06 LAB — TYPE AND SCREEN
ABO/RH(D): O POS
ANTIBODY SCREEN: NEGATIVE
UNIT DIVISION: 0
Unit division: 0

## 2014-02-17 ENCOUNTER — Ambulatory Visit (HOSPITAL_COMMUNITY)
Admission: RE | Admit: 2014-02-17 | Discharge: 2014-02-17 | Disposition: A | Discharge status: Home / Self Care | Payer: Medicare Other | Source: Ambulatory Visit | Attending: Oncology | Admitting: Oncology

## 2014-02-17 DIAGNOSIS — D649 Anemia, unspecified: Secondary | ICD-10-CM

## 2014-02-18 ENCOUNTER — Other Ambulatory Visit: Payer: Self-pay

## 2014-02-19 ENCOUNTER — Ambulatory Visit: Payer: Medicare Other

## 2014-02-19 ENCOUNTER — Ambulatory Visit (HOSPITAL_BASED_OUTPATIENT_CLINIC_OR_DEPARTMENT_OTHER): Payer: Medicare Other

## 2014-02-19 ENCOUNTER — Other Ambulatory Visit (HOSPITAL_BASED_OUTPATIENT_CLINIC_OR_DEPARTMENT_OTHER): Payer: Medicare Other

## 2014-02-19 VITALS — BP 135/44 | HR 80 | Temp 97.8°F | Resp 19

## 2014-02-19 DIAGNOSIS — D649 Anemia, unspecified: Secondary | ICD-10-CM

## 2014-02-19 DIAGNOSIS — D469 Myelodysplastic syndrome, unspecified: Secondary | ICD-10-CM

## 2014-02-19 DIAGNOSIS — D471 Chronic myeloproliferative disease: Secondary | ICD-10-CM

## 2014-02-19 LAB — CBC WITH DIFFERENTIAL/PLATELET
BASO%: 1.2 % (ref 0.0–2.0)
Basophils Absolute: 0 10*3/uL (ref 0.0–0.1)
EOS%: 1.6 % (ref 0.0–7.0)
Eosinophils Absolute: 0 10*3/uL (ref 0.0–0.5)
HEMATOCRIT: 26.4 % — AB (ref 38.4–49.9)
HGB: 8.8 g/dL — ABNORMAL LOW (ref 13.0–17.1)
LYMPH#: 0.6 10*3/uL — AB (ref 0.9–3.3)
LYMPH%: 23.3 % (ref 14.0–49.0)
MCH: 29.6 pg (ref 27.2–33.4)
MCHC: 33.4 g/dL (ref 32.0–36.0)
MCV: 88.6 fL (ref 79.3–98.0)
MONO#: 0.2 10*3/uL (ref 0.1–0.9)
MONO%: 8.6 % (ref 0.0–14.0)
NEUT#: 1.6 10*3/uL (ref 1.5–6.5)
NEUT%: 65.3 % (ref 39.0–75.0)
Platelets: 103 10*3/uL — ABNORMAL LOW (ref 140–400)
RBC: 2.98 10*6/uL — ABNORMAL LOW (ref 4.20–5.82)
RDW: 17.8 % — ABNORMAL HIGH (ref 11.0–14.6)
WBC: 2.4 10*3/uL — ABNORMAL LOW (ref 4.0–10.3)

## 2014-02-19 LAB — TECHNOLOGIST REVIEW

## 2014-02-19 LAB — HOLD TUBE, BLOOD BANK

## 2014-02-19 MED ORDER — DARBEPOETIN ALFA-POLYSORBATE 300 MCG/0.6ML IJ SOLN
300.0000 ug | Freq: Once | INTRAMUSCULAR | Status: AC
  Administered 2014-02-19: 300 ug via SUBCUTANEOUS
  Filled 2014-02-19: qty 0.6

## 2014-02-19 NOTE — Progress Notes (Signed)
Patient present with daughter, asymptomatic at this time. States "I feel pretty good". Discussed with Mikey Bussing NP lab values and patient being asymptomatic. Per Erasmo Downer, if patient does not feel he needs blood, okay to administer Aranesp. Patient voices he is fine and does not want blood. Daughter and patient refused AVS, stated they have several already. Made them aware of appt 03/05/14 at 9am in 2 weeks for lab work and transfusion if needed, and to call if patient becomes symptomatic before then. Both voiced understanding.

## 2014-02-19 NOTE — Progress Notes (Signed)
Injection given in chemo room

## 2014-02-19 NOTE — Patient Instructions (Signed)
Darbepoetin Alfa injection What is this medicine? DARBEPOETIN ALFA (dar be POE e tin AL fa) helps your body make more red blood cells. It is used to treat anemia caused by chronic kidney failure and chemotherapy. This medicine may be used for other purposes; ask your health care provider or pharmacist if you have questions. COMMON BRAND NAME(S): Aranesp What should I tell my health care provider before I take this medicine? They need to know if you have any of these conditions: -blood clotting disorders or history of blood clots -cancer patient not on chemotherapy -cystic fibrosis -heart disease, such as angina, heart failure, or a history of a heart attack -hemoglobin level of 12 g/dL or greater -high blood pressure -low levels of folate, iron, or vitamin B12 -seizures -an unusual or allergic reaction to darbepoetin, erythropoietin, albumin, hamster proteins, latex, other medicines, foods, dyes, or preservatives -pregnant or trying to get pregnant -breast-feeding How should I use this medicine? This medicine is for injection into a vein or under the skin. It is usually given by a health care professional in a hospital or clinic setting. If you get this medicine at home, you will be taught how to prepare and give this medicine. Do not shake the solution before you withdraw a dose. Use exactly as directed. Take your medicine at regular intervals. Do not take your medicine more often than directed. It is important that you put your used needles and syringes in a special sharps container. Do not put them in a trash can. If you do not have a sharps container, call your pharmacist or healthcare provider to get one. Talk to your pediatrician regarding the use of this medicine in children. While this medicine may be used in children as young as 1 year for selected conditions, precautions do apply. Overdosage: If you think you have taken too much of this medicine contact a poison control center or  emergency room at once. NOTE: This medicine is only for you. Do not share this medicine with others. What if I miss a dose? If you miss a dose, take it as soon as you can. If it is almost time for your next dose, take only that dose. Do not take double or extra doses. What may interact with this medicine? Do not take this medicine with any of the following medications: -epoetin alfa This list may not describe all possible interactions. Give your health care provider a list of all the medicines, herbs, non-prescription drugs, or dietary supplements you use. Also tell them if you smoke, drink alcohol, or use illegal drugs. Some items may interact with your medicine. What should I watch for while using this medicine? Visit your prescriber or health care professional for regular checks on your progress and for the needed blood tests and blood pressure measurements. It is especially important for the doctor to make sure your hemoglobin level is in the desired range, to limit the risk of potential side effects and to give you the best benefit. Keep all appointments for any recommended tests. Check your blood pressure as directed. Ask your doctor what your blood pressure should be and when you should contact him or her. As your body makes more red blood cells, you may need to take iron, folic acid, or vitamin B supplements. Ask your doctor or health care provider which products are right for you. If you have kidney disease continue dietary restrictions, even though this medication can make you feel better. Talk with your doctor or health   care professional about the foods you eat and the vitamins that you take. What side effects may I notice from receiving this medicine? Side effects that you should report to your doctor or health care professional as soon as possible: -allergic reactions like skin rash, itching or hives, swelling of the face, lips, or tongue -breathing problems -changes in vision -chest  pain -confusion, trouble speaking or understanding -feeling faint or lightheaded, falls -high blood pressure -muscle aches or pains -pain, swelling, warmth in the leg -rapid weight gain -severe headaches -sudden numbness or weakness of the face, arm or leg -trouble walking, dizziness, loss of balance or coordination -seizures (convulsions) -swelling of the ankles, feet, hands -unusually weak or tired Side effects that usually do not require medical attention (report to your doctor or health care professional if they continue or are bothersome): -diarrhea -fever, chills (flu-like symptoms) -headaches -nausea, vomiting -redness, stinging, or swelling at site where injected This list may not describe all possible side effects. Call your doctor for medical advice about side effects. You may report side effects to FDA at 1-800-FDA-1088. Where should I keep my medicine? Keep out of the reach of children. Store in a refrigerator between 2 and 8 degrees C (36 and 46 degrees F). Do not freeze. Do not shake. Throw away any unused portion if using a single-dose vial. Throw away any unused medicine after the expiration date. NOTE: This sheet is a summary. It may not cover all possible information. If you have questions about this medicine, talk to your doctor, pharmacist, or health care provider.  2014, [employer]vier/[employer] Standard. (2008-10-19 10:23:57)  

## 2014-02-22 ENCOUNTER — Other Ambulatory Visit: Payer: Self-pay

## 2014-03-05 ENCOUNTER — Other Ambulatory Visit: Payer: Self-pay | Admitting: Medical Oncology

## 2014-03-05 ENCOUNTER — Ambulatory Visit: Payer: Medicare Other

## 2014-03-05 ENCOUNTER — Encounter: Payer: Self-pay | Admitting: Medical Oncology

## 2014-03-05 ENCOUNTER — Other Ambulatory Visit (HOSPITAL_BASED_OUTPATIENT_CLINIC_OR_DEPARTMENT_OTHER): Payer: Medicare Other

## 2014-03-05 ENCOUNTER — Ambulatory Visit (HOSPITAL_BASED_OUTPATIENT_CLINIC_OR_DEPARTMENT_OTHER): Payer: Medicare Other

## 2014-03-05 VITALS — BP 122/39 | HR 66 | Temp 98.2°F | Resp 18

## 2014-03-05 DIAGNOSIS — D649 Anemia, unspecified: Secondary | ICD-10-CM

## 2014-03-05 DIAGNOSIS — D47Z9 Other specified neoplasms of uncertain behavior of lymphoid, hematopoietic and related tissue: Secondary | ICD-10-CM

## 2014-03-05 DIAGNOSIS — D471 Chronic myeloproliferative disease: Secondary | ICD-10-CM

## 2014-03-05 LAB — CBC WITH DIFFERENTIAL/PLATELET
BASO%: 1 % (ref 0.0–2.0)
Basophils Absolute: 0 10*3/uL (ref 0.0–0.1)
EOS%: 1.9 % (ref 0.0–7.0)
Eosinophils Absolute: 0 10*3/uL (ref 0.0–0.5)
HCT: 21.1 % — ABNORMAL LOW (ref 38.4–49.9)
HGB: 6.7 g/dL — CL (ref 13.0–17.1)
LYMPH%: 30.8 % (ref 14.0–49.0)
MCH: 28.3 pg (ref 27.2–33.4)
MCHC: 31.8 g/dL — AB (ref 32.0–36.0)
MCV: 89 fL (ref 79.3–98.0)
MONO#: 0.2 10*3/uL (ref 0.1–0.9)
MONO%: 8.7 % (ref 0.0–14.0)
NEUT#: 1.2 10*3/uL — ABNORMAL LOW (ref 1.5–6.5)
NEUT%: 57.6 % (ref 39.0–75.0)
NRBC: 2 % — AB (ref 0–0)
Platelets: 94 10*3/uL — ABNORMAL LOW (ref 140–400)
RBC: 2.37 10*6/uL — AB (ref 4.20–5.82)
RDW: 21.1 % — ABNORMAL HIGH (ref 11.0–14.6)
WBC: 2.1 10*3/uL — AB (ref 4.0–10.3)
lymph#: 0.6 10*3/uL — ABNORMAL LOW (ref 0.9–3.3)

## 2014-03-05 LAB — TECHNOLOGIST REVIEW

## 2014-03-05 LAB — HOLD TUBE, BLOOD BANK

## 2014-03-05 LAB — PREPARE RBC (CROSSMATCH)

## 2014-03-05 MED ORDER — DIPHENHYDRAMINE HCL 25 MG PO CAPS
25.0000 mg | ORAL_CAPSULE | Freq: Once | ORAL | Status: AC
  Administered 2014-03-05: 25 mg via ORAL

## 2014-03-05 MED ORDER — DARBEPOETIN ALFA-POLYSORBATE 300 MCG/0.6ML IJ SOLN
300.0000 ug | Freq: Once | INTRAMUSCULAR | Status: AC
Start: 2014-03-05 — End: 2014-03-05
  Administered 2014-03-05: 300 ug via SUBCUTANEOUS
  Filled 2014-03-05: qty 0.6

## 2014-03-05 MED ORDER — ACETAMINOPHEN 325 MG PO TABS
650.0000 mg | ORAL_TABLET | Freq: Once | ORAL | Status: AC
  Administered 2014-03-05: 650 mg via ORAL

## 2014-03-05 MED ORDER — DIPHENHYDRAMINE HCL 25 MG PO CAPS
ORAL_CAPSULE | ORAL | Status: AC
  Filled 2014-03-05: qty 1

## 2014-03-05 MED ORDER — SODIUM CHLORIDE 0.9 % IV SOLN
250.0000 mL | Freq: Once | INTRAVENOUS | Status: AC
  Administered 2014-03-05: 250 mL via INTRAVENOUS

## 2014-03-05 MED ORDER — ACETAMINOPHEN 325 MG PO TABS
ORAL_TABLET | ORAL | Status: AC
  Filled 2014-03-05: qty 2

## 2014-03-05 MED ORDER — SODIUM CHLORIDE 0.9 % IJ SOLN
3.0000 mL | INTRAMUSCULAR | Status: DC | PRN
  Filled 2014-03-05: qty 10

## 2014-03-05 NOTE — Progress Notes (Signed)
Second unit blood started at 1425 at 50 ml/hr..  Vitals wnl at 1440 and rate increased to 185 ml/hr

## 2014-03-05 NOTE — Progress Notes (Signed)
Called daughter, Olevia Bowens to pick patient up.  Escorted to lobby ay [phone], [name] in lobby waiting.  Ambulatory in no distress.

## 2014-03-05 NOTE — Progress Notes (Signed)
Tolerating second unit blood with no reaction or distress

## 2014-03-05 NOTE — Progress Notes (Signed)
Blood ended with no reaction.

## 2014-03-05 NOTE — Progress Notes (Signed)
1st unit blood started at 12:15 pm at 79ml/hr.  12:30 VSS, no distress, blood ratet increased to 185 ml/hr

## 2014-03-05 NOTE — Progress Notes (Signed)
Vitals pre-blood transfusion wnl.

## 2014-03-05 NOTE — Patient Instructions (Signed)
Blood Transfusion Information WHAT IS A BLOOD TRANSFUSION? A transfusion is the replacement of blood or some of its parts. Blood is made up of multiple cells which provide different functions.  Red blood cells carry oxygen and are used for blood loss replacement.  White blood cells fight against infection.  Platelets control bleeding.  Plasma helps clot blood.  Other blood products are available for specialized needs, such as hemophilia or other clotting disorders. BEFORE THE TRANSFUSION  Who gives blood for transfusions?   You may be able to donate blood to be used at a later date on yourself (autologous donation).  Relatives can be asked to donate blood. This is generally not any safer than if you have received blood from a stranger. The same precautions are taken to ensure safety when a relative's blood is donated.  Healthy volunteers who are fully evaluated to make sure their blood is safe. This is blood bank blood. Transfusion therapy is the safest it has ever been in the practice of medicine. Before blood is taken from a donor, a complete history is taken to make sure that person has no history of diseases nor engages in risky social behavior (examples are intravenous drug use or sexual activity with multiple partners). The donor's travel history is screened to minimize risk of transmitting infections, such as malaria. The donated blood is tested for signs of infectious diseases, such as HIV and hepatitis. The blood is then tested to be sure it is compatible with you in order to minimize the chance of a transfusion reaction. If you or a relative donates blood, this is often done in anticipation of surgery and is not appropriate for emergency situations. It takes many days to process the donated blood. RISKS AND COMPLICATIONS Although transfusion therapy is very safe and saves many lives, the main dangers of transfusion include:   Getting an infectious disease.  Developing a  transfusion reaction. This is an allergic reaction to something in the blood you were given. Every precaution is taken to prevent this. The decision to have a blood transfusion has been considered carefully by your caregiver before blood is given. Blood is not given unless the benefits outweigh the risks. AFTER THE TRANSFUSION  Right after receiving a blood transfusion, you will usually feel much better and more energetic. This is especially true if your red blood cells have gotten low (anemic). The transfusion raises the level of the red blood cells which carry oxygen, and this usually causes an energy increase.  The nurse administering the transfusion will monitor you carefully for complications. HOME CARE INSTRUCTIONS  No special instructions are needed after a transfusion. You may find your energy is better. Speak with your caregiver about any limitations on activity for underlying diseases you may have. SEEK MEDICAL CARE IF:   Your condition is not improving after your transfusion.  You develop redness or irritation at the intravenous (IV) site. SEEK IMMEDIATE MEDICAL CARE IF:  Any of the following symptoms occur over the next 12 hours:  Shaking chills.  You have a temperature by mouth above 102 F (38.9 C), not controlled by medicine.  Chest, back, or muscle pain.  People around you feel you are not acting correctly or are confused.  Shortness of breath or difficulty breathing.  Dizziness and fainting.  You get a rash or develop hives.  You have a decrease in urine output.  Your urine turns a dark color or changes to pink, red, or brown. Any of the following   symptoms occur over the next 10 days:  You have a temperature by mouth above 102 F (38.9 C), not controlled by medicine.  Shortness of breath.  Weakness after normal activity.  The white part of the eye turns yellow (jaundice).  You have a decrease in the amount of urine or are urinating less often.  Your  urine turns a dark color or changes to pink, red, or brown. Document Released: 11/02/2000 Document Revised: 01/28/2012 Document Reviewed: 06/21/2008 [employer] Patient Information 2014 [employer].  

## 2014-03-05 NOTE — Progress Notes (Signed)
Tolerating blood well. 

## 2014-03-07 LAB — TYPE AND SCREEN
ABO/RH(D): O POS
Antibody Screen: NEGATIVE
UNIT DIVISION: 0
Unit division: 0

## 2014-03-19 ENCOUNTER — Telehealth: Payer: Self-pay | Admitting: *Deleted

## 2014-03-19 ENCOUNTER — Ambulatory Visit (HOSPITAL_BASED_OUTPATIENT_CLINIC_OR_DEPARTMENT_OTHER): Payer: Medicare Other

## 2014-03-19 ENCOUNTER — Ambulatory Visit: Payer: Medicare Other

## 2014-03-19 ENCOUNTER — Ambulatory Visit (HOSPITAL_COMMUNITY)
Admission: RE | Admit: 2014-03-19 | Discharge: 2014-03-19 | Disposition: A | Discharge status: Home / Self Care | Payer: Medicare Other | Source: Ambulatory Visit | Attending: Oncology | Admitting: Oncology

## 2014-03-19 ENCOUNTER — Other Ambulatory Visit: Payer: Self-pay

## 2014-03-19 ENCOUNTER — Ambulatory Visit (HOSPITAL_BASED_OUTPATIENT_CLINIC_OR_DEPARTMENT_OTHER): Payer: Medicare Other | Admitting: Physician Assistant

## 2014-03-19 ENCOUNTER — Telehealth: Payer: Self-pay | Admitting: Oncology

## 2014-03-19 ENCOUNTER — Other Ambulatory Visit (HOSPITAL_BASED_OUTPATIENT_CLINIC_OR_DEPARTMENT_OTHER): Payer: Medicare Other

## 2014-03-19 ENCOUNTER — Encounter: Payer: Self-pay | Admitting: Physician Assistant

## 2014-03-19 VITALS — BP 121/40 | HR 70 | Temp 98.4°F | Resp 18

## 2014-03-19 VITALS — BP 126/45 | HR 87 | Temp 98.3°F | Resp 20 | Ht 69.0 in | Wt 177.5 lb

## 2014-03-19 DIAGNOSIS — D696 Thrombocytopenia, unspecified: Secondary | ICD-10-CM

## 2014-03-19 DIAGNOSIS — D649 Anemia, unspecified: Secondary | ICD-10-CM | POA: Insufficient documentation

## 2014-03-19 DIAGNOSIS — D471 Chronic myeloproliferative disease: Secondary | ICD-10-CM

## 2014-03-19 DIAGNOSIS — I10 Essential (primary) hypertension: Secondary | ICD-10-CM

## 2014-03-19 DIAGNOSIS — D709 Neutropenia, unspecified: Secondary | ICD-10-CM

## 2014-03-19 LAB — CBC WITH DIFFERENTIAL/PLATELET
BASO%: 3.6 % — ABNORMAL HIGH (ref 0.0–2.0)
BASOS ABS: 0.1 10*3/uL (ref 0.0–0.1)
EOS ABS: 0 10*3/uL (ref 0.0–0.5)
EOS%: 1.8 % (ref 0.0–7.0)
HEMATOCRIT: 23.1 % — AB (ref 38.4–49.9)
HEMOGLOBIN: 7.5 g/dL — AB (ref 13.0–17.1)
LYMPH%: 36.4 % (ref 14.0–49.0)
MCH: 28.8 pg (ref 27.2–33.4)
MCHC: 32.5 g/dL (ref 32.0–36.0)
MCV: 88.8 fL (ref 79.3–98.0)
MONO#: 0.2 10*3/uL (ref 0.1–0.9)
MONO%: 14.5 % — ABNORMAL HIGH (ref 0.0–14.0)
NEUT#: 0.7 10*3/uL — ABNORMAL LOW (ref 1.5–6.5)
NEUT%: 43.7 % (ref 39.0–75.0)
PLATELETS: 95 10*3/uL — AB (ref 140–400)
RBC: 2.6 10*6/uL — ABNORMAL LOW (ref 4.20–5.82)
RDW: 20.7 % — ABNORMAL HIGH (ref 11.0–14.6)
WBC: 1.7 10*3/uL — ABNORMAL LOW (ref 4.0–10.3)
lymph#: 0.6 10*3/uL — ABNORMAL LOW (ref 0.9–3.3)
nRBC: 3 % — ABNORMAL HIGH (ref 0–0)

## 2014-03-19 LAB — PREPARE RBC (CROSSMATCH)

## 2014-03-19 LAB — HOLD TUBE, BLOOD BANK

## 2014-03-19 LAB — TECHNOLOGIST REVIEW: Technologist Review: 5

## 2014-03-19 MED ORDER — ACETAMINOPHEN 325 MG PO TABS
650.0000 mg | ORAL_TABLET | Freq: Once | ORAL | Status: AC
  Administered 2014-03-19: 650 mg via ORAL

## 2014-03-19 MED ORDER — SODIUM CHLORIDE 0.9 % IV SOLN
250.0000 mL | Freq: Once | INTRAVENOUS | Status: AC
  Administered 2014-03-19: 250 mL via INTRAVENOUS

## 2014-03-19 MED ORDER — ACETAMINOPHEN 325 MG PO TABS
ORAL_TABLET | ORAL | Status: AC
  Filled 2014-03-19: qty 2

## 2014-03-19 MED ORDER — DIPHENHYDRAMINE HCL 25 MG PO CAPS
25.0000 mg | ORAL_CAPSULE | Freq: Once | ORAL | Status: AC
  Administered 2014-03-19: 25 mg via ORAL

## 2014-03-19 MED ORDER — DIPHENHYDRAMINE HCL 25 MG PO CAPS
ORAL_CAPSULE | ORAL | Status: AC
  Filled 2014-03-19: qty 1

## 2014-03-19 NOTE — Telephone Encounter (Signed)
gve the pt his may-June 2015 appt schedule. Sent michelle a staff message to add the blood tranfusion appts .

## 2014-03-19 NOTE — Progress Notes (Signed)
Hematology and Oncology Follow Up Visit  Kyle Heath [id number] [birth year] 80 y.o. 03/19/2014 10:54 AM    Principle Diagnosis: 80 year old gentleman with diagnosis of pancytopenia and more specifically severe anemia likely due to myelofibrosis. He he might have an element of myelodysplastic syndrome as well. Bone marrow biopsy in 01/2012 confirmed the diagnosis.   Current therapy:  Supportive transfusions every 2-4 weeks as needed and Aransep 300 mcg every two weeks.   Interim History:  Mr. Begay presents today for a routine visit with his daughter. He is a pleasant 89 year old gentleman who presented with a severe anemia and the above diagnosis. He continues to be transfusion dependent but remains functional at this time. He continues to ambulate without the aid of a walker or difficulty. His level of fatigue remains at about baseline. Denies chest pain, shortness of breath, and dyspnea.  Appetite and weight remain stable. No new complications at this time. He is not reporting any recent hospitalization or emesis. Is not reporting any recent bleeding or infections.    Medications: I have reviewed the patient's current medications. Current Outpatient Prescriptions  Medication Sig Dispense Refill  . aspirin 81 MG tablet Take 81 mg by mouth daily after breakfast.       . levothyroxine (SYNTHROID, LEVOTHROID) 88 MCG tablet Take 88 mcg by mouth daily before breakfast.      . losartan (COZAAR) 100 MG tablet Take 50 mg by mouth daily.       . Multiple Vitamin (MULTIVITAMIN WITH MINERALS) TABS Take 1 tablet by mouth daily.      . potassium chloride SA (K-DUR,KLOR-CON) 20 MEQ tablet Take 1 tablet (20 mEq total) by mouth daily.  30 tablet  1  . TAZTIA XT 360 MG 24 hr capsule Take 360 mg by mouth daily.      . carvedilol (COREG) 25 MG tablet Take 1 tablet (25 mg total) by mouth 2 (two) times daily with a meal.  60 tablet  0  . diltiazem (CARDIZEM CD) 360 MG 24 hr capsule Take 1 capsule (360 mg total) by  mouth daily.  30 capsule  2  . febuxostat (ULORIC) 40 MG tablet Take 40 mg by mouth daily.      . furosemide (LASIX) 40 MG tablet Take 1 tablet (40 mg total) by mouth daily.  30 tablet  1   No current facility-administered medications for this visit.   Facility-Administered Medications Ordered in Other Visits  Medication Dose Route Frequency Provider Last Rate Last Dose  . furosemide (LASIX) injection 20 mg  20 mg Intravenous Once [name], NP         Allergies: No Known Allergies  Past Medical History, Surgical history, Social history, and Family History were reviewed and updated.  Review of Systems:  Remaining ROS negative.  Physical Exam: Blood pressure 126/45, pulse 87, temperature 98.3 F (36.8 C), temperature source Oral, resp. rate 20, height _0  (1.753 m), weight 177 lb 8 oz (80.513 kg), SpO2 97.00%.  ECOG: 2  General appearance: alert. Pleasantly confused. Head: Normocephalic, without obvious abnormality, atraumatic Neck: no adenopathy, no carotid bruit, no JVD, supple, symmetrical, trachea midline and thyroid not enlarged, symmetric, no tenderness/mass/nodules Lymph nodes: Cervical, supraclavicular, and axillary nodes normal. Heart:regular rate and rhythm, S1, S2 normal, no murmur, click, rub or gallop Lung:chest clear, no wheezing, rales, normal symmetric air entry Abdomen: soft, non-tender, without masses or organomegaly EXT:no erythema, induration, or nodules. Trace edema noted in his lower extremities  Lab Results:  CBC  Component Value Date/Time   WBC 1.7* 03/19/2014 0854   WBC 2.3* 01/21/2013 1431   RBC 2.60* 03/19/2014 0854   RBC 3.12* 01/21/2013 1431   HGB 7.5* 03/19/2014 0854   HGB 9.0* 01/21/2013 1431   HCT 23.1* 03/19/2014 0854   HCT 27.4* 01/21/2013 1431   PLT 95* 03/19/2014 0854   PLT 217 01/21/2013 1431   MCV 88.8 03/19/2014 0854   MCV 87.8 01/21/2013 1431   MCH 28.8 03/19/2014 0854   MCH 28.8 01/21/2013 1431   MCHC 32.5 03/19/2014 0854   MCHC 32.8  01/21/2013 1431   RDW 20.7* 03/19/2014 0854   RDW 20.3* 01/21/2013 1431   LYMPHSABS 0.6* 03/19/2014 0854   MONOABS 0.2 03/19/2014 0854   EOSABS 0.0 03/19/2014 0854   BASOSABS 0.1 03/19/2014 0854      Impression and Plan:  80 year old gentleman with the following issues:  1. 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 He could also have an element of myelodysplastic syndrome as well. He is currently on supportive care only. He does not have abdominal pain or a large spleen. Plan to check CBC every 2 weeks and  transfuse when symptomatic from his anemia. His hemoglobin is low today at 7.5. Will given 2 units PRBCs and Aranesp today. His overall prognosis is very poor. The patient and his daughter understand that his prognosis is poor and he is not a candidate for any aggressive treatment.   2. Neutropenia: Stable he has not had any recurrent infections. May need to add GSF if absolute neutrophil count is 0.5 or less  3. Thrombocytopenia : Stable. No active bleeding. No transfusion indicated.   4. HTN: On Cardizem and Cozaar per PCP.  5. IV Access: Poor peripheral IV access over the patient prefers to not pursue a Port-A-Cath at this time. We may need to revisit this in the future if peripheral access becomes more problematic..  6. Followup: Every 2 weeks for lab, injection, and possible transfusion and he will be seen for a visit in 8 weeks.   Patient reviewed with Dr. [name].  Carlton Adam, PA-C 5/1/201510:54 AM

## 2014-03-19 NOTE — Patient Instructions (Signed)
Continue labs and Aranesp every 2 weeks as scheduled, with blood transfusions as needed Followup in approximately 2 months for another symptom management visit

## 2014-03-19 NOTE — Patient Instructions (Signed)
Blood Transfusion  A blood transfusion replaces your blood or some of its parts. Blood is replaced when you have lost blood because of surgery, an accident, or for severe blood conditions like anemia. You can donate blood to be used on yourself if you have a planned surgery. If you lose blood during that surgery, your own blood can be given back to you. Any blood given to you is checked to make sure it matches your blood type. Your temperature, blood pressure, and heart rate (vital signs) will be checked often.  GET HELP RIGHT AWAY IF:   You feel sick to your stomach (nauseous) or throw up (vomit).  You have watery poop (diarrhea).  You have shortness of breath or trouble breathing.  You have blood in your pee (urine) or have dark colored pee.  You have chest pain or tightness.  Your eyes or skin turn yellow (jaundice).  You have a temperature by mouth above 102 F (38.9 C), not controlled by medicine.  You start to shake and have chills.  You develop a a red rash (hives) or feel itchy.  You develop lightheadedness or feel confused.  You develop back, joint, or muscle pain.  You do not feel hungry (lost appetite).  You feel tired, restless, or nervous.  You develop belly (abdominal) cramps. Document Released: 02/01/2009 Document Revised: 01/28/2012 Document Reviewed: 02/01/2009 [facility] Patient Information 2014 [place], [place]. Anemia, Nonspecific Anemia is a condition in which the concentration of red blood cells or hemoglobin in the blood is below normal. Hemoglobin is a substance in red blood cells that carries oxygen to the tissues of the body. Anemia results in not enough oxygen reaching these tissues.  CAUSES  Common causes of anemia include:   Excessive bleeding. Bleeding may be internal or external. This includes excessive bleeding from periods (in women) or from the intestine.   Poor nutrition.   Chronic kidney, thyroid, and liver disease.  Bone marrow  disorders that decrease red blood cell production.  Cancer and treatments for cancer.  HIV, AIDS, and their treatments.  Spleen problems that increase red blood cell destruction.  Blood disorders.  Excess destruction of red blood cells due to infection, medicines, and autoimmune disorders. SIGNS AND SYMPTOMS   Minor weakness.   Dizziness.   Headache.  Palpitations.   Shortness of breath, especially with exercise.   Paleness.  Cold sensitivity.  Indigestion.  Nausea.  Difficulty sleeping.  Difficulty concentrating. Symptoms may occur suddenly or they may develop slowly.  DIAGNOSIS  Additional blood tests are often needed. These help your health care provider determine the best treatment. Your health care provider will check your stool for blood and look for other causes of blood loss.  TREATMENT  Treatment varies depending on the cause of the anemia. Treatment can include:   Supplements of iron, vitamin S34, or folic acid.   Hormone medicines.   A blood transfusion. This may be needed if blood loss is severe.   Hospitalization. This may be needed if there is significant continual blood loss.   Dietary changes.  Spleen removal. HOME CARE INSTRUCTIONS Keep all follow-up appointments. It often takes many weeks to correct anemia, and having your health care provider check on your condition and your response to treatment is very important. SEEK IMMEDIATE MEDICAL CARE IF:   You develop extreme weakness, shortness of breath, or chest pain.   You become dizzy or have trouble concentrating.  You develop heavy vaginal bleeding.   You develop a rash.  You have bloody or black, tarry stools.   You faint.   You vomit up blood.   You vomit repeatedly.   You have abdominal pain.  You have a fever or persistent symptoms for more than 2 3 days.   You have a fever and your symptoms suddenly get worse.   You are dehydrated.  MAKE SURE  YOU:  Understand these instructions.  Will watch your condition.  Will get help right away if you are not doing well or get worse. Document Released: 12/13/2004 Document Revised: 07/08/2013 Document Reviewed: 05/01/2013 [facility] Patient Information 2014 [place].

## 2014-03-19 NOTE — Telephone Encounter (Signed)
Per staff message and POF I have scheduled appts.  [name]  

## 2014-03-20 LAB — TYPE AND SCREEN
ABO/RH(D): O POS
ANTIBODY SCREEN: NEGATIVE
Unit division: 0
Unit division: 0

## 2014-04-02 ENCOUNTER — Ambulatory Visit: Payer: Medicare Other

## 2014-04-02 ENCOUNTER — Other Ambulatory Visit: Payer: Self-pay | Admitting: Oncology

## 2014-04-02 ENCOUNTER — Ambulatory Visit (HOSPITAL_BASED_OUTPATIENT_CLINIC_OR_DEPARTMENT_OTHER): Payer: Medicare Other

## 2014-04-02 ENCOUNTER — Other Ambulatory Visit (HOSPITAL_BASED_OUTPATIENT_CLINIC_OR_DEPARTMENT_OTHER): Payer: Medicare Other

## 2014-04-02 ENCOUNTER — Other Ambulatory Visit: Payer: Self-pay | Admitting: *Deleted

## 2014-04-02 VITALS — BP 147/70 | HR 72 | Temp 98.2°F | Resp 18

## 2014-04-02 DIAGNOSIS — D471 Chronic myeloproliferative disease: Secondary | ICD-10-CM

## 2014-04-02 DIAGNOSIS — D649 Anemia, unspecified: Secondary | ICD-10-CM

## 2014-04-02 DIAGNOSIS — D47Z9 Other specified neoplasms of uncertain behavior of lymphoid, hematopoietic and related tissue: Secondary | ICD-10-CM

## 2014-04-02 LAB — PREPARE RBC (CROSSMATCH)

## 2014-04-02 LAB — CBC WITH DIFFERENTIAL/PLATELET
BASO%: 1.3 % (ref 0.0–2.0)
Basophils Absolute: 0 10*3/uL (ref 0.0–0.1)
EOS%: 0.9 % (ref 0.0–7.0)
Eosinophils Absolute: 0 10*3/uL (ref 0.0–0.5)
HCT: 22.4 % — ABNORMAL LOW (ref 38.4–49.9)
HGB: 7.4 g/dL — ABNORMAL LOW (ref 13.0–17.1)
LYMPH%: 24.3 % (ref 14.0–49.0)
MCH: 29.9 pg (ref 27.2–33.4)
MCHC: 33.1 g/dL (ref 32.0–36.0)
MCV: 90.2 fL (ref 79.3–98.0)
MONO#: 0.2 10*3/uL (ref 0.1–0.9)
MONO%: 10.2 % (ref 0.0–14.0)
NEUT#: 1.5 10*3/uL (ref 1.5–6.5)
NEUT%: 63.3 % (ref 39.0–75.0)
PLATELETS: 105 10*3/uL — AB (ref 140–400)
RBC: 2.48 10*6/uL — ABNORMAL LOW (ref 4.20–5.82)
RDW: 18.9 % — AB (ref 11.0–14.6)
WBC: 2.4 10*3/uL — ABNORMAL LOW (ref 4.0–10.3)
lymph#: 0.6 10*3/uL — ABNORMAL LOW (ref 0.9–3.3)

## 2014-04-02 LAB — HOLD TUBE, BLOOD BANK

## 2014-04-02 LAB — TECHNOLOGIST REVIEW

## 2014-04-02 MED ORDER — ACETAMINOPHEN 325 MG PO TABS
650.0000 mg | ORAL_TABLET | Freq: Once | ORAL | Status: AC
  Administered 2014-04-02: 650 mg via ORAL

## 2014-04-02 MED ORDER — ACETAMINOPHEN 325 MG PO TABS
ORAL_TABLET | ORAL | Status: AC
  Filled 2014-04-02: qty 2

## 2014-04-02 MED ORDER — DIPHENHYDRAMINE HCL 25 MG PO CAPS
25.0000 mg | ORAL_CAPSULE | Freq: Once | ORAL | Status: AC
  Administered 2014-04-02: 25 mg via ORAL

## 2014-04-02 MED ORDER — DARBEPOETIN ALFA-POLYSORBATE 300 MCG/0.6ML IJ SOLN
300.0000 ug | Freq: Once | INTRAMUSCULAR | Status: AC
  Administered 2014-04-02: 300 ug via SUBCUTANEOUS
  Filled 2014-04-02: qty 0.6

## 2014-04-02 MED ORDER — SODIUM CHLORIDE 0.9 % IV SOLN
250.0000 mL | Freq: Once | INTRAVENOUS | Status: AC
  Administered 2014-04-02: 250 mL via INTRAVENOUS

## 2014-04-02 MED ORDER — DIPHENHYDRAMINE HCL 25 MG PO CAPS
ORAL_CAPSULE | ORAL | Status: AC
  Filled 2014-04-02: qty 1

## 2014-04-02 NOTE — Progress Notes (Signed)
1045 1st unit PRBC's started at 50 for 12.5 ml.   1100 Rate increased to 185, patient without any c/o. 1200 Hourly VS taken, infusion continues, patient without c/o.  1240 Transfusion completed, patient without c/o.    1255 2nd unit PRBC's started.  1310 VS taken rate increased to 185, patient without c/o. 1410 Hourly vs taken, patient without c/o.  1430 Transfusion completed, patient without c/o.

## 2014-04-02 NOTE — Progress Notes (Signed)
Patient receiving Blood Transfusion today and will receive Aranesp during this.

## 2014-04-02 NOTE — Patient Instructions (Signed)
Blood Transfusion Information WHAT IS A BLOOD TRANSFUSION? A transfusion is the replacement of blood or some of its parts. Blood is made up of multiple cells which provide different functions.  Red blood cells carry oxygen and are used for blood loss replacement.  White blood cells fight against infection.  Platelets control bleeding.  Plasma helps clot blood.  Other blood products are available for specialized needs, such as hemophilia or other clotting disorders. BEFORE THE TRANSFUSION  Who gives blood for transfusions?   You may be able to donate blood to be used at a later date on yourself (autologous donation).  Relatives can be asked to donate blood. This is generally not any safer than if you have received blood from a stranger. The same precautions are taken to ensure safety when a relative's blood is donated.  Healthy volunteers who are fully evaluated to make sure their blood is safe. This is blood bank blood. Transfusion therapy is the safest it has ever been in the practice of medicine. Before blood is taken from a donor, a complete history is taken to make sure that person has no history of diseases nor engages in risky social behavior (examples are intravenous drug use or sexual activity with multiple partners). The donor's travel history is screened to minimize risk of transmitting infections, such as malaria. The donated blood is tested for signs of infectious diseases, such as HIV and hepatitis. The blood is then tested to be sure it is compatible with you in order to minimize the chance of a transfusion reaction. If you or a relative donates blood, this is often done in anticipation of surgery and is not appropriate for emergency situations. It takes many days to process the donated blood. RISKS AND COMPLICATIONS Although transfusion therapy is very safe and saves many lives, the main dangers of transfusion include:   Getting an infectious disease.  Developing a  transfusion reaction. This is an allergic reaction to something in the blood you were given. Every precaution is taken to prevent this. The decision to have a blood transfusion has been considered carefully by your caregiver before blood is given. Blood is not given unless the benefits outweigh the risks. AFTER THE TRANSFUSION  Right after receiving a blood transfusion, you will usually feel much better and more energetic. This is especially true if your red blood cells have gotten low (anemic). The transfusion raises the level of the red blood cells which carry oxygen, and this usually causes an energy increase.  The nurse administering the transfusion will monitor you carefully for complications. HOME CARE INSTRUCTIONS  No special instructions are needed after a transfusion. You may find your energy is better. Speak with your caregiver about any limitations on activity for underlying diseases you may have. SEEK MEDICAL CARE IF:   Your condition is not improving after your transfusion.  You develop redness or irritation at the intravenous (IV) site. SEEK IMMEDIATE MEDICAL CARE IF:  Any of the following symptoms occur over the next 12 hours:  Shaking chills.  You have a temperature by mouth above 102 F (38.9 C), not controlled by medicine.  Chest, back, or muscle pain.  People around you feel you are not acting correctly or are confused.  Shortness of breath or difficulty breathing.  Dizziness and fainting.  You get a rash or develop hives.  You have a decrease in urine output.  Your urine turns a dark color or changes to pink, red, or brown. Any of the following   symptoms occur over the next 10 days:  You have a temperature by mouth above 102 F (38.9 C), not controlled by medicine.  Shortness of breath.  Weakness after normal activity.  The white part of the eye turns yellow (jaundice).  You have a decrease in the amount of urine or are urinating less often.  Your  urine turns a dark color or changes to pink, red, or brown. Document Released: 11/02/2000 Document Revised: 01/28/2012 Document Reviewed: 06/21/2008 [employer] Patient Information 2014 [employer].  

## 2014-04-05 LAB — TYPE AND SCREEN
ABO/RH(D): O POS
Antibody Screen: NEGATIVE
Unit division: 0
Unit division: 0

## 2014-04-14 ENCOUNTER — Other Ambulatory Visit: Payer: Self-pay

## 2014-04-16 ENCOUNTER — Ambulatory Visit: Payer: Medicare Other

## 2014-04-16 ENCOUNTER — Other Ambulatory Visit: Payer: Self-pay | Admitting: Oncology

## 2014-04-16 ENCOUNTER — Other Ambulatory Visit (HOSPITAL_BASED_OUTPATIENT_CLINIC_OR_DEPARTMENT_OTHER): Payer: Medicare Other

## 2014-04-16 ENCOUNTER — Ambulatory Visit (HOSPITAL_BASED_OUTPATIENT_CLINIC_OR_DEPARTMENT_OTHER): Payer: Medicare Other

## 2014-04-16 VITALS — BP 138/46 | HR 81 | Temp 98.0°F

## 2014-04-16 VITALS — BP 137/55 | HR 69 | Temp 98.5°F | Resp 18

## 2014-04-16 DIAGNOSIS — D649 Anemia, unspecified: Secondary | ICD-10-CM

## 2014-04-16 DIAGNOSIS — D47Z9 Other specified neoplasms of uncertain behavior of lymphoid, hematopoietic and related tissue: Secondary | ICD-10-CM

## 2014-04-16 LAB — CBC WITH DIFFERENTIAL/PLATELET
BASO%: 1.8 % (ref 0.0–2.0)
BASOS ABS: 0 10*3/uL (ref 0.0–0.1)
EOS ABS: 0 10*3/uL (ref 0.0–0.5)
EOS%: 1.3 % (ref 0.0–7.0)
HCT: 23.7 % — ABNORMAL LOW (ref 38.4–49.9)
HEMOGLOBIN: 7.5 g/dL — AB (ref 13.0–17.1)
LYMPH#: 0.7 10*3/uL — AB (ref 0.9–3.3)
LYMPH%: 31.4 % (ref 14.0–49.0)
MCH: 28.4 pg (ref 27.2–33.4)
MCHC: 31.6 g/dL — ABNORMAL LOW (ref 32.0–36.0)
MCV: 89.8 fL (ref 79.3–98.0)
MONO#: 0.1 10*3/uL (ref 0.1–0.9)
MONO%: 6.3 % (ref 0.0–14.0)
NEUT%: 59.2 % (ref 39.0–75.0)
NEUTROS ABS: 1.3 10*3/uL — AB (ref 1.5–6.5)
Platelets: 101 10*3/uL — ABNORMAL LOW (ref 140–400)
RBC: 2.64 10*6/uL — ABNORMAL LOW (ref 4.20–5.82)
RDW: 20.1 % — AB (ref 11.0–14.6)
WBC: 2.2 10*3/uL — ABNORMAL LOW (ref 4.0–10.3)
nRBC: 3 % — ABNORMAL HIGH (ref 0–0)

## 2014-04-16 LAB — TECHNOLOGIST REVIEW: Technologist Review: 5

## 2014-04-16 LAB — PREPARE RBC (CROSSMATCH)

## 2014-04-16 LAB — HOLD TUBE, BLOOD BANK

## 2014-04-16 MED ORDER — ACETAMINOPHEN 325 MG PO TABS
ORAL_TABLET | ORAL | Status: AC
  Filled 2014-04-16: qty 2

## 2014-04-16 MED ORDER — SODIUM CHLORIDE 0.9 % IJ SOLN
10.0000 mL | INTRAMUSCULAR | Status: DC | PRN
  Filled 2014-04-16: qty 10

## 2014-04-16 MED ORDER — HEPARIN SOD (PORK) LOCK FLUSH 100 UNIT/ML IV SOLN
500.0000 [IU] | Freq: Every day | INTRAVENOUS | Status: DC | PRN
  Filled 2014-04-16: qty 5

## 2014-04-16 MED ORDER — DARBEPOETIN ALFA-POLYSORBATE 300 MCG/0.6ML IJ SOLN
300.0000 ug | Freq: Once | INTRAMUSCULAR | Status: AC
  Administered 2014-04-16: 300 ug via SUBCUTANEOUS
  Filled 2014-04-16: qty 0.6

## 2014-04-16 MED ORDER — DIPHENHYDRAMINE HCL 25 MG PO CAPS
ORAL_CAPSULE | ORAL | Status: AC
  Filled 2014-04-16: qty 1

## 2014-04-16 MED ORDER — SODIUM CHLORIDE 0.9 % IV SOLN
250.0000 mL | Freq: Once | INTRAVENOUS | Status: AC
  Administered 2014-04-16: 250 mL via INTRAVENOUS

## 2014-04-16 MED ORDER — DIPHENHYDRAMINE HCL 25 MG PO CAPS
25.0000 mg | ORAL_CAPSULE | Freq: Once | ORAL | Status: AC
  Administered 2014-04-16: 25 mg via ORAL

## 2014-04-16 MED ORDER — ACETAMINOPHEN 325 MG PO TABS
650.0000 mg | ORAL_TABLET | Freq: Once | ORAL | Status: AC
  Administered 2014-04-16: 650 mg via ORAL

## 2014-04-17 LAB — TYPE AND SCREEN
ABO/RH(D): O POS
Antibody Screen: NEGATIVE
Unit division: 0
Unit division: 0

## 2014-04-19 ENCOUNTER — Ambulatory Visit (HOSPITAL_COMMUNITY)
Admission: RE | Admit: 2014-04-19 | Discharge: 2014-04-19 | Disposition: A | Discharge status: Home / Self Care | Payer: Medicare Other | Source: Ambulatory Visit | Attending: Oncology | Admitting: Oncology

## 2014-04-19 DIAGNOSIS — D649 Anemia, unspecified: Secondary | ICD-10-CM | POA: Insufficient documentation

## 2014-04-19 DIAGNOSIS — D471 Chronic myeloproliferative disease: Secondary | ICD-10-CM

## 2014-04-19 DIAGNOSIS — D47Z9 Other specified neoplasms of uncertain behavior of lymphoid, hematopoietic and related tissue: Secondary | ICD-10-CM | POA: Insufficient documentation

## 2014-04-30 ENCOUNTER — Other Ambulatory Visit (HOSPITAL_BASED_OUTPATIENT_CLINIC_OR_DEPARTMENT_OTHER): Payer: Medicare Other

## 2014-04-30 ENCOUNTER — Ambulatory Visit (HOSPITAL_BASED_OUTPATIENT_CLINIC_OR_DEPARTMENT_OTHER): Payer: Medicare Other

## 2014-04-30 VITALS — BP 121/50 | HR 92 | Temp 98.7°F

## 2014-04-30 DIAGNOSIS — D47Z9 Other specified neoplasms of uncertain behavior of lymphoid, hematopoietic and related tissue: Secondary | ICD-10-CM

## 2014-04-30 DIAGNOSIS — D649 Anemia, unspecified: Secondary | ICD-10-CM

## 2014-04-30 LAB — CBC WITH DIFFERENTIAL/PLATELET
BASO%: 2 % (ref 0.0–2.0)
BASOS ABS: 0 10*3/uL (ref 0.0–0.1)
EOS%: 1 % (ref 0.0–7.0)
Eosinophils Absolute: 0 10*3/uL (ref 0.0–0.5)
HCT: 24.3 % — ABNORMAL LOW (ref 38.4–49.9)
HEMOGLOBIN: 7.8 g/dL — AB (ref 13.0–17.1)
LYMPH%: 29.9 % (ref 14.0–49.0)
MCH: 28.8 pg (ref 27.2–33.4)
MCHC: 32.1 g/dL (ref 32.0–36.0)
MCV: 89.7 fL (ref 79.3–98.0)
MONO#: 0.2 10*3/uL (ref 0.1–0.9)
MONO%: 8.1 % (ref 0.0–14.0)
NEUT#: 1.2 10*3/uL — ABNORMAL LOW (ref 1.5–6.5)
NEUT%: 59 % (ref 39.0–75.0)
Platelets: 84 10*3/uL — ABNORMAL LOW (ref 140–400)
RBC: 2.71 10*6/uL — AB (ref 4.20–5.82)
RDW: 19.5 % — AB (ref 11.0–14.6)
WBC: 2 10*3/uL — ABNORMAL LOW (ref 4.0–10.3)
lymph#: 0.6 10*3/uL — ABNORMAL LOW (ref 0.9–3.3)
nRBC: 2 % — ABNORMAL HIGH (ref 0–0)

## 2014-04-30 LAB — TECHNOLOGIST REVIEW

## 2014-04-30 MED ORDER — DARBEPOETIN ALFA-POLYSORBATE 300 MCG/0.6ML IJ SOLN
300.0000 ug | Freq: Once | INTRAMUSCULAR | Status: AC
  Administered 2014-04-30: 300 ug via SUBCUTANEOUS
  Filled 2014-04-30: qty 0.6

## 2014-05-14 ENCOUNTER — Encounter: Payer: Self-pay | Admitting: Oncology

## 2014-05-14 ENCOUNTER — Ambulatory Visit (HOSPITAL_BASED_OUTPATIENT_CLINIC_OR_DEPARTMENT_OTHER): Payer: Medicare Other

## 2014-05-14 ENCOUNTER — Telehealth: Payer: Self-pay | Admitting: Oncology

## 2014-05-14 ENCOUNTER — Ambulatory Visit: Payer: Medicare Other

## 2014-05-14 ENCOUNTER — Other Ambulatory Visit (HOSPITAL_BASED_OUTPATIENT_CLINIC_OR_DEPARTMENT_OTHER): Payer: Medicare Other

## 2014-05-14 ENCOUNTER — Ambulatory Visit (HOSPITAL_BASED_OUTPATIENT_CLINIC_OR_DEPARTMENT_OTHER): Payer: Medicare Other | Admitting: Oncology

## 2014-05-14 VITALS — BP 120/41 | HR 67 | Temp 97.7°F | Resp 17

## 2014-05-14 VITALS — BP 149/42 | HR 88 | Temp 98.0°F | Resp 17 | Ht 69.0 in | Wt 175.5 lb

## 2014-05-14 DIAGNOSIS — D47Z9 Other specified neoplasms of uncertain behavior of lymphoid, hematopoietic and related tissue: Secondary | ICD-10-CM

## 2014-05-14 DIAGNOSIS — D471 Chronic myeloproliferative disease: Secondary | ICD-10-CM

## 2014-05-14 DIAGNOSIS — D649 Anemia, unspecified: Secondary | ICD-10-CM

## 2014-05-14 DIAGNOSIS — D530 Protein deficiency anemia: Secondary | ICD-10-CM

## 2014-05-14 DIAGNOSIS — D696 Thrombocytopenia, unspecified: Secondary | ICD-10-CM

## 2014-05-14 DIAGNOSIS — I10 Essential (primary) hypertension: Secondary | ICD-10-CM

## 2014-05-14 DIAGNOSIS — D709 Neutropenia, unspecified: Secondary | ICD-10-CM

## 2014-05-14 LAB — PREPARE RBC (CROSSMATCH)

## 2014-05-14 LAB — CBC WITH DIFFERENTIAL/PLATELET
BASO%: 1.9 % (ref 0.0–2.0)
BASOS ABS: 0.1 10*3/uL (ref 0.0–0.1)
EOS%: 1.1 % (ref 0.0–7.0)
Eosinophils Absolute: 0 10*3/uL (ref 0.0–0.5)
HEMATOCRIT: 18.5 % — AB (ref 38.4–49.9)
HEMOGLOBIN: 5.8 g/dL — AB (ref 13.0–17.1)
LYMPH%: 33.1 % (ref 14.0–49.0)
MCH: 28.3 pg (ref 27.2–33.4)
MCHC: 31.4 g/dL — ABNORMAL LOW (ref 32.0–36.0)
MCV: 90.2 fL (ref 79.3–98.0)
MONO#: 0.2 10*3/uL (ref 0.1–0.9)
MONO%: 8.3 % (ref 0.0–14.0)
NEUT#: 1.5 10*3/uL (ref 1.5–6.5)
NEUT%: 55.6 % (ref 39.0–75.0)
Platelets: 123 10*3/uL — ABNORMAL LOW (ref 140–400)
RBC: 2.05 10*6/uL — ABNORMAL LOW (ref 4.20–5.82)
RDW: 22.4 % — ABNORMAL HIGH (ref 11.0–14.6)
WBC: 2.7 10*3/uL — ABNORMAL LOW (ref 4.0–10.3)
lymph#: 0.9 10*3/uL (ref 0.9–3.3)
nRBC: 4 % — ABNORMAL HIGH (ref 0–0)

## 2014-05-14 LAB — TECHNOLOGIST REVIEW

## 2014-05-14 MED ORDER — ACETAMINOPHEN 325 MG PO TABS
ORAL_TABLET | ORAL | Status: AC
  Filled 2014-05-14: qty 2

## 2014-05-14 MED ORDER — DARBEPOETIN ALFA-POLYSORBATE 300 MCG/0.6ML IJ SOLN
300.0000 ug | Freq: Once | INTRAMUSCULAR | Status: AC
  Administered 2014-05-14: 300 ug via SUBCUTANEOUS
  Filled 2014-05-14: qty 0.6

## 2014-05-14 MED ORDER — DIPHENHYDRAMINE HCL 25 MG PO CAPS
25.0000 mg | ORAL_CAPSULE | Freq: Once | ORAL | Status: AC
  Administered 2014-05-14: 25 mg via ORAL

## 2014-05-14 MED ORDER — DIPHENHYDRAMINE HCL 25 MG PO CAPS
ORAL_CAPSULE | ORAL | Status: AC
  Filled 2014-05-14: qty 1

## 2014-05-14 MED ORDER — ACETAMINOPHEN 325 MG PO TABS
650.0000 mg | ORAL_TABLET | Freq: Once | ORAL | Status: AC
  Administered 2014-05-14: 650 mg via ORAL

## 2014-05-14 NOTE — Progress Notes (Signed)
Hematology and Oncology Follow Up Visit  Kyle Heath [id number] 13-Jan-1927 80 y.o. 05/14/2014 9:06 AM    Principle Diagnosis: 80 year old gentleman with diagnosis of pancytopenia and more specifically severe anemia likely due to myelofibrosis. He he might have an element of myelodysplastic syndrome as well. Bone marrow biopsy in 01/2012 confirmed the diagnosis.   Current therapy:  Supportive transfusions every 2-4 weeks as needed and Aransep 300 mcg every two weeks.   Interim History:  Kyle Heath presents today for a routine visit with his daughter. He continues to be transfusion dependent but remains functional at this time. He continues to ambulate without the aid of a walker without any difficulty. He has not reported any falls or syncope. He has not reported any chest pain or shortness of breath or dyspnea on exertion His level of fatigue remains at about baseline. Denies chest pain, shortness of breath, and dyspnea.  Appetite and weight remain stable. No new complications at this time. He is not reporting any recent hospitalization or emesis. Is not reporting any recent bleeding or infections. He has not reported any hematochezia or melena. Has not reported any frequency urgency or hesitancy. Has not reported any lymphadenopathy or rash. Has not reported any constitutional symptoms weight loss or appetite changes. His quality of life remains about the same.   Medications: I have reviewed the patient's current medications. Current Outpatient Prescriptions  Medication Sig Dispense Refill  . aspirin 81 MG tablet Take 81 mg by mouth daily after breakfast.       . febuxostat (ULORIC) 40 MG tablet Take 80 mg by mouth daily.       [name] Kitchen levothyroxine (SYNTHROID, LEVOTHROID) 88 MCG tablet Take 88 mcg by mouth daily before breakfast.      . losartan (COZAAR) 100 MG tablet Take 50 mg by mouth daily.       . Multiple Vitamin (MULTIVITAMIN WITH MINERALS) TABS Take 1 tablet by mouth daily.      . potassium  chloride SA (K-DUR,KLOR-CON) 20 MEQ tablet Take 1 tablet (20 mEq total) by mouth daily.  30 tablet  1  . TAZTIA XT 360 MG 24 hr capsule Take 360 mg by mouth daily.      . carvedilol (COREG) 25 MG tablet Take 1 tablet (25 mg total) by mouth 2 (two) times daily with a meal.  60 tablet  0  . diltiazem (CARDIZEM CD) 360 MG 24 hr capsule Take 1 capsule (360 mg total) by mouth daily.  30 capsule  2  . furosemide (LASIX) 40 MG tablet Take 1 tablet (40 mg total) by mouth daily.  30 tablet  1   No current facility-administered medications for this visit.   Facility-Administered Medications Ordered in Other Visits  Medication Dose Route Frequency Provider Last Rate Last Dose  . furosemide (LASIX) injection 20 mg  20 mg Intravenous Once [name], NP         Allergies: No Known Allergies  Past Medical History, Surgical history, Social history, and Family History were reviewed and updated.   Physical Exam: Blood pressure 149/42, pulse 88, temperature 98 F (36.7 C), resp. rate 17, height _0  (1.753 m), weight 175 lb 8 oz (79.606 kg), SpO2 99.00%.  ECOG: 2  General appearance: alert. Not in any distress. Head: Normocephalic, without obvious abnormality, atraumatic Neck: no adenopathy Lymph nodes: Cervical, supraclavicular, and axillary nodes normal. Heart:regular rate and rhythm, S1, S2. Lung:chest clear, no wheezing, rales, normal symmetric air entry Abdomen: soft, non-tender, without masses  or organomegaly EXT:no erythema, induration, or nodules. Trace edema noted in his lower extremities  Lab Results:  CBC    Component Value Date/Time   WBC 2.7* 05/14/2014 0840   WBC 2.3* 01/21/2013 1431   RBC 2.05* 05/14/2014 0840   RBC 3.12* 01/21/2013 1431   HGB 5.8* 05/14/2014 0840   HGB 9.0* 01/21/2013 1431   HCT 18.5* 05/14/2014 0840   HCT 27.4* 01/21/2013 1431   PLT 123* 05/14/2014 0840   PLT 217 01/21/2013 1431   MCV 90.2 05/14/2014 0840   MCV 87.8 01/21/2013 1431   MCH 28.3 05/14/2014 0840    MCH 28.8 01/21/2013 1431   MCHC 31.4* 05/14/2014 0840   MCHC 32.8 01/21/2013 1431   RDW 22.4* 05/14/2014 0840   RDW 20.3* 01/21/2013 1431   LYMPHSABS 0.9 05/14/2014 0840   MONOABS 0.2 05/14/2014 0840   EOSABS 0.0 05/14/2014 0840   BASOSABS 0.1 05/14/2014 0840      Impression and Plan:  80 year old gentleman with the following issues:  1. 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 He could also have an element of myelodysplastic syndrome as well. He is currently on supportive care only. He does not have abdominal pain or a large spleen. His hemoglobin is low today and will require treated to packed red cell transfusion. We will continue with growth factor support every 2 weeks.   2. Neutropenia: Stable he has not had any recurrent infections. May need to add GSF if absolute neutrophil count is 0.5 or less  3. Thrombocytopenia : Stable. No active bleeding. No transfusion indicated.   4. HTN: On Cardizem and Cozaar per PCP.  5. IV Access: Poor peripheral IV access over the patient prefers to not pursue a Port-A-Cath at this time.   6. Followup: Every 2 weeks for lab, injection, and possible transfusion. He'll have a clinical evaluation in 6 weeks.  [facility], MD 6/26/20159:06 AM

## 2014-05-14 NOTE — Patient Instructions (Signed)

## 2014-05-14 NOTE — Telephone Encounter (Signed)
gv adn printed appt sched and avs for pt for July and Aug...sed added tx.[name] KitchenMarland KitchenMarland KitchenPA on pal

## 2014-05-15 LAB — TYPE AND SCREEN
ABO/RH(D): O POS
Antibody Screen: NEGATIVE
UNIT DIVISION: 0
Unit division: 0

## 2014-05-20 ENCOUNTER — Ambulatory Visit (HOSPITAL_COMMUNITY)
Admission: RE | Admit: 2014-05-20 | Discharge: 2014-05-20 | Disposition: A | Discharge status: Home / Self Care | Payer: Medicare Other | Source: Ambulatory Visit | Attending: Oncology | Admitting: Oncology

## 2014-05-20 DIAGNOSIS — D649 Anemia, unspecified: Secondary | ICD-10-CM | POA: Insufficient documentation

## 2014-05-20 DIAGNOSIS — D47Z9 Other specified neoplasms of uncertain behavior of lymphoid, hematopoietic and related tissue: Secondary | ICD-10-CM | POA: Insufficient documentation

## 2014-05-28 ENCOUNTER — Ambulatory Visit (HOSPITAL_BASED_OUTPATIENT_CLINIC_OR_DEPARTMENT_OTHER): Payer: Medicare Other

## 2014-05-28 ENCOUNTER — Other Ambulatory Visit: Payer: Self-pay | Admitting: *Deleted

## 2014-05-28 ENCOUNTER — Other Ambulatory Visit (HOSPITAL_BASED_OUTPATIENT_CLINIC_OR_DEPARTMENT_OTHER): Payer: Medicare Other

## 2014-05-28 ENCOUNTER — Other Ambulatory Visit: Payer: Self-pay | Admitting: Medical Oncology

## 2014-05-28 ENCOUNTER — Ambulatory Visit: Payer: Medicare Other

## 2014-05-28 VITALS — BP 131/46 | HR 69 | Temp 97.6°F | Resp 18

## 2014-05-28 DIAGNOSIS — D471 Chronic myeloproliferative disease: Secondary | ICD-10-CM

## 2014-05-28 DIAGNOSIS — D47Z9 Other specified neoplasms of uncertain behavior of lymphoid, hematopoietic and related tissue: Secondary | ICD-10-CM | POA: Diagnosis not present

## 2014-05-28 DIAGNOSIS — D649 Anemia, unspecified: Secondary | ICD-10-CM | POA: Diagnosis not present

## 2014-05-28 LAB — CBC WITH DIFFERENTIAL/PLATELET
BASO%: 1.4 % (ref 0.0–2.0)
BASOS ABS: 0 10*3/uL (ref 0.0–0.1)
EOS%: 1.2 % (ref 0.0–7.0)
Eosinophils Absolute: 0 10*3/uL (ref 0.0–0.5)
HEMATOCRIT: 21.1 % — AB (ref 38.4–49.9)
HEMOGLOBIN: 6.8 g/dL — AB (ref 13.0–17.1)
LYMPH#: 0.5 10*3/uL — AB (ref 0.9–3.3)
LYMPH%: 24.3 % (ref 14.0–49.0)
MCH: 29.7 pg (ref 27.2–33.4)
MCHC: 32.3 g/dL (ref 32.0–36.0)
MCV: 91.8 fL (ref 79.3–98.0)
MONO#: 0.2 10*3/uL (ref 0.1–0.9)
MONO%: 8.2 % (ref 0.0–14.0)
NEUT#: 1.4 10*3/uL — ABNORMAL LOW (ref 1.5–6.5)
NEUT%: 64.9 % (ref 39.0–75.0)
Platelets: 106 10*3/uL — ABNORMAL LOW (ref 140–400)
RBC: 2.3 10*6/uL — ABNORMAL LOW (ref 4.20–5.82)
RDW: 20.6 % — AB (ref 11.0–14.6)
WBC: 2.1 10*3/uL — AB (ref 4.0–10.3)

## 2014-05-28 LAB — PREPARE RBC (CROSSMATCH)

## 2014-05-28 LAB — TECHNOLOGIST REVIEW: Technologist Review: 7

## 2014-05-28 LAB — HOLD TUBE, BLOOD BANK

## 2014-05-28 MED ORDER — DARBEPOETIN ALFA-POLYSORBATE 300 MCG/0.6ML IJ SOLN
300.0000 ug | Freq: Once | INTRAMUSCULAR | Status: AC
  Administered 2014-05-28: 300 ug via SUBCUTANEOUS
  Filled 2014-05-28: qty 0.6

## 2014-05-28 MED ORDER — ACETAMINOPHEN 325 MG PO TABS
650.0000 mg | ORAL_TABLET | Freq: Once | ORAL | Status: AC
  Administered 2014-05-28: 650 mg via ORAL

## 2014-05-28 MED ORDER — ACETAMINOPHEN 325 MG PO TABS
ORAL_TABLET | ORAL | Status: AC
  Filled 2014-05-28: qty 2

## 2014-05-28 MED ORDER — DIPHENHYDRAMINE HCL 25 MG PO CAPS
ORAL_CAPSULE | ORAL | Status: AC
  Filled 2014-05-28: qty 1

## 2014-05-28 MED ORDER — SODIUM CHLORIDE 0.9 % IV SOLN
250.0000 mL | Freq: Once | INTRAVENOUS | Status: AC
  Administered 2014-05-28: 250 mL via INTRAVENOUS

## 2014-05-28 MED ORDER — DIPHENHYDRAMINE HCL 25 MG PO CAPS
25.0000 mg | ORAL_CAPSULE | Freq: Once | ORAL | Status: AC
  Administered 2014-05-28: 25 mg via ORAL

## 2014-05-28 NOTE — Patient Instructions (Signed)

## 2014-05-30 LAB — TYPE AND SCREEN
ABO/RH(D): O POS
ANTIBODY SCREEN: NEGATIVE
UNIT DIVISION: 0
Unit division: 0

## 2014-06-11 ENCOUNTER — Other Ambulatory Visit (HOSPITAL_BASED_OUTPATIENT_CLINIC_OR_DEPARTMENT_OTHER): Payer: Medicare Other

## 2014-06-11 ENCOUNTER — Ambulatory Visit (HOSPITAL_BASED_OUTPATIENT_CLINIC_OR_DEPARTMENT_OTHER): Payer: Medicare Other

## 2014-06-11 ENCOUNTER — Other Ambulatory Visit: Payer: Self-pay | Admitting: Medical Oncology

## 2014-06-11 ENCOUNTER — Ambulatory Visit: Payer: Medicare Other

## 2014-06-11 ENCOUNTER — Other Ambulatory Visit: Payer: Self-pay | Admitting: *Deleted

## 2014-06-11 VITALS — BP 140/51 | HR 85 | Temp 98.2°F | Resp 18

## 2014-06-11 DIAGNOSIS — D649 Anemia, unspecified: Secondary | ICD-10-CM

## 2014-06-11 DIAGNOSIS — D471 Chronic myeloproliferative disease: Secondary | ICD-10-CM

## 2014-06-11 DIAGNOSIS — D47Z9 Other specified neoplasms of uncertain behavior of lymphoid, hematopoietic and related tissue: Secondary | ICD-10-CM

## 2014-06-11 LAB — CBC WITH DIFFERENTIAL/PLATELET
BASO%: 1.8 % (ref 0.0–2.0)
Basophils Absolute: 0 10*3/uL (ref 0.0–0.1)
EOS%: 1.8 % (ref 0.0–7.0)
Eosinophils Absolute: 0 10*3/uL (ref 0.0–0.5)
HCT: 22.3 % — ABNORMAL LOW (ref 38.4–49.9)
HGB: 7.1 g/dL — ABNORMAL LOW (ref 13.0–17.1)
LYMPH%: 30.8 % (ref 14.0–49.0)
MCH: 28.6 pg (ref 27.2–33.4)
MCHC: 31.8 g/dL — AB (ref 32.0–36.0)
MCV: 89.9 fL (ref 79.3–98.0)
MONO#: 0.2 10*3/uL (ref 0.1–0.9)
MONO%: 6.8 % (ref 0.0–14.0)
NEUT#: 1.3 10*3/uL — ABNORMAL LOW (ref 1.5–6.5)
NEUT%: 58.8 % (ref 39.0–75.0)
NRBC: 2 % — AB (ref 0–0)
Platelets: 98 10*3/uL — ABNORMAL LOW (ref 140–400)
RBC: 2.48 10*6/uL — AB (ref 4.20–5.82)
RDW: 21.9 % — AB (ref 11.0–14.6)
WBC: 2.2 10*3/uL — ABNORMAL LOW (ref 4.0–10.3)
lymph#: 0.7 10*3/uL — ABNORMAL LOW (ref 0.9–3.3)

## 2014-06-11 LAB — TECHNOLOGIST REVIEW

## 2014-06-11 LAB — PREPARE RBC (CROSSMATCH)

## 2014-06-11 LAB — HOLD TUBE, BLOOD BANK

## 2014-06-11 MED ORDER — DIPHENHYDRAMINE HCL 25 MG PO CAPS
ORAL_CAPSULE | ORAL | Status: AC
  Filled 2014-06-11: qty 1

## 2014-06-11 MED ORDER — ACETAMINOPHEN 325 MG PO TABS
650.0000 mg | ORAL_TABLET | Freq: Once | ORAL | Status: AC
  Administered 2014-06-11: 650 mg via ORAL

## 2014-06-11 MED ORDER — SODIUM CHLORIDE 0.9 % IJ SOLN
10.0000 mL | INTRAMUSCULAR | Status: DC | PRN
  Filled 2014-06-11: qty 10

## 2014-06-11 MED ORDER — DARBEPOETIN ALFA-POLYSORBATE 300 MCG/0.6ML IJ SOLN
300.0000 ug | Freq: Once | INTRAMUSCULAR | Status: AC
  Administered 2014-06-11: 300 ug via SUBCUTANEOUS
  Filled 2014-06-11: qty 0.6

## 2014-06-11 MED ORDER — ACETAMINOPHEN 325 MG PO TABS
ORAL_TABLET | ORAL | Status: AC
  Filled 2014-06-11: qty 2

## 2014-06-11 MED ORDER — DIPHENHYDRAMINE HCL 25 MG PO CAPS
25.0000 mg | ORAL_CAPSULE | Freq: Once | ORAL | Status: AC
  Administered 2014-06-11: 25 mg via ORAL

## 2014-06-11 MED ORDER — SODIUM CHLORIDE 0.9 % IV SOLN
250.0000 mL | Freq: Once | INTRAVENOUS | Status: AC
  Administered 2014-06-11: 250 mL via INTRAVENOUS

## 2014-06-11 MED ORDER — SODIUM CHLORIDE 0.9 % IV SOLN: 250.0000 mL | Freq: Once | INTRAVENOUS | Status: DC

## 2014-06-11 NOTE — Patient Instructions (Signed)

## 2014-06-12 LAB — TYPE AND SCREEN
ABO/RH(D): O POS
ANTIBODY SCREEN: NEGATIVE
UNIT DIVISION: 0
Unit division: 0

## 2014-06-21 ENCOUNTER — Ambulatory Visit (HOSPITAL_COMMUNITY)
Admission: RE | Admit: 2014-06-21 | Discharge: 2014-06-21 | Disposition: A | Discharge status: Home / Self Care | Payer: Medicare Other | Source: Ambulatory Visit | Attending: Oncology | Admitting: Oncology

## 2014-06-21 DIAGNOSIS — D539 Nutritional anemia, unspecified: Secondary | ICD-10-CM | POA: Insufficient documentation

## 2014-06-21 DIAGNOSIS — D471 Chronic myeloproliferative disease: Secondary | ICD-10-CM

## 2014-06-21 DIAGNOSIS — D531 Other megaloblastic anemias, not elsewhere classified: Secondary | ICD-10-CM

## 2014-06-21 DIAGNOSIS — D47Z9 Other specified neoplasms of uncertain behavior of lymphoid, hematopoietic and related tissue: Secondary | ICD-10-CM | POA: Insufficient documentation

## 2014-06-22 ENCOUNTER — Other Ambulatory Visit: Payer: Self-pay

## 2014-06-25 ENCOUNTER — Ambulatory Visit (HOSPITAL_BASED_OUTPATIENT_CLINIC_OR_DEPARTMENT_OTHER): Payer: Medicare Other

## 2014-06-25 ENCOUNTER — Other Ambulatory Visit: Payer: Self-pay | Admitting: Oncology

## 2014-06-25 ENCOUNTER — Ambulatory Visit: Payer: Medicare Other

## 2014-06-25 ENCOUNTER — Other Ambulatory Visit (HOSPITAL_BASED_OUTPATIENT_CLINIC_OR_DEPARTMENT_OTHER): Payer: Medicare Other

## 2014-06-25 VITALS — BP 119/41 | HR 74 | Temp 98.0°F | Resp 20

## 2014-06-25 DIAGNOSIS — D649 Anemia, unspecified: Secondary | ICD-10-CM

## 2014-06-25 DIAGNOSIS — D696 Thrombocytopenia, unspecified: Secondary | ICD-10-CM

## 2014-06-25 DIAGNOSIS — D47Z9 Other specified neoplasms of uncertain behavior of lymphoid, hematopoietic and related tissue: Secondary | ICD-10-CM | POA: Diagnosis not present

## 2014-06-25 DIAGNOSIS — D471 Chronic myeloproliferative disease: Secondary | ICD-10-CM

## 2014-06-25 DIAGNOSIS — D531 Other megaloblastic anemias, not elsewhere classified: Secondary | ICD-10-CM | POA: Diagnosis not present

## 2014-06-25 DIAGNOSIS — D539 Nutritional anemia, unspecified: Secondary | ICD-10-CM

## 2014-06-25 LAB — CBC WITH DIFFERENTIAL/PLATELET
BASO%: 1.9 % (ref 0.0–2.0)
Basophils Absolute: 0.1 10*3/uL (ref 0.0–0.1)
EOS%: 1.1 % (ref 0.0–7.0)
Eosinophils Absolute: 0 10*3/uL (ref 0.0–0.5)
HEMATOCRIT: 21.6 % — AB (ref 38.4–49.9)
HGB: 6.7 g/dL — CL (ref 13.0–17.1)
LYMPH#: 0.7 10*3/uL — AB (ref 0.9–3.3)
LYMPH%: 25.2 % (ref 14.0–49.0)
MCH: 27.5 pg (ref 27.2–33.4)
MCHC: 31 g/dL — AB (ref 32.0–36.0)
MCV: 88.5 fL (ref 79.3–98.0)
MONO#: 0.2 10*3/uL (ref 0.1–0.9)
MONO%: 8.4 % (ref 0.0–14.0)
NEUT#: 1.7 10*3/uL (ref 1.5–6.5)
NEUT%: 63.4 % (ref 39.0–75.0)
NRBC: 4 % — AB (ref 0–0)
Platelets: 129 10*3/uL — ABNORMAL LOW (ref 140–400)
RBC: 2.44 10*6/uL — AB (ref 4.20–5.82)
RDW: 22.4 % — ABNORMAL HIGH (ref 11.0–14.6)
WBC: 2.6 10*3/uL — ABNORMAL LOW (ref 4.0–10.3)

## 2014-06-25 LAB — PREPARE RBC (CROSSMATCH)

## 2014-06-25 LAB — HOLD TUBE, BLOOD BANK

## 2014-06-25 LAB — TECHNOLOGIST REVIEW

## 2014-06-25 MED ORDER — ACETAMINOPHEN 325 MG PO TABS
650.0000 mg | ORAL_TABLET | Freq: Once | ORAL | Status: AC
  Administered 2014-06-25: 650 mg via ORAL

## 2014-06-25 MED ORDER — DIPHENHYDRAMINE HCL 25 MG PO CAPS
ORAL_CAPSULE | ORAL | Status: AC
  Filled 2014-06-25: qty 1

## 2014-06-25 MED ORDER — ACETAMINOPHEN 325 MG PO TABS
ORAL_TABLET | ORAL | Status: AC
  Filled 2014-06-25: qty 2

## 2014-06-25 MED ORDER — SODIUM CHLORIDE 0.9 % IV SOLN
250.0000 mL | Freq: Once | INTRAVENOUS | Status: AC
  Administered 2014-06-25: 250 mL via INTRAVENOUS

## 2014-06-25 MED ORDER — DARBEPOETIN ALFA-POLYSORBATE 300 MCG/0.6ML IJ SOLN
300.0000 ug | Freq: Once | INTRAMUSCULAR | Status: AC
  Administered 2014-06-25: 300 ug via SUBCUTANEOUS
  Filled 2014-06-25: qty 0.6

## 2014-06-25 MED ORDER — SODIUM CHLORIDE 0.9 % IJ SOLN
3.0000 mL | INTRAMUSCULAR | Status: DC | PRN
  Filled 2014-06-25: qty 10

## 2014-06-25 MED ORDER — HEPARIN SOD (PORK) LOCK FLUSH 100 UNIT/ML IV SOLN
250.0000 [IU] | INTRAVENOUS | Status: DC | PRN
  Filled 2014-06-25: qty 5

## 2014-06-25 MED ORDER — DIPHENHYDRAMINE HCL 25 MG PO CAPS
25.0000 mg | ORAL_CAPSULE | Freq: Once | ORAL | Status: AC
  Administered 2014-06-25: 25 mg via ORAL

## 2014-06-25 NOTE — Patient Instructions (Signed)

## 2014-06-26 LAB — TYPE AND SCREEN
ABO/RH(D): O POS
Antibody Screen: NEGATIVE
UNIT DIVISION: 0
Unit division: 0

## 2014-07-09 ENCOUNTER — Other Ambulatory Visit: Payer: Self-pay | Admitting: *Deleted

## 2014-07-09 ENCOUNTER — Ambulatory Visit (HOSPITAL_BASED_OUTPATIENT_CLINIC_OR_DEPARTMENT_OTHER): Payer: Medicare Other | Admitting: Oncology

## 2014-07-09 ENCOUNTER — Telehealth: Payer: Self-pay | Admitting: Oncology

## 2014-07-09 ENCOUNTER — Ambulatory Visit (HOSPITAL_BASED_OUTPATIENT_CLINIC_OR_DEPARTMENT_OTHER): Payer: Medicare Other

## 2014-07-09 ENCOUNTER — Other Ambulatory Visit (HOSPITAL_BASED_OUTPATIENT_CLINIC_OR_DEPARTMENT_OTHER): Payer: Medicare Other

## 2014-07-09 ENCOUNTER — Ambulatory Visit: Payer: Medicare Other

## 2014-07-09 VITALS — BP 112/90 | HR 68 | Temp 98.4°F | Resp 16

## 2014-07-09 VITALS — BP 129/49 | HR 86 | Temp 98.5°F | Resp 18 | Ht 69.0 in | Wt 173.0 lb

## 2014-07-09 DIAGNOSIS — D649 Anemia, unspecified: Secondary | ICD-10-CM

## 2014-07-09 DIAGNOSIS — D61818 Other pancytopenia: Secondary | ICD-10-CM

## 2014-07-09 DIAGNOSIS — D47Z9 Other specified neoplasms of uncertain behavior of lymphoid, hematopoietic and related tissue: Secondary | ICD-10-CM

## 2014-07-09 DIAGNOSIS — D471 Chronic myeloproliferative disease: Secondary | ICD-10-CM

## 2014-07-09 DIAGNOSIS — D531 Other megaloblastic anemias, not elsewhere classified: Secondary | ICD-10-CM

## 2014-07-09 LAB — CBC WITH DIFFERENTIAL/PLATELET
BASO%: 2.2 % — ABNORMAL HIGH (ref 0.0–2.0)
BASOS ABS: 0.1 10*3/uL (ref 0.0–0.1)
EOS ABS: 0 10*3/uL (ref 0.0–0.5)
EOS%: 1.8 % (ref 0.0–7.0)
HEMATOCRIT: 24.3 % — AB (ref 38.4–49.9)
HEMOGLOBIN: 7.5 g/dL — AB (ref 13.0–17.1)
LYMPH%: 28.5 % (ref 14.0–49.0)
MCH: 27.4 pg (ref 27.2–33.4)
MCHC: 30.9 g/dL — ABNORMAL LOW (ref 32.0–36.0)
MCV: 88.7 fL (ref 79.3–98.0)
MONO#: 0.2 10*3/uL (ref 0.1–0.9)
MONO%: 7.5 % (ref 0.0–14.0)
NEUT%: 60 % (ref 39.0–75.0)
NEUTROS ABS: 1.4 10*3/uL — AB (ref 1.5–6.5)
Platelets: 93 10*3/uL — ABNORMAL LOW (ref 140–400)
RBC: 2.74 10*6/uL — ABNORMAL LOW (ref 4.20–5.82)
RDW: 22.7 % — ABNORMAL HIGH (ref 11.0–14.6)
WBC: 2.3 10*3/uL — ABNORMAL LOW (ref 4.0–10.3)
lymph#: 0.7 10*3/uL — ABNORMAL LOW (ref 0.9–3.3)
nRBC: 3 % — ABNORMAL HIGH (ref 0–0)

## 2014-07-09 LAB — TECHNOLOGIST REVIEW

## 2014-07-09 LAB — PREPARE RBC (CROSSMATCH)

## 2014-07-09 LAB — HOLD TUBE, BLOOD BANK

## 2014-07-09 MED ORDER — DARBEPOETIN ALFA-POLYSORBATE 300 MCG/0.6ML IJ SOLN
300.0000 ug | Freq: Once | INTRAMUSCULAR | Status: AC
  Administered 2014-07-09: 300 ug via SUBCUTANEOUS
  Filled 2014-07-09: qty 0.6

## 2014-07-09 MED ORDER — DIPHENHYDRAMINE HCL 25 MG PO CAPS
ORAL_CAPSULE | ORAL | Status: AC
  Filled 2014-07-09: qty 1

## 2014-07-09 MED ORDER — ACETAMINOPHEN 325 MG PO TABS
650.0000 mg | ORAL_TABLET | Freq: Once | ORAL | Status: AC
  Administered 2014-07-09: 650 mg via ORAL

## 2014-07-09 MED ORDER — DIPHENHYDRAMINE HCL 25 MG PO CAPS
25.0000 mg | ORAL_CAPSULE | Freq: Once | ORAL | Status: AC
  Administered 2014-07-09: 25 mg via ORAL

## 2014-07-09 MED ORDER — HEPARIN SOD (PORK) LOCK FLUSH 100 UNIT/ML IV SOLN
250.0000 [IU] | INTRAVENOUS | Status: DC | PRN
  Filled 2014-07-09: qty 5

## 2014-07-09 MED ORDER — ACETAMINOPHEN 325 MG PO TABS
ORAL_TABLET | ORAL | Status: AC
  Filled 2014-07-09: qty 2

## 2014-07-09 MED ORDER — SODIUM CHLORIDE 0.9 % IV SOLN: 250.0000 mL | Freq: Once | INTRAVENOUS | Status: DC

## 2014-07-09 NOTE — Addendum Note (Signed)
Addended by: Wyatt Portela on: 07/09/2014 11:22 AM   Modules accepted: Orders

## 2014-07-09 NOTE — Patient Instructions (Signed)

## 2014-07-09 NOTE — Telephone Encounter (Signed)
gv and printed appt sched and avs for pt for Sept and OCT....sed added tx °

## 2014-07-09 NOTE — Progress Notes (Signed)
Hematology and Oncology Follow Up Visit  Kyle Heath [id number] 11/28/1926 80 y.o. 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 He he might have an element of myelodysplastic syndrome as well. Bone marrow biopsy in 01/2012 confirmed the diagnosis.   Current therapy:  Supportive transfusions every 2-4 weeks as needed and Aransep 300 mcg every two weeks.   Interim History:  Kyle Heath presents today for a routine visit with his daughter. He continues to be transfusion dependent but remains asymptomatic at this time. He continues to show excellent quality of life. He is well and maintains his weight. Did not report any complications from growth factor supports her transfusions. He continues to ambulate without the aid of a walker without any difficulty. He has not reported any falls or syncope. He has not reported any chest pain or shortness of breath or dyspnea on exertion His level of fatigue remains at about baseline. Denies chest pain, shortness of breath, and dyspnea.  He is not reporting any recent hospitalization or emesis. Is not reporting any recent bleeding or infections. He has not reported any hematochezia or melena. Has not reported any frequency urgency or hesitancy. Has not reported any lymphadenopathy or rash. Has not reported any constitutional symptoms weight loss or appetite changes. His quality of life remains about the same.   Medications: I have reviewed the patient's current medications. Current Outpatient Prescriptions  Medication Sig Dispense Refill  . aspirin 81 MG tablet Take 81 mg by mouth daily after breakfast.       . carvedilol (COREG) 25 MG tablet Take 1 tablet (25 mg total) by mouth 2 (two) times daily with a meal.  60 tablet  0  . diltiazem (CARDIZEM CD) 360 MG 24 hr capsule Take 1 capsule (360 mg total) by mouth daily.  30 capsule  2  . febuxostat (ULORIC) 40 MG tablet Take 80 mg by mouth  daily.       . furosemide (LASIX) 40 MG tablet Take 1 tablet (40 mg total) by mouth daily.  30 tablet  1  . levothyroxine (SYNTHROID, LEVOTHROID) 88 MCG tablet Take 88 mcg by mouth daily before breakfast.      . losartan (COZAAR) 100 MG tablet Take 50 mg by mouth daily.       . Multiple Vitamin (MULTIVITAMIN WITH MINERALS) TABS Take 1 tablet by mouth daily.      . potassium chloride SA (K-DUR,KLOR-CON) 20 MEQ tablet Take 1 tablet (20 mEq total) by mouth daily.  30 tablet  1  . TAZTIA XT 360 MG 24 hr capsule Take 360 mg by mouth daily.       No current facility-administered medications for this visit.   Facility-Administered Medications Ordered in Other Visits  Medication Dose Route Frequency Provider Last Rate Last Dose  . furosemide (LASIX) injection 20 mg  20 mg Intravenous Once [name], NP         Allergies: No Known Allergies  Past Medical History, Surgical history, Social history, and Family History were reviewed and updated.   Physical Exam: Blood pressure 129/49, pulse 86, temperature 98.5 F (36.9 C), temperature source Oral, resp. rate 18, height 5' 9"  (1.753 m), weight 173 lb (78.472 kg).  ECOG: 1  General appearance: alert. Not in any distress. Head: Normocephalic, without obvious abnormality, atraumatic Neck: no adenopathy Lymph nodes: Cervical, supraclavicular, and axillary nodes normal. Heart:regular rate and rhythm, S1, S2. Lung:chest clear, no wheezing, rales,  normal symmetric air entry Abdomen: soft, non-tender, without masses or organomegaly EXT:no erythema, induration, or nodules. Trace edema noted in his lower extremities bilaterally.  Lab Results:  CBC    Component Value Date/Time   WBC 2.3* 07/09/2014 0921   WBC 2.3* 01/21/2013 1431   RBC 2.74* 07/09/2014 0921   RBC 3.12* 01/21/2013 1431   HGB 7.5* 07/09/2014 0921   HGB 9.0* 01/21/2013 1431   HCT 24.3* 07/09/2014 0921   HCT 27.4* 01/21/2013 1431   PLT 93* 07/09/2014 0921   PLT 217 01/21/2013 1431    MCV 88.7 07/09/2014 0921   MCV 87.8 01/21/2013 1431   MCH 27.4 07/09/2014 0921   MCH 28.8 01/21/2013 1431   MCHC 30.9* 07/09/2014 0921   MCHC 32.8 01/21/2013 1431   RDW 22.7* 07/09/2014 0921   RDW 20.3* 01/21/2013 1431   LYMPHSABS 0.7* 07/09/2014 0921   MONOABS 0.2 07/09/2014 0921   EOSABS 0.0 07/09/2014 0921   BASOSABS 0.1 07/09/2014 0921      Impression and Plan:  80 year old gentleman with the following issues:  1. 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 He could also have an element of myelodysplastic syndrome as well. He is currently on supportive care only. He does not have abdominal pain or a large spleen. His hemoglobin is low today and will require packed red cell transfusion. We will continue with growth factor support every 2 weeks. His quality of life is maintained with the supportive measures.  2. Neutropenia: Stable he has not had any recurrent infections. May need to add GSF if absolute neutrophil count is 0.5 or less  3. Thrombocytopenia : Stable. No active bleeding. No transfusion indicated. His platelet count has been relatively stable.  4. HTN: On Cardizem and Cozaar per PCP.  5. IV Access: Poor peripheral IV access over the patient prefers to not pursue a Port-A-Cath at this time.   6. Followup: Every 2 weeks for lab, injection, and possible transfusion. He will have a clinical evaluation in 8 weeks.  [facility], MD 8/21/20159:52 AM

## 2014-07-10 LAB — TYPE AND SCREEN
ABO/RH(D): O POS
ANTIBODY SCREEN: NEGATIVE
Unit division: 0
Unit division: 0

## 2014-07-20 ENCOUNTER — Ambulatory Visit (HOSPITAL_COMMUNITY)
Admission: RE | Admit: 2014-07-20 | Discharge: 2014-07-20 | Disposition: A | Discharge status: Home / Self Care | Payer: Medicare Other | Source: Ambulatory Visit | Attending: Oncology | Admitting: Oncology

## 2014-07-20 DIAGNOSIS — D649 Anemia, unspecified: Secondary | ICD-10-CM | POA: Insufficient documentation

## 2014-07-23 ENCOUNTER — Other Ambulatory Visit (HOSPITAL_BASED_OUTPATIENT_CLINIC_OR_DEPARTMENT_OTHER): Payer: Medicare Other

## 2014-07-23 ENCOUNTER — Ambulatory Visit: Payer: Medicare Other

## 2014-07-23 ENCOUNTER — Ambulatory Visit (HOSPITAL_BASED_OUTPATIENT_CLINIC_OR_DEPARTMENT_OTHER): Payer: Medicare Other

## 2014-07-23 VITALS — BP 114/39 | HR 76 | Temp 98.3°F

## 2014-07-23 DIAGNOSIS — D471 Chronic myeloproliferative disease: Secondary | ICD-10-CM

## 2014-07-23 DIAGNOSIS — D47Z9 Other specified neoplasms of uncertain behavior of lymphoid, hematopoietic and related tissue: Secondary | ICD-10-CM

## 2014-07-23 DIAGNOSIS — D649 Anemia, unspecified: Secondary | ICD-10-CM

## 2014-07-23 DIAGNOSIS — D531 Other megaloblastic anemias, not elsewhere classified: Secondary | ICD-10-CM

## 2014-07-23 DIAGNOSIS — D638 Anemia in other chronic diseases classified elsewhere: Secondary | ICD-10-CM

## 2014-07-23 LAB — CBC WITH DIFFERENTIAL/PLATELET
BASO%: 2.1 % — ABNORMAL HIGH (ref 0.0–2.0)
Basophils Absolute: 0.1 10*3/uL (ref 0.0–0.1)
EOS%: 2.1 % (ref 0.0–7.0)
Eosinophils Absolute: 0.1 10*3/uL (ref 0.0–0.5)
HCT: 26 % — ABNORMAL LOW (ref 38.4–49.9)
HGB: 8.1 g/dL — ABNORMAL LOW (ref 13.0–17.1)
LYMPH%: 25.5 % (ref 14.0–49.0)
MCH: 27.4 pg (ref 27.2–33.4)
MCHC: 31.2 g/dL — AB (ref 32.0–36.0)
MCV: 87.8 fL (ref 79.3–98.0)
MONO#: 0.2 10*3/uL (ref 0.1–0.9)
MONO%: 8.6 % (ref 0.0–14.0)
NEUT#: 1.5 10*3/uL (ref 1.5–6.5)
NEUT%: 61.7 % (ref 39.0–75.0)
NRBC: 2 % — AB (ref 0–0)
Platelets: 97 10*3/uL — ABNORMAL LOW (ref 140–400)
RBC: 2.96 10*6/uL — AB (ref 4.20–5.82)
RDW: 22.5 % — ABNORMAL HIGH (ref 11.0–14.6)
WBC: 2.4 10*3/uL — ABNORMAL LOW (ref 4.0–10.3)
lymph#: 0.6 10*3/uL — ABNORMAL LOW (ref 0.9–3.3)

## 2014-07-23 LAB — TECHNOLOGIST REVIEW: Technologist Review: 2

## 2014-07-23 LAB — HOLD TUBE, BLOOD BANK

## 2014-07-23 MED ORDER — DARBEPOETIN ALFA-POLYSORBATE 300 MCG/0.6ML IJ SOLN
300.0000 ug | Freq: Once | INTRAMUSCULAR | Status: AC
  Administered 2014-07-23: 300 ug via SUBCUTANEOUS
  Filled 2014-07-23: qty 0.6

## 2014-07-23 NOTE — Progress Notes (Signed)
Per Dr. [name], patient does not need blood transfusion today. Patient met in lab and given copy of labs and told to return as scheduled. Patient appreciative and agreeable to return as scheduled.

## 2014-08-06 ENCOUNTER — Ambulatory Visit: Payer: Medicare Other

## 2014-08-06 ENCOUNTER — Other Ambulatory Visit: Payer: Self-pay | Admitting: *Deleted

## 2014-08-06 ENCOUNTER — Other Ambulatory Visit (HOSPITAL_BASED_OUTPATIENT_CLINIC_OR_DEPARTMENT_OTHER): Payer: Medicare Other

## 2014-08-06 ENCOUNTER — Ambulatory Visit (HOSPITAL_BASED_OUTPATIENT_CLINIC_OR_DEPARTMENT_OTHER): Payer: Medicare Other

## 2014-08-06 VITALS — BP 121/41 | HR 63 | Temp 97.9°F | Resp 18

## 2014-08-06 DIAGNOSIS — D531 Other megaloblastic anemias, not elsewhere classified: Secondary | ICD-10-CM

## 2014-08-06 DIAGNOSIS — D649 Anemia, unspecified: Secondary | ICD-10-CM

## 2014-08-06 DIAGNOSIS — D471 Chronic myeloproliferative disease: Secondary | ICD-10-CM

## 2014-08-06 LAB — CBC WITH DIFFERENTIAL/PLATELET
BASO%: 3.1 % — ABNORMAL HIGH (ref 0.0–2.0)
BASOS ABS: 0.1 10*3/uL (ref 0.0–0.1)
EOS%: 0.9 % (ref 0.0–7.0)
Eosinophils Absolute: 0 10*3/uL (ref 0.0–0.5)
HCT: 20 % — ABNORMAL LOW (ref 38.4–49.9)
HGB: 6.2 g/dL — CL (ref 13.0–17.1)
LYMPH%: 30.9 % (ref 14.0–49.0)
MCH: 27.2 pg (ref 27.2–33.4)
MCHC: 31 g/dL — ABNORMAL LOW (ref 32.0–36.0)
MCV: 87.7 fL (ref 79.3–98.0)
MONO#: 0.2 10*3/uL (ref 0.1–0.9)
MONO%: 7.2 % (ref 0.0–14.0)
NEUT#: 1.3 10*3/uL — ABNORMAL LOW (ref 1.5–6.5)
NEUT%: 57.9 % (ref 39.0–75.0)
Platelets: 99 10*3/uL — ABNORMAL LOW (ref 140–400)
RBC: 2.28 10*6/uL — ABNORMAL LOW (ref 4.20–5.82)
RDW: 24.8 % — AB (ref 11.0–14.6)
WBC: 2.2 10*3/uL — ABNORMAL LOW (ref 4.0–10.3)
lymph#: 0.7 10*3/uL — ABNORMAL LOW (ref 0.9–3.3)
nRBC: 3 % — ABNORMAL HIGH (ref 0–0)

## 2014-08-06 LAB — PREPARE RBC (CROSSMATCH)

## 2014-08-06 LAB — HOLD TUBE, BLOOD BANK

## 2014-08-06 LAB — TECHNOLOGIST REVIEW: Technologist Review: 5

## 2014-08-06 MED ORDER — ACETAMINOPHEN 325 MG PO TABS
650.0000 mg | ORAL_TABLET | Freq: Once | ORAL | Status: AC
  Administered 2014-08-06: 650 mg via ORAL

## 2014-08-06 MED ORDER — DIPHENHYDRAMINE HCL 25 MG PO CAPS
ORAL_CAPSULE | ORAL | Status: AC
  Filled 2014-08-06: qty 1

## 2014-08-06 MED ORDER — DIPHENHYDRAMINE HCL 25 MG PO CAPS
25.0000 mg | ORAL_CAPSULE | Freq: Once | ORAL | Status: AC
  Administered 2014-08-06: 25 mg via ORAL

## 2014-08-06 MED ORDER — SODIUM CHLORIDE 0.9 % IV SOLN: 250.0000 mL | Freq: Once | INTRAVENOUS | Status: DC

## 2014-08-06 MED ORDER — ACETAMINOPHEN 325 MG PO TABS
ORAL_TABLET | ORAL | Status: AC
  Filled 2014-08-06: qty 2

## 2014-08-06 MED ORDER — SODIUM CHLORIDE 0.9 % IV SOLN
250.0000 mL | Freq: Once | INTRAVENOUS | Status: AC
  Administered 2014-08-06: 250 mL via INTRAVENOUS

## 2014-08-06 NOTE — Progress Notes (Signed)
VSS pre blood transfusion.  Reports he took anti-hypertensive this morning.

## 2014-08-06 NOTE — Progress Notes (Signed)
VSS.  "Everythying is good , I don't feel anything different."  Blood rate increased to 185 ml/hr.

## 2014-08-06 NOTE — Progress Notes (Signed)
Second unit blood started at 50 ml/hr at 1300.  VSS and tolerating transfusion well.  Rate increased to 185 ml/hr.

## 2014-08-06 NOTE — Progress Notes (Signed)
First unit blood started at 1026 am.  Reviewed side effects of possible blood reaction with him and no questions. Called Lisa at 1440 at [phone].  Voice mail left requesting pick up in 30 minutes.  Called [name] at [phone] with same request letting her know a voicemail was left with sister.  [name] says she will be here at 3:30.

## 2014-08-06 NOTE — Progress Notes (Signed)
Second unit blood infusing well at 185 ml/hr and continues.

## 2014-08-06 NOTE — Progress Notes (Signed)
1st unit blood ended, VSS

## 2014-08-06 NOTE — Progress Notes (Signed)
Blood continues at 185 ml/hr.   No distress reported.

## 2014-08-06 NOTE — Patient Instructions (Signed)

## 2014-08-07 LAB — TYPE AND SCREEN
ABO/RH(D): O POS
ANTIBODY SCREEN: NEGATIVE
UNIT DIVISION: 0
Unit division: 0

## 2014-08-19 ENCOUNTER — Ambulatory Visit (HOSPITAL_COMMUNITY)
Admission: RE | Admit: 2014-08-19 | Discharge: 2014-08-19 | Disposition: A | Discharge status: Home / Self Care | Payer: Medicare Other | Source: Ambulatory Visit | Attending: Oncology | Admitting: Oncology

## 2014-08-19 DIAGNOSIS — D649 Anemia, unspecified: Secondary | ICD-10-CM

## 2014-08-19 DIAGNOSIS — D6489 Other specified anemias: Secondary | ICD-10-CM

## 2014-08-19 DIAGNOSIS — Z79899 Other long term (current) drug therapy: Secondary | ICD-10-CM | POA: Insufficient documentation

## 2014-08-19 DIAGNOSIS — D471 Chronic myeloproliferative disease: Secondary | ICD-10-CM

## 2014-08-19 DIAGNOSIS — D469 Myelodysplastic syndrome, unspecified: Secondary | ICD-10-CM | POA: Insufficient documentation

## 2014-08-19 DIAGNOSIS — D630 Anemia in neoplastic disease: Secondary | ICD-10-CM

## 2014-08-19 DIAGNOSIS — D7581 Myelofibrosis: Secondary | ICD-10-CM | POA: Insufficient documentation

## 2014-08-20 ENCOUNTER — Ambulatory Visit: Payer: Medicare Other

## 2014-08-20 ENCOUNTER — Other Ambulatory Visit (HOSPITAL_BASED_OUTPATIENT_CLINIC_OR_DEPARTMENT_OTHER): Payer: Medicare Other

## 2014-08-20 ENCOUNTER — Ambulatory Visit (HOSPITAL_BASED_OUTPATIENT_CLINIC_OR_DEPARTMENT_OTHER): Payer: Medicare Other

## 2014-08-20 ENCOUNTER — Other Ambulatory Visit: Payer: Self-pay | Admitting: Medical Oncology

## 2014-08-20 VITALS — BP 120/40 | HR 61 | Temp 98.0°F | Resp 19

## 2014-08-20 DIAGNOSIS — D649 Anemia, unspecified: Secondary | ICD-10-CM

## 2014-08-20 DIAGNOSIS — D47Z9 Other specified neoplasms of uncertain behavior of lymphoid, hematopoietic and related tissue: Secondary | ICD-10-CM

## 2014-08-20 DIAGNOSIS — D6489 Other specified anemias: Secondary | ICD-10-CM

## 2014-08-20 DIAGNOSIS — D7581 Myelofibrosis: Secondary | ICD-10-CM | POA: Diagnosis not present

## 2014-08-20 DIAGNOSIS — Z79899 Other long term (current) drug therapy: Secondary | ICD-10-CM | POA: Diagnosis not present

## 2014-08-20 DIAGNOSIS — D469 Myelodysplastic syndrome, unspecified: Secondary | ICD-10-CM | POA: Diagnosis not present

## 2014-08-20 DIAGNOSIS — D61818 Other pancytopenia: Secondary | ICD-10-CM | POA: Diagnosis present

## 2014-08-20 DIAGNOSIS — D531 Other megaloblastic anemias, not elsewhere classified: Secondary | ICD-10-CM

## 2014-08-20 DIAGNOSIS — D471 Chronic myeloproliferative disease: Secondary | ICD-10-CM

## 2014-08-20 LAB — TECHNOLOGIST REVIEW: Technologist Review: 6

## 2014-08-20 LAB — CBC WITH DIFFERENTIAL/PLATELET
BASO%: 2.3 % — ABNORMAL HIGH (ref 0.0–2.0)
BASOS ABS: 0 10*3/uL (ref 0.0–0.1)
EOS ABS: 0 10*3/uL (ref 0.0–0.5)
EOS%: 1.2 % (ref 0.0–7.0)
HEMATOCRIT: 21.3 % — AB (ref 38.4–49.9)
HEMOGLOBIN: 6.9 g/dL — AB (ref 13.0–17.1)
LYMPH#: 0.5 10*3/uL — AB (ref 0.9–3.3)
LYMPH%: 28 % (ref 14.0–49.0)
MCH: 28.4 pg (ref 27.2–33.4)
MCHC: 32.2 g/dL (ref 32.0–36.0)
MCV: 88.3 fL (ref 79.3–98.0)
MONO#: 0.1 10*3/uL (ref 0.1–0.9)
MONO%: 7.1 % (ref 0.0–14.0)
NEUT%: 61.4 % (ref 39.0–75.0)
NEUTROS ABS: 1.2 10*3/uL — AB (ref 1.5–6.5)
Platelets: 103 10*3/uL — ABNORMAL LOW (ref 140–400)
RBC: 2.41 10*6/uL — ABNORMAL LOW (ref 4.20–5.82)
RDW: 22.3 % — ABNORMAL HIGH (ref 11.0–14.6)
WBC: 1.9 10*3/uL — ABNORMAL LOW (ref 4.0–10.3)

## 2014-08-20 LAB — PREPARE RBC (CROSSMATCH)

## 2014-08-20 LAB — HOLD TUBE, BLOOD BANK

## 2014-08-20 MED ORDER — DIPHENHYDRAMINE HCL 25 MG PO CAPS
ORAL_CAPSULE | ORAL | Status: AC
  Filled 2014-08-20: qty 1

## 2014-08-20 MED ORDER — SODIUM CHLORIDE 0.9 % IV SOLN
250.0000 mL | Freq: Once | INTRAVENOUS | Status: AC
  Administered 2014-08-20: 250 mL via INTRAVENOUS

## 2014-08-20 MED ORDER — DIPHENHYDRAMINE HCL 25 MG PO CAPS
25.0000 mg | ORAL_CAPSULE | Freq: Once | ORAL | Status: AC
  Administered 2014-08-20: 25 mg via ORAL

## 2014-08-20 MED ORDER — DARBEPOETIN ALFA-POLYSORBATE 300 MCG/0.6ML IJ SOLN
300.0000 ug | Freq: Once | INTRAMUSCULAR | Status: AC
Start: 2014-08-20 — End: 2014-08-20
  Administered 2014-08-20: 300 ug via SUBCUTANEOUS
  Filled 2014-08-20: qty 0.6

## 2014-08-20 MED ORDER — ACETAMINOPHEN 325 MG PO TABS
ORAL_TABLET | ORAL | Status: AC
  Filled 2014-08-20: qty 2

## 2014-08-20 MED ORDER — ACETAMINOPHEN 325 MG PO TABS
650.0000 mg | ORAL_TABLET | Freq: Once | ORAL | Status: AC
  Administered 2014-08-20: 650 mg via ORAL

## 2014-08-20 NOTE — Patient Instructions (Signed)

## 2014-08-22 LAB — TYPE AND SCREEN
ABO/RH(D): O POS
ANTIBODY SCREEN: NEGATIVE
UNIT DIVISION: 0
Unit division: 0

## 2014-09-03 ENCOUNTER — Other Ambulatory Visit: Payer: Self-pay | Admitting: Medical Oncology

## 2014-09-03 ENCOUNTER — Ambulatory Visit (HOSPITAL_BASED_OUTPATIENT_CLINIC_OR_DEPARTMENT_OTHER): Payer: Medicare Other

## 2014-09-03 ENCOUNTER — Telehealth: Payer: Self-pay | Admitting: Oncology

## 2014-09-03 ENCOUNTER — Ambulatory Visit (HOSPITAL_BASED_OUTPATIENT_CLINIC_OR_DEPARTMENT_OTHER): Payer: Medicare Other | Admitting: Physician Assistant

## 2014-09-03 ENCOUNTER — Ambulatory Visit: Payer: Medicare Other

## 2014-09-03 ENCOUNTER — Other Ambulatory Visit (HOSPITAL_BASED_OUTPATIENT_CLINIC_OR_DEPARTMENT_OTHER): Payer: Medicare Other

## 2014-09-03 VITALS — BP 130/45 | HR 71 | Temp 98.0°F | Resp 18

## 2014-09-03 VITALS — BP 124/40 | HR 91 | Temp 98.7°F | Resp 18 | Ht 69.0 in | Wt 172.1 lb

## 2014-09-03 DIAGNOSIS — D531 Other megaloblastic anemias, not elsewhere classified: Secondary | ICD-10-CM

## 2014-09-03 DIAGNOSIS — D479 Neoplasm of uncertain behavior of lymphoid, hematopoietic and related tissue, unspecified: Secondary | ICD-10-CM

## 2014-09-03 DIAGNOSIS — D630 Anemia in neoplastic disease: Secondary | ICD-10-CM

## 2014-09-03 DIAGNOSIS — D649 Anemia, unspecified: Secondary | ICD-10-CM

## 2014-09-03 DIAGNOSIS — I10 Essential (primary) hypertension: Secondary | ICD-10-CM

## 2014-09-03 DIAGNOSIS — D696 Thrombocytopenia, unspecified: Secondary | ICD-10-CM

## 2014-09-03 DIAGNOSIS — D469 Myelodysplastic syndrome, unspecified: Secondary | ICD-10-CM | POA: Diagnosis not present

## 2014-09-03 DIAGNOSIS — D471 Chronic myeloproliferative disease: Secondary | ICD-10-CM

## 2014-09-03 DIAGNOSIS — D709 Neutropenia, unspecified: Secondary | ICD-10-CM

## 2014-09-03 LAB — PREPARE RBC (CROSSMATCH)

## 2014-09-03 LAB — CBC WITH DIFFERENTIAL/PLATELET
BASO%: 1.7 % (ref 0.0–2.0)
BASOS ABS: 0 10*3/uL (ref 0.0–0.1)
EOS%: 1.3 % (ref 0.0–7.0)
Eosinophils Absolute: 0 10*3/uL (ref 0.0–0.5)
HCT: 21.4 % — ABNORMAL LOW (ref 38.4–49.9)
HEMOGLOBIN: 6.9 g/dL — AB (ref 13.0–17.1)
LYMPH%: 31.8 % (ref 14.0–49.0)
MCH: 28.6 pg (ref 27.2–33.4)
MCHC: 32.3 g/dL (ref 32.0–36.0)
MCV: 88.6 fL (ref 79.3–98.0)
MONO#: 0.1 10*3/uL (ref 0.1–0.9)
MONO%: 7.9 % (ref 0.0–14.0)
NEUT%: 57.3 % (ref 39.0–75.0)
NEUTROS ABS: 1 10*3/uL — AB (ref 1.5–6.5)
PLATELETS: 130 10*3/uL — AB (ref 140–400)
RBC: 2.42 10*6/uL — ABNORMAL LOW (ref 4.20–5.82)
RDW: 19.2 % — ABNORMAL HIGH (ref 11.0–14.6)
WBC: 1.8 10*3/uL — ABNORMAL LOW (ref 4.0–10.3)
lymph#: 0.6 10*3/uL — ABNORMAL LOW (ref 0.9–3.3)

## 2014-09-03 LAB — HOLD TUBE, BLOOD BANK

## 2014-09-03 LAB — TECHNOLOGIST REVIEW: Technologist Review: 2

## 2014-09-03 MED ORDER — SODIUM CHLORIDE 0.9 % IV SOLN
250.0000 mL | Freq: Once | INTRAVENOUS | Status: AC
  Administered 2014-09-03: 250 mL via INTRAVENOUS

## 2014-09-03 MED ORDER — DIPHENHYDRAMINE HCL 25 MG PO CAPS
ORAL_CAPSULE | ORAL | Status: AC
  Filled 2014-09-03: qty 1

## 2014-09-03 MED ORDER — DIPHENHYDRAMINE HCL 25 MG PO CAPS: 25.0000 mg | ORAL_CAPSULE | Freq: Once | ORAL | Status: DC

## 2014-09-03 MED ORDER — ACETAMINOPHEN 325 MG PO TABS
650.0000 mg | ORAL_TABLET | Freq: Once | ORAL | Status: AC
  Administered 2014-09-03: 650 mg via ORAL

## 2014-09-03 MED ORDER — ACETAMINOPHEN 325 MG PO TABS
ORAL_TABLET | ORAL | Status: AC
  Filled 2014-09-03: qty 2

## 2014-09-03 MED ORDER — DIPHENHYDRAMINE HCL 25 MG PO CAPS
25.0000 mg | ORAL_CAPSULE | Freq: Once | ORAL | Status: AC
  Administered 2014-09-03: 25 mg via ORAL

## 2014-09-03 NOTE — Telephone Encounter (Signed)
gv adn printed appt sched and avs for pt for NOV thru Dec...sed added tx.

## 2014-09-03 NOTE — Patient Instructions (Signed)

## 2014-09-04 LAB — TYPE AND SCREEN
ABO/RH(D): O POS
Antibody Screen: NEGATIVE
UNIT DIVISION: 0
Unit division: 0

## 2014-09-04 NOTE — Progress Notes (Signed)
Hematology and Oncology Follow Up Visit  Sajid Ruppert [id number] Nov 14, 1927 80 y.o. 09/04/2014 11:15 PM    Principle Diagnosis: 80 year old gentleman with diagnosis of pancytopenia  due to myelofibrosis. He he might have an element of myelodysplastic syndrome as well. Bone marrow biopsy in 01/2012 confirmed the diagnosis.   Current therapy:  Supportive transfusions every 2-4 weeks as needed and Aransep 300 mcg every two weeks.   Interim History:  Mr. Tabora presents today for a routine visit with his daughter. He continues to be transfusion dependent but remains asymptomatic at this time. He continues to show excellent quality of life. He is well and maintains his weight. Did not report any complications from growth factor supports or transfusions. He continues to ambulate without the aid of a walker without any difficulty. He has not reported any falls or syncope. He has not reported any chest pain or shortness of breath or dyspnea on exertion His level of fatigue remains at about baseline. Denies chest pain, shortness of breath, and dyspnea.  He is not reporting any recent hospitalization or emesis. Is not reporting any recent bleeding or infections. He has not reported any hematochezia or melena. Has not reported any frequency urgency or hesitancy. Has not reported any lymphadenopathy or rash. Has not reported any constitutional symptoms weight loss or appetite changes. His quality of life remains about the same. Remainder of his review of systems is unremarkable.   Medications: I have reviewed the patient's current medications. Current Outpatient Prescriptions  Medication Sig Dispense Refill  . aspirin 81 MG tablet Take 81 mg by mouth daily after breakfast.       . febuxostat (ULORIC) 40 MG tablet Take 80 mg by mouth daily.       [name] Kitchen levothyroxine (SYNTHROID, LEVOTHROID) 88 MCG tablet Take 88 mcg by mouth daily before breakfast.      . losartan (COZAAR) 100 MG tablet Take 50 mg by mouth daily.        . Multiple Vitamin (MULTIVITAMIN WITH MINERALS) TABS Take 1 tablet by mouth daily.      . potassium chloride SA (K-DUR,KLOR-CON) 20 MEQ tablet Take 1 tablet (20 mEq total) by mouth daily.  30 tablet  1  . TAZTIA XT 360 MG 24 hr capsule Take 360 mg by mouth daily.      . carvedilol (COREG) 25 MG tablet Take 1 tablet (25 mg total) by mouth 2 (two) times daily with a meal.  60 tablet  0  . diltiazem (CARDIZEM CD) 360 MG 24 hr capsule Take 1 capsule (360 mg total) by mouth daily.  30 capsule  2  . furosemide (LASIX) 40 MG tablet Take 1 tablet (40 mg total) by mouth daily.  30 tablet  1   No current facility-administered medications for this visit.   Facility-Administered Medications Ordered in Other Visits  Medication Dose Route Frequency Provider Last Rate Last Dose  . furosemide (LASIX) injection 20 mg  20 mg Intravenous Once [name], NP         Allergies: No Known Allergies  Past Medical History, Surgical history, Social history, and Family History were reviewed and updated.   Physical Exam: Blood pressure 124/40, pulse 91, temperature 98.7 F (37.1 C), temperature source Oral, resp. rate 18, height 5' 9"  (1.753 m), weight 172 lb 1.6 oz (78.064 kg), SpO2 100.00%.  ECOG: 1  General appearance: alert. Not in any distress. Head: Normocephalic, without obvious abnormality, atraumatic Neck: no adenopathy Lymph nodes: Cervical, supraclavicular, and axillary nodes  normal. Heart:regular rate and rhythm, S1, S2. Lung:chest clear, no wheezing, rales, normal symmetric air entry Abdomen: soft, non-tender, without masses or organomegaly EXT:no erythema, induration, or nodules. Trace edema noted in his lower extremities bilaterally.  Lab Results:  CBC    Component Value Date/Time   WBC 1.8* 09/03/2014 0904   WBC 2.3* 01/21/2013 1431   RBC 2.42* 09/03/2014 0904   RBC 3.12* 01/21/2013 1431   HGB 6.9* 09/03/2014 0904   HGB 9.0* 01/21/2013 1431   HCT 21.4* 09/03/2014 0904   HCT  27.4* 01/21/2013 1431   PLT 130* 09/03/2014 0904   PLT 217 01/21/2013 1431   MCV 88.6 09/03/2014 0904   MCV 87.8 01/21/2013 1431   MCH 28.6 09/03/2014 0904   MCH 28.8 01/21/2013 1431   MCHC 32.3 09/03/2014 0904   MCHC 32.8 01/21/2013 1431   RDW 19.2* 09/03/2014 0904   RDW 20.3* 01/21/2013 1431   LYMPHSABS 0.6* 09/03/2014 0904   MONOABS 0.1 09/03/2014 0904   EOSABS 0.0 09/03/2014 0904   BASOSABS 0.0 09/03/2014 0904      Impression and Plan:  80 year old gentleman with the following issues:  1. 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 He could also have an element of myelodysplastic syndrome as well. He is currently on supportive care only. He does not have abdominal pain or a large spleen. His hemoglobin is low today at 6.9 gm/dL and will require packed red cell transfusion. We will continue with growth factor support every 2 weeks. His quality of life is maintained with the supportive measures.  2. Neutropenia: Stable he has not had any recurrent infections. May need to add GSF if absolute neutrophil count is 0.5 or less  3. Thrombocytopenia : Stable. No active bleeding. No transfusion indicated. His platelet count has been relatively stable.  4. HTN: On Cardizem and Cozaar per PCP.  5. IV Access: Poor peripheral IV access over the patient prefers to not pursue a Port-A-Cath at this time.   6. Followup: Every 2 weeks for lab, injection, and possible transfusion. He will have a clinical evaluation in 8 weeks.  Ra Pfiester E, PA-C  10/17/201511:15 PM

## 2014-09-04 NOTE — Patient Instructions (Signed)
Continue labs and Aranesp injections as scheduled and packed red blood cell transfusions as needed Followup in approximately 8 weeks

## 2014-09-20 ENCOUNTER — Other Ambulatory Visit: Payer: Self-pay | Admitting: Oncology

## 2014-09-20 ENCOUNTER — Ambulatory Visit (HOSPITAL_COMMUNITY)
Admission: RE | Admit: 2014-09-20 | Discharge: 2014-09-20 | Disposition: A | Discharge status: Home / Self Care | Payer: Medicare Other | Source: Ambulatory Visit | Attending: Oncology | Admitting: Oncology

## 2014-09-20 ENCOUNTER — Ambulatory Visit: Payer: Medicare Other

## 2014-09-20 ENCOUNTER — Telehealth: Payer: Self-pay | Admitting: Oncology

## 2014-09-20 ENCOUNTER — Other Ambulatory Visit (HOSPITAL_BASED_OUTPATIENT_CLINIC_OR_DEPARTMENT_OTHER): Payer: Medicare Other

## 2014-09-20 ENCOUNTER — Ambulatory Visit (HOSPITAL_BASED_OUTPATIENT_CLINIC_OR_DEPARTMENT_OTHER): Payer: Medicare Other

## 2014-09-20 VITALS — BP 114/49 | HR 58 | Temp 98.2°F | Resp 18

## 2014-09-20 DIAGNOSIS — D469 Myelodysplastic syndrome, unspecified: Secondary | ICD-10-CM | POA: Insufficient documentation

## 2014-09-20 DIAGNOSIS — D531 Other megaloblastic anemias, not elsewhere classified: Secondary | ICD-10-CM

## 2014-09-20 DIAGNOSIS — D630 Anemia in neoplastic disease: Secondary | ICD-10-CM

## 2014-09-20 DIAGNOSIS — D471 Chronic myeloproliferative disease: Secondary | ICD-10-CM

## 2014-09-20 DIAGNOSIS — D649 Anemia, unspecified: Secondary | ICD-10-CM

## 2014-09-20 LAB — CBC WITH DIFFERENTIAL/PLATELET
BASO%: 1.5 % (ref 0.0–2.0)
Basophils Absolute: 0 10*3/uL (ref 0.0–0.1)
EOS%: 1 % (ref 0.0–7.0)
Eosinophils Absolute: 0 10*3/uL (ref 0.0–0.5)
HCT: 21.2 % — ABNORMAL LOW (ref 38.4–49.9)
HGB: 6.8 g/dL — CL (ref 13.0–17.1)
LYMPH#: 0.6 10*3/uL — AB (ref 0.9–3.3)
LYMPH%: 28.6 % (ref 14.0–49.0)
MCH: 28.5 pg (ref 27.2–33.4)
MCHC: 32.1 g/dL (ref 32.0–36.0)
MCV: 88.7 fL (ref 79.3–98.0)
MONO#: 0.1 10*3/uL (ref 0.1–0.9)
MONO%: 7 % (ref 0.0–14.0)
NEUT#: 1.2 10*3/uL — ABNORMAL LOW (ref 1.5–6.5)
NEUT%: 61.9 % (ref 39.0–75.0)
Platelets: 104 10*3/uL — ABNORMAL LOW (ref 140–400)
RBC: 2.39 10*6/uL — AB (ref 4.20–5.82)
RDW: 20.5 % — ABNORMAL HIGH (ref 11.0–14.6)
WBC: 2 10*3/uL — ABNORMAL LOW (ref 4.0–10.3)
nRBC: 2 % — ABNORMAL HIGH (ref 0–0)

## 2014-09-20 LAB — TECHNOLOGIST REVIEW

## 2014-09-20 LAB — HOLD TUBE, BLOOD BANK

## 2014-09-20 LAB — PREPARE RBC (CROSSMATCH)

## 2014-09-20 MED ORDER — ACETAMINOPHEN 325 MG PO TABS
650.0000 mg | ORAL_TABLET | Freq: Once | ORAL | Status: AC
  Administered 2014-09-20: 650 mg via ORAL

## 2014-09-20 MED ORDER — SODIUM CHLORIDE 0.9 % IV SOLN
250.0000 mL | Freq: Once | INTRAVENOUS | Status: AC
  Administered 2014-09-20: 250 mL via INTRAVENOUS

## 2014-09-20 MED ORDER — DIPHENHYDRAMINE HCL 25 MG PO CAPS
25.0000 mg | ORAL_CAPSULE | Freq: Once | ORAL | Status: AC
  Administered 2014-09-20: 25 mg via ORAL

## 2014-09-20 MED ORDER — ACETAMINOPHEN 325 MG PO TABS
ORAL_TABLET | ORAL | Status: AC
  Filled 2014-09-20: qty 2

## 2014-09-20 MED ORDER — DIPHENHYDRAMINE HCL 25 MG PO CAPS
ORAL_CAPSULE | ORAL | Status: AC
  Filled 2014-09-20: qty 1

## 2014-09-20 NOTE — Patient Instructions (Signed)

## 2014-09-20 NOTE — Telephone Encounter (Signed)
pt came in to add inj appt...pt ok and aware

## 2014-09-21 ENCOUNTER — Other Ambulatory Visit: Payer: Self-pay | Admitting: *Deleted

## 2014-09-21 ENCOUNTER — Ambulatory Visit (HOSPITAL_BASED_OUTPATIENT_CLINIC_OR_DEPARTMENT_OTHER): Payer: Medicare Other

## 2014-09-21 VITALS — BP 114/37 | HR 63 | Temp 99.2°F | Resp 18

## 2014-09-21 DIAGNOSIS — D469 Myelodysplastic syndrome, unspecified: Secondary | ICD-10-CM | POA: Diagnosis not present

## 2014-09-21 DIAGNOSIS — D630 Anemia in neoplastic disease: Secondary | ICD-10-CM

## 2014-09-21 DIAGNOSIS — D649 Anemia, unspecified: Secondary | ICD-10-CM

## 2014-09-21 DIAGNOSIS — D471 Chronic myeloproliferative disease: Secondary | ICD-10-CM

## 2014-09-21 MED ORDER — SODIUM CHLORIDE 0.9 % IV SOLN
250.0000 mL | Freq: Once | INTRAVENOUS | Status: AC
  Administered 2014-09-21: 250 mL via INTRAVENOUS

## 2014-09-21 MED ORDER — DIPHENHYDRAMINE HCL 25 MG PO CAPS
25.0000 mg | ORAL_CAPSULE | Freq: Once | ORAL | Status: AC
  Administered 2014-09-21: 25 mg via ORAL

## 2014-09-21 MED ORDER — ACETAMINOPHEN 325 MG PO TABS
650.0000 mg | ORAL_TABLET | Freq: Once | ORAL | Status: AC
  Administered 2014-09-21: 650 mg via ORAL

## 2014-09-21 MED ORDER — DIPHENHYDRAMINE HCL 25 MG PO CAPS
ORAL_CAPSULE | ORAL | Status: AC
  Filled 2014-09-21: qty 1

## 2014-09-21 MED ORDER — ACETAMINOPHEN 325 MG PO TABS
ORAL_TABLET | ORAL | Status: AC
  Filled 2014-09-21: qty 2

## 2014-09-21 NOTE — Patient Instructions (Signed)

## 2014-09-22 LAB — TYPE AND SCREEN
ABO/RH(D): O POS
ANTIBODY SCREEN: NEGATIVE
Unit division: 0
Unit division: 0

## 2014-10-04 ENCOUNTER — Other Ambulatory Visit: Payer: Self-pay | Admitting: *Deleted

## 2014-10-04 ENCOUNTER — Other Ambulatory Visit: Payer: Self-pay | Admitting: Medical Oncology

## 2014-10-04 ENCOUNTER — Ambulatory Visit (HOSPITAL_BASED_OUTPATIENT_CLINIC_OR_DEPARTMENT_OTHER): Payer: Medicare Other

## 2014-10-04 ENCOUNTER — Other Ambulatory Visit (HOSPITAL_BASED_OUTPATIENT_CLINIC_OR_DEPARTMENT_OTHER): Payer: Medicare Other

## 2014-10-04 ENCOUNTER — Encounter: Payer: Self-pay | Admitting: Oncology

## 2014-10-04 VITALS — BP 115/56 | HR 67 | Temp 98.3°F | Resp 16

## 2014-10-04 DIAGNOSIS — D531 Other megaloblastic anemias, not elsewhere classified: Secondary | ICD-10-CM

## 2014-10-04 DIAGNOSIS — D630 Anemia in neoplastic disease: Secondary | ICD-10-CM

## 2014-10-04 DIAGNOSIS — D649 Anemia, unspecified: Secondary | ICD-10-CM

## 2014-10-04 DIAGNOSIS — C946 Myelodysplastic disease, not classified: Secondary | ICD-10-CM

## 2014-10-04 DIAGNOSIS — D471 Chronic myeloproliferative disease: Secondary | ICD-10-CM

## 2014-10-04 DIAGNOSIS — D469 Myelodysplastic syndrome, unspecified: Secondary | ICD-10-CM | POA: Diagnosis not present

## 2014-10-04 LAB — CBC WITH DIFFERENTIAL/PLATELET
BASO%: 1.7 % (ref 0.0–2.0)
BASOS ABS: 0 10*3/uL (ref 0.0–0.1)
EOS%: 1.7 % (ref 0.0–7.0)
Eosinophils Absolute: 0 10*3/uL (ref 0.0–0.5)
HCT: 22 % — ABNORMAL LOW (ref 38.4–49.9)
HEMOGLOBIN: 7 g/dL — AB (ref 13.0–17.1)
LYMPH#: 0.5 10*3/uL — AB (ref 0.9–3.3)
LYMPH%: 28.7 % (ref 14.0–49.0)
MCH: 28.5 pg (ref 27.2–33.4)
MCHC: 31.8 g/dL — ABNORMAL LOW (ref 32.0–36.0)
MCV: 89.4 fL (ref 79.3–98.0)
MONO#: 0.2 10*3/uL (ref 0.1–0.9)
MONO%: 8.6 % (ref 0.0–14.0)
NEUT#: 1 10*3/uL — ABNORMAL LOW (ref 1.5–6.5)
NEUT%: 59.3 % (ref 39.0–75.0)
Platelets: 95 10*3/uL — ABNORMAL LOW (ref 140–400)
RBC: 2.46 10*6/uL — AB (ref 4.20–5.82)
RDW: 19.7 % — ABNORMAL HIGH (ref 11.0–14.6)
WBC: 1.7 10*3/uL — AB (ref 4.0–10.3)
nRBC: 3 % — ABNORMAL HIGH (ref 0–0)

## 2014-10-04 LAB — PREPARE RBC (CROSSMATCH)

## 2014-10-04 LAB — TECHNOLOGIST REVIEW

## 2014-10-04 LAB — HOLD TUBE, BLOOD BANK

## 2014-10-04 MED ORDER — DIPHENHYDRAMINE HCL 25 MG PO CAPS
25.0000 mg | ORAL_CAPSULE | Freq: Once | ORAL | Status: AC
  Administered 2014-10-04: 25 mg via ORAL

## 2014-10-04 MED ORDER — DIPHENHYDRAMINE HCL 25 MG PO CAPS
ORAL_CAPSULE | ORAL | Status: AC
  Filled 2014-10-04: qty 1

## 2014-10-04 MED ORDER — SODIUM CHLORIDE 0.9 % IV SOLN
250.0000 mL | Freq: Once | INTRAVENOUS | Status: AC
  Administered 2014-10-04: 250 mL via INTRAVENOUS

## 2014-10-04 MED ORDER — ACETAMINOPHEN 325 MG PO TABS
ORAL_TABLET | ORAL | Status: AC
  Filled 2014-10-04: qty 2

## 2014-10-04 MED ORDER — ACETAMINOPHEN 325 MG PO TABS
650.0000 mg | ORAL_TABLET | Freq: Once | ORAL | Status: AC
  Administered 2014-10-04: 650 mg via ORAL

## 2014-10-04 MED ORDER — DARBEPOETIN ALFA 300 MCG/0.6ML IJ SOSY
300.0000 ug | PREFILLED_SYRINGE | Freq: Once | INTRAMUSCULAR | Status: AC
  Administered 2014-10-04: 300 ug via SUBCUTANEOUS
  Filled 2014-10-04: qty 0.6

## 2014-10-04 NOTE — Progress Notes (Signed)
V.[facility]. R & A, per MD, patient to receive 1 unit of PRBC today @ 1pm. Patient knows to return for 1 pm appt. Orders placed. Appt made.

## 2014-10-04 NOTE — Progress Notes (Signed)
Pt is approved for the $400 [place].  Pt has copy of approval letter.

## 2014-10-04 NOTE — Patient Instructions (Signed)
Darbepoetin Alfa injection What is this medicine? DARBEPOETIN ALFA (dar be POE e tin AL fa) helps your body make more red blood cells. It is used to treat anemia caused by chronic kidney failure and chemotherapy. This medicine may be used for other purposes; ask your health care provider or pharmacist if you have questions. COMMON BRAND NAME(S): Aranesp What should I tell my health care provider before I take this medicine? They need to know if you have any of these conditions: -blood clotting disorders or history of blood clots -cancer patient not on chemotherapy -cystic fibrosis -heart disease, such as angina, heart failure, or a history of a heart attack -hemoglobin level of 12 g/dL or greater -high blood pressure -low levels of folate, iron, or vitamin B12 -seizures -an unusual or allergic reaction to darbepoetin, erythropoietin, albumin, hamster proteins, latex, other medicines, foods, dyes, or preservatives -pregnant or trying to get pregnant -breast-feeding How should I use this medicine? This medicine is for injection into a vein or under the skin. It is usually given by a health care professional in a hospital or clinic setting. If you get this medicine at home, you will be taught how to prepare and give this medicine. Do not shake the solution before you withdraw a dose. Use exactly as directed. Take your medicine at regular intervals. Do not take your medicine more often than directed. It is important that you put your used needles and syringes in a special sharps container. Do not put them in a trash can. If you do not have a sharps container, call your pharmacist or healthcare provider to get one. Talk to your pediatrician regarding the use of this medicine in children. While this medicine may be used in children as young as 1 year for selected conditions, precautions do apply. Overdosage: If you think you have taken too much of this medicine contact a poison control center or  emergency room at once. NOTE: This medicine is only for you. Do not share this medicine with others. What if I miss a dose? If you miss a dose, take it as soon as you can. If it is almost time for your next dose, take only that dose. Do not take double or extra doses. What may interact with this medicine? Do not take this medicine with any of the following medications: -epoetin alfa This list may not describe all possible interactions. Give your health care provider a list of all the medicines, herbs, non-prescription drugs, or dietary supplements you use. Also tell them if you smoke, drink alcohol, or use illegal drugs. Some items may interact with your medicine. What should I watch for while using this medicine? Visit your prescriber or health care professional for regular checks on your progress and for the needed blood tests and blood pressure measurements. It is especially important for the doctor to make sure your hemoglobin level is in the desired range, to limit the risk of potential side effects and to give you the best benefit. Keep all appointments for any recommended tests. Check your blood pressure as directed. Ask your doctor what your blood pressure should be and when you should contact him or her. As your body makes more red blood cells, you may need to take iron, folic acid, or vitamin B supplements. Ask your doctor or health care provider which products are right for you. If you have kidney disease continue dietary restrictions, even though this medication can make you feel better. Talk with your doctor or health   care professional about the foods you eat and the vitamins that you take. What side effects may I notice from receiving this medicine? Side effects that you should report to your doctor or health care professional as soon as possible: -allergic reactions like skin rash, itching or hives, swelling of the face, lips, or tongue -breathing problems -changes in vision -chest  pain -confusion, trouble speaking or understanding -feeling faint or lightheaded, falls -high blood pressure -muscle aches or pains -pain, swelling, warmth in the leg -rapid weight gain -severe headaches -sudden numbness or weakness of the face, arm or leg -trouble walking, dizziness, loss of balance or coordination -seizures (convulsions) -swelling of the ankles, feet, hands -unusually weak or tired Side effects that usually do not require medical attention (report to your doctor or health care professional if they continue or are bothersome): -diarrhea -fever, chills (flu-like symptoms) -headaches -nausea, vomiting -redness, stinging, or swelling at site where injected This list may not describe all possible side effects. Call your doctor for medical advice about side effects. You may report side effects to FDA at 1-800-FDA-1088. Where should I keep my medicine? Keep out of the reach of children. Store in a refrigerator between 2 and 8 degrees C (36 and 46 degrees F). Do not freeze. Do not shake. Throw away any unused portion if using a single-dose vial. Throw away any unused medicine after the expiration date. NOTE: This sheet is a summary. It may not cover all possible information. If you have questions about this medicine, talk to your doctor, pharmacist, or health care provider.  2015, [employer]. (2008-10-19 10:23:57)  

## 2014-10-04 NOTE — Patient Instructions (Signed)

## 2014-10-05 ENCOUNTER — Encounter: Payer: Self-pay | Admitting: Oncology

## 2014-10-05 LAB — TYPE AND SCREEN
ABO/RH(D): O POS
Antibody Screen: NEGATIVE
Unit division: 0

## 2014-10-05 NOTE — Progress Notes (Signed)
Pt is approved thru [employer] for Aranesp from 10/04/14 to 10/04/15 or when the benefit cap has been met.  Expenses can be submitted for reimbursement for dos 07/06/14 to 10/04/15.  The amount of the grant is $10,000.  I will send copy of letter to Ashlei with the billing dept.  °

## 2014-10-18 ENCOUNTER — Ambulatory Visit (HOSPITAL_BASED_OUTPATIENT_CLINIC_OR_DEPARTMENT_OTHER): Payer: Medicare Other | Admitting: Nurse Practitioner

## 2014-10-18 ENCOUNTER — Other Ambulatory Visit: Payer: Self-pay | Admitting: Oncology

## 2014-10-18 ENCOUNTER — Other Ambulatory Visit: Payer: Self-pay | Admitting: *Deleted

## 2014-10-18 ENCOUNTER — Ambulatory Visit (HOSPITAL_BASED_OUTPATIENT_CLINIC_OR_DEPARTMENT_OTHER): Payer: Medicare Other

## 2014-10-18 ENCOUNTER — Other Ambulatory Visit (HOSPITAL_BASED_OUTPATIENT_CLINIC_OR_DEPARTMENT_OTHER): Payer: Medicare Other

## 2014-10-18 ENCOUNTER — Non-Acute Institutional Stay (HOSPITAL_COMMUNITY)
Admission: AD | Admit: 2014-10-18 | Discharge: 2014-10-18 | Disposition: A | Discharge status: Home / Self Care | Payer: Medicare Other | Source: Ambulatory Visit | Attending: Oncology | Admitting: Oncology

## 2014-10-18 ENCOUNTER — Encounter: Payer: Self-pay | Admitting: Nurse Practitioner

## 2014-10-18 VITALS — Ht 69.0 in | Wt 169.8 lb

## 2014-10-18 DIAGNOSIS — D531 Other megaloblastic anemias, not elsewhere classified: Secondary | ICD-10-CM

## 2014-10-18 DIAGNOSIS — D47Z9 Other specified neoplasms of uncertain behavior of lymphoid, hematopoietic and related tissue: Secondary | ICD-10-CM

## 2014-10-18 DIAGNOSIS — D61818 Other pancytopenia: Secondary | ICD-10-CM

## 2014-10-18 DIAGNOSIS — D649 Anemia, unspecified: Secondary | ICD-10-CM | POA: Insufficient documentation

## 2014-10-18 DIAGNOSIS — D630 Anemia in neoplastic disease: Secondary | ICD-10-CM

## 2014-10-18 DIAGNOSIS — D471 Chronic myeloproliferative disease: Secondary | ICD-10-CM

## 2014-10-18 DIAGNOSIS — D709 Neutropenia, unspecified: Secondary | ICD-10-CM

## 2014-10-18 DIAGNOSIS — R6884 Jaw pain: Secondary | ICD-10-CM

## 2014-10-18 LAB — PREPARE RBC (CROSSMATCH)

## 2014-10-18 LAB — CBC WITH DIFFERENTIAL/PLATELET
BASO%: 1.5 % (ref 0.0–2.0)
Basophils Absolute: 0 10*3/uL (ref 0.0–0.1)
EOS%: 3.1 % (ref 0.0–7.0)
Eosinophils Absolute: 0.1 10*3/uL (ref 0.0–0.5)
HEMATOCRIT: 19.3 % — AB (ref 38.4–49.9)
HGB: 6.1 g/dL — CL (ref 13.0–17.1)
LYMPH%: 34 % (ref 14.0–49.0)
MCH: 28.2 pg (ref 27.2–33.4)
MCHC: 31.6 g/dL — ABNORMAL LOW (ref 32.0–36.0)
MCV: 89.4 fL (ref 79.3–98.0)
MONO#: 0.1 10*3/uL (ref 0.1–0.9)
MONO%: 7.2 % (ref 0.0–14.0)
NEUT#: 1.1 10*3/uL — ABNORMAL LOW (ref 1.5–6.5)
NEUT%: 54.2 % (ref 39.0–75.0)
Platelets: 140 10*3/uL (ref 140–400)
RBC: 2.16 10*6/uL — ABNORMAL LOW (ref 4.20–5.82)
RDW: 21.1 % — ABNORMAL HIGH (ref 11.0–14.6)
WBC: 1.9 10*3/uL — ABNORMAL LOW (ref 4.0–10.3)
lymph#: 0.7 10*3/uL — ABNORMAL LOW (ref 0.9–3.3)
nRBC: 4 % — ABNORMAL HIGH (ref 0–0)

## 2014-10-18 LAB — TECHNOLOGIST REVIEW

## 2014-10-18 LAB — HOLD TUBE, BLOOD BANK

## 2014-10-18 MED ORDER — ACETAMINOPHEN 325 MG PO TABS
650.0000 mg | ORAL_TABLET | Freq: Once | ORAL | Status: AC
  Administered 2014-10-18: 650 mg via ORAL
  Filled 2014-10-18: qty 2

## 2014-10-18 MED ORDER — SODIUM CHLORIDE 0.9 % IJ SOLN: 10.0000 mL | INTRAMUSCULAR | Status: DC | PRN

## 2014-10-18 MED ORDER — DIPHENHYDRAMINE HCL 25 MG PO CAPS
25.0000 mg | ORAL_CAPSULE | Freq: Once | ORAL | Status: AC
  Administered 2014-10-18: 25 mg via ORAL
  Filled 2014-10-18: qty 1

## 2014-10-18 MED ORDER — SODIUM CHLORIDE 0.9 % IV SOLN
250.0000 mL | Freq: Once | INTRAVENOUS | Status: AC
  Administered 2014-10-18: 250 mL via INTRAVENOUS

## 2014-10-18 MED ORDER — HEPARIN SOD (PORK) LOCK FLUSH 100 UNIT/ML IV SOLN: 500.0000 [IU] | Freq: Every day | INTRAVENOUS | Status: DC | PRN

## 2014-10-18 MED ORDER — HYDROCODONE-ACETAMINOPHEN 5-325 MG PO TABS: 1.0000 | ORAL_TABLET | Freq: Four times a day (QID) | ORAL | Status: DC | PRN

## 2014-10-18 MED ORDER — DARBEPOETIN ALFA 300 MCG/0.6ML IJ SOSY
300.0000 ug | PREFILLED_SYRINGE | Freq: Once | INTRAMUSCULAR | Status: AC
  Administered 2014-10-18: 300 ug via SUBCUTANEOUS
  Filled 2014-10-18: qty 0.6

## 2014-10-18 NOTE — Progress Notes (Signed)
will   SYMPTOM MANAGEMENT CL[facility]IC   HPI: Kyle Heath 80 y.o. male diagnosed with myelofibrosis with secondary pancytopenia.  Currently undergoing Aranesp injections and he is fairly transfusion dependent.  Patient presented to the [facility] today for labs and his prescheduled Aranesp injection.  Hemoglobin today was 6.1.  Patient will receive 2 units of packed red blood cells today.  Patient was also neutropenic with an ANC of 1.1.  The free fluid reviewed all neutropenia guidelines with both patient and his daughter in today.  Patient denies any recent fevers or chills.  Patient is complaining of some right jaw pain since yesterday.  He states that the pain increases when he opens his mouth wide.  Patient is edentulous; and is only wearing his upper dentures.  He denies any oral lesions whatsoever.  He denies any specific ear pain or hearing loss.  He denies any other URI symptoms whatsoever.    HPI  ROS  Past Medical History  Diagnosis Date  . Diabetes mellitus   . Anemia   . Hypothyroidism   . Hypertension   . Myeloproliferative disorder 06/30/2012  . CVA (cerebral infarction)   . Atrial flutter   . CHF (congestive heart failure)   . Gout   . BPH (benign prostatic hypertrophy)     Past Surgical History  Procedure Laterality Date  . Appendectomy    . Thyroid surgery    . Cyst removal neck  2009    has Anemia; Myeloproliferative disorder; Shortness of breath; Atrial flutter; Volume overload; Subacute delirium; Hypothyroidism; Pain; Fatigue; Weakness generalized; Acute on chronic diastolic heart failure; Acute respiratory failure with hypoxia; Essential hypertension, benign; Gout; CVA (cerebral infarction); and Jaw pain on his problem list.     has No Known Allergies.    Medication List       This list is accurate as of: 10/18/14 10:55 AM.  Always use your most recent med list.               aspirin 81 MG tablet  Take 81 mg by mouth daily after breakfast.     carvedilol 25 MG tablet  Commonly known as:  COREG  Take 1 tablet (25 mg total) by mouth 2 (two) times daily with a meal.     diltiazem 360 MG 24 hr capsule  Commonly known as:  CARDIZEM CD  Take 1 capsule (360 mg total) by mouth daily.     furosemide 40 MG tablet  Commonly known as:  LASIX  Take 1 tablet (40 mg total) by mouth daily.     HYDROcodone-acetaminophen 5-325 MG per tablet  Commonly known as:  NORCO/VICODIN  Take 1-2 tablets by mouth every 6 (six) hours as needed for moderate pain.     levothyroxine 88 MCG tablet  Commonly known as:  SYNTHROID, LEVOTHROID  Take 88 mcg by mouth daily before breakfast.     losartan 100 MG tablet  Commonly known as:  COZAAR  Take 50 mg by mouth daily.     multivitamin with minerals Tabs tablet  Take 1 tablet by mouth daily.     potassium chloride SA 20 MEQ tablet  Commonly known as:  K-DUR,KLOR-CON  Take 1 tablet (20 mEq total) by mouth daily.     TAZTIA XT 360 MG 24 hr capsule  Generic drug:  diltiazem  Take 360 mg by mouth daily.     ULORIC 40 MG tablet  Generic drug:  febuxostat  Take 80 mg by mouth daily.  PHYSICAL EXAMINATION  Height 5\' 9"  (1.753 m), weight 169 lb 12.8 oz (77.021 kg).  Physical Exam  Constitutional: He is oriented to person, place, and time. Vital signs are normal. He appears unhealthy.  HENT:  Head: Normocephalic and atraumatic.  Right Ear: External ear normal.  Left Ear: External ear normal.  Nose: Nose normal.  Mouth/Throat: Oropharynx is clear and moist.  Patient is a dentulous; but is wearing upper dentures only.  There are no oral lesions on exam.  There is no abscess noted in the mouth.  Patient does have full range of motion with his jaws.  He does however, have some tenderness to the right TMJ area when he opens his mouth wide.  Bilateral TMs intact with no effusions.  Oropharynx clear.  Eyes: Conjunctivae and EOM are normal. Pupils are equal, round, and reactive to light. Right eye  exhibits no discharge. Left eye exhibits no discharge. No scleral icterus.  Neck: Normal range of motion. Neck supple. No JVD present. No tracheal deviation present. No thyromegaly present.  Cardiovascular: Normal rate, regular rhythm, normal heart sounds and intact distal pulses.  Exam reveals no friction rub.   No murmur heard. Pulmonary/Chest: Effort normal and breath sounds normal. No respiratory distress. He has no wheezes. He has no rales. He exhibits no tenderness.  Abdominal: Soft. Bowel sounds are normal. He exhibits no distension and no mass. There is no tenderness. There is no rebound and no guarding.  Musculoskeletal: Normal range of motion. He exhibits no edema or tenderness.  Lymphadenopathy:    He has no cervical adenopathy.  Neurological: He is alert and oriented to person, place, and time. Gait normal.  Skin: Skin is warm and dry. No rash noted. No erythema.  Psychiatric: Affect normal.  Nursing note and vitals reviewed.   LABORATORY DATA:. Appointment on 10/18/2014  Component Date Value Ref Range Status  . WBC 10/18/2014 1.9* 4.0 - 10.3 10e3/uL Final  . NEUT# 10/18/2014 1.1* 1.5 - 6.5 10e3/uL Final  . HGB 10/18/2014 6.1* 13.0 - 17.1 g/dL Final  . HCT 10/18/2014 19.3* 38.4 - 49.9 % Final  . Platelets 10/18/2014 140  140 - 400 10e3/uL Final  . MCV 10/18/2014 89.4  79.3 - 98.0 fL Final  . MCH 10/18/2014 28.2  27.2 - 33.4 pg Final  . MCHC 10/18/2014 31.6* 32.0 - 36.0 g/dL Final  . RBC 10/18/2014 2.16* 4.20 - 5.82 10e6/uL Final  . RDW 10/18/2014 21.1* 11.0 - 14.6 % Final  . lymph# 10/18/2014 0.7* 0.9 - 3.3 10e3/uL Final  . MONO# 10/18/2014 0.1  0.1 - 0.9 10e3/uL Final  . Eosinophils Absolute 10/18/2014 0.1  0.0 - 0.5 10e3/uL Final  . Basophils Absolute 10/18/2014 0.0  0.0 - 0.1 10e3/uL Final  . NEUT% 10/18/2014 54.2  39.0 - 75.0 % Final  . LYMPH% 10/18/2014 34.0  14.0 - 49.0 % Final  . MONO% 10/18/2014 7.2  0.0 - 14.0 % Final  . EOS% 10/18/2014 3.1  0.0 - 7.0 % Final    . BASO% 10/18/2014 1.5  0.0 - 2.0 % Final  . nRBC 10/18/2014 4* 0 - 0 % Final  . Technologist Review 10/18/2014 Occ Metas and Myelocytes present, 4% blasts   Final     RADIOGRAPHIC STUDIES: No results found.  ASSESSMENT/PLAN:    Jaw pain Patient is complaining of new onset right jaw pain for the last 24 hours.  He states he pain only occurs when he opens his mouth wide.  He denies any specific  ear pain, or loss of hearing.  He denies any sore throat or recent fevers or chills.  Patient does have some upper dentures intact; but there is no evidence of oral lesions or abscess.  Patient was advised to rinse his mouth with warm salt water for comfort.  He was also given a prescription for hydrocodone to take on appearing basis.  Advised both patient and his daughter that he may very well need a followup with his dentist/denture provider for further evaluation.  Also, advised patient and his daughter to call or return to the [facility] if his symptoms persist or worsen whatsoever.  Myeloproliferative disorder Patient has been diagnosed with myelofibrosis with secondary pancytopenia.  Patient remains neutropenic with an ANC of 1.1.  Briefly reviewed all neutropenia guidelines again with both patient and his daughter.  Account is stable at 140.  Hemoglobin has now decreased to 6.1.  Patient will receive 2 units packed red blood cells while in the sickle cell unit today.  Also, patient will receive Aranesp 300 mcg injection.  Patient has plans to return on 11/02/2014 for labs, follow up visit, and his next Aranesp injection.  We will also hold an appointment slot for a possible blood transfusion at that time as well.  Both patient and his daughter know to call in 20 have any new worries or concerns.  Patient stated understanding of all instructions; and was in agreement with this plan of care. The patient knows to call the clinic with any problems, questions or concerns.   Review/collaboration with  Dr. [name] regarding all aspects of patient's visit today.   Total time spent with patient was 25 minutes;  with greater than 80 percent of that time spent in face to face counseling regarding his symptoms, and coordination of care and follow up.  Disclaimer: This note was dictated with voice recognition software. Similar sounding words can inadvertently be transcribed and may not be corrected upon review.   Drue Second, NP 10/18/2014

## 2014-10-18 NOTE — Procedures (Signed)
[facility]  Procedure Note  Tywan Siever UXY:[id number] DOB: [birth year] DOA: 10/18/2014   PCP: Foye Spurling, MD   Associated Diagnosis: Anemia, unspecified anemia  Procedure Note: Transfusion of 2 units PRBCs   Condition During Procedure: Pt tolerated well   Condition at Discharge:  No complications noted; pt alert, ambulatory   Tamala Julian, Salley Slaughter, [facility]

## 2014-10-18 NOTE — Assessment & Plan Note (Signed)
Patient has been diagnosed with myelofibrosis with secondary pancytopenia.  Patient remains neutropenic with an ANC of 1.1.  Briefly reviewed all neutropenia guidelines again with both patient and his daughter.  Account is stable at 140.  Hemoglobin has now decreased to 6.1.  Patient will receive 2 units packed red blood cells while in the sickle cell unit today.  Also, patient will receive Aranesp 300 mcg injection.  Patient has plans to return on 11/02/2014 for labs, follow up visit, and his next Aranesp injection.  We will also hold an appointment slot for a possible blood transfusion at that time as well.  Both patient and his daughter know to call in 20 have any new worries or concerns.

## 2014-10-18 NOTE — Assessment & Plan Note (Signed)
Patient is complaining of new onset right jaw pain for the last 24 hours.  He states he pain only occurs when he opens his mouth wide.  He denies any specific ear pain, or loss of hearing.  He denies any sore throat or recent fevers or chills.  Patient does have some upper dentures intact; but there is no evidence of oral lesions or abscess.  Patient was advised to rinse his mouth with warm salt water for comfort.  He was also given a prescription for hydrocodone to take on appearing basis.  Advised both patient and his daughter that he may very well need a followup with his dentist/denture provider for further evaluation.  Also, advised patient and his daughter to call or return to the [facility] if his symptoms persist or worsen whatsoever.

## 2014-10-19 ENCOUNTER — Ambulatory Visit (HOSPITAL_COMMUNITY)
Admission: RE | Admit: 2014-10-19 | Discharge: 2014-10-19 | Disposition: A | Discharge status: Home / Self Care | Payer: Medicare Other | Source: Ambulatory Visit | Attending: Oncology | Admitting: Oncology

## 2014-10-19 DIAGNOSIS — D508 Other iron deficiency anemias: Secondary | ICD-10-CM | POA: Insufficient documentation

## 2014-10-19 DIAGNOSIS — D471 Chronic myeloproliferative disease: Secondary | ICD-10-CM | POA: Insufficient documentation

## 2014-10-19 LAB — TYPE AND SCREEN
ABO/RH(D): O POS
Antibody Screen: NEGATIVE
UNIT DIVISION: 0
Unit division: 0

## 2014-11-02 ENCOUNTER — Other Ambulatory Visit (HOSPITAL_BASED_OUTPATIENT_CLINIC_OR_DEPARTMENT_OTHER): Payer: Medicare Other

## 2014-11-02 ENCOUNTER — Ambulatory Visit (HOSPITAL_BASED_OUTPATIENT_CLINIC_OR_DEPARTMENT_OTHER): Payer: Medicare Other

## 2014-11-02 ENCOUNTER — Ambulatory Visit (HOSPITAL_BASED_OUTPATIENT_CLINIC_OR_DEPARTMENT_OTHER): Payer: Medicare Other | Admitting: Oncology

## 2014-11-02 VITALS — BP 127/39 | HR 72 | Temp 98.1°F | Resp 18 | Ht 69.0 in | Wt 165.6 lb

## 2014-11-02 VITALS — BP 130/41 | HR 59 | Temp 97.7°F | Resp 20

## 2014-11-02 DIAGNOSIS — D649 Anemia, unspecified: Secondary | ICD-10-CM

## 2014-11-02 DIAGNOSIS — D479 Neoplasm of uncertain behavior of lymphoid, hematopoietic and related tissue, unspecified: Secondary | ICD-10-CM

## 2014-11-02 DIAGNOSIS — D696 Thrombocytopenia, unspecified: Secondary | ICD-10-CM

## 2014-11-02 DIAGNOSIS — D469 Myelodysplastic syndrome, unspecified: Secondary | ICD-10-CM

## 2014-11-02 DIAGNOSIS — D471 Chronic myeloproliferative disease: Secondary | ICD-10-CM

## 2014-11-02 DIAGNOSIS — D508 Other iron deficiency anemias: Secondary | ICD-10-CM

## 2014-11-02 DIAGNOSIS — D531 Other megaloblastic anemias, not elsewhere classified: Secondary | ICD-10-CM

## 2014-11-02 DIAGNOSIS — D709 Neutropenia, unspecified: Secondary | ICD-10-CM

## 2014-11-02 LAB — CBC WITH DIFFERENTIAL/PLATELET
BASO%: 2 % (ref 0.0–2.0)
Basophils Absolute: 0 10*3/uL (ref 0.0–0.1)
EOS ABS: 0 10*3/uL (ref 0.0–0.5)
EOS%: 2 % (ref 0.0–7.0)
HCT: 22.3 % — ABNORMAL LOW (ref 38.4–49.9)
HGB: 7 g/dL — ABNORMAL LOW (ref 13.0–17.1)
LYMPH%: 30.3 % (ref 14.0–49.0)
MCH: 28.9 pg (ref 27.2–33.4)
MCHC: 31.4 g/dL — AB (ref 32.0–36.0)
MCV: 92.1 fL (ref 79.3–98.0)
MONO#: 0.1 10*3/uL (ref 0.1–0.9)
MONO%: 6.5 % (ref 0.0–14.0)
NEUT#: 1.2 10*3/uL — ABNORMAL LOW (ref 1.5–6.5)
NEUT%: 59.2 % (ref 39.0–75.0)
NRBC: 3 % — AB (ref 0–0)
PLATELETS: 97 10*3/uL — AB (ref 140–400)
RBC: 2.42 10*6/uL — AB (ref 4.20–5.82)
RDW: 20.6 % — ABNORMAL HIGH (ref 11.0–14.6)
WBC: 2 10*3/uL — ABNORMAL LOW (ref 4.0–10.3)
lymph#: 0.6 10*3/uL — ABNORMAL LOW (ref 0.9–3.3)

## 2014-11-02 LAB — TECHNOLOGIST REVIEW

## 2014-11-02 LAB — HOLD TUBE, BLOOD BANK

## 2014-11-02 LAB — PREPARE RBC (CROSSMATCH)

## 2014-11-02 MED ORDER — DIPHENHYDRAMINE HCL 25 MG PO CAPS
25.0000 mg | ORAL_CAPSULE | Freq: Once | ORAL | Status: AC
  Administered 2014-11-02: 25 mg via ORAL

## 2014-11-02 MED ORDER — SODIUM CHLORIDE 0.9 % IV SOLN
250.0000 mL | Freq: Once | INTRAVENOUS | Status: AC
  Administered 2014-11-02: 250 mL via INTRAVENOUS

## 2014-11-02 MED ORDER — ACETAMINOPHEN 325 MG PO TABS
650.0000 mg | ORAL_TABLET | Freq: Once | ORAL | Status: AC
  Administered 2014-11-02: 650 mg via ORAL

## 2014-11-02 MED ORDER — SODIUM CHLORIDE 0.9 % IJ SOLN
10.0000 mL | INTRAMUSCULAR | Status: DC | PRN
  Filled 2014-11-02: qty 10

## 2014-11-02 MED ORDER — DARBEPOETIN ALFA 300 MCG/0.6ML IJ SOSY
300.0000 ug | PREFILLED_SYRINGE | Freq: Once | INTRAMUSCULAR | Status: AC
  Administered 2014-11-02: 300 ug via SUBCUTANEOUS
  Filled 2014-11-02: qty 0.6

## 2014-11-02 MED ORDER — ACETAMINOPHEN 325 MG PO TABS
ORAL_TABLET | ORAL | Status: AC
  Filled 2014-11-02: qty 2

## 2014-11-02 MED ORDER — DIPHENHYDRAMINE HCL 25 MG PO CAPS
ORAL_CAPSULE | ORAL | Status: AC
  Filled 2014-11-02: qty 1

## 2014-11-02 NOTE — Patient Instructions (Signed)

## 2014-11-02 NOTE — Progress Notes (Signed)
Hematology and Oncology Follow Up Visit  Kyle Heath [id number] 02/27/1927 80 y.o. 11/02/2014 9:46 AM    Principle Diagnosis: 80 year old gentleman with diagnosis of pancytopenia  due to myelofibrosis. He he might have an element of myelodysplastic syndrome as well. Bone marrow biopsy in 01/2012 confirmed the diagnosis.   Current therapy:  Supportive transfusions every 2 weeks as needed and Aransep 300 mcg every two weeks.   Interim History:  Kyle Heath presents today for a routine visit with his daughter. Since the last visit, he does not report any new changes. He continues to be transfusion dependent but remains asymptomatic at this time. He does report slight fatigue especially at the end of the two-week interval. He is well and maintains his weight. Did not report any complications from growth factor supports or transfusions. He continues to ambulate without the aid of a walker without any difficulty. He has not reported any falls or syncope. He has not reported any chest pain or shortness of breath or dyspnea on exertion His level of fatigue remains at about baseline. Denies chest pain, shortness of breath, and dyspnea.  He is not reporting any recent hospitalization or emesis. Is not reporting any recent bleeding or infections. He has not reported any hematochezia or melena. Has not reported any frequency urgency or hesitancy. Has not reported any lymphadenopathy or rash. Has not reported any constitutional symptoms weight loss or appetite changes. His quality of life remains about the same. Remainder of his review of systems is unremarkable.   Medications: I have reviewed the patient's current medications. Current Outpatient Prescriptions  Medication Sig Dispense Refill  . aspirin 81 MG tablet Take 81 mg by mouth daily after breakfast.     . carvedilol (COREG) 25 MG tablet Take 25 mg by mouth daily.  0  . febuxostat (ULORIC) 40 MG tablet Take 80 mg by mouth daily.     . furosemide (LASIX)  40 MG tablet Take 40 mg by mouth daily.  0  . HYDROcodone-acetaminophen (NORCO/VICODIN) 5-325 MG per tablet Take 1-2 tablets by mouth every 6 (six) hours as needed for moderate pain. 20 tablet 0  . levothyroxine (SYNTHROID, LEVOTHROID) 88 MCG tablet Take 88 mcg by mouth daily before breakfast.    . losartan (COZAAR) 100 MG tablet Take 50 mg by mouth daily.     . Multiple Vitamin (MULTIVITAMIN WITH MINERALS) TABS Take 1 tablet by mouth daily.    . potassium chloride SA (K-DUR,KLOR-CON) 20 MEQ tablet Take 1 tablet (20 mEq total) by mouth daily. 30 tablet 1  . TAZTIA XT 360 MG 24 hr capsule Take 360 mg by mouth daily.    . carvedilol (COREG) 25 MG tablet Take 1 tablet (25 mg total) by mouth 2 (two) times daily with a meal. 60 tablet 0  . diltiazem (CARDIZEM CD) 360 MG 24 hr capsule Take 1 capsule (360 mg total) by mouth daily. 30 capsule 2  . furosemide (LASIX) 40 MG tablet Take 1 tablet (40 mg total) by mouth daily. 30 tablet 1   No current facility-administered medications for this visit.   Facility-Administered Medications Ordered in Other Visits  Medication Dose Route Frequency Provider Last Rate Last Dose  . furosemide (LASIX) injection 20 mg  20 mg Intravenous Once [name], NP   Stopped at 02/13/13 1623     Allergies: No Known Allergies  Past Medical History, Surgical history, Social history, and Family History were reviewed and updated.   Physical Exam: Blood pressure 127/39, pulse  72, temperature 98.1 F (36.7 C), temperature source Oral, resp. rate 18, height 5' 9"  (1.753 m), weight 165 lb 9.6 oz (75.116 kg), SpO2 100 %.  ECOG: 1  General appearance: alert. Not in any distress. Head: Normocephalic, without obvious abnormality, atraumatic Neck: no adenopathy Lymph nodes: Cervical, supraclavicular, and axillary nodes normal. Heart:regular rate and rhythm, S1, S2. Lung:chest clear, no wheezing, rales, normal symmetric air entry Abdomen: soft, non-tender, without masses  or organomegaly EXT:no erythema, induration, or nodules. Trace edema noted in his lower extremities bilaterally.  Lab Results:  CBC    Component Value Date/Time   WBC 2.0* 11/02/2014 0848   WBC 2.3* 01/21/2013 1431   RBC 2.42* 11/02/2014 0848   RBC 3.12* 01/21/2013 1431   HGB 7.0* 11/02/2014 0848   HGB 9.0* 01/21/2013 1431   HCT 22.3* 11/02/2014 0848   HCT 27.4* 01/21/2013 1431   PLT 97* 11/02/2014 0848   PLT 217 01/21/2013 1431   MCV 92.1 11/02/2014 0848   MCV 87.8 01/21/2013 1431   MCH 28.9 11/02/2014 0848   MCH 28.8 01/21/2013 1431   MCHC 31.4* 11/02/2014 0848   MCHC 32.8 01/21/2013 1431   RDW 20.6* 11/02/2014 0848   RDW 20.3* 01/21/2013 1431   LYMPHSABS 0.6* 11/02/2014 0848   MONOABS 0.1 11/02/2014 0848   EOSABS 0.0 11/02/2014 0848   BASOSABS 0.0 11/02/2014 0848      Impression and Plan:  80 year old gentleman with the following issues:  1. 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 He could also have an element of myelodysplastic syndrome as well. He is currently on supportive care only. He does not have abdominal pain or a large spleen. His hemoglobin is low today at 7.0 gm/dL and will require packed red cell transfusion. We will continue with growth factor support every 2 weeks. His quality of life is maintained with the supportive measures.  2. Neutropenia: Stable he has not had any recurrent infections. May need to add GSF if absolute neutrophil count is 0.5 or less  3. Thrombocytopenia : Stable. No active bleeding. No transfusion indicated. His platelet count has been relatively stable.  4. HTN: On Cardizem and Cozaar per PCP.  5. IV Access: Poor peripheral IV access over the patient prefers to not pursue a Port-A-Cath at this time.   6. Followup: Every 2 weeks for lab, injection, and possible transfusion. He will have a clinical evaluation on 12/10/2014  [facility], MD 12/15/20159:46 AM

## 2014-11-03 LAB — TYPE AND SCREEN
ABO/RH(D): O POS
Antibody Screen: NEGATIVE
UNIT DIVISION: 0
Unit division: 0

## 2014-11-16 ENCOUNTER — Ambulatory Visit: Payer: Medicare Other

## 2014-11-16 ENCOUNTER — Other Ambulatory Visit: Payer: Self-pay | Admitting: *Deleted

## 2014-11-16 ENCOUNTER — Ambulatory Visit (HOSPITAL_BASED_OUTPATIENT_CLINIC_OR_DEPARTMENT_OTHER): Payer: Medicare Other

## 2014-11-16 ENCOUNTER — Other Ambulatory Visit (HOSPITAL_BASED_OUTPATIENT_CLINIC_OR_DEPARTMENT_OTHER): Payer: Medicare Other

## 2014-11-16 VITALS — BP 123/39 | HR 56 | Temp 97.6°F | Resp 18

## 2014-11-16 DIAGNOSIS — D471 Chronic myeloproliferative disease: Secondary | ICD-10-CM | POA: Diagnosis not present

## 2014-11-16 DIAGNOSIS — D508 Other iron deficiency anemias: Secondary | ICD-10-CM

## 2014-11-16 LAB — CBC WITH DIFFERENTIAL/PLATELET
BASO%: 2.2 % — ABNORMAL HIGH (ref 0.0–2.0)
BASOS ABS: 0.1 10*3/uL (ref 0.0–0.1)
EOS ABS: 0.1 10*3/uL (ref 0.0–0.5)
EOS%: 2.2 % (ref 0.0–7.0)
HEMATOCRIT: 23.4 % — AB (ref 38.4–49.9)
HGB: 7.2 g/dL — ABNORMAL LOW (ref 13.0–17.1)
LYMPH%: 29.9 % (ref 14.0–49.0)
MCH: 28.2 pg (ref 27.2–33.4)
MCHC: 30.8 g/dL — AB (ref 32.0–36.0)
MCV: 91.8 fL (ref 79.3–98.0)
MONO#: 0.1 10*3/uL (ref 0.1–0.9)
MONO%: 6.1 % (ref 0.0–14.0)
NEUT%: 59.6 % (ref 39.0–75.0)
NEUTROS ABS: 1.4 10*3/uL — AB (ref 1.5–6.5)
PLATELETS: 119 10*3/uL — AB (ref 140–400)
RBC: 2.55 10*6/uL — ABNORMAL LOW (ref 4.20–5.82)
RDW: 21.2 % — ABNORMAL HIGH (ref 11.0–14.6)
WBC: 2.3 10*3/uL — AB (ref 4.0–10.3)
lymph#: 0.7 10*3/uL — ABNORMAL LOW (ref 0.9–3.3)
nRBC: 3 % — ABNORMAL HIGH (ref 0–0)

## 2014-11-16 LAB — PREPARE RBC (CROSSMATCH)

## 2014-11-16 LAB — TECHNOLOGIST REVIEW

## 2014-11-16 LAB — HOLD TUBE, BLOOD BANK

## 2014-11-16 MED ORDER — DIPHENHYDRAMINE HCL 25 MG PO CAPS
ORAL_CAPSULE | ORAL | Status: AC
  Filled 2014-11-16: qty 1

## 2014-11-16 MED ORDER — ACETAMINOPHEN 325 MG PO TABS
650.0000 mg | ORAL_TABLET | Freq: Once | ORAL | Status: AC
  Administered 2014-11-16: 650 mg via ORAL

## 2014-11-16 MED ORDER — SODIUM CHLORIDE 0.9 % IV SOLN
250.0000 mL | Freq: Once | INTRAVENOUS | Status: AC
  Administered 2014-11-16: 250 mL via INTRAVENOUS

## 2014-11-16 MED ORDER — DARBEPOETIN ALFA 300 MCG/0.6ML IJ SOSY
300.0000 ug | PREFILLED_SYRINGE | Freq: Once | INTRAMUSCULAR | Status: AC
  Administered 2014-11-16: 300 ug via SUBCUTANEOUS
  Filled 2014-11-16: qty 0.6

## 2014-11-16 MED ORDER — ACETAMINOPHEN 325 MG PO TABS
ORAL_TABLET | ORAL | Status: AC
  Filled 2014-11-16: qty 2

## 2014-11-16 MED ORDER — DIPHENHYDRAMINE HCL 25 MG PO CAPS
25.0000 mg | ORAL_CAPSULE | Freq: Once | ORAL | Status: AC
  Administered 2014-11-16: 25 mg via ORAL

## 2014-11-16 NOTE — Patient Instructions (Signed)

## 2014-11-17 LAB — TYPE AND SCREEN
ABO/RH(D): O POS
Antibody Screen: NEGATIVE
UNIT DIVISION: 0
Unit division: 0

## 2014-11-20 ENCOUNTER — Ambulatory Visit (HOSPITAL_COMMUNITY)
Admission: RE | Admit: 2014-11-20 | Discharge: 2014-11-20 | Disposition: A | Discharge status: Home / Self Care | Payer: Medicare Other | Source: Ambulatory Visit | Attending: Oncology | Admitting: Oncology

## 2014-11-20 DIAGNOSIS — D509 Iron deficiency anemia, unspecified: Secondary | ICD-10-CM | POA: Insufficient documentation

## 2014-11-29 ENCOUNTER — Other Ambulatory Visit (HOSPITAL_BASED_OUTPATIENT_CLINIC_OR_DEPARTMENT_OTHER): Payer: Medicare Other

## 2014-11-29 ENCOUNTER — Ambulatory Visit (HOSPITAL_BASED_OUTPATIENT_CLINIC_OR_DEPARTMENT_OTHER): Payer: Medicare Other

## 2014-11-29 ENCOUNTER — Ambulatory Visit: Payer: Medicare Other

## 2014-11-29 VITALS — BP 110/37 | HR 53 | Temp 97.5°F | Resp 18

## 2014-11-29 DIAGNOSIS — D471 Chronic myeloproliferative disease: Secondary | ICD-10-CM

## 2014-11-29 DIAGNOSIS — D531 Other megaloblastic anemias, not elsewhere classified: Secondary | ICD-10-CM

## 2014-11-29 DIAGNOSIS — D509 Iron deficiency anemia, unspecified: Secondary | ICD-10-CM

## 2014-11-29 LAB — CBC WITH DIFFERENTIAL/PLATELET
BASO%: 1.7 % (ref 0.0–2.0)
BASOS ABS: 0 10*3/uL (ref 0.0–0.1)
EOS ABS: 0 10*3/uL (ref 0.0–0.5)
EOS%: 1.7 % (ref 0.0–7.0)
HCT: 25.6 % — ABNORMAL LOW (ref 38.4–49.9)
HGB: 7.8 g/dL — ABNORMAL LOW (ref 13.0–17.1)
LYMPH%: 27.8 % (ref 14.0–49.0)
MCH: 27.5 pg (ref 27.2–33.4)
MCHC: 30.5 g/dL — ABNORMAL LOW (ref 32.0–36.0)
MCV: 90.1 fL (ref 79.3–98.0)
MONO#: 0.2 10*3/uL (ref 0.1–0.9)
MONO%: 6.4 % (ref 0.0–14.0)
NEUT#: 1.5 10*3/uL (ref 1.5–6.5)
NEUT%: 62.4 % (ref 39.0–75.0)
Platelets: 127 10*3/uL — ABNORMAL LOW (ref 140–400)
RBC: 2.84 10*6/uL — AB (ref 4.20–5.82)
RDW: 20.5 % — ABNORMAL HIGH (ref 11.0–14.6)
WBC: 2.3 10*3/uL — ABNORMAL LOW (ref 4.0–10.3)
lymph#: 0.7 10*3/uL — ABNORMAL LOW (ref 0.9–3.3)
nRBC: 2 % — ABNORMAL HIGH (ref 0–0)

## 2014-11-29 LAB — PREPARE RBC (CROSSMATCH)

## 2014-11-29 LAB — TECHNOLOGIST REVIEW

## 2014-11-29 LAB — HOLD TUBE, BLOOD BANK

## 2014-11-29 MED ORDER — SODIUM CHLORIDE 0.9 % IV SOLN
250.0000 mL | Freq: Once | INTRAVENOUS | Status: AC
  Administered 2014-11-29: 250 mL via INTRAVENOUS

## 2014-11-29 MED ORDER — DIPHENHYDRAMINE HCL 25 MG PO CAPS
ORAL_CAPSULE | ORAL | Status: AC
  Filled 2014-11-29: qty 1

## 2014-11-29 MED ORDER — ACETAMINOPHEN 325 MG PO TABS
650.0000 mg | ORAL_TABLET | Freq: Once | ORAL | Status: AC
  Administered 2014-11-29: 650 mg via ORAL

## 2014-11-29 MED ORDER — DIPHENHYDRAMINE HCL 25 MG PO CAPS
25.0000 mg | ORAL_CAPSULE | Freq: Once | ORAL | Status: AC
  Administered 2014-11-29: 25 mg via ORAL

## 2014-11-29 MED ORDER — ACETAMINOPHEN 325 MG PO TABS
ORAL_TABLET | ORAL | Status: AC
  Filled 2014-11-29: qty 2

## 2014-11-29 NOTE — Patient Instructions (Signed)

## 2014-11-30 LAB — TYPE AND SCREEN
ABO/RH(D): O POS
Antibody Screen: NEGATIVE
UNIT DIVISION: 0
Unit division: 0

## 2014-12-13 ENCOUNTER — Telehealth: Payer: Self-pay | Admitting: *Deleted

## 2014-12-13 ENCOUNTER — Other Ambulatory Visit: Payer: Self-pay | Admitting: *Deleted

## 2014-12-13 ENCOUNTER — Encounter: Payer: Self-pay | Admitting: Physician Assistant

## 2014-12-13 ENCOUNTER — Ambulatory Visit (HOSPITAL_BASED_OUTPATIENT_CLINIC_OR_DEPARTMENT_OTHER): Payer: Medicare Other | Admitting: Physician Assistant

## 2014-12-13 ENCOUNTER — Telehealth: Payer: Self-pay | Admitting: Physician Assistant

## 2014-12-13 ENCOUNTER — Other Ambulatory Visit (HOSPITAL_BASED_OUTPATIENT_CLINIC_OR_DEPARTMENT_OTHER): Payer: Medicare Other

## 2014-12-13 ENCOUNTER — Ambulatory Visit (HOSPITAL_BASED_OUTPATIENT_CLINIC_OR_DEPARTMENT_OTHER): Payer: Medicare Other

## 2014-12-13 VITALS — BP 126/44 | HR 83 | Temp 97.8°F | Resp 18 | Ht 69.0 in | Wt 165.3 lb

## 2014-12-13 DIAGNOSIS — D471 Chronic myeloproliferative disease: Secondary | ICD-10-CM

## 2014-12-13 DIAGNOSIS — D509 Iron deficiency anemia, unspecified: Secondary | ICD-10-CM

## 2014-12-13 DIAGNOSIS — D649 Anemia, unspecified: Secondary | ICD-10-CM

## 2014-12-13 DIAGNOSIS — D641 Secondary sideroblastic anemia due to disease: Secondary | ICD-10-CM

## 2014-12-13 DIAGNOSIS — D531 Other megaloblastic anemias, not elsewhere classified: Secondary | ICD-10-CM

## 2014-12-13 LAB — CBC WITH DIFFERENTIAL/PLATELET
BASO%: 1.5 % (ref 0.0–2.0)
Basophils Absolute: 0 10*3/uL (ref 0.0–0.1)
EOS%: 3.8 % (ref 0.0–7.0)
Eosinophils Absolute: 0.1 10*3/uL (ref 0.0–0.5)
HCT: 26.2 % — ABNORMAL LOW (ref 38.4–49.9)
HGB: 8.2 g/dL — ABNORMAL LOW (ref 13.0–17.1)
LYMPH#: 0.7 10*3/uL — AB (ref 0.9–3.3)
LYMPH%: 27.1 % (ref 14.0–49.0)
MCH: 27.8 pg (ref 27.2–33.4)
MCHC: 31.3 g/dL — ABNORMAL LOW (ref 32.0–36.0)
MCV: 88.8 fL (ref 79.3–98.0)
MONO#: 0.2 10*3/uL (ref 0.1–0.9)
MONO%: 6.9 % (ref 0.0–14.0)
NEUT#: 1.6 10*3/uL (ref 1.5–6.5)
NEUT%: 60.7 % (ref 39.0–75.0)
NRBC: 0 % (ref 0–0)
Platelets: 100 10*3/uL — ABNORMAL LOW (ref 140–400)
RBC: 2.95 10*6/uL — ABNORMAL LOW (ref 4.20–5.82)
RDW: 20.8 % — ABNORMAL HIGH (ref 11.0–14.6)
WBC: 2.6 10*3/uL — AB (ref 4.0–10.3)

## 2014-12-13 LAB — TECHNOLOGIST REVIEW: Technologist Review: 4

## 2014-12-13 LAB — HOLD TUBE, BLOOD BANK

## 2014-12-13 MED ORDER — DARBEPOETIN ALFA 300 MCG/0.6ML IJ SOSY
300.0000 ug | PREFILLED_SYRINGE | Freq: Once | INTRAMUSCULAR | Status: AC
  Administered 2014-12-13: 300 ug via SUBCUTANEOUS
  Filled 2014-12-13: qty 0.6

## 2014-12-13 NOTE — Patient Instructions (Signed)
Follow-up in 2 weeks for repeat labs and injection and possible blood transfusion pending her counts and performance status Follow-up in 1 month for labs injection possible transfusion and provider visit

## 2014-12-13 NOTE — Telephone Encounter (Signed)
Gave ans & calendar for February. Sent message to schedule blood transfusion.

## 2014-12-13 NOTE — Progress Notes (Signed)
Hematology and Oncology Follow Up Visit  Kyle Heath [id number] [birth year] 81 y.o. 12/13/2014 11:02 AM    Principle Diagnosis: 81 year old gentleman with diagnosis of pancytopenia  due to myelofibrosis. He he might have an element of myelodysplastic syndrome as well. Bone marrow biopsy in 01/2012 confirmed the diagnosis.   Current therapy:  Supportive transfusions every 2 weeks as needed and Aransep 300 mcg every two weeks.   Interim History:  [name] presents today for a routine visit with his daughter. Since the last visit, he does not report any new changes. He continues to be transfusion dependent but remains asymptomatic at this time. He reports improved energy today and does not feel as though he will require blood transfusion today.  He is well and maintains his weight. Did not report any complications from growth factor supports or transfusions. He continues to ambulate without the aid of a walker without any difficulty. He has not reported any falls or syncope. He has not reported any chest pain or shortness of breath or dyspnea on exertion His level of fatigue remains at about baseline. Denies chest pain, shortness of breath, and dyspnea.  He is not reporting any recent hospitalization or emesis. Is not reporting any recent bleeding or infections. He has not reported any hematochezia or melena. Has not reported any frequency urgency or hesitancy. Has not reported any lymphadenopathy or rash. Has not reported any constitutional symptoms weight loss or appetite changes. His quality of life remains about the same. Remainder of his review of systems is unremarkable.   Medications: I have reviewed the patient's current medications. Current Outpatient Prescriptions  Medication Sig Dispense Refill  . aspirin 81 MG tablet Take 81 mg by mouth daily after breakfast.     . carvedilol (COREG) 25 MG tablet Take 25 mg by mouth daily.  0  . febuxostat (ULORIC) 40 MG tablet Take 80 mg by mouth daily.      . furosemide (LASIX) 40 MG tablet Take 40 mg by mouth daily.  0  . HYDROcodone-acetaminophen (NORCO/VICODIN) 5-325 MG per tablet Take 1-2 tablets by mouth every 6 (six) hours as needed for moderate pain. 20 tablet 0  . levothyroxine (SYNTHROID, LEVOTHROID) 88 MCG tablet Take 88 mcg by mouth daily before breakfast.    . losartan (COZAAR) 100 MG tablet Take 50 mg by mouth daily.     . Multiple Vitamin (MULTIVITAMIN WITH MINERALS) TABS Take 1 tablet by mouth daily.    . potassium chloride SA (K-DUR,KLOR-CON) 20 MEQ tablet Take 1 tablet (20 mEq total) by mouth daily. 30 tablet 1  . TAZTIA XT 360 MG 24 hr capsule Take 360 mg by mouth daily.    . carvedilol (COREG) 25 MG tablet Take 1 tablet (25 mg total) by mouth 2 (two) times daily with a meal. 60 tablet 0  . diltiazem (CARDIZEM CD) 360 MG 24 hr capsule Take 1 capsule (360 mg total) by mouth daily. 30 capsule 2  . furosemide (LASIX) 40 MG tablet Take 1 tablet (40 mg total) by mouth daily. 30 tablet 1   No current facility-administered medications for this visit.   Facility-Administered Medications Ordered in Other Visits  Medication Dose Route Frequency Provider Last Rate Last Dose  . furosemide (LASIX) injection 20 mg  20 mg Intravenous Once [name], NP   Stopped at 02/13/13 1623     Allergies: No Known Allergies  Past Medical History, Surgical history, Social history, and Family History were reviewed and updated.   Physical  Exam: Blood pressure 126/44, pulse 83, temperature 97.8 F (36.6 C), temperature source Oral, resp. rate 18, height 5' 9"  (1.753 m), weight 165 lb 4.8 oz (74.98 kg), SpO2 98 %.  ECOG: 1  General appearance: alert. Not in any distress. Head: Normocephalic, without obvious abnormality, atraumatic Neck: no adenopathy Lymph nodes: Cervical, supraclavicular, and axillary nodes normal. Heart:regular rate and rhythm, S1, S2. Lung:chest clear, no wheezing, rales, normal symmetric air entry Abdomen: soft,  non-tender, without masses or organomegaly EXT:no erythema, induration, or nodules. Trace edema noted in his lower extremities bilaterally.  Lab Results:  CBC    Component Value Date/Time   WBC 2.6* 12/13/2014 0904   WBC 2.3* 01/21/2013 1431   RBC 2.95* 12/13/2014 0904   RBC 3.12* 01/21/2013 1431   HGB 8.2* 12/13/2014 0904   HGB 9.0* 01/21/2013 1431   HCT 26.2* 12/13/2014 0904   HCT 27.4* 01/21/2013 1431   PLT 100* 12/13/2014 0904   PLT 217 01/21/2013 1431   MCV 88.8 12/13/2014 0904   MCV 87.8 01/21/2013 1431   MCH 27.8 12/13/2014 0904   MCH 28.8 01/21/2013 1431   MCHC 31.3* 12/13/2014 0904   MCHC 32.8 01/21/2013 1431   RDW 20.8* 12/13/2014 0904   RDW 20.3* 01/21/2013 1431   LYMPHSABS 0.7* 12/13/2014 0904   MONOABS 0.2 12/13/2014 0904   EOSABS 0.1 12/13/2014 0904   BASOSABS 0.0 12/13/2014 0904      Impression and Plan:  81 year old gentleman with the following issues:  1. 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 He could also have an element of myelodysplastic syndrome as well. He is currently on supportive care only. He does not have abdominal pain or a large spleen. His hemoglobin is improved today at 8.2 g/dL and he is asymptomatic. We will not move forward with packed red blood cell transfusion today. He will return in 2 weeks with a recheck of his counts. I have ordered a hold tube as well as scheduled possible blood transfusion at that time.  We will continue with growth factor support every 2 weeks. His quality of life is maintained with the supportive measures.  2. Neutropenia: Stable he has not had any recurrent infections. May need to add GSF if absolute neutrophil count is 0.5 or less  3. Thrombocytopenia : Stable. No active bleeding. No transfusion indicated. His platelet count has been relatively stable.  4. HTN: On Cardizem and Cozaar per PCP.  5. IV Access: Poor peripheral IV access over the patient prefers to not pursue a  Port-A-Cath at this time.   6. Followup: Every 2 weeks for lab, injection, and possible transfusion. He will have a clinical evaluation on 01/11/2015  Sampson Si  1/25/201611:02 AM

## 2014-12-13 NOTE — Telephone Encounter (Signed)
Per staff message and POF I have scheduled appts. Advised scheduler of appts and that MD visit on 2/24 to late for 2units of blood  [name]

## 2014-12-15 ENCOUNTER — Telehealth: Payer: Self-pay | Admitting: Oncology

## 2014-12-15 ENCOUNTER — Telehealth: Payer: Self-pay | Admitting: *Deleted

## 2014-12-15 NOTE — Telephone Encounter (Signed)
Per staff message and POF I have scheduled appts. Advised scheduler of appts. [name]  

## 2014-12-15 NOTE — Telephone Encounter (Signed)
Left message to confirm appointments for 02/24 & 02/25

## 2014-12-20 ENCOUNTER — Ambulatory Visit (HOSPITAL_COMMUNITY)
Admission: RE | Admit: 2014-12-20 | Discharge: 2014-12-20 | Disposition: A | Discharge status: Home / Self Care | Payer: Medicare Other | Source: Ambulatory Visit | Attending: Oncology | Admitting: Oncology

## 2014-12-20 DIAGNOSIS — D509 Iron deficiency anemia, unspecified: Secondary | ICD-10-CM

## 2014-12-20 DIAGNOSIS — D7581 Myelofibrosis: Secondary | ICD-10-CM | POA: Insufficient documentation

## 2014-12-20 DIAGNOSIS — I10 Essential (primary) hypertension: Secondary | ICD-10-CM | POA: Insufficient documentation

## 2014-12-20 DIAGNOSIS — D649 Anemia, unspecified: Secondary | ICD-10-CM

## 2014-12-20 DIAGNOSIS — D61818 Other pancytopenia: Secondary | ICD-10-CM | POA: Insufficient documentation

## 2014-12-20 DIAGNOSIS — Z79899 Other long term (current) drug therapy: Secondary | ICD-10-CM | POA: Insufficient documentation

## 2014-12-20 DIAGNOSIS — Z7982 Long term (current) use of aspirin: Secondary | ICD-10-CM | POA: Insufficient documentation

## 2014-12-21 ENCOUNTER — Telehealth: Payer: Self-pay | Admitting: *Deleted

## 2014-12-21 NOTE — Telephone Encounter (Signed)
This RN spoke with patient regarding message from daughter that he was having shortness of breath and needed a blood transfusion. Patient denied SOB. He doesn't want to come to [facility] and have labs checked. He stated,"I'll check with my daughter and I will call you if I need blood." I instructed patient that if he started having increased SOB tonight to go to the ER. He verbalized understanding.

## 2014-12-21 NOTE — Telephone Encounter (Signed)
PT. HAS NO ENERGY. "HE IS MOANING AND GROANING" BUT STATES NOTHING IS WRONG. PT. IS SLIGHTLY SHORT OF BREATH.

## 2014-12-27 ENCOUNTER — Other Ambulatory Visit (HOSPITAL_BASED_OUTPATIENT_CLINIC_OR_DEPARTMENT_OTHER): Payer: Medicare Other

## 2014-12-27 ENCOUNTER — Ambulatory Visit (HOSPITAL_BASED_OUTPATIENT_CLINIC_OR_DEPARTMENT_OTHER): Payer: Medicare Other

## 2014-12-27 VITALS — BP 127/38 | HR 55 | Temp 97.6°F | Resp 18

## 2014-12-27 DIAGNOSIS — Z7982 Long term (current) use of aspirin: Secondary | ICD-10-CM | POA: Diagnosis not present

## 2014-12-27 DIAGNOSIS — D649 Anemia, unspecified: Secondary | ICD-10-CM

## 2014-12-27 DIAGNOSIS — D61818 Other pancytopenia: Secondary | ICD-10-CM | POA: Diagnosis present

## 2014-12-27 DIAGNOSIS — I10 Essential (primary) hypertension: Secondary | ICD-10-CM | POA: Diagnosis not present

## 2014-12-27 DIAGNOSIS — D7581 Myelofibrosis: Secondary | ICD-10-CM | POA: Diagnosis not present

## 2014-12-27 DIAGNOSIS — D47Z9 Other specified neoplasms of uncertain behavior of lymphoid, hematopoietic and related tissue: Secondary | ICD-10-CM

## 2014-12-27 DIAGNOSIS — D471 Chronic myeloproliferative disease: Secondary | ICD-10-CM

## 2014-12-27 DIAGNOSIS — Z79899 Other long term (current) drug therapy: Secondary | ICD-10-CM | POA: Diagnosis not present

## 2014-12-27 LAB — CBC WITH DIFFERENTIAL/PLATELET
BASO%: 1.9 % (ref 0.0–2.0)
BASOS ABS: 0.1 10*3/uL (ref 0.0–0.1)
EOS%: 1.9 % (ref 0.0–7.0)
Eosinophils Absolute: 0.1 10*3/uL (ref 0.0–0.5)
HCT: 24.3 % — ABNORMAL LOW (ref 38.4–49.9)
HEMOGLOBIN: 7.2 g/dL — AB (ref 13.0–17.1)
LYMPH#: 0.7 10*3/uL — AB (ref 0.9–3.3)
LYMPH%: 24.7 % (ref 14.0–49.0)
MCH: 27 pg — ABNORMAL LOW (ref 27.2–33.4)
MCHC: 29.6 g/dL — ABNORMAL LOW (ref 32.0–36.0)
MCV: 91 fL (ref 79.3–98.0)
MONO#: 0.2 10*3/uL (ref 0.1–0.9)
MONO%: 8.2 % (ref 0.0–14.0)
NEUT%: 63.3 % (ref 39.0–75.0)
NEUTROS ABS: 1.7 10*3/uL (ref 1.5–6.5)
NRBC: 7 % — AB (ref 0–0)
Platelets: 85 10*3/uL — ABNORMAL LOW (ref 140–400)
RBC: 2.67 10*6/uL — ABNORMAL LOW (ref 4.20–5.82)
RDW: 25 % — AB (ref 11.0–14.6)
WBC: 2.7 10*3/uL — ABNORMAL LOW (ref 4.0–10.3)

## 2014-12-27 LAB — PREPARE RBC (CROSSMATCH)

## 2014-12-27 LAB — HOLD TUBE, BLOOD BANK

## 2014-12-27 LAB — TECHNOLOGIST REVIEW

## 2014-12-27 MED ORDER — DIPHENHYDRAMINE HCL 25 MG PO CAPS
ORAL_CAPSULE | ORAL | Status: AC
  Filled 2014-12-27: qty 1

## 2014-12-27 MED ORDER — SODIUM CHLORIDE 0.9 % IV SOLN
250.0000 mL | Freq: Once | INTRAVENOUS | Status: AC
  Administered 2014-12-27: 250 mL via INTRAVENOUS

## 2014-12-27 MED ORDER — ACETAMINOPHEN 325 MG PO TABS
650.0000 mg | ORAL_TABLET | Freq: Once | ORAL | Status: AC
  Administered 2014-12-27: 650 mg via ORAL

## 2014-12-27 MED ORDER — ACETAMINOPHEN 325 MG PO TABS
ORAL_TABLET | ORAL | Status: AC
  Filled 2014-12-27: qty 2

## 2014-12-27 MED ORDER — DIPHENHYDRAMINE HCL 25 MG PO CAPS
25.0000 mg | ORAL_CAPSULE | Freq: Once | ORAL | Status: AC
  Administered 2014-12-27: 25 mg via ORAL

## 2014-12-27 NOTE — Patient Instructions (Signed)

## 2014-12-28 LAB — TYPE AND SCREEN
ABO/RH(D): O POS
ANTIBODY SCREEN: NEGATIVE
UNIT DIVISION: 0
Unit division: 0

## 2015-01-12 ENCOUNTER — Other Ambulatory Visit: Payer: Self-pay | Admitting: *Deleted

## 2015-01-12 ENCOUNTER — Ambulatory Visit (HOSPITAL_BASED_OUTPATIENT_CLINIC_OR_DEPARTMENT_OTHER): Payer: Medicare Other | Admitting: Oncology

## 2015-01-12 ENCOUNTER — Telehealth: Payer: Self-pay | Admitting: *Deleted

## 2015-01-12 ENCOUNTER — Telehealth: Payer: Self-pay | Admitting: Oncology

## 2015-01-12 ENCOUNTER — Ambulatory Visit (HOSPITAL_BASED_OUTPATIENT_CLINIC_OR_DEPARTMENT_OTHER): Payer: Medicare Other

## 2015-01-12 ENCOUNTER — Other Ambulatory Visit (HOSPITAL_BASED_OUTPATIENT_CLINIC_OR_DEPARTMENT_OTHER): Payer: Medicare Other

## 2015-01-12 VITALS — BP 105/64 | HR 72 | Temp 98.1°F | Resp 18 | Ht 69.0 in | Wt 164.2 lb

## 2015-01-12 DIAGNOSIS — D649 Anemia, unspecified: Secondary | ICD-10-CM

## 2015-01-12 DIAGNOSIS — D471 Chronic myeloproliferative disease: Secondary | ICD-10-CM

## 2015-01-12 DIAGNOSIS — D61818 Other pancytopenia: Secondary | ICD-10-CM | POA: Diagnosis not present

## 2015-01-12 DIAGNOSIS — D638 Anemia in other chronic diseases classified elsewhere: Secondary | ICD-10-CM

## 2015-01-12 LAB — TECHNOLOGIST REVIEW

## 2015-01-12 LAB — CBC WITH DIFFERENTIAL/PLATELET
BASO%: 1.7 % (ref 0.0–2.0)
BASOS ABS: 0.1 10*3/uL (ref 0.0–0.1)
EOS ABS: 0.1 10*3/uL (ref 0.0–0.5)
EOS%: 1.7 % (ref 0.0–7.0)
HCT: 25 % — ABNORMAL LOW (ref 38.4–49.9)
HEMOGLOBIN: 7.7 g/dL — AB (ref 13.0–17.1)
LYMPH%: 21.6 % (ref 14.0–49.0)
MCH: 27.5 pg (ref 27.2–33.4)
MCHC: 30.8 g/dL — ABNORMAL LOW (ref 32.0–36.0)
MCV: 89.3 fL (ref 79.3–98.0)
MONO#: 0.3 10*3/uL (ref 0.1–0.9)
MONO%: 8.6 % (ref 0.0–14.0)
NEUT%: 66.4 % (ref 39.0–75.0)
NEUTROS ABS: 1.9 10*3/uL (ref 1.5–6.5)
Platelets: 91 10*3/uL — ABNORMAL LOW (ref 140–400)
RBC: 2.8 10*6/uL — ABNORMAL LOW (ref 4.20–5.82)
RDW: 24.1 % — AB (ref 11.0–14.6)
WBC: 2.9 10*3/uL — ABNORMAL LOW (ref 4.0–10.3)
lymph#: 0.6 10*3/uL — ABNORMAL LOW (ref 0.9–3.3)
nRBC: 3 % — ABNORMAL HIGH (ref 0–0)

## 2015-01-12 LAB — HOLD TUBE, BLOOD BANK

## 2015-01-12 MED ORDER — POTASSIUM CHLORIDE CRYS ER 20 MEQ PO TBCR: 20.0000 meq | EXTENDED_RELEASE_TABLET | Freq: Every day | ORAL | Status: AC

## 2015-01-12 MED ORDER — DARBEPOETIN ALFA 300 MCG/0.6ML IJ SOSY
300.0000 ug | PREFILLED_SYRINGE | Freq: Once | INTRAMUSCULAR | Status: AC
  Administered 2015-01-12: 300 ug via SUBCUTANEOUS
  Filled 2015-01-12: qty 0.6

## 2015-01-12 MED ORDER — FEBUXOSTAT 40 MG PO TABS: 80.0000 mg | ORAL_TABLET | Freq: Every day | ORAL | Status: DC

## 2015-01-12 NOTE — Telephone Encounter (Signed)
Gave avs & calendar for March/April. Sent message to schedule blood.

## 2015-01-12 NOTE — Telephone Encounter (Signed)
Per staff message and POF I have scheduled appts. Advised scheduler of appts. [name]  

## 2015-01-12 NOTE — Addendum Note (Signed)
Addended by: Randolm Idol on: 01/12/2015 12:53 PM   Modules accepted: Medications

## 2015-01-12 NOTE — Progress Notes (Signed)
Hematology and Oncology Follow Up Visit  Kyle Heath [id number] 1927/05/25 81 y.o. 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 He he might have an element of myelodysplastic syndrome as well. Bone marrow biopsy in 01/2012 confirmed the diagnosis.   Current therapy:  Supportive transfusions every 2 weeks as needed and Aransep 300 mcg every two weeks.   Interim History:  Kyle Heath presents today for a routine visit with his daughter. Since the last visit, he feels well. He has not reported any complications related to transfusions or Aranesp. He continues to be transfusion dependent but remains asymptomatic at this time. He reports improved energy today and does not feel as though he will require blood transfusion. He continues to ambulate without the aid of a walker without any difficulty. He has not reported any falls or syncope. He has not reported any chest pain or shortness of breath or dyspnea on exertion His level of fatigue remains at about baseline. Denies chest pain, shortness of breath, and dyspnea.  He is not reporting any recent hospitalization or emesis. Is not reporting any recent bleeding or infections. He has not reported any hematochezia or melena. Has not reported any frequency urgency or hesitancy. Has not reported any lymphadenopathy or rash. Has not reported any constitutional symptoms weight loss or appetite changes. His quality of life remains about the same. Remainder of his review of systems is unremarkable.   Medications: I have reviewed the patient's current medications. Current Outpatient Prescriptions  Medication Sig Dispense Refill  . aspirin 81 MG tablet Take 81 mg by mouth daily after breakfast.     . carvedilol (COREG) 25 MG tablet Take 1 tablet (25 mg total) by mouth 2 (two) times daily with a meal. 60 tablet 0  . carvedilol (COREG) 25 MG tablet Take 25 mg by mouth daily.  0  . diltiazem  (CARDIZEM CD) 360 MG 24 hr capsule Take 1 capsule (360 mg total) by mouth daily. 30 capsule 2  . febuxostat (ULORIC) 40 MG tablet Take 2 tablets (80 mg total) by mouth daily. 60 tablet 0  . furosemide (LASIX) 40 MG tablet Take 1 tablet (40 mg total) by mouth daily. 30 tablet 1  . furosemide (LASIX) 40 MG tablet Take 40 mg by mouth daily.  0  . HYDROcodone-acetaminophen (NORCO/VICODIN) 5-325 MG per tablet Take 1-2 tablets by mouth every 6 (six) hours as needed for moderate pain. 20 tablet 0  . levothyroxine (SYNTHROID, LEVOTHROID) 88 MCG tablet Take 88 mcg by mouth daily before breakfast.    . losartan (COZAAR) 100 MG tablet Take 50 mg by mouth daily.     . Multiple Vitamin (MULTIVITAMIN WITH MINERALS) TABS Take 1 tablet by mouth daily.    . potassium chloride SA (K-DUR,KLOR-CON) 20 MEQ tablet Take 1 tablet (20 mEq total) by mouth daily. 30 tablet 0  . TAZTIA XT 360 MG 24 hr capsule Take 360 mg by mouth daily.     No current facility-administered medications for this visit.   Facility-Administered Medications Ordered in Other Visits  Medication Dose Route Frequency Provider Last Rate Last Dose  . furosemide (LASIX) injection 20 mg  20 mg Intravenous Once [name], NP   Stopped at 02/13/13 1623     Allergies: No Known Allergies  Past Medical History, Surgical history, Social history, and Family History were reviewed and updated.   Physical Exam: Blood pressure 105/64, pulse 72, temperature 98.1 F (36.7  C), temperature source Oral, resp. rate 18, height _0  (1.753 m), weight 164 lb 3.2 oz (74.481 kg).  ECOG: 1  General appearance: alert. Not in any distress. Head: Normocephalic, without obvious abnormality Neck: no adenopathy Lymph nodes: Cervical, supraclavicular, and axillary nodes normal. Heart:regular rate and rhythm, S1, S2. Lung:chest clear, no wheezing, rales, normal symmetric air entry Abdomen: soft, non-tender, without masses or organomegaly EXT:no erythema,  induration, or nodules. Trace edema noted in his lower extremities bilaterally.  Lab Results:  CBC    Component Value Date/Time   WBC 2.7* 12/27/2014 0842   WBC 2.3* 01/21/2013 1431   RBC 2.67* 12/27/2014 0842   RBC 3.12* 01/21/2013 1431   HGB 7.2* 12/27/2014 0842   HGB 9.0* 01/21/2013 1431   HCT 24.3* 12/27/2014 0842   HCT 27.4* 01/21/2013 1431   PLT 85* 12/27/2014 0842   PLT 217 01/21/2013 1431   MCV 91.0 12/27/2014 0842   MCV 87.8 01/21/2013 1431   MCH 27.0* 12/27/2014 0842   MCH 28.8 01/21/2013 1431   MCHC 29.6* 12/27/2014 0842   MCHC 32.8 01/21/2013 1431   RDW 25.0* 12/27/2014 0842   RDW 20.3* 01/21/2013 1431   LYMPHSABS 0.7* 12/27/2014 0842   MONOABS 0.2 12/27/2014 0842   EOSABS 0.1 12/27/2014 0842   BASOSABS 0.1 12/27/2014 0842      Impression and Plan:  81 year old gentleman with the following issues:  1. 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 He could also have an element of myelodysplastic syndrome as well. He is currently on supportive care only. He does not have abdominal pain or a large spleen. His hemoglobin is pending from today but he is asymptomatic. Hemoglobin is above 8 we will hold off on any transfusions. He will continue with Aranesp every 2 weeks to keep his hemoglobin close to 11.  2. Neutropenia: Stable he has not had any recurrent infections. May need to add GSF if absolute neutrophil count is 0.5 or less  3. Thrombocytopenia : Stable. No active bleeding. No transfusion indicated. His platelet count has been relatively stable.  4. HTN: On Cardizem and Cozaar per PCP.  5. IV Access: Poor peripheral IV access over the patient prefers to not pursue a Port-A-Cath at this time.   6. Followup: Every 2 weeks for lab, injection, and possible transfusion. He'll have a clinical visits in 2 months.  [facility], MD 2/24/201611:55 AM

## 2015-01-13 ENCOUNTER — Ambulatory Visit (HOSPITAL_BASED_OUTPATIENT_CLINIC_OR_DEPARTMENT_OTHER): Payer: Medicare Other

## 2015-01-13 ENCOUNTER — Ambulatory Visit: Payer: Medicare Other

## 2015-01-13 VITALS — BP 127/47 | HR 54 | Temp 98.1°F | Resp 20

## 2015-01-13 DIAGNOSIS — D649 Anemia, unspecified: Secondary | ICD-10-CM

## 2015-01-13 DIAGNOSIS — D509 Iron deficiency anemia, unspecified: Secondary | ICD-10-CM

## 2015-01-13 DIAGNOSIS — D61818 Other pancytopenia: Secondary | ICD-10-CM | POA: Diagnosis not present

## 2015-01-13 LAB — TYPE AND SCREEN
ABO/RH(D): O POS
Antibody Screen: NEGATIVE
Unit division: 0
Unit division: 0

## 2015-01-13 LAB — PREPARE RBC (CROSSMATCH)

## 2015-01-13 MED ORDER — DIPHENHYDRAMINE HCL 25 MG PO CAPS
ORAL_CAPSULE | ORAL | Status: AC
  Filled 2015-01-13: qty 1

## 2015-01-13 MED ORDER — ACETAMINOPHEN 325 MG PO TABS
650.0000 mg | ORAL_TABLET | Freq: Once | ORAL | Status: AC
  Administered 2015-01-13: 650 mg via ORAL

## 2015-01-13 MED ORDER — SODIUM CHLORIDE 0.9 % IV SOLN
250.0000 mL | Freq: Once | INTRAVENOUS | Status: AC
Start: 2015-01-13 — End: 2015-01-13
  Administered 2015-01-13: 250 mL via INTRAVENOUS

## 2015-01-13 MED ORDER — ACETAMINOPHEN 325 MG PO TABS
ORAL_TABLET | ORAL | Status: AC
  Filled 2015-01-13: qty 2

## 2015-01-13 MED ORDER — DIPHENHYDRAMINE HCL 25 MG PO CAPS
25.0000 mg | ORAL_CAPSULE | Freq: Once | ORAL | Status: AC
  Administered 2015-01-13: 25 mg via ORAL

## 2015-01-13 NOTE — Patient Instructions (Signed)

## 2015-01-13 NOTE — Progress Notes (Signed)
Pt. Accidentally removed blue blood bank arm band at home. T&C for 2 units PRBC's re-done @ 0905.

## 2015-01-14 LAB — TYPE AND SCREEN
ABO/RH(D): O POS
ANTIBODY SCREEN: NEGATIVE
UNIT DIVISION: 0
Unit division: 0

## 2015-01-18 ENCOUNTER — Ambulatory Visit (HOSPITAL_COMMUNITY)
Admission: RE | Admit: 2015-01-18 | Discharge: 2015-01-18 | Disposition: A | Discharge status: Home / Self Care | Payer: Medicare Other | Source: Ambulatory Visit | Attending: Oncology | Admitting: Oncology

## 2015-01-26 ENCOUNTER — Other Ambulatory Visit (HOSPITAL_BASED_OUTPATIENT_CLINIC_OR_DEPARTMENT_OTHER): Payer: Medicare Other

## 2015-01-26 ENCOUNTER — Ambulatory Visit (HOSPITAL_BASED_OUTPATIENT_CLINIC_OR_DEPARTMENT_OTHER): Payer: Medicare Other

## 2015-01-26 DIAGNOSIS — D509 Iron deficiency anemia, unspecified: Secondary | ICD-10-CM

## 2015-01-26 DIAGNOSIS — N189 Chronic kidney disease, unspecified: Principal | ICD-10-CM

## 2015-01-26 DIAGNOSIS — D649 Anemia, unspecified: Secondary | ICD-10-CM

## 2015-01-26 DIAGNOSIS — D631 Anemia in chronic kidney disease: Secondary | ICD-10-CM

## 2015-01-26 LAB — CBC WITH DIFFERENTIAL/PLATELET
BASO%: 2 % (ref 0.0–2.0)
Basophils Absolute: 0.1 10*3/uL (ref 0.0–0.1)
EOS ABS: 0.1 10*3/uL (ref 0.0–0.5)
EOS%: 2.8 % (ref 0.0–7.0)
HEMATOCRIT: 28.3 % — AB (ref 38.4–49.9)
HEMOGLOBIN: 8.5 g/dL — AB (ref 13.0–17.1)
LYMPH#: 0.6 10*3/uL — AB (ref 0.9–3.3)
LYMPH%: 23 % (ref 14.0–49.0)
MCH: 27.4 pg (ref 27.2–33.4)
MCHC: 30 g/dL — ABNORMAL LOW (ref 32.0–36.0)
MCV: 91.3 fL (ref 79.3–98.0)
MONO#: 0.2 10*3/uL (ref 0.1–0.9)
MONO%: 7.1 % (ref 0.0–14.0)
NEUT#: 1.6 10*3/uL (ref 1.5–6.5)
NEUT%: 65.1 % (ref 39.0–75.0)
PLATELETS: 120 10*3/uL — AB (ref 140–400)
RBC: 3.1 10*6/uL — AB (ref 4.20–5.82)
RDW: 24.6 % — AB (ref 11.0–14.6)
WBC: 2.5 10*3/uL — ABNORMAL LOW (ref 4.0–10.3)
nRBC: 3 % — ABNORMAL HIGH (ref 0–0)

## 2015-01-26 LAB — TECHNOLOGIST REVIEW

## 2015-01-26 LAB — HOLD TUBE, BLOOD BANK

## 2015-01-26 MED ORDER — DARBEPOETIN ALFA 300 MCG/0.6ML IJ SOSY
300.0000 ug | PREFILLED_SYRINGE | Freq: Once | INTRAMUSCULAR | Status: AC
  Administered 2015-01-26: 300 ug via SUBCUTANEOUS
  Filled 2015-01-26: qty 0.6

## 2015-02-09 ENCOUNTER — Ambulatory Visit (HOSPITAL_BASED_OUTPATIENT_CLINIC_OR_DEPARTMENT_OTHER): Payer: Medicare Other

## 2015-02-09 ENCOUNTER — Other Ambulatory Visit (HOSPITAL_BASED_OUTPATIENT_CLINIC_OR_DEPARTMENT_OTHER): Payer: Medicare Other

## 2015-02-09 DIAGNOSIS — D47Z9 Other specified neoplasms of uncertain behavior of lymphoid, hematopoietic and related tissue: Secondary | ICD-10-CM

## 2015-02-09 DIAGNOSIS — D649 Anemia, unspecified: Secondary | ICD-10-CM | POA: Diagnosis not present

## 2015-02-09 DIAGNOSIS — D638 Anemia in other chronic diseases classified elsewhere: Secondary | ICD-10-CM

## 2015-02-09 LAB — CBC WITH DIFFERENTIAL/PLATELET
BASO%: 1.5 % (ref 0.0–2.0)
BASOS ABS: 0 10*3/uL (ref 0.0–0.1)
EOS%: 1.5 % (ref 0.0–7.0)
Eosinophils Absolute: 0 10*3/uL (ref 0.0–0.5)
HCT: 26.4 % — ABNORMAL LOW (ref 38.4–49.9)
HGB: 7.7 g/dL — ABNORMAL LOW (ref 13.0–17.1)
LYMPH%: 23.1 % (ref 14.0–49.0)
MCH: 26.6 pg — ABNORMAL LOW (ref 27.2–33.4)
MCHC: 29.2 g/dL — ABNORMAL LOW (ref 32.0–36.0)
MCV: 91.3 fL (ref 79.3–98.0)
MONO#: 0.2 10*3/uL (ref 0.1–0.9)
MONO%: 7.7 % (ref 0.0–14.0)
NEUT%: 66.2 % (ref 39.0–75.0)
NEUTROS ABS: 1.8 10*3/uL (ref 1.5–6.5)
PLATELETS: 134 10*3/uL — AB (ref 140–400)
RBC: 2.89 10*6/uL — ABNORMAL LOW (ref 4.20–5.82)
RDW: 27.7 % — ABNORMAL HIGH (ref 11.0–14.6)
WBC: 2.7 10*3/uL — ABNORMAL LOW (ref 4.0–10.3)
lymph#: 0.6 10*3/uL — ABNORMAL LOW (ref 0.9–3.3)
nRBC: 6 % — ABNORMAL HIGH (ref 0–0)

## 2015-02-09 LAB — TECHNOLOGIST REVIEW

## 2015-02-09 LAB — HOLD TUBE, BLOOD BANK

## 2015-02-09 MED ORDER — DARBEPOETIN ALFA 300 MCG/0.6ML IJ SOSY
300.0000 ug | PREFILLED_SYRINGE | Freq: Once | INTRAMUSCULAR | Status: AC
  Administered 2015-02-09: 300 ug via SUBCUTANEOUS
  Filled 2015-02-09: qty 0.6

## 2015-02-09 NOTE — Progress Notes (Signed)
Kyle Heath here for Kyle Heath.  Not having any real change in how he feels.   Per Dr [name] no blood today.  Patient and Kyle Heath daughter know to call if he starts having SOB and severe fatigue.

## 2015-02-18 ENCOUNTER — Ambulatory Visit (HOSPITAL_COMMUNITY)
Admission: RE | Admit: 2015-02-18 | Discharge: 2015-02-18 | Disposition: A | Discharge status: Home / Self Care | Payer: Medicare Other | Source: Ambulatory Visit | Attending: Oncology | Admitting: Oncology

## 2015-02-18 DIAGNOSIS — D471 Chronic myeloproliferative disease: Secondary | ICD-10-CM

## 2015-02-18 DIAGNOSIS — D61818 Other pancytopenia: Secondary | ICD-10-CM | POA: Insufficient documentation

## 2015-02-18 DIAGNOSIS — D649 Anemia, unspecified: Secondary | ICD-10-CM

## 2015-02-23 ENCOUNTER — Other Ambulatory Visit (HOSPITAL_BASED_OUTPATIENT_CLINIC_OR_DEPARTMENT_OTHER): Payer: Medicare Other

## 2015-02-23 ENCOUNTER — Telehealth: Payer: Self-pay | Admitting: Physician Assistant

## 2015-02-23 ENCOUNTER — Ambulatory Visit (HOSPITAL_BASED_OUTPATIENT_CLINIC_OR_DEPARTMENT_OTHER): Payer: Medicare Other | Admitting: Physician Assistant

## 2015-02-23 ENCOUNTER — Ambulatory Visit: Payer: Medicare Other

## 2015-02-23 ENCOUNTER — Encounter: Payer: Self-pay | Admitting: Physician Assistant

## 2015-02-23 ENCOUNTER — Other Ambulatory Visit: Payer: Medicare Other

## 2015-02-23 ENCOUNTER — Ambulatory Visit (HOSPITAL_BASED_OUTPATIENT_CLINIC_OR_DEPARTMENT_OTHER): Payer: Medicare Other

## 2015-02-23 VITALS — BP 132/51 | HR 56 | Temp 97.7°F | Resp 20

## 2015-02-23 VITALS — BP 140/51 | HR 91 | Temp 97.1°F | Resp 18 | Ht 69.0 in | Wt 170.3 lb

## 2015-02-23 DIAGNOSIS — D709 Neutropenia, unspecified: Secondary | ICD-10-CM | POA: Diagnosis not present

## 2015-02-23 DIAGNOSIS — D61818 Other pancytopenia: Secondary | ICD-10-CM | POA: Diagnosis present

## 2015-02-23 DIAGNOSIS — D631 Anemia in chronic kidney disease: Secondary | ICD-10-CM | POA: Diagnosis not present

## 2015-02-23 DIAGNOSIS — I10 Essential (primary) hypertension: Secondary | ICD-10-CM | POA: Diagnosis not present

## 2015-02-23 DIAGNOSIS — D696 Thrombocytopenia, unspecified: Secondary | ICD-10-CM

## 2015-02-23 DIAGNOSIS — D649 Anemia, unspecified: Secondary | ICD-10-CM

## 2015-02-23 DIAGNOSIS — D471 Chronic myeloproliferative disease: Secondary | ICD-10-CM

## 2015-02-23 DIAGNOSIS — N189 Chronic kidney disease, unspecified: Secondary | ICD-10-CM

## 2015-02-23 LAB — HOLD TUBE, BLOOD BANK

## 2015-02-23 LAB — CBC WITH DIFFERENTIAL/PLATELET
BASO%: 2.2 % — ABNORMAL HIGH (ref 0.0–2.0)
BASOS ABS: 0.1 10*3/uL (ref 0.0–0.1)
EOS%: 1.8 % (ref 0.0–7.0)
Eosinophils Absolute: 0.1 10*3/uL (ref 0.0–0.5)
HCT: 26 % — ABNORMAL LOW (ref 38.4–49.9)
HEMOGLOBIN: 7.5 g/dL — AB (ref 13.0–17.1)
LYMPH%: 24.8 % (ref 14.0–49.0)
MCH: 27.1 pg — AB (ref 27.2–33.4)
MCHC: 28.8 g/dL — ABNORMAL LOW (ref 32.0–36.0)
MCV: 93.9 fL (ref 79.3–98.0)
MONO#: 0.2 10*3/uL (ref 0.1–0.9)
MONO%: 8.3 % (ref 0.0–14.0)
NEUT%: 62.9 % (ref 39.0–75.0)
NEUTROS ABS: 1.8 10*3/uL (ref 1.5–6.5)
NRBC: 8 % — AB (ref 0–0)
Platelets: 109 10*3/uL — ABNORMAL LOW (ref 140–400)
RBC: 2.77 10*6/uL — AB (ref 4.20–5.82)
RDW: 29.6 % — ABNORMAL HIGH (ref 11.0–14.6)
WBC: 2.8 10*3/uL — ABNORMAL LOW (ref 4.0–10.3)
lymph#: 0.7 10*3/uL — ABNORMAL LOW (ref 0.9–3.3)

## 2015-02-23 LAB — PREPARE RBC (CROSSMATCH)

## 2015-02-23 LAB — TECHNOLOGIST REVIEW: Technologist Review: 4

## 2015-02-23 MED ORDER — DIPHENHYDRAMINE HCL 25 MG PO CAPS
25.0000 mg | ORAL_CAPSULE | Freq: Once | ORAL | Status: AC
  Administered 2015-02-23: 25 mg via ORAL

## 2015-02-23 MED ORDER — DIPHENHYDRAMINE HCL 25 MG PO CAPS
ORAL_CAPSULE | ORAL | Status: AC
  Filled 2015-02-23: qty 1

## 2015-02-23 MED ORDER — ACETAMINOPHEN 325 MG PO TABS
650.0000 mg | ORAL_TABLET | Freq: Once | ORAL | Status: AC
  Administered 2015-02-23: 650 mg via ORAL

## 2015-02-23 MED ORDER — ACETAMINOPHEN 325 MG PO TABS
ORAL_TABLET | ORAL | Status: AC
  Filled 2015-02-23: qty 2

## 2015-02-23 MED ORDER — DARBEPOETIN ALFA 300 MCG/0.6ML IJ SOSY
300.0000 ug | PREFILLED_SYRINGE | Freq: Once | INTRAMUSCULAR | Status: AC
  Administered 2015-02-23: 300 ug via SUBCUTANEOUS
  Filled 2015-02-23: qty 0.6

## 2015-02-23 NOTE — Telephone Encounter (Signed)
appointments made and avs printed for patient °

## 2015-02-23 NOTE — Progress Notes (Signed)
Hematology and Oncology Follow Up Visit  Kyle Heath [id number] [birth year] 81 y.o. 02/23/2015 3:33 PM    Principle Diagnosis: 81 year old gentleman with diagnosis of pancytopenia  due to myelofibrosis. He he might have an element of myelodysplastic syndrome as well. Bone marrow biopsy in 01/2012 confirmed the diagnosis.   Current therapy:  Supportive transfusions every 2 weeks as needed and Aransep 300 mcg every two weeks.   Interim History:  Kyle Heath presents today for a routine visit with his daughter. Since the last visit, he feels well. He has not reported any complications related to transfusions or Aranesp. He continues to be transfusion dependent but notes some increased shortness of breath with exertion lately. He feels he will require blood transfusion today. He continues to ambulate without the aid of a walker without any difficulty. He has not reported any falls or syncope. He has not reported any chest pain. His level of fatigue remains at about baseline to slightly increased.  He is not reporting any recent hospitalization or emesis. Is not reporting any recent bleeding or infections. He has not reported any hematochezia or melena. Has not reported any frequency urgency or hesitancy. Has not reported any lymphadenopathy or rash. Has not reported any constitutional symptoms weight loss or appetite changes. His quality of life remains about the same. Remainder of his review of systems is unremarkable.   Medications: I have reviewed the patient's current medications. Current Outpatient Prescriptions  Medication Sig Dispense Refill  . aspirin 81 MG tablet Take 81 mg by mouth daily after breakfast.     . carvedilol (COREG) 25 MG tablet Take 25 mg by mouth daily.  0  . febuxostat (ULORIC) 40 MG tablet Take 2 tablets (80 mg total) by mouth daily. 60 tablet 0  . furosemide (LASIX) 40 MG tablet Take 40 mg by mouth daily.  0  . HYDROcodone-acetaminophen (NORCO/VICODIN) 5-325 MG per tablet  Take 1-2 tablets by mouth every 6 (six) hours as needed for moderate pain. 20 tablet 0  . levothyroxine (SYNTHROID, LEVOTHROID) 88 MCG tablet Take 88 mcg by mouth daily before breakfast.    . Multiple Vitamin (MULTIVITAMIN WITH MINERALS) TABS Take 1 tablet by mouth daily.    . potassium chloride SA (K-DUR,KLOR-CON) 20 MEQ tablet Take 1 tablet (20 mEq total) by mouth daily. 30 tablet 0  . TAZTIA XT 360 MG 24 hr capsule Take 360 mg by mouth daily.    [name] Kitchen ULORIC 80 MG TABS Take 80 mg by mouth daily.  0  . carvedilol (COREG) 25 MG tablet Take 1 tablet (25 mg total) by mouth 2 (two) times daily with a meal. 60 tablet 0  . diltiazem (CARDIZEM CD) 360 MG 24 hr capsule Take 1 capsule (360 mg total) by mouth daily. 30 capsule 2  . furosemide (LASIX) 40 MG tablet Take 1 tablet (40 mg total) by mouth daily. 30 tablet 1   No current facility-administered medications for this visit.   Facility-Administered Medications Ordered in Other Visits  Medication Dose Route Frequency Provider Last Rate Last Dose  . furosemide (LASIX) injection 20 mg  20 mg Intravenous Once [name], NP   Stopped at 02/13/13 1623     Allergies: No Known Allergies  Past Medical History, Surgical history, Social history, and Family History were reviewed and updated.   Physical Exam: Blood pressure 140/51, pulse 91, temperature 97.1 F (36.2 C), temperature source Oral, resp. rate 18, height 5' 9"  (1.753 m), weight 170 lb 4.8 oz (77.248 kg).  ECOG: 1  General appearance: alert. Not in any distress. Head: Normocephalic, without obvious abnormality Neck: no adenopathy Lymph nodes: Cervical, supraclavicular, and axillary nodes normal. Heart:regular rate and rhythm, S1, S2. Lung:chest clear, no wheezing, rales, normal symmetric air entry Abdomen: soft, non-tender, without masses or organomegaly EXT:no erythema, induration, or nodules. Trace edema noted in his lower extremities bilaterally.  Lab Results:  CBC     Component Value Date/Time   WBC 2.8* 02/23/2015 0917   WBC 2.3* 01/21/2013 1431   RBC 2.77* 02/23/2015 0917   RBC 3.12* 01/21/2013 1431   HGB 7.5* 02/23/2015 0917   HGB 9.0* 01/21/2013 1431   HCT 26.0* 02/23/2015 0917   HCT 27.4* 01/21/2013 1431   PLT 109* 02/23/2015 0917   PLT 217 01/21/2013 1431   MCV 93.9 02/23/2015 0917   MCV 87.8 01/21/2013 1431   MCH 27.1* 02/23/2015 0917   MCH 28.8 01/21/2013 1431   MCHC 28.8* 02/23/2015 0917   MCHC 32.8 01/21/2013 1431   RDW 29.6* 02/23/2015 0917   RDW 20.3* 01/21/2013 1431   LYMPHSABS 0.7* 02/23/2015 0917   MONOABS 0.2 02/23/2015 0917   EOSABS 0.1 02/23/2015 0917   BASOSABS 0.1 02/23/2015 0917      Impression and Plan:  81 year old gentleman with the following issues:  1. Anemia as a part of myeloproliferative disorder likely myelofibrosis. He could also have an element of myelodysplastic syndrome as well. He is currently on supportive care only. He does not have abdominal pain or a large spleen. His hemoglobin is 7.5 today and he is symptomatic. We will transfuse him 2 units of packed re blood cells today. He will continue with Aranesp every 2 weeks to keep his hemoglobin close to 11.  2. Neutropenia: Stable he has not had any recurrent infections. This is improved at 1.8 today.May need to add GSF if absolute neutrophil count is 0.5 or less  3. Thrombocytopenia : Stable. No active bleeding. No transfusion indicated. His platelet count has been relatively stable.  4. HTN: On Cardizem and Cozaar per PCP.  5. IV Access: Poor peripheral IV access over the patient prefers to not pursue a Port-A-Cath at this time.   6. Followup: Every 2 weeks for lab, injection, and possible transfusion. He'll have a clinical visits in 2 months.  Carlton Adam, PA-C  4/6/20163:33 PM

## 2015-02-23 NOTE — Patient Instructions (Signed)

## 2015-02-24 LAB — TYPE AND SCREEN
ABO/RH(D): O POS
Antibody Screen: NEGATIVE
Unit division: 0
Unit division: 0

## 2015-02-27 NOTE — Patient Instructions (Signed)
You will receive 2 units of blood today to address your symptomatic anemia Continue with every 2 week lab and aranesp injections, with blood transfusions as needed Follow up in 2 months

## 2015-03-03 ENCOUNTER — Telehealth: Payer: Self-pay | Admitting: Oncology

## 2015-03-03 ENCOUNTER — Encounter: Payer: Self-pay | Admitting: *Deleted

## 2015-03-03 ENCOUNTER — Other Ambulatory Visit: Payer: Self-pay | Admitting: *Deleted

## 2015-03-03 ENCOUNTER — Telehealth: Payer: Self-pay | Admitting: *Deleted

## 2015-03-03 ENCOUNTER — Other Ambulatory Visit (HOSPITAL_BASED_OUTPATIENT_CLINIC_OR_DEPARTMENT_OTHER): Payer: Medicare Other

## 2015-03-03 DIAGNOSIS — D649 Anemia, unspecified: Secondary | ICD-10-CM

## 2015-03-03 DIAGNOSIS — D47Z9 Other specified neoplasms of uncertain behavior of lymphoid, hematopoietic and related tissue: Secondary | ICD-10-CM | POA: Diagnosis not present

## 2015-03-03 LAB — CBC WITH DIFFERENTIAL/PLATELET
BASO%: 1.7 % (ref 0.0–2.0)
Basophils Absolute: 0.1 10*3/uL (ref 0.0–0.1)
EOS%: 1.7 % (ref 0.0–7.0)
Eosinophils Absolute: 0.1 10*3/uL (ref 0.0–0.5)
HCT: 31.6 % — ABNORMAL LOW (ref 38.4–49.9)
HGB: 9.5 g/dL — ABNORMAL LOW (ref 13.0–17.1)
LYMPH#: 0.8 10*3/uL — AB (ref 0.9–3.3)
LYMPH%: 26.2 % (ref 14.0–49.0)
MCH: 28.1 pg (ref 27.2–33.4)
MCHC: 30.1 g/dL — AB (ref 32.0–36.0)
MCV: 93.5 fL (ref 79.3–98.0)
MONO#: 0.2 10*3/uL (ref 0.1–0.9)
MONO%: 6.1 % (ref 0.0–14.0)
NEUT#: 1.9 10*3/uL (ref 1.5–6.5)
NEUT%: 64.3 % (ref 39.0–75.0)
Platelets: 123 10*3/uL — ABNORMAL LOW (ref 140–400)
RBC: 3.38 10*6/uL — ABNORMAL LOW (ref 4.20–5.82)
RDW: 26.2 % — ABNORMAL HIGH (ref 11.0–14.6)
WBC: 2.9 10*3/uL — ABNORMAL LOW (ref 4.0–10.3)
nRBC: 4 % — ABNORMAL HIGH (ref 0–0)

## 2015-03-03 LAB — TECHNOLOGIST REVIEW

## 2015-03-03 LAB — HOLD TUBE, BLOOD BANK

## 2015-03-03 NOTE — Progress Notes (Signed)
Patient scheduled for cbc, hold clot @ 1300 today, orders in epic,for c/o SOB on E. daughter lisa aware.

## 2015-03-03 NOTE — Telephone Encounter (Signed)
Called and left a message with lab appointment today per pof  anne

## 2015-03-03 NOTE — Telephone Encounter (Signed)
PT. IS SHORT OF BREATH WITH EXERTION. PT.'S DAUGHTER WOULD LIKE TO HAVE HER FATHER'S BLOOD CHECKED TODAY. THIS NOTE ROUTED TO DR.Baldwin [place].

## 2015-03-09 ENCOUNTER — Other Ambulatory Visit (HOSPITAL_BASED_OUTPATIENT_CLINIC_OR_DEPARTMENT_OTHER): Payer: Medicare Other

## 2015-03-09 ENCOUNTER — Ambulatory Visit: Payer: Medicare Other

## 2015-03-09 ENCOUNTER — Other Ambulatory Visit: Payer: Medicare Other

## 2015-03-09 DIAGNOSIS — D47Z9 Other specified neoplasms of uncertain behavior of lymphoid, hematopoietic and related tissue: Secondary | ICD-10-CM

## 2015-03-09 DIAGNOSIS — D508 Other iron deficiency anemias: Secondary | ICD-10-CM

## 2015-03-09 LAB — CBC WITH DIFFERENTIAL/PLATELET
BASO%: 1.6 % (ref 0.0–2.0)
Basophils Absolute: 0.1 10*3/uL (ref 0.0–0.1)
EOS ABS: 0.1 10*3/uL (ref 0.0–0.5)
EOS%: 2 % (ref 0.0–7.0)
HEMATOCRIT: 30.6 % — AB (ref 38.4–49.9)
HGB: 9 g/dL — ABNORMAL LOW (ref 13.0–17.1)
LYMPH#: 0.6 10*3/uL — AB (ref 0.9–3.3)
LYMPH%: 20.6 % (ref 14.0–49.0)
MCH: 27.8 pg (ref 27.2–33.4)
MCHC: 29.4 g/dL — ABNORMAL LOW (ref 32.0–36.0)
MCV: 94.4 fL (ref 79.3–98.0)
MONO#: 0.2 10*3/uL (ref 0.1–0.9)
MONO%: 6.2 % (ref 0.0–14.0)
NEUT%: 69.6 % (ref 39.0–75.0)
NEUTROS ABS: 2.1 10*3/uL (ref 1.5–6.5)
NRBC: 3 % — AB (ref 0–0)
Platelets: 119 10*3/uL — ABNORMAL LOW (ref 140–400)
RBC: 3.24 10*6/uL — ABNORMAL LOW (ref 4.20–5.82)
RDW: 26.3 % — ABNORMAL HIGH (ref 11.0–14.6)
WBC: 3.1 10*3/uL — AB (ref 4.0–10.3)

## 2015-03-09 LAB — TECHNOLOGIST REVIEW

## 2015-03-09 LAB — HOLD TUBE, BLOOD BANK

## 2015-03-23 ENCOUNTER — Ambulatory Visit: Payer: Medicare Other

## 2015-03-23 ENCOUNTER — Other Ambulatory Visit: Payer: Medicare Other

## 2015-03-23 ENCOUNTER — Other Ambulatory Visit (HOSPITAL_BASED_OUTPATIENT_CLINIC_OR_DEPARTMENT_OTHER): Payer: Medicare Other

## 2015-03-23 DIAGNOSIS — D649 Anemia, unspecified: Secondary | ICD-10-CM | POA: Diagnosis not present

## 2015-03-23 LAB — HOLD TUBE, BLOOD BANK

## 2015-03-23 LAB — CBC WITH DIFFERENTIAL/PLATELET
BASO%: 1.9 % (ref 0.0–2.0)
BASOS ABS: 0.1 10*3/uL (ref 0.0–0.1)
EOS%: 0.9 % (ref 0.0–7.0)
Eosinophils Absolute: 0 10*3/uL (ref 0.0–0.5)
HCT: 28.4 % — ABNORMAL LOW (ref 38.4–49.9)
HGB: 8.3 g/dL — ABNORMAL LOW (ref 13.0–17.1)
LYMPH%: 21.9 % (ref 14.0–49.0)
MCH: 27 pg — ABNORMAL LOW (ref 27.2–33.4)
MCHC: 29.2 g/dL — AB (ref 32.0–36.0)
MCV: 92.5 fL (ref 79.3–98.0)
MONO#: 0.2 10*3/uL (ref 0.1–0.9)
MONO%: 5.6 % (ref 0.0–14.0)
NEUT#: 2.2 10*3/uL (ref 1.5–6.5)
NEUT%: 69.7 % (ref 39.0–75.0)
PLATELETS: 134 10*3/uL — AB (ref 140–400)
RBC: 3.07 10*6/uL — AB (ref 4.20–5.82)
RDW: 27.1 % — ABNORMAL HIGH (ref 11.0–14.6)
WBC: 3.2 10*3/uL — ABNORMAL LOW (ref 4.0–10.3)
lymph#: 0.7 10*3/uL — ABNORMAL LOW (ref 0.9–3.3)
nRBC: 2 % — ABNORMAL HIGH (ref 0–0)

## 2015-03-23 LAB — TECHNOLOGIST REVIEW

## 2015-04-06 ENCOUNTER — Ambulatory Visit (HOSPITAL_BASED_OUTPATIENT_CLINIC_OR_DEPARTMENT_OTHER): Payer: Medicare Other

## 2015-04-06 ENCOUNTER — Other Ambulatory Visit (HOSPITAL_BASED_OUTPATIENT_CLINIC_OR_DEPARTMENT_OTHER): Payer: Medicare Other

## 2015-04-06 ENCOUNTER — Other Ambulatory Visit: Payer: Self-pay | Admitting: *Deleted

## 2015-04-06 ENCOUNTER — Ambulatory Visit (HOSPITAL_COMMUNITY)
Admission: RE | Admit: 2015-04-06 | Discharge: 2015-04-06 | Disposition: A | Discharge status: Home / Self Care | Payer: Medicare Other | Source: Ambulatory Visit | Attending: Oncology | Admitting: Oncology

## 2015-04-06 ENCOUNTER — Other Ambulatory Visit: Payer: Medicare Other

## 2015-04-06 ENCOUNTER — Ambulatory Visit: Payer: Medicare Other

## 2015-04-06 VITALS — BP 121/41 | HR 58 | Temp 98.0°F | Resp 17

## 2015-04-06 DIAGNOSIS — D61818 Other pancytopenia: Secondary | ICD-10-CM | POA: Insufficient documentation

## 2015-04-06 DIAGNOSIS — D649 Anemia, unspecified: Secondary | ICD-10-CM

## 2015-04-06 LAB — CBC WITH DIFFERENTIAL/PLATELET
BASO%: 1.9 % (ref 0.0–2.0)
Basophils Absolute: 0.1 10*3/uL (ref 0.0–0.1)
EOS%: 1.4 % (ref 0.0–7.0)
Eosinophils Absolute: 0 10*3/uL (ref 0.0–0.5)
HCT: 23.2 % — ABNORMAL LOW (ref 38.4–49.9)
HGB: 7.1 g/dL — ABNORMAL LOW (ref 13.0–17.1)
LYMPH#: 0.8 10*3/uL — AB (ref 0.9–3.3)
LYMPH%: 26.3 % (ref 14.0–49.0)
MCH: 27.5 pg (ref 27.2–33.4)
MCHC: 30.6 g/dL — AB (ref 32.0–36.0)
MCV: 89.9 fL (ref 79.3–98.0)
MONO#: 0.3 10*3/uL (ref 0.1–0.9)
MONO%: 8.9 % (ref 0.0–14.0)
NEUT#: 1.9 10*3/uL (ref 1.5–6.5)
NEUT%: 61.5 % (ref 39.0–75.0)
Platelets: 151 10*3/uL (ref 140–400)
RBC: 2.58 10*6/uL — ABNORMAL LOW (ref 4.20–5.82)
RDW: 29.7 % — AB (ref 11.0–14.6)
WBC: 3.1 10*3/uL — ABNORMAL LOW (ref 4.0–10.3)

## 2015-04-06 LAB — HOLD TUBE, BLOOD BANK

## 2015-04-06 LAB — TECHNOLOGIST REVIEW

## 2015-04-06 LAB — PREPARE RBC (CROSSMATCH)

## 2015-04-06 MED ORDER — DIPHENHYDRAMINE HCL 25 MG PO CAPS
25.0000 mg | ORAL_CAPSULE | Freq: Once | ORAL | Status: AC
  Administered 2015-04-06: 25 mg via ORAL

## 2015-04-06 MED ORDER — SODIUM CHLORIDE 0.9 % IV SOLN
250.0000 mL | Freq: Once | INTRAVENOUS | Status: AC
  Administered 2015-04-06: 250 mL via INTRAVENOUS

## 2015-04-06 MED ORDER — ACETAMINOPHEN 325 MG PO TABS
ORAL_TABLET | ORAL | Status: AC
  Filled 2015-04-06: qty 2

## 2015-04-06 MED ORDER — DIPHENHYDRAMINE HCL 25 MG PO CAPS
ORAL_CAPSULE | ORAL | Status: AC
  Filled 2015-04-06: qty 1

## 2015-04-06 MED ORDER — ACETAMINOPHEN 325 MG PO TABS
650.0000 mg | ORAL_TABLET | Freq: Once | ORAL | Status: AC
  Administered 2015-04-06: 650 mg via ORAL

## 2015-04-06 NOTE — Patient Instructions (Signed)

## 2015-04-07 LAB — TYPE AND SCREEN
ABO/RH(D): O POS
ANTIBODY SCREEN: NEGATIVE
UNIT DIVISION: 0
Unit division: 0

## 2015-04-20 ENCOUNTER — Ambulatory Visit (HOSPITAL_COMMUNITY)
Admission: RE | Admit: 2015-04-20 | Discharge: 2015-04-20 | Disposition: A | Discharge status: Home / Self Care | Payer: Medicare Other | Source: Ambulatory Visit | Attending: Oncology | Admitting: Oncology

## 2015-04-20 ENCOUNTER — Ambulatory Visit (HOSPITAL_BASED_OUTPATIENT_CLINIC_OR_DEPARTMENT_OTHER): Payer: Medicare Other | Admitting: Oncology

## 2015-04-20 ENCOUNTER — Other Ambulatory Visit (HOSPITAL_BASED_OUTPATIENT_CLINIC_OR_DEPARTMENT_OTHER): Payer: Medicare Other

## 2015-04-20 ENCOUNTER — Other Ambulatory Visit: Payer: Medicare Other

## 2015-04-20 ENCOUNTER — Ambulatory Visit (HOSPITAL_BASED_OUTPATIENT_CLINIC_OR_DEPARTMENT_OTHER): Payer: Medicare Other

## 2015-04-20 ENCOUNTER — Telehealth: Payer: Self-pay | Admitting: Oncology

## 2015-04-20 ENCOUNTER — Ambulatory Visit: Payer: Medicare Other

## 2015-04-20 VITALS — BP 144/41 | HR 79 | Temp 97.6°F | Resp 18 | Ht 69.0 in | Wt 163.3 lb

## 2015-04-20 DIAGNOSIS — D471 Chronic myeloproliferative disease: Secondary | ICD-10-CM

## 2015-04-20 DIAGNOSIS — D6489 Other specified anemias: Secondary | ICD-10-CM

## 2015-04-20 DIAGNOSIS — D709 Neutropenia, unspecified: Secondary | ICD-10-CM

## 2015-04-20 DIAGNOSIS — D696 Thrombocytopenia, unspecified: Secondary | ICD-10-CM | POA: Diagnosis not present

## 2015-04-20 DIAGNOSIS — D508 Other iron deficiency anemias: Secondary | ICD-10-CM

## 2015-04-20 DIAGNOSIS — D631 Anemia in chronic kidney disease: Secondary | ICD-10-CM

## 2015-04-20 DIAGNOSIS — N189 Chronic kidney disease, unspecified: Principal | ICD-10-CM

## 2015-04-20 LAB — CBC WITH DIFFERENTIAL/PLATELET
BASO%: 1.4 % (ref 0.0–2.0)
BASOS ABS: 0.1 10*3/uL (ref 0.0–0.1)
EOS ABS: 0.1 10*3/uL (ref 0.0–0.5)
EOS%: 1.4 % (ref 0.0–7.0)
HEMATOCRIT: 29 % — AB (ref 38.4–49.9)
HGB: 8.5 g/dL — ABNORMAL LOW (ref 13.0–17.1)
LYMPH#: 0.8 10*3/uL — AB (ref 0.9–3.3)
LYMPH%: 21.3 % (ref 14.0–49.0)
MCH: 27 pg — AB (ref 27.2–33.4)
MCHC: 29.3 g/dL — ABNORMAL LOW (ref 32.0–36.0)
MCV: 92.1 fL (ref 79.3–98.0)
MONO#: 0.3 10*3/uL (ref 0.1–0.9)
MONO%: 7.8 % (ref 0.0–14.0)
NEUT%: 68.1 % (ref 39.0–75.0)
NEUTROS ABS: 2.4 10*3/uL (ref 1.5–6.5)
NRBC: 2 % — AB (ref 0–0)
Platelets: 115 10*3/uL — ABNORMAL LOW (ref 140–400)
RBC: 3.15 10*6/uL — ABNORMAL LOW (ref 4.20–5.82)
RDW: 26.5 % — AB (ref 11.0–14.6)
WBC: 3.6 10*3/uL — AB (ref 4.0–10.3)

## 2015-04-20 LAB — HOLD TUBE, BLOOD BANK

## 2015-04-20 LAB — TECHNOLOGIST REVIEW: Technologist Review: 3

## 2015-04-20 MED ORDER — DARBEPOETIN ALFA 300 MCG/0.6ML IJ SOSY
300.0000 ug | PREFILLED_SYRINGE | Freq: Once | INTRAMUSCULAR | Status: AC
Start: 2015-04-20 — End: 2015-04-20
  Administered 2015-04-20: 300 ug via SUBCUTANEOUS
  Filled 2015-04-20: qty 0.6

## 2015-04-20 NOTE — Telephone Encounter (Signed)
Gave and printed appt sched and avs for pt for June and July  °

## 2015-04-20 NOTE — Progress Notes (Signed)
Hematology and Oncology Follow Up Visit  Kyle Heath [id number] Mar 10, 1927 81 y.o. 04/20/2015 9:50 AM    Principle Diagnosis: 81 year old gentleman with diagnosis of pancytopenia  due to myelofibrosis. He he might have an element of myelodysplastic syndrome as well. Bone marrow biopsy in 01/2012 confirmed the diagnosis.   Current therapy:  Supportive transfusions every 2 weeks as needed and Aransep 300 mcg every two weeks.   Interim History:  Kyle Heath presents today for a routine visit with his daughter. Since the last visit, he reports no new complaints. He has not reported any complications related to transfusions or Aranesp. He continues to be transfusion dependent but notes some increased shortness of breath with exertion lately. He continues to have reasonable quality of life and ambulates without any difficulties. He has not had any syncope or seizures. His appetite is reasonable although he does not eat regularly. His weight is relatively stable. He has not reported any chest pain. His level of fatigue remains at about baseline to slightly increased.  He is not reporting any recent hospitalization or emesis. Is not reporting any recent bleeding or infections. He has not reported any hematochezia or melena. Has not reported any frequency urgency or hesitancy. Has not reported any lymphadenopathy or rash. Has not reported any constitutional symptoms weight loss or appetite changes. His quality of life remains about the same. Remainder of his review of systems is unremarkable.   Medications: I have reviewed the patient's current medications. Current Outpatient Prescriptions  Medication Sig Dispense Refill  . aspirin 81 MG tablet Take 81 mg by mouth daily after breakfast.     . carvedilol (COREG) 25 MG tablet Take 25 mg by mouth daily.  0  . febuxostat (ULORIC) 40 MG tablet Take 2 tablets (80 mg total) by mouth daily. 60 tablet 0  . HYDROcodone-acetaminophen (NORCO/VICODIN) 5-325 MG per tablet  Take 1-2 tablets by mouth every 6 (six) hours as needed for moderate pain. 20 tablet 0  . levothyroxine (SYNTHROID, LEVOTHROID) 88 MCG tablet Take 88 mcg by mouth daily before breakfast.    . Multiple Vitamin (MULTIVITAMIN WITH MINERALS) TABS Take 1 tablet by mouth daily.    . potassium chloride SA (K-DUR,KLOR-CON) 20 MEQ tablet Take 1 tablet (20 mEq total) by mouth daily. 30 tablet 0  . TAZTIA XT 360 MG 24 hr capsule Take 360 mg by mouth daily.     No current facility-administered medications for this visit.   Facility-Administered Medications Ordered in Other Visits  Medication Dose Route Frequency Provider Last Rate Last Dose  . furosemide (LASIX) injection 20 mg  20 mg Intravenous Once [name], NP   Stopped at 02/13/13 1623     Allergies: No Known Allergies  Past Medical History, Surgical history, Social history, and Family History were reviewed and updated.   Physical Exam: Blood pressure 144/41, pulse 79, temperature 97.6 F (36.4 C), temperature source Oral, resp. rate 18, height 5' 9"  (1.753 m), weight 163 lb 4.8 oz (74.072 kg), SpO2 99 %.  ECOG: 1  General appearance: alert. Not in any distress. Head: Normocephalic, without obvious abnormality Neck: no adenopathy Lymph nodes: Cervical, supraclavicular, and axillary nodes normal. Heart:regular rate and rhythm, S1, S2. Lung:chest clear, no wheezing, rales, normal symmetric air entry Abdomen: soft, non-tender, without masses or organomegaly EXT:no erythema, induration, or nodules. Trace edema noted in his lower extremities bilaterally.  Lab Results:  CBC    Component Value Date/Time   WBC 3.6* 04/20/2015 0919   WBC 2.3*  01/21/2013 1431   RBC 3.15* 04/20/2015 0919   RBC 3.12* 01/21/2013 1431   HGB 8.5* 04/20/2015 0919   HGB 9.0* 01/21/2013 1431   HCT 29.0* 04/20/2015 0919   HCT 27.4* 01/21/2013 1431   PLT 115* 04/20/2015 0919   PLT 217 01/21/2013 1431   MCV 92.1 04/20/2015 0919   MCV 87.8 01/21/2013  1431   MCH 27.0* 04/20/2015 0919   MCH 28.8 01/21/2013 1431   MCHC 29.3* 04/20/2015 0919   MCHC 32.8 01/21/2013 1431   RDW 26.5* 04/20/2015 0919   RDW 20.3* 01/21/2013 1431   LYMPHSABS 0.8* 04/20/2015 0919   MONOABS 0.3 04/20/2015 0919   EOSABS 0.1 04/20/2015 0919   BASOSABS 0.1 04/20/2015 0919      Impression and Plan:  81 year old gentleman with the following issues:  1. Anemia as a part of myeloproliferative disorder likely myelofibrosis. He could also have an element of myelodysplastic syndrome as well. He is currently on supportive care only. He does not have abdominal pain or a large spleen. His hemoglobin is 8.5 today and he is asymptomatic. He will not require transfusion today but will receive Aranesp injection.  I will obtain iron studies as well as liver function test with the next visit to screen for end organ damage related to iron overload.  2. Neutropenia: Stable he has not had any recurrent infections. No need for growth factor support.  3. Thrombocytopenia : Stable. No active bleeding. No transfusion indicated. His platelet count has been relatively stable.  4. HTN: On Cardizem and Cozaar per PCP.  5. IV Access: Poor peripheral IV access over the patient prefers to not pursue a Port-A-Cath at this time.   6. Followup: Every 2 weeks for lab, injection, and possible transfusion. He'll have a clinical visits in 2 months.  [facility], MD 6/1/20169:50 AM

## 2015-05-03 ENCOUNTER — Telehealth: Payer: Self-pay | Admitting: Nurse Practitioner

## 2015-05-03 ENCOUNTER — Other Ambulatory Visit: Payer: Self-pay | Admitting: *Deleted

## 2015-05-03 NOTE — Telephone Encounter (Signed)
Called and left a message with lab appointment for 6/15

## 2015-05-04 ENCOUNTER — Other Ambulatory Visit: Payer: Medicare Other

## 2015-05-04 ENCOUNTER — Other Ambulatory Visit (HOSPITAL_BASED_OUTPATIENT_CLINIC_OR_DEPARTMENT_OTHER): Payer: Medicare Other

## 2015-05-04 ENCOUNTER — Ambulatory Visit: Payer: Medicare Other | Admitting: Physician Assistant

## 2015-05-04 ENCOUNTER — Ambulatory Visit (HOSPITAL_BASED_OUTPATIENT_CLINIC_OR_DEPARTMENT_OTHER): Payer: Medicare Other

## 2015-05-04 ENCOUNTER — Encounter: Payer: Self-pay | Admitting: Nurse Practitioner

## 2015-05-04 ENCOUNTER — Ambulatory Visit (HOSPITAL_BASED_OUTPATIENT_CLINIC_OR_DEPARTMENT_OTHER): Payer: Medicare Other | Admitting: Nurse Practitioner

## 2015-05-04 ENCOUNTER — Ambulatory Visit: Payer: Medicare Other

## 2015-05-04 ENCOUNTER — Telehealth: Payer: Self-pay | Admitting: Nurse Practitioner

## 2015-05-04 VITALS — BP 136/65 | HR 90 | Temp 98.0°F | Resp 18 | Ht 69.0 in | Wt 160.6 lb

## 2015-05-04 DIAGNOSIS — D471 Chronic myeloproliferative disease: Secondary | ICD-10-CM

## 2015-05-04 DIAGNOSIS — C946 Myelodysplastic disease, not classified: Secondary | ICD-10-CM

## 2015-05-04 DIAGNOSIS — D7581 Myelofibrosis: Secondary | ICD-10-CM | POA: Diagnosis not present

## 2015-05-04 DIAGNOSIS — I10 Essential (primary) hypertension: Secondary | ICD-10-CM

## 2015-05-04 DIAGNOSIS — D6489 Other specified anemias: Secondary | ICD-10-CM

## 2015-05-04 DIAGNOSIS — D61818 Other pancytopenia: Secondary | ICD-10-CM

## 2015-05-04 DIAGNOSIS — D696 Thrombocytopenia, unspecified: Secondary | ICD-10-CM

## 2015-05-04 DIAGNOSIS — N189 Chronic kidney disease, unspecified: Secondary | ICD-10-CM

## 2015-05-04 DIAGNOSIS — D649 Anemia, unspecified: Secondary | ICD-10-CM

## 2015-05-04 DIAGNOSIS — D709 Neutropenia, unspecified: Secondary | ICD-10-CM

## 2015-05-04 DIAGNOSIS — D631 Anemia in chronic kidney disease: Secondary | ICD-10-CM | POA: Diagnosis not present

## 2015-05-04 LAB — MANUAL DIFFERENTIAL
ALC: 0.5 10*3/uL — AB (ref 0.9–3.3)
ANC (CHCC manual diff): 2.7 10*3/uL (ref 1.5–6.5)
BAND NEUTROPHILS: 0 % (ref 0–10)
BLASTS: 5 % — AB (ref 0–0)
Basophil: 2 % (ref 0–2)
EOS%: 3 % (ref 0–7)
LYMPH: 13 % — ABNORMAL LOW (ref 14–49)
MONO: 1 % (ref 0–14)
Metamyelocytes: 0 % (ref 0–0)
Myelocytes: 0 % (ref 0–0)
NRBC: 7 % — AB (ref 0–0)
OTHER CELL: 0 % (ref 0–0)
PLT EST: DECREASED
PROMYELO: 0 % (ref 0–0)
SEG: 76 % (ref 38–77)
Variant Lymph: 0 % (ref 0–0)

## 2015-05-04 LAB — CBC WITH DIFFERENTIAL/PLATELET
HEMATOCRIT: 29.2 % — AB (ref 38.4–49.9)
HGB: 8.3 g/dL — ABNORMAL LOW (ref 13.0–17.1)
MCH: 26.3 pg — AB (ref 27.2–33.4)
MCHC: 28.4 g/dL — AB (ref 32.0–36.0)
MCV: 92.4 fL (ref 79.3–98.0)
PLATELETS: 126 10*3/uL — AB (ref 140–400)
RBC: 3.16 10*6/uL — ABNORMAL LOW (ref 4.20–5.82)
RDW: 29.1 % — ABNORMAL HIGH (ref 11.0–14.6)
WBC: 3.6 10*3/uL — ABNORMAL LOW (ref 4.0–10.3)

## 2015-05-04 LAB — COMPREHENSIVE METABOLIC PANEL (CC13)
ALBUMIN: 3.1 g/dL — AB (ref 3.5–5.0)
ALK PHOS: 79 U/L (ref 40–150)
ALT: 29 U/L (ref 0–55)
AST: 21 U/L (ref 5–34)
Anion Gap: 12 mEq/L — ABNORMAL HIGH (ref 3–11)
BILIRUBIN TOTAL: 0.59 mg/dL (ref 0.20–1.20)
BUN: 17.9 mg/dL (ref 7.0–26.0)
CHLORIDE: 105 meq/L (ref 98–109)
CO2: 23 mEq/L (ref 22–29)
CREATININE: 1.2 mg/dL (ref 0.7–1.3)
Calcium: 8.4 mg/dL (ref 8.4–10.4)
EGFR: 65 mL/min/{1.73_m2} — ABNORMAL LOW (ref >90)
Glucose: 140 mg/dl (ref 70–140)
Potassium: 4 mEq/L (ref 3.5–5.1)
Sodium: 139 mEq/L (ref 136–145)
Total Protein: 6.8 g/dL (ref 6.4–8.3)

## 2015-05-04 LAB — IRON AND TIBC CHCC
%SAT: 87 % — AB (ref 20–55)
Iron: 108 ug/dL (ref 42–163)
TIBC: 124 ug/dL — ABNORMAL LOW (ref 202–409)
UIBC: 16 ug/dL — AB (ref 117–376)

## 2015-05-04 LAB — HOLD TUBE, BLOOD BANK

## 2015-05-04 LAB — FERRITIN CHCC: Ferritin: 3454 ng/ml — ABNORMAL HIGH (ref 22–316)

## 2015-05-04 MED ORDER — DARBEPOETIN ALFA 300 MCG/0.6ML IJ SOSY
300.0000 ug | PREFILLED_SYRINGE | Freq: Once | INTRAMUSCULAR | Status: AC
  Administered 2015-05-04: 300 ug via SUBCUTANEOUS
  Filled 2015-05-04: qty 0.6

## 2015-05-04 NOTE — Telephone Encounter (Signed)
Gave avs & calendar for June thru August

## 2015-05-04 NOTE — Progress Notes (Signed)
Hematology and Oncology Follow Up Visit  Tanveer Brammer [id number] [birth year] 81 y.o. 05/04/2015 10:13 AM    Principle Diagnosis: 81 year old gentleman with diagnosis of pancytopenia  due to myelofibrosis. He he might have an element of myelodysplastic syndrome as well. Bone marrow biopsy in 01/2012 confirmed the diagnosis.   Current therapy:  Supportive transfusions every 2 weeks as needed and Aransep 300 mcg every two weeks.   Interim History:  Mr. Mickley presents today for a routine visit with his daughter. He just saw Dr. [name] 2 weeks ago and iron studies were added to his lab work today to check for overload. he does not have any symptoms of that at this time. He has no abdominal pain or joint aches. He has not reported any complications related to transfusions or Aranesp. He continues to be transfusion dependent but notes some increased shortness of breath with exertion lately. He continues to have reasonable quality of life and ambulates without any difficulties. He has not had any syncope or seizures. His appetite is reasonable although he does not eat regularly. His weight is relatively stable. He has not reported any chest pain. His level of fatigue remains at about baseline to slightly increased.  He is not reporting any recent hospitalization or emesis. Is not reporting any recent bleeding or infections. He has not reported any hematochezia or melena. Has not reported any frequency urgency or hesitancy. Has not reported any lymphadenopathy or rash. Has not reported any constitutional symptoms weight loss or appetite changes. His quality of life remains about the same. Remainder of his review of systems is unremarkable.   Medications: I have reviewed the patient's current medications. Current Outpatient Prescriptions  Medication Sig Dispense Refill  . aspirin 81 MG tablet Take 81 mg by mouth daily after breakfast.     . carvedilol (COREG) 25 MG tablet Take 25 mg by mouth daily.  0  .  furosemide (LASIX) 20 MG tablet Take 20 mg by mouth daily.    [name] Kitchen levothyroxine (SYNTHROID, LEVOTHROID) 88 MCG tablet Take 88 mcg by mouth daily after breakfast.     . Multiple Vitamin (MULTIVITAMIN WITH MINERALS) TABS Take 1 tablet by mouth daily.    . potassium chloride SA (K-DUR,KLOR-CON) 20 MEQ tablet Take 1 tablet (20 mEq total) by mouth daily. 30 tablet 0  . TAZTIA XT 360 MG 24 hr capsule Take 360 mg by mouth daily.    [name] Kitchen ULORIC 80 MG TABS Take 1 tablet by mouth daily.   0  . HYDROcodone-acetaminophen (NORCO/VICODIN) 5-325 MG per tablet Take 1-2 tablets by mouth every 6 (six) hours as needed for moderate pain. (Patient not taking: Reported on 05/04/2015) 20 tablet 0   No current facility-administered medications for this visit.   Facility-Administered Medications Ordered in Other Visits  Medication Dose Route Frequency Provider Last Rate Last Dose  . furosemide (LASIX) injection 20 mg  20 mg Intravenous Once [name], NP   Stopped at 02/13/13 1623     Allergies: No Known Allergies  Past Medical History, Surgical history, Social history, and Family History were reviewed and updated.   Physical Exam: Blood pressure 136/65, pulse 90, temperature 98 F (36.7 C), temperature source Oral, resp. rate 18, height _0  (1.753 m), weight 160 lb 9.6 oz (72.848 kg).  ECOG: 1  General appearance: alert. Not in any distress. Head: Normocephalic, without obvious abnormality Neck: no adenopathy Lymph nodes: Cervical, supraclavicular, and axillary nodes normal. Heart:regular rate and rhythm, S1, S2. Lung:chest clear, no  wheezing, rales, normal symmetric air entry Abdomen: soft, non-tender, without masses or organomegaly EXT:no erythema, induration, or nodules. Trace edema noted in his lower extremities bilaterally.  Lab Results:  CBC    Component Value Date/Time   WBC 3.6* 05/04/2015 0904   WBC 2.3* 01/21/2013 1431   RBC 3.16* 05/04/2015 0904   RBC 3.12* 01/21/2013 1431   HGB  8.3* 05/04/2015 0904   HGB 9.0* 01/21/2013 1431   HCT 29.2* 05/04/2015 0904   HCT 27.4* 01/21/2013 1431   PLT 126* 05/04/2015 0904   PLT 217 01/21/2013 1431   MCV 92.4 05/04/2015 0904   MCV 87.8 01/21/2013 1431   MCH 26.3* 05/04/2015 0904   MCH 28.8 01/21/2013 1431   MCHC 28.4* 05/04/2015 0904   MCHC 32.8 01/21/2013 1431   RDW 29.1* 05/04/2015 0904   RDW 20.3* 01/21/2013 1431   LYMPHSABS 0.8* 04/20/2015 0919   MONOABS 0.3 04/20/2015 0919   EOSABS 0.1 04/20/2015 0919   BASOSABS 0.1 04/20/2015 0919      Impression and Plan:  81 year old gentleman with the following issues:  1. Anemia as a part of myeloproliferative disorder likely myelofibrosis. He could also have an element of myelodysplastic syndrome as well. He is currently on supportive care only. He does not have abdominal pain or a large spleen. His hemoglobin is 8.3 today and he is asymptomatic. He will not require transfusion today but will receive Aranesp injection.  The iron studies drawn to screen for end organ damage related to iron overload have not returned yet, but his liver function tests were all normal.   2. Neutropenia: Stable he has not had any recurrent infections. No need for growth factor support.  3. Thrombocytopenia : Stable. No active bleeding. No transfusion indicated. His platelet count has been relatively stable.  4. HTN: On Cardizem and Cozaar per PCP.  5. IV Access: Poor peripheral IV access over the patient prefers to not pursue a Port-A-Cath at this time.   6. Followup: Every 2 weeks for lab, injection, and possible transfusion. He'll have a clinical visits in 2 months.  Laurie Panda, NP 6/15/201610:13 AM

## 2015-05-18 ENCOUNTER — Ambulatory Visit (HOSPITAL_BASED_OUTPATIENT_CLINIC_OR_DEPARTMENT_OTHER): Payer: Medicare Other

## 2015-05-18 ENCOUNTER — Other Ambulatory Visit (HOSPITAL_BASED_OUTPATIENT_CLINIC_OR_DEPARTMENT_OTHER): Payer: Medicare Other

## 2015-05-18 VITALS — BP 132/52 | HR 73 | Temp 97.9°F | Resp 18

## 2015-05-18 DIAGNOSIS — D649 Anemia, unspecified: Secondary | ICD-10-CM | POA: Diagnosis not present

## 2015-05-18 DIAGNOSIS — D631 Anemia in chronic kidney disease: Secondary | ICD-10-CM | POA: Diagnosis not present

## 2015-05-18 DIAGNOSIS — D6489 Other specified anemias: Secondary | ICD-10-CM

## 2015-05-18 DIAGNOSIS — N189 Chronic kidney disease, unspecified: Secondary | ICD-10-CM | POA: Diagnosis not present

## 2015-05-18 LAB — CBC WITH DIFFERENTIAL/PLATELET
BASO%: 2.6 % — ABNORMAL HIGH (ref 0.0–2.0)
Basophils Absolute: 0.1 10*3/uL (ref 0.0–0.1)
EOS%: 1.4 % (ref 0.0–7.0)
Eosinophils Absolute: 0.1 10*3/uL (ref 0.0–0.5)
HCT: 28.1 % — ABNORMAL LOW (ref 38.4–49.9)
HGB: 8.7 g/dL — ABNORMAL LOW (ref 13.0–17.1)
LYMPH%: 18.4 % (ref 14.0–49.0)
MCH: 27.6 pg (ref 27.2–33.4)
MCHC: 30.9 g/dL — ABNORMAL LOW (ref 32.0–36.0)
MCV: 89.4 fL (ref 79.3–98.0)
MONO#: 0 10*3/uL — AB (ref 0.1–0.9)
MONO%: 1.1 % (ref 0.0–14.0)
NEUT%: 76.5 % — AB (ref 39.0–75.0)
NEUTROS ABS: 3.2 10*3/uL (ref 1.5–6.5)
PLATELETS: 107 10*3/uL — AB (ref 140–400)
RBC: 3.15 10*6/uL — AB (ref 4.20–5.82)
RDW: 34.5 % — ABNORMAL HIGH (ref 11.0–14.6)
WBC: 4.2 10*3/uL (ref 4.0–10.3)
lymph#: 0.8 10*3/uL — ABNORMAL LOW (ref 0.9–3.3)

## 2015-05-18 LAB — HOLD TUBE, BLOOD BANK

## 2015-05-18 LAB — TECHNOLOGIST REVIEW: Technologist Review: 6

## 2015-05-18 MED ORDER — DARBEPOETIN ALFA 300 MCG/0.6ML IJ SOSY
300.0000 ug | PREFILLED_SYRINGE | Freq: Once | INTRAMUSCULAR | Status: AC
  Administered 2015-05-18: 300 ug via SUBCUTANEOUS
  Filled 2015-05-18: qty 0.6

## 2015-05-18 NOTE — Progress Notes (Signed)
Aranesp given.

## 2015-05-18 NOTE — Patient Instructions (Signed)
Darbepoetin Alfa injection What is this medicine? DARBEPOETIN ALFA (dar be POE e tin AL fa) helps your body make more red blood cells. It is used to treat anemia caused by chronic kidney failure and chemotherapy. This medicine may be used for other purposes; ask your health care provider or pharmacist if you have questions. COMMON BRAND NAME(S): Aranesp What should I tell my health care provider before I take this medicine? They need to know if you have any of these conditions: -blood clotting disorders or history of blood clots -cancer patient not on chemotherapy -cystic fibrosis -heart disease, such as angina, heart failure, or a history of a heart attack -hemoglobin level of 12 g/dL or greater -high blood pressure -low levels of folate, iron, or vitamin B12 -seizures -an unusual or allergic reaction to darbepoetin, erythropoietin, albumin, hamster proteins, latex, other medicines, foods, dyes, or preservatives -pregnant or trying to get pregnant -breast-feeding How should I use this medicine? This medicine is for injection into a vein or under the skin. It is usually given by a health care professional in a hospital or clinic setting. If you get this medicine at home, you will be taught how to prepare and give this medicine. Do not shake the solution before you withdraw a dose. Use exactly as directed. Take your medicine at regular intervals. Do not take your medicine more often than directed. It is important that you put your used needles and syringes in a special sharps container. Do not put them in a trash can. If you do not have a sharps container, call your pharmacist or healthcare provider to get one. Talk to your pediatrician regarding the use of this medicine in children. While this medicine may be used in children as young as 1 year for selected conditions, precautions do apply. Overdosage: If you think you have taken too much of this medicine contact a poison control center or  emergency room at once. NOTE: This medicine is only for you. Do not share this medicine with others. What if I miss a dose? If you miss a dose, take it as soon as you can. If it is almost time for your next dose, take only that dose. Do not take double or extra doses. What may interact with this medicine? Do not take this medicine with any of the following medications: -epoetin alfa This list may not describe all possible interactions. Give your health care provider a list of all the medicines, herbs, non-prescription drugs, or dietary supplements you use. Also tell them if you smoke, drink alcohol, or use illegal drugs. Some items may interact with your medicine. What should I watch for while using this medicine? Visit your prescriber or health care professional for regular checks on your progress and for the needed blood tests and blood pressure measurements. It is especially important for the doctor to make sure your hemoglobin level is in the desired range, to limit the risk of potential side effects and to give you the best benefit. Keep all appointments for any recommended tests. Check your blood pressure as directed. Ask your doctor what your blood pressure should be and when you should contact him or her. As your body makes more red blood cells, you may need to take iron, folic acid, or vitamin B supplements. Ask your doctor or health care provider which products are right for you. If you have kidney disease continue dietary restrictions, even though this medication can make you feel better. Talk with your doctor or health   care professional about the foods you eat and the vitamins that you take. What side effects may I notice from receiving this medicine? Side effects that you should report to your doctor or health care professional as soon as possible: -allergic reactions like skin rash, itching or hives, swelling of the face, lips, or tongue -breathing problems -changes in vision -chest  pain -confusion, trouble speaking or understanding -feeling faint or lightheaded, falls -high blood pressure -muscle aches or pains -pain, swelling, warmth in the leg -rapid weight gain -severe headaches -sudden numbness or weakness of the face, arm or leg -trouble walking, dizziness, loss of balance or coordination -seizures (convulsions) -swelling of the ankles, feet, hands -unusually weak or tired Side effects that usually do not require medical attention (report to your doctor or health care professional if they continue or are bothersome): -diarrhea -fever, chills (flu-like symptoms) -headaches -nausea, vomiting -redness, stinging, or swelling at site where injected This list may not describe all possible side effects. Call your doctor for medical advice about side effects. You may report side effects to FDA at 1-800-FDA-1088. Where should I keep my medicine? Keep out of the reach of children. Store in a refrigerator between 2 and 8 degrees C (36 and 46 degrees F). Do not freeze. Do not shake. Throw away any unused portion if using a single-dose vial. Throw away any unused medicine after the expiration date. NOTE: This sheet is a summary. It may not cover all possible information. If you have questions about this medicine, talk to your doctor, pharmacist, or health care provider.  2015, [employer]. (2008-10-19 10:23:57)  

## 2015-05-19 ENCOUNTER — Telehealth: Payer: Self-pay | Admitting: Oncology

## 2015-05-19 NOTE — Telephone Encounter (Signed)
Due to PAL moved 8/10 appointments to 8/11. Left message for patient and mailed schedule. Other appointments remain the same.

## 2015-05-20 ENCOUNTER — Ambulatory Visit (HOSPITAL_COMMUNITY)
Admission: RE | Admit: 2015-05-20 | Discharge: 2015-05-20 | Disposition: A | Discharge status: Home / Self Care | Payer: Medicare Other | Source: Ambulatory Visit | Attending: Oncology | Admitting: Oncology

## 2015-05-20 DIAGNOSIS — D471 Chronic myeloproliferative disease: Secondary | ICD-10-CM

## 2015-05-20 DIAGNOSIS — C946 Myelodysplastic disease, not classified: Secondary | ICD-10-CM | POA: Insufficient documentation

## 2015-06-01 ENCOUNTER — Ambulatory Visit (HOSPITAL_BASED_OUTPATIENT_CLINIC_OR_DEPARTMENT_OTHER): Payer: Medicare Other

## 2015-06-01 ENCOUNTER — Other Ambulatory Visit: Payer: Self-pay | Admitting: Physician Assistant

## 2015-06-01 ENCOUNTER — Other Ambulatory Visit: Payer: Self-pay | Admitting: *Deleted

## 2015-06-01 ENCOUNTER — Other Ambulatory Visit (HOSPITAL_BASED_OUTPATIENT_CLINIC_OR_DEPARTMENT_OTHER): Payer: Medicare Other

## 2015-06-01 VITALS — BP 146/48 | HR 70 | Temp 97.8°F | Resp 18

## 2015-06-01 DIAGNOSIS — D631 Anemia in chronic kidney disease: Secondary | ICD-10-CM | POA: Diagnosis not present

## 2015-06-01 DIAGNOSIS — N189 Chronic kidney disease, unspecified: Secondary | ICD-10-CM

## 2015-06-01 DIAGNOSIS — C946 Myelodysplastic disease, not classified: Secondary | ICD-10-CM | POA: Diagnosis not present

## 2015-06-01 DIAGNOSIS — D649 Anemia, unspecified: Secondary | ICD-10-CM

## 2015-06-01 DIAGNOSIS — D471 Chronic myeloproliferative disease: Secondary | ICD-10-CM

## 2015-06-01 LAB — CBC WITH DIFFERENTIAL/PLATELET
BASO%: 1 % (ref 0.0–2.0)
Basophils Absolute: 0 10*3/uL (ref 0.0–0.1)
EOS ABS: 0 10*3/uL (ref 0.0–0.5)
EOS%: 1 % (ref 0.0–7.0)
HEMATOCRIT: 28.3 % — AB (ref 38.4–49.9)
HGB: 7.9 g/dL — ABNORMAL LOW (ref 13.0–17.1)
LYMPH%: 23.5 % (ref 14.0–49.0)
MCH: 26.2 pg — ABNORMAL LOW (ref 27.2–33.4)
MCHC: 27.9 g/dL — AB (ref 32.0–36.0)
MCV: 94 fL (ref 79.3–98.0)
MONO#: 0.2 10*3/uL (ref 0.1–0.9)
MONO%: 7.1 % (ref 0.0–14.0)
NEUT#: 2.1 10*3/uL (ref 1.5–6.5)
NEUT%: 67.4 % (ref 39.0–75.0)
NRBC: 5 % — AB (ref 0–0)
PLATELETS: 109 10*3/uL — AB (ref 140–400)
RBC: 3.01 10*6/uL — AB (ref 4.20–5.82)
RDW: 31.6 % — ABNORMAL HIGH (ref 11.0–14.6)
WBC: 3.1 10*3/uL — ABNORMAL LOW (ref 4.0–10.3)
lymph#: 0.7 10*3/uL — ABNORMAL LOW (ref 0.9–3.3)

## 2015-06-01 LAB — HOLD TUBE, BLOOD BANK

## 2015-06-01 LAB — PREPARE RBC (CROSSMATCH)

## 2015-06-01 LAB — TECHNOLOGIST REVIEW: Technologist Review: 1

## 2015-06-01 MED ORDER — SODIUM CHLORIDE 0.9 % IJ SOLN
10.0000 mL | INTRAMUSCULAR | Status: DC | PRN
  Filled 2015-06-01: qty 10

## 2015-06-01 MED ORDER — SODIUM CHLORIDE 0.9 % IV SOLN
250.0000 mL | Freq: Once | INTRAVENOUS | Status: AC
  Administered 2015-06-01: 250 mL via INTRAVENOUS

## 2015-06-01 MED ORDER — ACETAMINOPHEN 325 MG PO TABS
ORAL_TABLET | ORAL | Status: AC
  Filled 2015-06-01: qty 2

## 2015-06-01 MED ORDER — DARBEPOETIN ALFA 300 MCG/0.6ML IJ SOSY
300.0000 ug | PREFILLED_SYRINGE | Freq: Once | INTRAMUSCULAR | Status: AC
  Administered 2015-06-01: 300 ug via SUBCUTANEOUS
  Filled 2015-06-01: qty 0.6

## 2015-06-01 MED ORDER — DIPHENHYDRAMINE HCL 25 MG PO CAPS
ORAL_CAPSULE | ORAL | Status: AC
  Filled 2015-06-01: qty 1

## 2015-06-01 MED ORDER — DIPHENHYDRAMINE HCL 25 MG PO CAPS
25.0000 mg | ORAL_CAPSULE | Freq: Once | ORAL | Status: AC
  Administered 2015-06-01: 25 mg via ORAL

## 2015-06-01 MED ORDER — ACETAMINOPHEN 325 MG PO TABS
650.0000 mg | ORAL_TABLET | Freq: Once | ORAL | Status: AC
  Administered 2015-06-01: 650 mg via ORAL

## 2015-06-01 NOTE — Patient Instructions (Signed)
Darbepoetin Alfa injection What is this medicine? DARBEPOETIN ALFA (dar be POE e tin AL fa) helps your body make more red blood cells. It is used to treat anemia caused by chronic kidney failure and chemotherapy. This medicine may be used for other purposes; ask your health care provider or pharmacist if you have questions. COMMON BRAND NAME(S): Aranesp What should I tell my health care provider before I take this medicine? They need to know if you have any of these conditions: -blood clotting disorders or history of blood clots -cancer patient not on chemotherapy -cystic fibrosis -heart disease, such as angina, heart failure, or a history of a heart attack -hemoglobin level of 12 g/dL or greater -high blood pressure -low levels of folate, iron, or vitamin B12 -seizures -an unusual or allergic reaction to darbepoetin, erythropoietin, albumin, hamster proteins, latex, other medicines, foods, dyes, or preservatives -pregnant or trying to get pregnant -breast-feeding How should I use this medicine? This medicine is for injection into a vein or under the skin. It is usually given by a health care professional in a hospital or clinic setting. If you get this medicine at home, you will be taught how to prepare and give this medicine. Do not shake the solution before you withdraw a dose. Use exactly as directed. Take your medicine at regular intervals. Do not take your medicine more often than directed. It is important that you put your used needles and syringes in a special sharps container. Do not put them in a trash can. If you do not have a sharps container, call your pharmacist or healthcare provider to get one. Talk to your pediatrician regarding the use of this medicine in children. While this medicine may be used in children as young as 1 year for selected conditions, precautions do apply. Overdosage: If you think you have taken too much of this medicine contact a poison control center or  emergency room at once. NOTE: This medicine is only for you. Do not share this medicine with others. What if I miss a dose? If you miss a dose, take it as soon as you can. If it is almost time for your next dose, take only that dose. Do not take double or extra doses. What may interact with this medicine? Do not take this medicine with any of the following medications: -epoetin alfa This list may not describe all possible interactions. Give your health care provider a list of all the medicines, herbs, non-prescription drugs, or dietary supplements you use. Also tell them if you smoke, drink alcohol, or use illegal drugs. Some items may interact with your medicine. What should I watch for while using this medicine? Visit your prescriber or health care professional for regular checks on your progress and for the needed blood tests and blood pressure measurements. It is especially important for the doctor to make sure your hemoglobin level is in the desired range, to limit the risk of potential side effects and to give you the best benefit. Keep all appointments for any recommended tests. Check your blood pressure as directed. Ask your doctor what your blood pressure should be and when you should contact him or her. As your body makes more red blood cells, you may need to take iron, folic acid, or vitamin B supplements. Ask your doctor or health care provider which products are right for you. If you have kidney disease continue dietary restrictions, even though this medication can make you feel better. Talk with your doctor or health   care professional about the foods you eat and the vitamins that you take. What side effects may I notice from receiving this medicine? Side effects that you should report to your doctor or health care professional as soon as possible: -allergic reactions like skin rash, itching or hives, swelling of the face, lips, or tongue -breathing problems -changes in vision -chest  pain -confusion, trouble speaking or understanding -feeling faint or lightheaded, falls -high blood pressure -muscle aches or pains -pain, swelling, warmth in the leg -rapid weight gain -severe headaches -sudden numbness or weakness of the face, arm or leg -trouble walking, dizziness, loss of balance or coordination -seizures (convulsions) -swelling of the ankles, feet, hands -unusually weak or tired Side effects that usually do not require medical attention (report to your doctor or health care professional if they continue or are bothersome): -diarrhea -fever, chills (flu-like symptoms) -headaches -nausea, vomiting -redness, stinging, or swelling at site where injected This list may not describe all possible side effects. Call your doctor for medical advice about side effects. You may report side effects to FDA at 1-800-FDA-1088. Where should I keep my medicine? Keep out of the reach of children. Store in a refrigerator between 2 and 8 degrees C (36 and 46 degrees F). Do not freeze. Do not shake. Throw away any unused portion if using a single-dose vial. Throw away any unused medicine after the expiration date. NOTE: This sheet is a summary. It may not cover all possible information. If you have questions about this medicine, talk to your doctor, pharmacist, or health care provider.  2015, [employer]. (2008-10-19 10:23:57)  

## 2015-06-01 NOTE — Patient Instructions (Signed)
Anemia, Nonspecific Anemia is a condition in which the concentration of red blood cells or hemoglobin in the blood is below normal. Hemoglobin is a substance in red blood cells that carries oxygen to the tissues of the body. Anemia results in not enough oxygen reaching these tissues.  CAUSES  Common causes of anemia include:   Excessive bleeding. Bleeding may be internal or external. This includes excessive bleeding from periods (in women) or from the intestine.   Poor nutrition.   Chronic kidney, thyroid, and liver disease.  Bone marrow disorders that decrease red blood cell production.  Cancer and treatments for cancer.  HIV, AIDS, and their treatments.  Spleen problems that increase red blood cell destruction.  Blood disorders.  Excess destruction of red blood cells due to infection, medicines, and autoimmune disorders. SIGNS AND SYMPTOMS   Minor weakness.   Dizziness.   Headache.  Palpitations.   Shortness of breath, especially with exercise.   Paleness.  Cold sensitivity.  Indigestion.  Nausea.  Difficulty sleeping.  Difficulty concentrating. Symptoms may occur suddenly or they may develop slowly.  DIAGNOSIS  Additional blood tests are often needed. These help your health care provider determine the best treatment. Your health care provider will check your stool for blood and look for other causes of blood loss.  TREATMENT  Treatment varies depending on the cause of the anemia. Treatment can include:   Supplements of iron, vitamin B12, or folic acid.   Hormone medicines.   A blood transfusion. This may be needed if blood loss is severe.   Hospitalization. This may be needed if there is significant continual blood loss.   Dietary changes.  Spleen removal. HOME CARE INSTRUCTIONS Keep all follow-up appointments. It often takes many weeks to correct anemia, and having your health care provider check on your condition and your response to  treatment is very important. SEEK IMMEDIATE MEDICAL CARE IF:   You develop extreme weakness, shortness of breath, or chest pain.   You become dizzy or have trouble concentrating.  You develop heavy vaginal bleeding.   You develop a rash.   You have bloody or black, tarry stools.   You faint.   You vomit up blood.   You vomit repeatedly.   You have abdominal pain.  You have a fever or persistent symptoms for more than 2-3 days.   You have a fever and your symptoms suddenly get worse.   You are dehydrated.  MAKE SURE YOU:  Understand these instructions.  Will watch your condition.  Will get help right away if you are not doing well or get worse. Document Released: 12/13/2004 Document Revised: 07/08/2013 Document Reviewed: 05/01/2013 Exit[employer] Patient Information 2015 [employer]. This information is not intended to replace advice given to you by your health care provider. Make sure you discuss any questions you have with your health care provider.  

## 2015-06-02 LAB — TYPE AND SCREEN
ABO/RH(D): O POS
Antibody Screen: NEGATIVE
Unit division: 0
Unit division: 0

## 2015-06-15 ENCOUNTER — Ambulatory Visit (HOSPITAL_BASED_OUTPATIENT_CLINIC_OR_DEPARTMENT_OTHER): Payer: Medicare Other

## 2015-06-15 ENCOUNTER — Other Ambulatory Visit (HOSPITAL_BASED_OUTPATIENT_CLINIC_OR_DEPARTMENT_OTHER): Payer: Medicare Other

## 2015-06-15 VITALS — BP 141/59 | HR 81 | Temp 98.2°F

## 2015-06-15 DIAGNOSIS — D471 Chronic myeloproliferative disease: Secondary | ICD-10-CM

## 2015-06-15 DIAGNOSIS — N189 Chronic kidney disease, unspecified: Secondary | ICD-10-CM

## 2015-06-15 DIAGNOSIS — D631 Anemia in chronic kidney disease: Secondary | ICD-10-CM | POA: Diagnosis not present

## 2015-06-15 DIAGNOSIS — D649 Anemia, unspecified: Secondary | ICD-10-CM

## 2015-06-15 LAB — CBC WITH DIFFERENTIAL/PLATELET
BASO%: 0.9 % (ref 0.0–2.0)
Basophils Absolute: 0 10*3/uL (ref 0.0–0.1)
EOS%: 2.1 % (ref 0.0–7.0)
Eosinophils Absolute: 0.1 10*3/uL (ref 0.0–0.5)
HCT: 37.3 % — ABNORMAL LOW (ref 38.4–49.9)
HGB: 10.8 g/dL — ABNORMAL LOW (ref 13.0–17.1)
LYMPH#: 0.9 10*3/uL (ref 0.9–3.3)
LYMPH%: 20.7 % (ref 14.0–49.0)
MCH: 26.9 pg — ABNORMAL LOW (ref 27.2–33.4)
MCHC: 29 g/dL — ABNORMAL LOW (ref 32.0–36.0)
MCV: 92.8 fL (ref 79.3–98.0)
MONO#: 0.3 10*3/uL (ref 0.1–0.9)
MONO%: 6.2 % (ref 0.0–14.0)
NEUT#: 3.1 10*3/uL (ref 1.5–6.5)
NEUT%: 70.1 % (ref 39.0–75.0)
NRBC: 3 % — AB (ref 0–0)
Platelets: 125 10*3/uL — ABNORMAL LOW (ref 140–400)
RBC: 4.02 10*6/uL — ABNORMAL LOW (ref 4.20–5.82)
RDW: 28.6 % — AB (ref 11.0–14.6)
WBC: 4.4 10*3/uL (ref 4.0–10.3)

## 2015-06-15 LAB — TECHNOLOGIST REVIEW

## 2015-06-15 LAB — HOLD TUBE, BLOOD BANK

## 2015-06-15 MED ORDER — DARBEPOETIN ALFA 300 MCG/0.6ML IJ SOSY
300.0000 ug | PREFILLED_SYRINGE | Freq: Once | INTRAMUSCULAR | Status: AC
  Administered 2015-06-15: 300 ug via SUBCUTANEOUS
  Filled 2015-06-15: qty 0.6

## 2015-06-20 ENCOUNTER — Ambulatory Visit (HOSPITAL_COMMUNITY)
Admission: RE | Admit: 2015-06-20 | Discharge: 2015-06-20 | Disposition: A | Discharge status: Home / Self Care | Payer: Medicare Other | Source: Ambulatory Visit | Attending: Oncology | Admitting: Oncology

## 2015-06-29 ENCOUNTER — Other Ambulatory Visit: Payer: Medicare Other

## 2015-06-29 ENCOUNTER — Ambulatory Visit: Payer: Medicare Other | Admitting: Physician Assistant

## 2015-06-30 ENCOUNTER — Ambulatory Visit (HOSPITAL_BASED_OUTPATIENT_CLINIC_OR_DEPARTMENT_OTHER): Payer: Medicare Other | Admitting: Physician Assistant

## 2015-06-30 ENCOUNTER — Encounter: Payer: Self-pay | Admitting: Physician Assistant

## 2015-06-30 ENCOUNTER — Other Ambulatory Visit (HOSPITAL_BASED_OUTPATIENT_CLINIC_OR_DEPARTMENT_OTHER): Payer: Medicare Other

## 2015-06-30 ENCOUNTER — Telehealth: Payer: Self-pay | Admitting: Physician Assistant

## 2015-06-30 ENCOUNTER — Telehealth: Payer: Self-pay | Admitting: Nurse Practitioner

## 2015-06-30 VITALS — BP 143/57 | HR 88 | Temp 98.4°F | Resp 17 | Ht 69.0 in | Wt 170.1 lb

## 2015-06-30 DIAGNOSIS — D471 Chronic myeloproliferative disease: Secondary | ICD-10-CM

## 2015-06-30 DIAGNOSIS — R5383 Other fatigue: Secondary | ICD-10-CM | POA: Diagnosis not present

## 2015-06-30 DIAGNOSIS — D649 Anemia, unspecified: Secondary | ICD-10-CM

## 2015-06-30 DIAGNOSIS — I5033 Acute on chronic diastolic (congestive) heart failure: Secondary | ICD-10-CM

## 2015-06-30 DIAGNOSIS — D6489 Other specified anemias: Secondary | ICD-10-CM

## 2015-06-30 DIAGNOSIS — E039 Hypothyroidism, unspecified: Secondary | ICD-10-CM | POA: Diagnosis not present

## 2015-06-30 LAB — TECHNOLOGIST REVIEW: Technologist Review: 4

## 2015-06-30 LAB — CBC WITH DIFFERENTIAL/PLATELET
BASO%: 0.9 % (ref 0.0–2.0)
Basophils Absolute: 0.1 10*3/uL (ref 0.0–0.1)
EOS%: 2.7 % (ref 0.0–7.0)
Eosinophils Absolute: 0.1 10*3/uL (ref 0.0–0.5)
HCT: 39.4 % (ref 38.4–49.9)
HGB: 11.3 g/dL — ABNORMAL LOW (ref 13.0–17.1)
LYMPH%: 17.6 % (ref 14.0–49.0)
MCH: 26.3 pg — ABNORMAL LOW (ref 27.2–33.4)
MCHC: 28.7 g/dL — ABNORMAL LOW (ref 32.0–36.0)
MCV: 91.8 fL (ref 79.3–98.0)
MONO#: 0.4 10*3/uL (ref 0.1–0.9)
MONO%: 8.3 % (ref 0.0–14.0)
NEUT#: 3.7 10*3/uL (ref 1.5–6.5)
NEUT%: 70.5 % (ref 39.0–75.0)
Platelets: 134 10*3/uL — ABNORMAL LOW (ref 140–400)
RBC: 4.29 10*6/uL (ref 4.20–5.82)
RDW: 29.2 % — ABNORMAL HIGH (ref 11.0–14.6)
WBC: 5.3 10*3/uL (ref 4.0–10.3)
lymph#: 0.9 10*3/uL (ref 0.9–3.3)
nRBC: 2 % — ABNORMAL HIGH (ref 0–0)

## 2015-06-30 LAB — HOLD TUBE, BLOOD BANK

## 2015-06-30 NOTE — Patient Instructions (Signed)
Continue every 2 week lab and injection appointments If your fatigue persists or worsens follow-up with your primary care doctor for reevaluation of your thyroid and your history of diastolic heart failure Follow-up in 2 months

## 2015-06-30 NOTE — Progress Notes (Signed)
Hematology and Oncology Follow Up Visit  Kyle Heath [id number] 1927/09/09 81 y.o. 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 He he might have an element of myelodysplastic syndrome as well. Bone marrow biopsy in 01/2012 confirmed the diagnosis.   Current therapy:  Supportive transfusions every 2 weeks as needed and Aransep 300 mcg every two weeks.   Interim History:  Kyle Heath presents today for a routine visit with his daughter. Patient states that he feels fine. His daughter notices that he is still dragging and appears short of breath with his activities of daily living. His primary care physician is Dr. [name]. He was last seen by Dr. [name] in July. He does have a history of acute on chronic diastolic heart failure and hypothyroidism and these 2 issues may be contributing to his fatigue. He continues to return every 2 weeks of for a CBC and Aranesp injection if his hemoglobin is in a qualifying range.  He has no abdominal pain or joint aches. He has not reported any complications related to transfusions or Aranesp. He continues to be transfusion dependent but notes some increased shortness of breath with exertion lately. He continues to have reasonable quality of life and ambulates without any difficulties. He has not had any syncope or seizures. His appetite is reasonable although he does not eat regularly. His weight is relatively stable. He has not reported any chest pain. His level of fatigue remains at about baseline to slightly increased.  He is not reporting any recent hospitalization or emesis. Is not reporting any recent bleeding or infections. He has not reported any hematochezia or melena. Has not reported any frequency urgency or hesitancy. Has not reported any lymphadenopathy or rash. Has not reported any constitutional symptoms weight loss or appetite changes. His quality of life remains about the same.  Remainder of his review of systems is unremarkable.   Medications: I have reviewed the patient's current medications. Current Outpatient Prescriptions  Medication Sig Dispense Refill  . aspirin 81 MG tablet Take 81 mg by mouth daily after breakfast.     . carvedilol (COREG) 25 MG tablet Take 25 mg by mouth daily.  0  . furosemide (LASIX) 20 MG tablet Take 20 mg by mouth daily.    [name] Kitchen HYDROcodone-acetaminophen (NORCO/VICODIN) 5-325 MG per tablet Take 1-2 tablets by mouth every 6 (six) hours as needed for moderate pain. 20 tablet 0  . levothyroxine (SYNTHROID, LEVOTHROID) 88 MCG tablet Take 88 mcg by mouth daily after breakfast.     . Multiple Vitamin (MULTIVITAMIN WITH MINERALS) TABS Take 1 tablet by mouth daily.    . potassium chloride SA (K-DUR,KLOR-CON) 20 MEQ tablet Take 1 tablet (20 mEq total) by mouth daily. 30 tablet 0  . TAZTIA XT 360 MG 24 hr capsule Take 360 mg by mouth daily.    [name] Kitchen ULORIC 80 MG TABS Take 1 tablet by mouth daily.   0   No current facility-administered medications for this visit.   Facility-Administered Medications Ordered in Other Visits  Medication Dose Route Frequency Provider Last Rate Last Dose  . furosemide (LASIX) injection 20 mg  20 mg Intravenous Once [name], NP   Stopped at 02/13/13 1623     Allergies: No Known Allergies  Past Medical History, Surgical history, Social history, and Family History were reviewed and updated.   Physical Exam: Blood pressure 143/57, pulse 88, temperature 98.4 F (36.9 C), temperature source Oral, resp.  rate 17, height _0  (1.753 m), weight 170 lb 1.6 oz (77.157 kg), SpO2 96 %.  ECOG: 1  General appearance: alert. Not in any distress. Head: Normocephalic, without obvious abnormality Neck: no adenopathy Lymph nodes: Cervical, supraclavicular, and axillary nodes normal. Heart:regular rate and rhythm, S1, S2. Lung:chest clear, no wheezing, rales, normal symmetric air entry Abdomen: soft, non-tender, without  masses or organomegaly EXT:no erythema, induration, or nodules. Trace edema noted in his lower extremities bilaterally.  Lab Results:  CBC    Component Value Date/Time   WBC 5.3 06/30/2015 0847   WBC 2.3* 01/21/2013 1431   RBC 4.29 06/30/2015 0847   RBC 3.12* 01/21/2013 1431   HGB 11.3* 06/30/2015 0847   HGB 9.0* 01/21/2013 1431   HCT 39.4 06/30/2015 0847   HCT 27.4* 01/21/2013 1431   PLT 134* 06/30/2015 0847   PLT 217 01/21/2013 1431   MCV 91.8 06/30/2015 0847   MCV 87.8 01/21/2013 1431   MCH 26.3* 06/30/2015 0847   MCH 28.8 01/21/2013 1431   MCHC 28.7* 06/30/2015 0847   MCHC 32.8 01/21/2013 1431   RDW 29.2* 06/30/2015 0847   RDW 20.3* 01/21/2013 1431   LYMPHSABS 0.9 06/30/2015 0847   MONOABS 0.4 06/30/2015 0847   EOSABS 0.1 06/30/2015 0847   BASOSABS 0.1 06/30/2015 0847      Impression and Plan:  81 year old gentleman with the following issues:  1. Anemia as a part of myeloproliferative disorder likely myelofibrosis. He could also have an element of myelodysplastic syndrome as well. He is currently on supportive care only. He does not have abdominal pain or a large spleen. His hemoglobin is 11.3 today.  He will not require transfusion today or Aranesp injection.  The iron studies drawn to screen for end organ damage related to iron overload reveal a serum iron of 108, TIBC of 124 which is low percent saturation of 87% and a ferritin of 3454. His liver function tests were all normal.   2. Neutropenia: Resolved/Stable. He has not had any recurrent infections. No need for growth factor support.  3. Thrombocytopenia : Stable. No active bleeding. No transfusion indicated. His platelet count continues to be relatively stable.  4. HTN: On Cardizem and Cozaar per PCP.  5. IV Access: Poor peripheral IV access over the patient prefers to not pursue a Port-A-Cath at this time.   6. Fatigue: Likely multifactorial. Currently his hemoglobin is 11.3 so this is less likely a  factor. Patient and his daughter were advised that if of these symptoms persist to follow-up with his primary care physician for reevaluation of his thyroid and his history of acute on chronic diastolic heart failure.  7. Followup: Every 2 weeks for lab, injection, and possible transfusion. He'll have a clinical visits in 2 months.  Carlton Adam, PA-C 8/11/20169:37 AM

## 2015-06-30 NOTE — Telephone Encounter (Signed)
per pof to sch pty appt-gave pt copy of avs °

## 2015-06-30 NOTE — Telephone Encounter (Signed)
per pof to sch pt appt-gave pt copy of avs °

## 2015-07-01 ENCOUNTER — Other Ambulatory Visit: Payer: Medicare Other

## 2015-07-01 ENCOUNTER — Ambulatory Visit: Payer: Medicare Other | Admitting: Physician Assistant

## 2015-07-14 ENCOUNTER — Ambulatory Visit: Payer: Medicare Other

## 2015-07-14 ENCOUNTER — Other Ambulatory Visit (HOSPITAL_BASED_OUTPATIENT_CLINIC_OR_DEPARTMENT_OTHER): Payer: Medicare Other

## 2015-07-14 DIAGNOSIS — D471 Chronic myeloproliferative disease: Secondary | ICD-10-CM

## 2015-07-14 DIAGNOSIS — D649 Anemia, unspecified: Secondary | ICD-10-CM

## 2015-07-14 LAB — HOLD TUBE, BLOOD BANK

## 2015-07-14 LAB — TECHNOLOGIST REVIEW

## 2015-07-14 LAB — CBC WITH DIFFERENTIAL/PLATELET
BASO%: 0.9 % (ref 0.0–2.0)
Basophils Absolute: 0.1 10*3/uL (ref 0.0–0.1)
EOS%: 1.7 % (ref 0.0–7.0)
Eosinophils Absolute: 0.1 10*3/uL (ref 0.0–0.5)
HCT: 39.9 % (ref 38.4–49.9)
HEMOGLOBIN: 11.4 g/dL — AB (ref 13.0–17.1)
LYMPH#: 0.8 10*3/uL — AB (ref 0.9–3.3)
LYMPH%: 14.9 % (ref 14.0–49.0)
MCH: 25.7 pg — ABNORMAL LOW (ref 27.2–33.4)
MCHC: 28.6 g/dL — ABNORMAL LOW (ref 32.0–36.0)
MCV: 89.9 fL (ref 79.3–98.0)
MONO#: 0.3 10*3/uL (ref 0.1–0.9)
MONO%: 6.2 % (ref 0.0–14.0)
NEUT%: 76.3 % — ABNORMAL HIGH (ref 39.0–75.0)
NEUTROS ABS: 4.1 10*3/uL (ref 1.5–6.5)
NRBC: 2 % — AB (ref 0–0)
Platelets: 167 10*3/uL (ref 140–400)
RBC: 4.44 10*6/uL (ref 4.20–5.82)
RDW: 28.7 % — AB (ref 11.0–14.6)
WBC: 5.4 10*3/uL (ref 4.0–10.3)

## 2015-07-14 MED ORDER — DARBEPOETIN ALFA 300 MCG/0.6ML IJ SOSY: 300.0000 ug | PREFILLED_SYRINGE | Freq: Once | INTRAMUSCULAR | Status: DC

## 2015-07-28 ENCOUNTER — Telehealth: Payer: Self-pay

## 2015-07-28 ENCOUNTER — Ambulatory Visit (HOSPITAL_BASED_OUTPATIENT_CLINIC_OR_DEPARTMENT_OTHER): Payer: Medicare Other | Admitting: Nurse Practitioner

## 2015-07-28 ENCOUNTER — Other Ambulatory Visit (HOSPITAL_BASED_OUTPATIENT_CLINIC_OR_DEPARTMENT_OTHER): Payer: Medicare Other

## 2015-07-28 ENCOUNTER — Ambulatory Visit: Payer: Medicare Other

## 2015-07-28 VITALS — BP 118/56 | HR 68 | Temp 97.7°F | Resp 18 | Ht 69.0 in | Wt 167.8 lb

## 2015-07-28 DIAGNOSIS — R5381 Other malaise: Secondary | ICD-10-CM | POA: Diagnosis not present

## 2015-07-28 DIAGNOSIS — D471 Chronic myeloproliferative disease: Secondary | ICD-10-CM

## 2015-07-28 DIAGNOSIS — D6489 Other specified anemias: Secondary | ICD-10-CM

## 2015-07-28 DIAGNOSIS — R530 Neoplastic (malignant) related fatigue: Secondary | ICD-10-CM

## 2015-07-28 DIAGNOSIS — R609 Edema, unspecified: Secondary | ICD-10-CM | POA: Diagnosis not present

## 2015-07-28 DIAGNOSIS — D649 Anemia, unspecified: Secondary | ICD-10-CM

## 2015-07-28 LAB — CBC WITH DIFFERENTIAL/PLATELET
BASO%: 1.2 % (ref 0.0–2.0)
Basophils Absolute: 0.1 10*3/uL (ref 0.0–0.1)
EOS%: 1.6 % (ref 0.0–7.0)
Eosinophils Absolute: 0.1 10*3/uL (ref 0.0–0.5)
HEMATOCRIT: 39.3 % (ref 38.4–49.9)
HGB: 11.1 g/dL — ABNORMAL LOW (ref 13.0–17.1)
LYMPH%: 12.7 % — AB (ref 14.0–49.0)
MCH: 25.1 pg — AB (ref 27.2–33.4)
MCHC: 28.2 g/dL — AB (ref 32.0–36.0)
MCV: 88.9 fL (ref 79.3–98.0)
MONO#: 0.4 10*3/uL (ref 0.1–0.9)
MONO%: 7.5 % (ref 0.0–14.0)
NEUT#: 4.4 10*3/uL (ref 1.5–6.5)
NEUT%: 77 % — AB (ref 39.0–75.0)
Platelets: 136 10*3/uL — ABNORMAL LOW (ref 140–400)
RBC: 4.42 10*6/uL (ref 4.20–5.82)
RDW: 28.9 % — ABNORMAL HIGH (ref 11.0–14.6)
WBC: 5.7 10*3/uL (ref 4.0–10.3)
lymph#: 0.7 10*3/uL — ABNORMAL LOW (ref 0.9–3.3)
nRBC: 3 % — ABNORMAL HIGH (ref 0–0)

## 2015-07-28 LAB — TECHNOLOGIST REVIEW: Technologist Review: 2

## 2015-07-28 LAB — HOLD TUBE, BLOOD BANK

## 2015-07-28 MED ORDER — DARBEPOETIN ALFA 300 MCG/0.6ML IJ SOSY: 300.0000 ug | PREFILLED_SYRINGE | Freq: Once | INTRAMUSCULAR | Status: DC

## 2015-07-28 NOTE — Telephone Encounter (Signed)
Pt came for aranesp shot. He is SOB, extreme fatigue. Requesting to be seen.

## 2015-07-29 ENCOUNTER — Encounter: Payer: Self-pay | Admitting: Nurse Practitioner

## 2015-07-29 DIAGNOSIS — R5381 Other malaise: Secondary | ICD-10-CM | POA: Insufficient documentation

## 2015-07-29 DIAGNOSIS — R600 Localized edema: Secondary | ICD-10-CM | POA: Insufficient documentation

## 2015-07-29 DIAGNOSIS — R609 Edema, unspecified: Secondary | ICD-10-CM | POA: Insufficient documentation

## 2015-07-29 NOTE — Assessment & Plan Note (Signed)
Patient has chronic bilateral lower extremity peripheral edema as his baseline.  He has noted a slight increase in his lower extremity edema recently.  Patient reports that he has recently increased his Lasix from 20 mg per day up to 40 mg per day per his primary care provider.  Patient denies any worsening issues with shortness of breath; but does complain of chronic fatigue and deconditioning.  On exam.  Patient has no shortness of breath and lungs are essentially clear.  No acute respiratory distress noted.  Advised patient and his family would call patient's primary care provider to relay all details of today's visit.

## 2015-07-29 NOTE — Assessment & Plan Note (Signed)
Patient has been diagnosed with myelofibrosis and chronic anemia.  Patient has been receiving both Aranesp injections and transfusion on an as-needed basis only.  Patient is complaining of some chronic fatigue and deconditioning; but states that he's been feeling fairly well otherwise.  He has noted some mild increased peripheral edema to his bilateral lower extremities.  Blood counts today revealed a WBC of 5.7, ANC 4.4, hemoglobin 11.1, and platelet count 136.  Patient's hemoglobin appears stable; and he therefore needs no Aranesp injection today.  Advised patient and his family would call patient's primary care provider, Dr. [name] to review all details of today's visit.  Patient is scheduled to return on 08/11/2015 for labs and his next possible Aranesp injection.

## 2015-07-29 NOTE — Progress Notes (Signed)
[facility]   HPI: Kyle Heath 81 y.o. male diagnosed with myelofibrosis and chronic anemia.  Currently receives both transfusions and Aranesp on an as-needed basis.  Patient continues to complain of chronic fatigue and generalized deconditioning.  He denies any chest pain, chest pressure, shortness of breath, or pain with inspiration.  He has noted some mild increased peripheral edema to his bilateral lower extremities.  Patient denies any recent fevers or chills.  HPI  ROS  Past Medical History  Diagnosis Date  . Diabetes mellitus   . Anemia   . Hypothyroidism   . Hypertension   . Myeloproliferative disorder 06/30/2012  . CVA (cerebral infarction)   . Atrial flutter   . CHF (congestive heart failure)   . Gout   . BPH (benign prostatic hypertrophy)     Past Surgical History  Procedure Laterality Date  . Appendectomy    . Thyroid surgery    . Cyst removal neck  2009    has Anemia; Myeloproliferative disorder; Shortness of breath; Atrial flutter; Volume overload; Subacute delirium; Hypothyroidism; Pain; Fatigue; Weakness generalized; Acute on chronic diastolic heart failure; Acute respiratory failure with hypoxia; Essential hypertension, benign; Gout; CVA (cerebral infarction); Jaw pain; Peripheral edema; and Physical deconditioning on his problem list.    has No Known Allergies.    Medication List       This list is accurate as of: 07/28/15 11:59 PM.  Always use your most recent med list.               aspirin 81 MG tablet  Take 81 mg by mouth daily after breakfast.     carvedilol 25 MG tablet  Commonly known as:  COREG  Take 25 mg by mouth daily.     furosemide 20 MG tablet  Commonly known as:  LASIX  Take 40 mg by mouth daily.     HYDROcodone-acetaminophen 5-325 MG per tablet  Commonly known as:  NORCO/VICODIN  Take 1-2 tablets by mouth every 6 (six) hours as needed for moderate pain.     levothyroxine 88 MCG tablet  Commonly known as:   SYNTHROID, LEVOTHROID  Take 88 mcg by mouth daily after breakfast.     multivitamin with minerals Tabs tablet  Take 1 tablet by mouth daily.     potassium chloride SA 20 MEQ tablet  Commonly known as:  K-DUR,KLOR-CON  Take 1 tablet (20 mEq total) by mouth daily.     TAZTIA XT 360 MG 24 hr capsule  Generic drug:  diltiazem  Take 360 mg by mouth daily.     ULORIC 80 MG Tabs  Generic drug:  Febuxostat  Take 1 tablet by mouth daily.         PHYSICAL EXAMINATION  Oncology Vitals 07/28/2015 06/30/2015 06/15/2015 06/01/2015 06/01/2015 06/01/2015 06/01/2015  Height 175 cm 175 cm - - - - -  Weight 76.114 kg 77.157 kg - - - - -  Weight (lbs) 167 lbs 13 oz 170 lbs 2 oz - - - - -  BMI (kg/m2) 24.78 kg/m2 25.12 kg/m2 - - - - -  Temp 97.7 98.4 98.2 97.8 97.9 97.6 97.7  Pulse 68 88 81 70 76 71 69  Resp 18 17 - 18 18 18 18   Resp (Historical as of 06/19/12) - - - - - - -  SpO2 93 96 - 100 98 97 98  BSA (m2) 1.92 m2 1.94 m2 - - - - -   BP Readings from Last 3 Encounters:  07/28/15 118/56  06/30/15 143/57  06/15/15 141/59    Physical Exam  Constitutional: He is oriented to person, place, and time and well-developed, well-nourished, and in no distress.  HENT:  Head: Normocephalic and atraumatic.  Mouth/Throat: Oropharynx is clear and moist.  Eyes: Conjunctivae and EOM are normal. Pupils are equal, round, and reactive to light. Right eye exhibits no discharge. Left eye exhibits no discharge. No scleral icterus.  Neck: Normal range of motion. Neck supple. No JVD present. No tracheal deviation present. No thyromegaly present.  Cardiovascular: Normal rate, regular rhythm, normal heart sounds and intact distal pulses.   Pulmonary/Chest: Effort normal and breath sounds normal. No respiratory distress. He has no wheezes. He has no rales. He exhibits no tenderness.  Abdominal: Soft. Bowel sounds are normal. He exhibits no distension and no mass. There is no tenderness. There is no rebound and no  guarding.  Musculoskeletal: Normal range of motion. He exhibits edema. He exhibits no tenderness.  +2 edema to bilateral lower extremities.  Lymphadenopathy:    He has no cervical adenopathy.  Neurological: He is alert and oriented to person, place, and time. Gait normal.  Skin: Skin is warm and dry. No rash noted. No erythema. No pallor.  Psychiatric: Affect normal.  Nursing note and vitals reviewed.   LABORATORY DATA:. Appointment on 07/28/2015  Component Date Value Ref Range Status  . Hold Tube, Blood Bank 07/28/2015 Blood Bank Order Cancelled   Final  . WBC 07/28/2015 5.7  4.0 - 10.3 10e3/uL Final  . NEUT# 07/28/2015 4.4  1.5 - 6.5 10e3/uL Final  . HGB 07/28/2015 11.1* 13.0 - 17.1 g/dL Final  . HCT 07/28/2015 39.3  38.4 - 49.9 % Final  . Platelets 07/28/2015 136* 140 - 400 10e3/uL Final  . MCV 07/28/2015 88.9  79.3 - 98.0 fL Final  . MCH 07/28/2015 25.1* 27.2 - 33.4 pg Final  . MCHC 07/28/2015 28.2* 32.0 - 36.0 g/dL Final  . RBC 07/28/2015 4.42  4.20 - 5.82 10e6/uL Final  . RDW 07/28/2015 28.9* 11.0 - 14.6 % Final  . lymph# 07/28/2015 0.7* 0.9 - 3.3 10e3/uL Final  . MONO# 07/28/2015 0.4  0.1 - 0.9 10e3/uL Final  . Eosinophils Absolute 07/28/2015 0.1  0.0 - 0.5 10e3/uL Final  . Basophils Absolute 07/28/2015 0.1  0.0 - 0.1 10e3/uL Final  . NEUT% 07/28/2015 77.0* 39.0 - 75.0 % Final  . LYMPH% 07/28/2015 12.7* 14.0 - 49.0 % Final  . MONO% 07/28/2015 7.5  0.0 - 14.0 % Final  . EOS% 07/28/2015 1.6  0.0 - 7.0 % Final  . BASO% 07/28/2015 1.2  0.0 - 2.0 % Final  . nRBC 07/28/2015 3* 0 - 0 % Final  . Technologist Review 07/28/2015 2% Blasts   Final     RADIOGRAPHIC STUDIES: No results found.  ASSESSMENT/PLAN:    Fatigue Patient continues to complain of chronic fatigue and generalized deconditioning.  He denies any chest pain, chest pressure, shortness of breath, or pain with inspiration.  He has noted some mild increased peripheral edema to his bilateral lower  extremities.  Hemoglobin today is stable at 11.1; and patient does not require his Aranesp injection today.  Advised patient that agents, chronic fatigue and deconditioning is most likely related to his myelofibrosis and overall age.  Advised patient to continue to stay as active as possible.  Also discussed options of physical therapy to gain strength as well.  Myeloproliferative disorder Patient has been diagnosed with myelofibrosis and chronic anemia.  Patient has  been receiving both Aranesp injections and transfusion on an as-needed basis only.  Patient is complaining of some chronic fatigue and deconditioning; but states that he's been feeling fairly well otherwise.  He has noted some mild increased peripheral edema to his bilateral lower extremities.  Blood counts today revealed a WBC of 5.7, ANC 4.4, hemoglobin 11.1, and platelet count 136.  Patient's hemoglobin appears stable; and he therefore needs no Aranesp injection today.  Advised patient and his family would call patient's primary care provider, Dr. [name] to review all details of today's visit.  Patient is scheduled to return on 08/11/2015 for labs and his next possible Aranesp injection.  Peripheral edema Patient has chronic bilateral lower extremity peripheral edema as his baseline.  He has noted a slight increase in his lower extremity edema recently.  Patient reports that he has recently increased his Lasix from 20 mg per day up to 40 mg per day per his primary care provider.  Patient denies any worsening issues with shortness of breath; but does complain of chronic fatigue and deconditioning.  On exam.  Patient has no shortness of breath and lungs are essentially clear.  No acute respiratory distress noted.  Advised patient and his family would call patient's primary care provider to relay all details of today's visit.  Physical deconditioning Patient continues to complain of chronic fatigue and generalized  deconditioning.  He denies any chest pain, chest pressure, shortness of breath, or pain with inspiration.  He has noted some mild increased peripheral edema to his bilateral lower extremities.  Hemoglobin today is stable at 11.1; and patient does not require his Aranesp injection today.  Advised patient that agents, chronic fatigue and deconditioning is most likely related to his myelofibrosis and overall age.  Advised patient to continue to stay as active as possible.  Also discussed options of physical therapy to gain strength as well.  Patient stated understanding of all instructions; and was in agreement with this plan of care. The patient knows to call the clinic with any problems, questions or concerns.   Review/collaboration with Dr. [name] regarding all aspects of patient's visit today.   Total time spent with patient was 25 minutes;  with greater than 65 percent of that time spent in face to face counseling regarding patient's symptoms,  and coordination of care and follow up.  Disclaimer:This dictation was prepared with Dragon/digital dictation along with [employer]. Any transcriptional errors that result from this process are unintentional.  Drue Second, NP 07/29/2015

## 2015-07-29 NOTE — Assessment & Plan Note (Addendum)
Patient continues to complain of chronic fatigue and generalized deconditioning.  He denies any chest pain, chest pressure, shortness of breath, or pain with inspiration.  He has noted some mild increased peripheral edema to his bilateral lower extremities.  Hemoglobin today is stable at 11.1; and patient does not require his Aranesp injection today.  Advised patient that patient's chronic fatigue and deconditioning is most likely related to his myelofibrosis and overall age.  Advised patient to continue to stay as active as possible.  Also discussed options of physical therapy to gain strength as well.

## 2015-08-11 ENCOUNTER — Ambulatory Visit: Payer: Medicare Other

## 2015-08-11 ENCOUNTER — Other Ambulatory Visit (HOSPITAL_BASED_OUTPATIENT_CLINIC_OR_DEPARTMENT_OTHER): Payer: Medicare Other

## 2015-08-11 DIAGNOSIS — D649 Anemia, unspecified: Secondary | ICD-10-CM

## 2015-08-11 DIAGNOSIS — D471 Chronic myeloproliferative disease: Secondary | ICD-10-CM

## 2015-08-11 LAB — MANUAL DIFFERENTIAL
ALC: 0.9 10*3/uL (ref 0.9–3.3)
ANC (CHCC manual diff): 5.1 10*3/uL (ref 1.5–6.5)
BASOPHIL: 6 % — AB (ref 0–2)
Band Neutrophils: 0 % (ref 0–10)
Blasts: 2 % — ABNORMAL HIGH (ref 0–0)
EOS: 1 % (ref 0–7)
LYMPH: 13 % — ABNORMAL LOW (ref 14–49)
MONO: 5 % (ref 0–14)
Metamyelocytes: 0 % (ref 0–0)
Myelocytes: 1 % — ABNORMAL HIGH (ref 0–0)
OTHER CELL: 0 % (ref 0–0)
PLT EST: DECREASED
PROMYELO: 0 % (ref 0–0)
SEG: 72 % (ref 38–77)
Variant Lymph: 0 % (ref 0–0)
nRBC: 4 % — ABNORMAL HIGH (ref 0–0)

## 2015-08-11 LAB — CBC WITH DIFFERENTIAL/PLATELET
HCT: 42.2 % (ref 38.4–49.9)
HGB: 11.8 g/dL — ABNORMAL LOW (ref 13.0–17.1)
MCH: 24.9 pg — AB (ref 27.2–33.4)
MCHC: 28 g/dL — ABNORMAL LOW (ref 32.0–36.0)
MCV: 89 fL (ref 79.3–98.0)
Platelets: 124 10*3/uL — ABNORMAL LOW (ref 140–400)
RBC: 4.74 10*6/uL (ref 4.20–5.82)
RDW: 28.5 % — AB (ref 11.0–14.6)
WBC: 7 10*3/uL (ref 4.0–10.3)

## 2015-08-11 LAB — HOLD TUBE, BLOOD BANK

## 2015-08-11 MED ORDER — DARBEPOETIN ALFA 300 MCG/0.6ML IJ SOSY: 300.0000 ug | PREFILLED_SYRINGE | Freq: Once | INTRAMUSCULAR | Status: DC

## 2015-08-25 ENCOUNTER — Ambulatory Visit: Payer: Medicare Other

## 2015-08-25 ENCOUNTER — Other Ambulatory Visit (HOSPITAL_BASED_OUTPATIENT_CLINIC_OR_DEPARTMENT_OTHER): Payer: Medicare Other

## 2015-08-25 ENCOUNTER — Ambulatory Visit (HOSPITAL_BASED_OUTPATIENT_CLINIC_OR_DEPARTMENT_OTHER): Payer: Medicare Other | Admitting: Oncology

## 2015-08-25 ENCOUNTER — Telehealth: Payer: Self-pay | Admitting: Oncology

## 2015-08-25 VITALS — BP 136/58 | HR 78 | Temp 97.5°F | Resp 20 | Ht 69.0 in | Wt 172.9 lb

## 2015-08-25 DIAGNOSIS — C946 Myelodysplastic disease, not classified: Secondary | ICD-10-CM

## 2015-08-25 DIAGNOSIS — D649 Anemia, unspecified: Secondary | ICD-10-CM | POA: Diagnosis not present

## 2015-08-25 DIAGNOSIS — D471 Chronic myeloproliferative disease: Secondary | ICD-10-CM

## 2015-08-25 DIAGNOSIS — I10 Essential (primary) hypertension: Secondary | ICD-10-CM | POA: Diagnosis not present

## 2015-08-25 DIAGNOSIS — R5383 Other fatigue: Secondary | ICD-10-CM

## 2015-08-25 LAB — CBC WITH DIFFERENTIAL/PLATELET
BASO%: 1.5 % (ref 0.0–2.0)
BASOS ABS: 0.1 10*3/uL (ref 0.0–0.1)
EOS ABS: 0.1 10*3/uL (ref 0.0–0.5)
EOS%: 1.5 % (ref 0.0–7.0)
HEMATOCRIT: 45.7 % (ref 38.4–49.9)
HGB: 12.9 g/dL — ABNORMAL LOW (ref 13.0–17.1)
LYMPH%: 15 % (ref 14.0–49.0)
MCH: 25.2 pg — ABNORMAL LOW (ref 27.2–33.4)
MCHC: 28.2 g/dL — AB (ref 32.0–36.0)
MCV: 89.4 fL (ref 79.3–98.0)
MONO#: 0.6 10*3/uL (ref 0.1–0.9)
MONO%: 9.3 % (ref 0.0–14.0)
NEUT#: 4.8 10*3/uL (ref 1.5–6.5)
NEUT%: 72.7 % (ref 39.0–75.0)
Platelets: 84 10*3/uL — ABNORMAL LOW (ref 140–400)
RBC: 5.11 10*6/uL (ref 4.20–5.82)
RDW: 27.9 % — ABNORMAL HIGH (ref 11.0–14.6)
WBC: 6.6 10*3/uL (ref 4.0–10.3)
lymph#: 1 10*3/uL (ref 0.9–3.3)
nRBC: 9 % — ABNORMAL HIGH (ref 0–0)

## 2015-08-25 LAB — HOLD TUBE, BLOOD BANK

## 2015-08-25 LAB — TECHNOLOGIST REVIEW: Technologist Review: 5

## 2015-08-25 NOTE — Progress Notes (Signed)
Hematology and Oncology Follow Up Visit  Kyle Heath [id number] 1926/12/05 81 y.o. 08/25/2015 10:33 AM    Principle Diagnosis: 81 year old gentleman with diagnosis of pancytopenia  due to myelofibrosis. He he might have an element of myelodysplastic syndrome as well. Bone marrow biopsy in 01/2012 confirmed the diagnosis.   Current therapy:  Supportive transfusions every 2 weeks as needed and Aransep 300 mcg every two weeks.   Interim History:  Kyle Heath presents today for a routine visit with his daughter. Since the last visit,  he has noted some further deterioration in his health. He had developed worsening edema and likely worsening congestive heart failure. His primary care physician is Dr. [name] has been managing his diuresis.   He continues to return every 2 weeks of for a CBC and Aranesp injection if his hemoglobin is in a qualifying range.  His hemoglobin have an excellent without any need for transfusions in the last 3 months. He has no abdominal pain or joint aches. He has not reported any complications related to transfusions or Aranesp.    He continues to have reasonable quality of life and ambulates without any difficulties. He has not had any syncope or seizures. His appetite is reasonable although he does not eat regularly. His weight is relatively stable. He has not reported any chest pain. His level of fatigue remains at about baseline to slightly increased.  He is not reporting any recent hospitalization or emesis. Is not reporting any recent bleeding or infections. He has not reported any hematochezia or melena. Has not reported any frequency urgency or hesitancy. Has not reported any lymphadenopathy or rash. Has not reported any constitutional symptoms weight loss or appetite changes. His quality of life remains about the same. Remainder of his review of systems is unremarkable.   Medications: I have reviewed the patient's current medications. Current Outpatient  Prescriptions  Medication Sig Dispense Refill  . aspirin 81 MG tablet Take 81 mg by mouth daily after breakfast.     . carvedilol (COREG) 25 MG tablet Take 25 mg by mouth daily.  0  . furosemide (LASIX) 20 MG tablet Take 40 mg by mouth daily.     [name] Kitchen HYDROcodone-acetaminophen (NORCO/VICODIN) 5-325 MG per tablet Take 1-2 tablets by mouth every 6 (six) hours as needed for moderate pain. 20 tablet 0  . levothyroxine (SYNTHROID, LEVOTHROID) 88 MCG tablet Take 88 mcg by mouth daily after breakfast.     . Multiple Vitamin (MULTIVITAMIN WITH MINERALS) TABS Take 1 tablet by mouth daily.    . potassium chloride SA (K-DUR,KLOR-CON) 20 MEQ tablet Take 1 tablet (20 mEq total) by mouth daily. 30 tablet 0  . TAZTIA XT 360 MG 24 hr capsule Take 360 mg by mouth daily.    [name] Kitchen ULORIC 80 MG TABS Take 1 tablet by mouth daily.   0   No current facility-administered medications for this visit.   Facility-Administered Medications Ordered in Other Visits  Medication Dose Route Frequency Provider Last Rate Last Dose  . furosemide (LASIX) injection 20 mg  20 mg Intravenous Once [name], NP   Stopped at 02/13/13 1623     Allergies: No Known Allergies  Past Medical History, Surgical history, Social history, and Family History were reviewed and updated.   Physical Exam: Blood pressure 136/58, pulse 78, temperature 97.5 F (36.4 C), temperature source Oral, resp. rate 20, height 5' 9"  (1.753 m), weight 172 lb 14.4 oz (78.427 kg), SpO2 97 %.  ECOG: 1  General appearance:  alert. Frail elderly without distress. Head: Normocephalic, without obvious abnormality Neck: no adenopathy Lymph nodes: Cervical, supraclavicular, and axillary nodes normal. Heart:regular rate and rhythm, S1, S2. Lung:chest clear, no wheezing, rales, normal symmetric air entry Abdomen: soft, non-tender, without masses or organomegaly EXT:no erythema, induration, edema noted bilaterally.   Lab Results:  CBC    Component Value  Date/Time   WBC 6.6 08/25/2015 0948   WBC 2.3* 01/21/2013 1431   RBC 5.11 08/25/2015 0948   RBC 3.12* 01/21/2013 1431   HGB 12.9* 08/25/2015 0948   HGB 9.0* 01/21/2013 1431   HCT 45.7 08/25/2015 0948   HCT 27.4* 01/21/2013 1431   PLT 84* 08/25/2015 0948   PLT 217 01/21/2013 1431   MCV 89.4 08/25/2015 0948   MCV 87.8 01/21/2013 1431   MCH 25.2* 08/25/2015 0948   MCH 28.8 01/21/2013 1431   MCHC 28.2* 08/25/2015 0948   MCHC 32.8 01/21/2013 1431   RDW 27.9* 08/25/2015 0948   RDW 20.3* 01/21/2013 1431   LYMPHSABS 1.0 08/25/2015 0948   MONOABS 0.6 08/25/2015 0948   EOSABS 0.1 08/25/2015 0948   BASOSABS 0.1 08/25/2015 0948      Impression and Plan:  81 year old gentleman with the following issues:  1. Anemia as a part of myeloproliferative disorder likely myelofibrosis. He could also have an element of myelodysplastic syndrome as well. He is currently on supportive care only. He does not have abdominal pain or a large spleen.  His hemoglobin is within normal range at this time and does not require any transfusions. He does not require any growth factor support at this time. The plan is to continue to monitor him every 2 week basis regarding that.    2. Neutropenia: Resolved/Stable. He has not had any recurrent infections. No need for growth factor support.  3. Thrombocytopenia : counts are relatively stable without any bleeding.   4. HTN: On Cardizem and Cozaar per PCP.  5. IV Access: Poor peripheral IV access over the patient prefers to not pursue a Port-A-Cath at this time.   6. Fatigue: Likely multifactorial. likely cardiac in etiology as well with lower extremity edema and diastolic dysfunction.   7. Followup: Every 2 weeks for lab, injection, and possible transfusion. He will have clinical visits in 2 months.  YCXKGY,JEHUD, MD 10/6/201610:33 AM

## 2015-08-25 NOTE — Telephone Encounter (Signed)
Gave dtr avs report and appointments for October thru December.

## 2015-08-25 NOTE — Addendum Note (Signed)
Addended by: Randolm Idol on: 08/25/2015 10:57 AM   Modules accepted: Orders, Medications

## 2015-08-31 ENCOUNTER — Encounter: Payer: Self-pay | Admitting: Cardiology

## 2015-09-02 ENCOUNTER — Encounter: Payer: Self-pay | Admitting: Cardiology

## 2015-09-02 ENCOUNTER — Ambulatory Visit (INDEPENDENT_AMBULATORY_CARE_PROVIDER_SITE_OTHER): Payer: Medicare Other | Admitting: Cardiology

## 2015-09-02 VITALS — BP 150/82 | HR 85 | Ht 69.0 in | Wt 176.4 lb

## 2015-09-02 DIAGNOSIS — I5033 Acute on chronic diastolic (congestive) heart failure: Secondary | ICD-10-CM

## 2015-09-02 DIAGNOSIS — I10 Essential (primary) hypertension: Secondary | ICD-10-CM

## 2015-09-02 DIAGNOSIS — I4892 Unspecified atrial flutter: Secondary | ICD-10-CM | POA: Diagnosis not present

## 2015-09-02 LAB — BASIC METABOLIC PANEL
BUN: 22 mg/dL (ref 7–25)
CHLORIDE: 100 mmol/L (ref 98–110)
CO2: 22 mmol/L (ref 20–31)
CREATININE: 1.3 mg/dL — AB (ref 0.70–1.11)
Calcium: 8.6 mg/dL (ref 8.6–10.3)
Glucose, Bld: 90 mg/dL (ref 65–99)
Potassium: 4.3 mmol/L (ref 3.5–5.3)
Sodium: 137 mmol/L (ref 135–146)

## 2015-09-02 NOTE — Progress Notes (Addendum)
09/02/2015 Kyle Heath   November 21, 1926  [id number]  Primary Physician Kyle Spurling, MD Primary Cardiologist: New  Reason for Visit/CC: New Patient Evaluation for Presumed Diastolic CHF  Referring Physician: Dr. Jeanann Heath  HPI:  The patient is a 81 year old male, new to our practice, who was referred by his PCP Dr. Jeanann Heath for evaluation of presumed diastolic CHF. Per office records from Dr. Ainsley Heath office, he has a history of chronic diastolic heart failure, h/o atrial flutter in 2014 (not on anticoagulation due to bleed risk), myeloproliferative disorder followed by Dr. Alen Heath, COPD, tobacco use, hypohyroidism and hypertension. An echocardiogram obtained in Dr. Harless Heath office February 2014 demonstrated normal left ventricular systolic function with an ejection fraction of 55-60%. Grade 2 diastolic dysfunction was noted. There was mild aortic regurgitation and mild mitral regurgitation at that time. The left atrium was mildly dilated. Peak pulmonary artery pressure was 60 mmHg. EKG at that time showed NSR. His medications include diltiazem, carvedilol, aspirin,  furosemide and levothyroxin. At one point, he was on Losartan but this was discontinued by Kyle Heath because he had issues with hypotension.   He was recently was seen by Dr. Carlis Heath 08/29/2015. Per office note, it was stated that the patient's daughter had endorsed that he had appeared more short of breath over the last few days and had experience weight gain and fluid retention. Per note, he had 2+ pitting edema on exam. His Furosemide was increased from 20 mg to 40 mg daily (no baseline renal function documented). He was instructed to follow-up in our office for further evaluation.  Today in clinic, his daughter reports significant subjective improvement in his edema. She has also noted some improvement in his breathing although he does still have mild exertional dyspnea. The patient also has some difficulty sleeping at night due  to orthopnea but denies any PND. Also denies chest pain, palpitations, syncope/near-syncope. He reports full medication compliance but admits that he has been nonadherent to a low-sodium diet. He eats a lot of salt. He has also not been compliant with daily weights.    Current Outpatient Prescriptions  Medication Sig Dispense Refill  . aspirin 81 MG tablet Take 81 mg by mouth daily after breakfast.     . carvedilol (COREG) 25 MG tablet Take 25 mg by mouth daily.  0  . furosemide (LASIX) 40 MG tablet Take 40 mg by mouth daily.  0  . HYDROcodone-acetaminophen (NORCO/VICODIN) 5-325 MG tablet Take 1 tablet by mouth every 6 (six) hours as needed for moderate pain.    [name] Kitchen levothyroxine (SYNTHROID, LEVOTHROID) 88 MCG tablet Take 88 mcg by mouth daily before breakfast.     . Multiple Vitamin (MULTIVITAMIN WITH MINERALS) TABS Take 1 tablet by mouth daily.    . potassium chloride SA (K-DUR,KLOR-CON) 20 MEQ tablet Take 1 tablet (20 mEq total) by mouth daily. 30 tablet 0  . TAZTIA XT 360 MG 24 hr capsule Take 360 mg by mouth daily.    [name] Kitchen ULORIC 80 MG TABS Take 1 tablet by mouth daily.   0   No current facility-administered medications for this visit.   Facility-Administered Medications Ordered in Other Visits  Medication Dose Route Frequency Provider Last Rate Last Dose  . furosemide (LASIX) injection 20 mg  20 mg Intravenous Once Kyle Shape, NP   Stopped at 02/13/13 1623    No Known Allergies  Social History   Social History  . Marital Status: Widowed    Spouse Name: N/A  . Number of  Children: N/A  . Years of Education: N/A   Occupational History  . Not on file.   Social History Main Topics  . Smoking status: Former Smoker    Types: Cigarettes, Cigars  . Smokeless tobacco: Current User    Types: Chew  . Alcohol Use: No  . Drug Use: No  . Sexual Activity: Yes    Birth Control/ Protection: None   Other Topics Concern  . Not on file   Social History Narrative   Lives in a house  with his daughter.  Does not use a cane or walker.   He drives (but is not supposed to).                Review of Systems: General: negative for chills, fever, night sweats or weight changes.  Cardiovascular: negative for chest pain, dyspnea on exertion, edema, orthopnea, palpitations, paroxysmal nocturnal dyspnea or shortness of breath Dermatological: negative for rash Respiratory: negative for cough or wheezing Urologic: negative for hematuria Abdominal: negative for nausea, vomiting, diarrhea, bright red blood per rectum, melena, or hematemesis Neurologic: negative for visual changes, syncope, or dizziness All other systems reviewed and are otherwise negative except as noted above.    Blood pressure 150/82, pulse 85, height 5\' 9"  (1.753 m), weight 176 lb 6.4 oz (80.015 kg).  General appearance: alert, cooperative and no distress Neck: no carotid bruit and no JVD Lungs: decreased BS at the bases bilaterally. No rales, rhonchi or wheezing Heart: regular rate and rhythm and soft 1/6 murmur along LUSB Extremities: 1+ bilateral pitting edema Pulses: 2+ and symmetric Skin: warm and dry  Neurologic: Grossly normal  EKG NSR. 87 bpm. Nonspecific T wave abnormalities.   ASSESSMENT AND PLAN:   1. Acute CHF Exacerbation: in the setting of dietary indiscretion with sodium. Presumed to be diastolic. 2D echo 12/2012 showed normal LVEF of 55-60%. However, will recheck a 2D echo to insure that he has had no decline in systolic function. Continue 40 mg of Lasix daily. We will obtain a BMP to assess renal function and electrolytes. He is also to avoid use of sodium. We will re-evaluate in 1-2 weeks to reassess response. If he continues to have fluid retention and DOE after sodium restriction, we will further increase his lasix, if renal function is stable.   2. Paroxsymal Atrial Flutter: h/o Aflutter in 2014. EKG today shows NSR. He denies any recent h/o palpitations. His HR is controlled in the 80s  on Cardizem and Coreg. He is not a candidate for oral anticoagulation due to myeloproliferative disorder. Continue daily ASA.   3. HTN: mildly elevated but stable. Continue Coreg, Cardizem and Lasix.  4. Hypothyroidism: on levothyroxine. Followed by PCP.   5. Myeloproliferative disorder: Followed by oncology    Kyle Heath, BRITTAINY PA-C 09/02/2015 10:00 AM Patient was seen in clinic.  I agree with the examination and findings as noted above.

## 2015-09-02 NOTE — Patient Instructions (Addendum)
Medication Instructions:  Your physician recommends that you continue on your current medications as directed. Please refer to the Current Medication list given to you today.     Labwork: TODAY:  BMET  Testing/Procedures: Your physician has requested that you have an echocardiogram. Echocardiography is a painless test that uses sound waves to create images of your heart. It provides your doctor with information about the size and shape of your heart and how well your heart's chambers and valves are working. This procedure takes approximately one hour. There are no restrictions for this procedure.    Follow-Up: Your physician recommends that you schedule a follow-up appointment in: 1-2 WEEKS WITH 1ST AVAILABLE EXTENDER.     Any Other Special Instructions Will Be Listed Below (If Applicable).  Echocardiogram An echocardiogram, or echocardiography, uses sound waves (ultrasound) to produce an image of your heart. The echocardiogram is simple, painless, obtained within a short period of time, and offers valuable information to your health care provider. The images from an echocardiogram can provide information such as:  Evidence of coronary artery disease (CAD).  Heart size.  Heart muscle function.  Heart valve function.  Aneurysm detection.  Evidence of a past heart attack.  Fluid buildup around the heart.  Heart muscle thickening.  Assess heart valve function. LET [facility] CARE PROVIDER KNOW ABOUT:  Any allergies you have.  All medicines you are taking, including vitamins, herbs, eye drops, creams, and over-the-counter medicines.  Previous problems you or members of your family have had with the use of anesthetics.  Any blood disorders you have.  Previous surgeries you have had.  Medical conditions you have.  Possibility of pregnancy, if this applies. BEFORE THE PROCEDURE  No special preparation is needed. Eat and drink normally.  PROCEDURE   In order to  produce an image of your heart, gel will be applied to your chest and a wand-like tool (transducer) will be moved over your chest. The gel will help transmit the sound waves from the transducer. The sound waves will harmlessly bounce off your heart to allow the heart images to be captured in real-time motion. These images will then be recorded.  You may need an IV to receive a medicine that improves the quality of the pictures. AFTER THE PROCEDURE You may return to your normal schedule including diet, activities, and medicines, unless your health care provider tells you otherwise.   This information is not intended to replace advice given to you by your health care provider. Make sure you discuss any questions you have with your health care provider.   Document Released: 11/02/2000 Document Revised: 11/26/2014 Document Reviewed: 07/13/2013 Elsevier Interactive Patient Education [employer].

## 2015-09-07 ENCOUNTER — Encounter (HOSPITAL_COMMUNITY): Payer: Self-pay | Admitting: *Deleted

## 2015-09-07 ENCOUNTER — Inpatient Hospital Stay (HOSPITAL_COMMUNITY)
Admission: EM | Admit: 2015-09-07 | Discharge: 2015-09-16 | DRG: 291 | Disposition: A | Discharge status: Home Health | Payer: Medicare Other | Attending: Internal Medicine | Admitting: Internal Medicine

## 2015-09-07 ENCOUNTER — Emergency Department (HOSPITAL_COMMUNITY): Payer: Medicare Other

## 2015-09-07 DIAGNOSIS — Z8673 Personal history of transient ischemic attack (TIA), and cerebral infarction without residual deficits: Secondary | ICD-10-CM

## 2015-09-07 DIAGNOSIS — I509 Heart failure, unspecified: Secondary | ICD-10-CM

## 2015-09-07 DIAGNOSIS — N400 Enlarged prostate without lower urinary tract symptoms: Secondary | ICD-10-CM | POA: Diagnosis present

## 2015-09-07 DIAGNOSIS — I130 Hypertensive heart and chronic kidney disease with heart failure and stage 1 through stage 4 chronic kidney disease, or unspecified chronic kidney disease: Principal | ICD-10-CM | POA: Diagnosis present

## 2015-09-07 DIAGNOSIS — N179 Acute kidney failure, unspecified: Secondary | ICD-10-CM | POA: Diagnosis not present

## 2015-09-07 DIAGNOSIS — M109 Gout, unspecified: Secondary | ICD-10-CM | POA: Diagnosis present

## 2015-09-07 DIAGNOSIS — I444 Left anterior fascicular block: Secondary | ICD-10-CM | POA: Diagnosis present

## 2015-09-07 DIAGNOSIS — R531 Weakness: Secondary | ICD-10-CM

## 2015-09-07 DIAGNOSIS — C946 Myelodysplastic disease, not classified: Secondary | ICD-10-CM | POA: Diagnosis present

## 2015-09-07 DIAGNOSIS — E038 Other specified hypothyroidism: Secondary | ICD-10-CM

## 2015-09-07 DIAGNOSIS — F0390 Unspecified dementia without behavioral disturbance: Secondary | ICD-10-CM | POA: Diagnosis present

## 2015-09-07 DIAGNOSIS — E1122 Type 2 diabetes mellitus with diabetic chronic kidney disease: Secondary | ICD-10-CM | POA: Diagnosis present

## 2015-09-07 DIAGNOSIS — I959 Hypotension, unspecified: Secondary | ICD-10-CM | POA: Diagnosis present

## 2015-09-07 DIAGNOSIS — Z794 Long term (current) use of insulin: Secondary | ICD-10-CM

## 2015-09-07 DIAGNOSIS — E440 Moderate protein-calorie malnutrition: Secondary | ICD-10-CM | POA: Diagnosis present

## 2015-09-07 DIAGNOSIS — I5031 Acute diastolic (congestive) heart failure: Secondary | ICD-10-CM | POA: Diagnosis not present

## 2015-09-07 DIAGNOSIS — D471 Chronic myeloproliferative disease: Secondary | ICD-10-CM | POA: Diagnosis present

## 2015-09-07 DIAGNOSIS — J90 Pleural effusion, not elsewhere classified: Secondary | ICD-10-CM

## 2015-09-07 DIAGNOSIS — I5043 Acute on chronic combined systolic (congestive) and diastolic (congestive) heart failure: Secondary | ICD-10-CM | POA: Diagnosis present

## 2015-09-07 DIAGNOSIS — R609 Edema, unspecified: Secondary | ICD-10-CM | POA: Diagnosis present

## 2015-09-07 DIAGNOSIS — I4581 Long QT syndrome: Secondary | ICD-10-CM | POA: Diagnosis present

## 2015-09-07 DIAGNOSIS — N390 Urinary tract infection, site not specified: Secondary | ICD-10-CM | POA: Diagnosis present

## 2015-09-07 DIAGNOSIS — N183 Chronic kidney disease, stage 3 (moderate): Secondary | ICD-10-CM | POA: Diagnosis present

## 2015-09-07 DIAGNOSIS — I10 Essential (primary) hypertension: Secondary | ICD-10-CM | POA: Diagnosis present

## 2015-09-07 DIAGNOSIS — I248 Other forms of acute ischemic heart disease: Secondary | ICD-10-CM | POA: Diagnosis present

## 2015-09-07 DIAGNOSIS — Z720 Tobacco use: Secondary | ICD-10-CM

## 2015-09-07 DIAGNOSIS — I480 Paroxysmal atrial fibrillation: Secondary | ICD-10-CM | POA: Diagnosis present

## 2015-09-07 DIAGNOSIS — I340 Nonrheumatic mitral (valve) insufficiency: Secondary | ICD-10-CM | POA: Diagnosis present

## 2015-09-07 DIAGNOSIS — I4892 Unspecified atrial flutter: Secondary | ICD-10-CM | POA: Diagnosis present

## 2015-09-07 DIAGNOSIS — J189 Pneumonia, unspecified organism: Secondary | ICD-10-CM | POA: Diagnosis present

## 2015-09-07 DIAGNOSIS — Z7982 Long term (current) use of aspirin: Secondary | ICD-10-CM

## 2015-09-07 DIAGNOSIS — D696 Thrombocytopenia, unspecified: Secondary | ICD-10-CM | POA: Diagnosis present

## 2015-09-07 DIAGNOSIS — G9340 Encephalopathy, unspecified: Secondary | ICD-10-CM | POA: Diagnosis present

## 2015-09-07 DIAGNOSIS — R0902 Hypoxemia: Secondary | ICD-10-CM

## 2015-09-07 DIAGNOSIS — J9601 Acute respiratory failure with hypoxia: Secondary | ICD-10-CM | POA: Diagnosis not present

## 2015-09-07 DIAGNOSIS — D649 Anemia, unspecified: Secondary | ICD-10-CM | POA: Diagnosis present

## 2015-09-07 DIAGNOSIS — I351 Nonrheumatic aortic (valve) insufficiency: Secondary | ICD-10-CM | POA: Diagnosis present

## 2015-09-07 DIAGNOSIS — J449 Chronic obstructive pulmonary disease, unspecified: Secondary | ICD-10-CM | POA: Diagnosis present

## 2015-09-07 DIAGNOSIS — R5381 Other malaise: Secondary | ICD-10-CM | POA: Diagnosis present

## 2015-09-07 DIAGNOSIS — E039 Hypothyroidism, unspecified: Secondary | ICD-10-CM | POA: Diagnosis present

## 2015-09-07 HISTORY — DX: Disease of blood and blood-forming organs, unspecified: D75.9

## 2015-09-07 HISTORY — DX: Personal history of other medical treatment: Z92.89

## 2015-09-07 HISTORY — DX: Chronic obstructive pulmonary disease, unspecified: J44.9

## 2015-09-07 HISTORY — DX: Type 2 diabetes mellitus without complications: E11.9

## 2015-09-07 HISTORY — DX: Cerebral infarction, unspecified: I63.9

## 2015-09-07 HISTORY — DX: Chronic combined systolic (congestive) and diastolic (congestive) heart failure: I50.42

## 2015-09-07 HISTORY — DX: Unspecified osteoarthritis, unspecified site: M19.90

## 2015-09-07 HISTORY — DX: Unspecified atrial flutter: I48.92

## 2015-09-07 HISTORY — DX: Unspecified dementia, unspecified severity, without behavioral disturbance, psychotic disturbance, mood disturbance, and anxiety: F03.90

## 2015-09-07 LAB — COMPREHENSIVE METABOLIC PANEL
ALT: 21 U/L (ref 17–63)
ANION GAP: 12 (ref 5–15)
AST: 28 U/L (ref 15–41)
Albumin: 2.8 g/dL — ABNORMAL LOW (ref 3.5–5.0)
Alkaline Phosphatase: 65 U/L (ref 38–126)
BUN: 26 mg/dL — ABNORMAL HIGH (ref 6–20)
CHLORIDE: 97 mmol/L — AB (ref 101–111)
CO2: 28 mmol/L (ref 22–32)
CREATININE: 1.68 mg/dL — AB (ref 0.61–1.24)
Calcium: 8.5 mg/dL — ABNORMAL LOW (ref 8.9–10.3)
GFR calc non Af Amer: 35 mL/min — ABNORMAL LOW (ref >60)
GFR, EST AFRICAN AMERICAN: 41 mL/min — AB (ref >60)
Glucose, Bld: 116 mg/dL — ABNORMAL HIGH (ref 65–99)
POTASSIUM: 4.8 mmol/L (ref 3.5–5.1)
SODIUM: 137 mmol/L (ref 135–145)
Total Bilirubin: 1.4 mg/dL — ABNORMAL HIGH (ref 0.3–1.2)
Total Protein: 6.3 g/dL — ABNORMAL LOW (ref 6.5–8.1)

## 2015-09-07 LAB — URINALYSIS, ROUTINE W REFLEX MICROSCOPIC
GLUCOSE, UA: NEGATIVE mg/dL
Hgb urine dipstick: NEGATIVE
KETONES UR: NEGATIVE mg/dL
NITRITE: NEGATIVE
PH: 5 (ref 5.0–8.0)
Protein, ur: 100 mg/dL — AB
SPECIFIC GRAVITY, URINE: 1.02 (ref 1.005–1.030)
Urobilinogen, UA: 1 mg/dL (ref 0.0–1.0)

## 2015-09-07 LAB — CBC WITH DIFFERENTIAL/PLATELET
BASOS PCT: 0 %
Band Neutrophils: 0 %
Basophils Absolute: 0 10*3/uL (ref 0.0–0.1)
Blasts: 6 %
EOS PCT: 0 %
Eosinophils Absolute: 0 10*3/uL (ref 0.0–0.7)
HEMATOCRIT: 53.2 % — AB (ref 39.0–52.0)
Hemoglobin: 14.8 g/dL (ref 13.0–17.0)
LYMPHS ABS: 1.2 10*3/uL (ref 0.7–4.0)
LYMPHS PCT: 10 %
MCH: 24.9 pg — AB (ref 26.0–34.0)
MCHC: 27.8 g/dL — ABNORMAL LOW (ref 30.0–36.0)
MCV: 89.6 fL (ref 78.0–100.0)
MONO ABS: 0.6 10*3/uL (ref 0.1–1.0)
MONOS PCT: 5 %
Metamyelocytes Relative: 0 %
Myelocytes: 0 %
NEUTROS ABS: 9.4 10*3/uL — AB (ref 1.7–7.7)
NEUTROS PCT: 79 %
NRBC: 0 /100{WBCs}
OTHER: 0 %
PLATELETS: 67 10*3/uL — AB (ref 150–400)
Promyelocytes Absolute: 0 %
RBC: 5.94 MIL/uL — AB (ref 4.22–5.81)
RDW: 27.6 % — AB (ref 11.5–15.5)
WBC: 11.9 10*3/uL — AB (ref 4.0–10.5)

## 2015-09-07 LAB — BRAIN NATRIURETIC PEPTIDE: B Natriuretic Peptide: 1729.4 pg/mL — ABNORMAL HIGH (ref 0.0–100.0)

## 2015-09-07 LAB — URINE MICROSCOPIC-ADD ON

## 2015-09-07 LAB — TSH: TSH: 4.57 u[IU]/mL — AB (ref 0.350–4.500)

## 2015-09-07 LAB — MAGNESIUM: Magnesium: 1.9 mg/dL (ref 1.7–2.4)

## 2015-09-07 LAB — GLUCOSE, CAPILLARY: GLUCOSE-CAPILLARY: 108 mg/dL — AB (ref 65–99)

## 2015-09-07 LAB — D-DIMER, QUANTITATIVE: D-Dimer, Quant: 0.57 ug/mL-FEU — ABNORMAL HIGH (ref 0.00–0.48)

## 2015-09-07 LAB — PROCALCITONIN: PROCALCITONIN: 0.45 ng/mL

## 2015-09-07 LAB — TROPONIN I: Troponin I: 0.04 ng/mL — ABNORMAL HIGH (ref <0.031)

## 2015-09-07 LAB — PATHOLOGIST SMEAR REVIEW: Path Review: INCREASED

## 2015-09-07 MED ORDER — INSULIN ASPART 100 UNIT/ML ~~LOC~~ SOLN: 0.0000 [IU] | Freq: Every day | SUBCUTANEOUS | Status: DC

## 2015-09-07 MED ORDER — CARVEDILOL 25 MG PO TABS
25.0000 mg | ORAL_TABLET | Freq: Every day | ORAL | Status: DC
  Administered 2015-09-07 – 2015-09-08 (×2): 25 mg via ORAL
  Filled 2015-09-07 (×2): qty 1

## 2015-09-07 MED ORDER — DILTIAZEM HCL ER BEADS 240 MG PO CP24
360.0000 mg | ORAL_CAPSULE | Freq: Every day | ORAL | Status: DC
  Administered 2015-09-07: 360 mg via ORAL
  Filled 2015-09-07 (×3): qty 1

## 2015-09-07 MED ORDER — DEXTROSE 5 % IV SOLN
500.0000 mg | Freq: Once | INTRAVENOUS | Status: AC
  Administered 2015-09-07: 500 mg via INTRAVENOUS
  Filled 2015-09-07: qty 500

## 2015-09-07 MED ORDER — LEVOTHYROXINE SODIUM 88 MCG PO TABS
88.0000 ug | ORAL_TABLET | Freq: Every day | ORAL | Status: DC
  Administered 2015-09-08 – 2015-09-16 (×9): 88 ug via ORAL
  Filled 2015-09-07 (×11): qty 1

## 2015-09-07 MED ORDER — HYDROCODONE-ACETAMINOPHEN 5-325 MG PO TABS
1.0000 | ORAL_TABLET | Freq: Four times a day (QID) | ORAL | Status: DC | PRN
  Filled 2015-09-07 (×2): qty 1

## 2015-09-07 MED ORDER — SODIUM CHLORIDE 0.9 % IV SOLN: INTRAVENOUS | Status: DC

## 2015-09-07 MED ORDER — ACETAMINOPHEN 325 MG PO TABS
650.0000 mg | ORAL_TABLET | Freq: Four times a day (QID) | ORAL | Status: DC | PRN
  Administered 2015-09-12 – 2015-09-14 (×3): 650 mg via ORAL
  Filled 2015-09-07 (×3): qty 2

## 2015-09-07 MED ORDER — TRAZODONE HCL 50 MG PO TABS
25.0000 mg | ORAL_TABLET | Freq: Every evening | ORAL | Status: DC | PRN
  Administered 2015-09-08 – 2015-09-14 (×3): 25 mg via ORAL
  Filled 2015-09-07 (×3): qty 1

## 2015-09-07 MED ORDER — FEBUXOSTAT 40 MG PO TABS
80.0000 mg | ORAL_TABLET | Freq: Every day | ORAL | Status: DC
  Administered 2015-09-07 – 2015-09-16 (×10): 80 mg via ORAL
  Filled 2015-09-07 (×11): qty 2

## 2015-09-07 MED ORDER — ONDANSETRON HCL 4 MG/2ML IJ SOLN: 4.0000 mg | Freq: Four times a day (QID) | INTRAMUSCULAR | Status: DC | PRN

## 2015-09-07 MED ORDER — FUROSEMIDE 10 MG/ML IJ SOLN
40.0000 mg | Freq: Two times a day (BID) | INTRAMUSCULAR | Status: DC
  Administered 2015-09-07 – 2015-09-08 (×2): 40 mg via INTRAVENOUS
  Filled 2015-09-07 (×2): qty 4

## 2015-09-07 MED ORDER — DEXTROSE 5 % IV SOLN
1.0000 g | Freq: Once | INTRAVENOUS | Status: AC
  Administered 2015-09-07: 1 g via INTRAVENOUS
  Filled 2015-09-07: qty 10

## 2015-09-07 MED ORDER — INSULIN ASPART 100 UNIT/ML ~~LOC~~ SOLN
0.0000 [IU] | Freq: Three times a day (TID) | SUBCUTANEOUS | Status: DC
  Administered 2015-09-08 – 2015-09-16 (×4): 1 [IU] via SUBCUTANEOUS

## 2015-09-07 MED ORDER — DEXTROSE 5 % IV SOLN: 500.0000 mg | INTRAVENOUS | Status: DC

## 2015-09-07 MED ORDER — ONDANSETRON HCL 4 MG PO TABS: 4.0000 mg | ORAL_TABLET | Freq: Four times a day (QID) | ORAL | Status: DC | PRN

## 2015-09-07 MED ORDER — ASPIRIN EC 81 MG PO TBEC
81.0000 mg | DELAYED_RELEASE_TABLET | Freq: Every day | ORAL | Status: DC
  Administered 2015-09-08 – 2015-09-12 (×5): 81 mg via ORAL
  Filled 2015-09-07 (×6): qty 1

## 2015-09-07 MED ORDER — ACETAMINOPHEN 650 MG RE SUPP: 650.0000 mg | Freq: Four times a day (QID) | RECTAL | Status: DC | PRN

## 2015-09-07 NOTE — ED Notes (Signed)
Pt given snacks and drinks. Pt also given a urinal, but states he is unable to provide a urine sample at this time.

## 2015-09-07 NOTE — ED Notes (Signed)
Patient given urinal for urine specimen.

## 2015-09-07 NOTE — ED Notes (Signed)
Reminded patient of urine specimen need

## 2015-09-07 NOTE — H&P (Signed)
[facility] History and Physical  Kyle Heath SAY:[id number] DOB: May 07, 1927 DOA: 09/07/2015  Referring physician: Eulis Heath PCP: Kyle Spurling, MD   Chief Complaint: generalized weakness/sob/myalgia  HPI: Kyle Heath is a very pleasant 81 y.o. male with a past medical history that includes diabetes, hypertension, myeloproliferative disorder, atrial flutter, chronic diastolic dysfunction presents to the emergency department with the chief complaint of worsening shortness of breath/generalized weakness and myalgia. Initial evaluation in the emergency department reveals hypoxia, urinary tract infection and concern for community-acquired pneumonia.  Information is obtained from the daughter who is at the bedside. She lives with the patient and says that for the last several weeks he has been complaining of generalized achiness and fatigue. Associated symptoms include worsening lower extremity edema, worsening shortness of breath and decreased oral intake. She  reports 4 days ago patient saw his cardiologist at that time Lasix was increased. She also admits to giving patient extra dose of Lasix over the last 2 days as his legs remained edematous. Patient denies abdominal pain nausea vomiting diarrhea constipation. He denies dysuria hematuria frequency or urgency. He denies chest pain palpitations but does endorse aching all over.  Workup in the emergency department includes complete blood counts significant for HCT 53.2 MCH 24.9, comprehensive metabolic panel significant for chloride 97, BUN 26, creatinine 1.68, total bili 1.4 albumin 2.8. Urinalysis consistent with urinary tract infection, chest x-ray reveals diffuse bilateral airspace disease, likely edema. Moderate right effusion with focal right lower lobe opacity which could also reflect edema or infection.  The emergency department he's afebrile and hemodynamically stable but hypoxic with oxygen saturation 85% and respiratory rate of 34.  He's provided with 2 L nasal cannula oxygen saturation level improved to 96%.  Review of Systems:  10 point review of systems complete and all systems are negative except as indicated in the history of present illness   Past Medical History  Diagnosis Date  . Diabetes mellitus   . Anemia   . Hypothyroidism   . Hypertension   . Myeloproliferative disorder ([place]) 06/30/2012  . CVA (cerebral infarction)   . Atrial flutter ([place])   . CHF (congestive heart failure) ([place])   . Gout   . BPH (benign prostatic hypertrophy)   . COPD (chronic obstructive pulmonary disease) ([place])   . Bone marrow disorder    Past Surgical History  Procedure Laterality Date  . Appendectomy    . Thyroid surgery    . Cyst removal neck  2009   Social History:  reports that he has quit smoking. His smoking use included Cigarettes and Cigars. His smokeless tobacco use includes Chew. He reports that he does not drink alcohol or use illicit drugs.  he lives at home his daughter lives with him he is ambulatory does not require cane or walker no recent falls fairly independent with ADLs. No Known Allergies  Family History  Problem Relation Age of Onset  . Heart disease Neg Hx   . Heart failure Neg Hx   . Prostate cancer       Prior to Admission medications   Medication Sig Start Date End Date Taking? Authorizing Provider  aspirin 81 MG tablet Take 81 mg by mouth daily after breakfast.    Yes Historical Provider, MD  carvedilol (COREG) 25 MG tablet Take 25 mg by mouth daily. 08/07/14  Yes Historical Provider, MD  furosemide (LASIX) 40 MG tablet Take 40 mg by mouth daily. 08/19/15  Yes Historical Provider, MD  HYDROcodone-acetaminophen (NORCO/VICODIN) 5-325 MG tablet Take  1 tablet by mouth every 6 (six) hours as needed for moderate pain.   Yes Historical Provider, MD  levothyroxine (SYNTHROID, LEVOTHROID) 88 MCG tablet Take 88 mcg by mouth daily before breakfast.    Yes Historical Provider, MD  Multiple Vitamin  (MULTIVITAMIN WITH MINERALS) TABS Take 1 tablet by mouth daily.   Yes Historical Provider, MD  naproxen sodium (ANAPROX) 220 MG tablet Take 440 mg by mouth 2 (two) times daily with a meal.   Yes Historical Provider, MD  potassium chloride SA (K-DUR,KLOR-CON) 20 MEQ tablet Take 1 tablet (20 mEq total) by mouth daily. 01/12/15  Yes Kyle Portela, MD  TAZTIA XT 360 MG 24 hr capsule Take 360 mg by mouth daily. 08/02/13  Yes Historical Provider, MD  ULORIC 80 MG TABS Take 1 tablet by mouth daily.  04/21/15  Yes Historical Provider, MD   Physical Exam: Filed Vitals:   09/07/15 1500 09/07/15 1515 09/07/15 1530 09/07/15 1545  BP: 126/72 127/66 130/64 127/65  Pulse: 95 99 95 94  Temp:      TempSrc:      Resp:      Weight:      SpO2: 97% 95% 94% 94%    Wt Readings from Last 3 Encounters:  09/07/15 73.029 kg (161 lb)  09/02/15 80.015 kg (176 lb 6.4 oz)  08/25/15 78.427 kg (172 lb 14.4 oz)    General:  Appears calm and comfortable but lethargic and somewhat frail  Eyes: PERRL, normal lids, irises & conjunctiva ENT: grossly normal hearing, Weakness membranes of his mouth are slightly pale dry  Neck: no LAD, masses or thyromegaly Cardiovascular: irregularly irregular hear no murmur no gallop no rub trace to 1+ lower extremity edema  Respiratory: Mild increased work of breathing with conversation. Breath sounds diminished on right base otherwise fairly good air flow no wheeze no rhonchi Abdomen: soft, ntnd is a bowel sounds no guarding Skin: no rash or induration seen on limited exam Musculoskeletal: grossly normal tone BUE/BLE, joints without swelling/erythema Psychiatric: grossly normal mood and affect, speech fluent and appropriate Neurologic: grossly non-focal. Speech slow but clear follows commands bilateral grip 5 out of 5           Labs on Admission:  Basic Metabolic Panel:  Recent Labs Lab 09/02/15 1026 09/07/15 1000  NA 137 137  K 4.3 4.8  CL 100 97*  CO2 22 28  GLUCOSE 90  116*  BUN 22 26*  CREATININE 1.30* 1.68*  CALCIUM 8.6 8.5*   Liver Function Tests:  Recent Labs Lab 09/07/15 1000  AST 28  ALT 21  ALKPHOS 65  BILITOT 1.4*  PROT 6.3*  ALBUMIN 2.8*   No results for input(s): LIPASE, AMYLASE in the last 168 hours. No results for input(s): AMMONIA in the last 168 hours. CBC:  Recent Labs Lab 09/07/15 1000  WBC 11.9*  NEUTROABS 9.4*  HGB 14.8  HCT 53.2*  MCV 89.6  PLT 67*   Cardiac Enzymes: No results for input(s): CKTOTAL, CKMB, CKMBINDEX, TROPONINI in the last 168 hours.  BNP (last 3 results) No results for input(s): BNP in the last 8760 hours.  ProBNP (last 3 results) No results for input(s): PROBNP in the last 8760 hours.  CBG: No results for input(s): GLUCAP in the last 168 hours.  Radiological Exams on Admission: Dg Chest 2 View  09/07/2015  CLINICAL DATA:  Weakness. EXAM: CHEST  2 VIEW COMPARISON:  01/04/2013 FINDINGS: Diffuse bilateral airspace opacities are noted. Moderate right effusion with more  confluent opacity at the right lung base. No definite effusion on the left. Heart is mildly enlarged. No acute bony abnormality. IMPRESSION: Diffuse bilateral airspace disease, likely edema. Moderate right effusion with focal right lower lobe opacity which could also reflect edema or infection. Electronically Signed   By: Rolm Baptise M.D.   On: 09/07/2015 11:09    EKG: Independently reviewed.Sinus arrhythmia left anterior fascicular block borderline repolarization abnormality prolonged QT interval  Assessment/Plan Principal Problem:   UTI (lower urinary tract infection) Active Problems:   Myeloproliferative disorder (HCC)   Atrial flutter (HCC)   Hypothyroidism   Weakness generalized   Essential hypertension, benign   Peripheral edema   Physical deconditioning   Hypoxia   Acute kidney injury ([place])   Chronic diastolic heart failure ([place])   #1. Urinary tract infection. They be related to increased Lasix over the last  week coupled with decreased oral intake. Await urine culture. Will continue Rocephin that was initiated in the emergency department. Currently's afebrile and nontoxic appearing.   #2. Acute kidney injury. Current creatinine is 1.6. Chart review indicates no real history of same. Likely related to increase in diuretics and decrease in oral intake. Will hold Lasix for now. We'll very gently hydrate monitor his urine output. If no improvement consider renal ultrasound  #3. Chronic diastolic heart failure/peripheral edema. Patient with history of chronic diastolic dysfunction. Chart review indicates office visit with cardiology 5 days ago. Peripheral edema worsening over time. Lasix had been increased by PCP prior to cardiology visit. Note indicated need for an updated echo. I will go ahead and get this. Last echo 2014 demonstrated normal left ventricular systolic function ejection fraction 55-60% with grade 2 diastolic dysfunction. There was mild aortic regurg and mild mitral regurg. Atrium was mildly dilated. Will monitor intake and output obtain daily weights. Home medications include Coreg will continue this.  4. Community-acquired pneumonia. Chest x-ray concerning for same. Patient with moderate respiratory distress and hypoxia of which #3 may be contributing as well. Will continue antibiotics initiated in the emergency department. Obtain strep pneumo urine antigen as well as Legionella urine antigen. Continue oxygen supplementation as indicated and wean as able.  5. A flutter. Her only rate controlled. Home medications include Taztia XT. Will continue. Not on anticoagulation as bleeding risk.  #6. myeloproliferative disorder and chronic anemia. Chart review indicates hemoglobin and hematocrit above baseline. This is likely related to vascular depletion. Will provide IV hydration and recheck in the morning. Chart review indicates he receives both transfusions and Aranesp on an as-needed basis. In addition  Heme notes indicate he has been complaining of chronic fatigue and deconditioning as well as LE edema. Will request physical therapy consult  #7. Hypoxia. Likely related to #3 and #4. Improved with oxygen supplementation. Biotic as noted above.  #8. Hypothyroidism. Will check his TSH. Continue his home medication   Code Status: full DVT Prophylaxis: Family Communication: daughter at bedside Disposition Plan:  Home likley 1-2 days  Time spent: 60 minutes  [facility]

## 2015-09-07 NOTE — ED Notes (Signed)
Patient returned from xray.

## 2015-09-07 NOTE — ED Provider Notes (Signed)
CSN: [id number]     Arrival date & time 09/07/15  4098 History   First MD Initiated Contact with Patient 09/07/15 [id number]     Chief Complaint  Patient presents with  . Fatigue  . Pain     (Consider location/radiation/quality/duration/timing/severity/associated sxs/prior Treatment) HPI   Kyle Heath is a 81 y.o. male who presents for evaluation of general achiness and fatigue. Symptoms are ongoing over the several weeks. During this time. He has seen his PCP, and had an increase of his Lasix, to help with peripheral edema. This has improved. The edema, and helped his weight. He gets regular blood draws to assess for anemia associated with myeloproliferative disorder. His last hemoglobin level was very good at 12. His usual weight is around 170 pounds. Today, the weight here is much lower. He has been eating fairly well, but somewhat less than usual. He continues to be able ambulate without a cane, and likes to spend time sitting outside in the sun. There has been no known fever, cough, or reported pain in the chest. The patient defers most answers to his daughter who is with him. There are no other known modifying factors.   Past Medical History  Diagnosis Date  . Diabetes mellitus   . Anemia   . Hypothyroidism   . Hypertension   . Myeloproliferative disorder ([place]) 06/30/2012  . CVA (cerebral infarction)   . Atrial flutter ([place])   . CHF (congestive heart failure) ([place])   . Gout   . BPH (benign prostatic hypertrophy)   . COPD (chronic obstructive pulmonary disease) ([place])   . Bone marrow disorder    Past Surgical History  Procedure Laterality Date  . Appendectomy    . Thyroid surgery    . Cyst removal neck  2009   Family History  Problem Relation Age of Onset  . Heart disease Neg Hx   . Heart failure Neg Hx   . Prostate cancer     Social History  Substance Use Topics  . Smoking status: Former Smoker    Types: Cigarettes, Cigars  . Smokeless tobacco: Current User    Types:  Chew  . Alcohol Use: No    Review of Systems  All other systems reviewed and are negative.     Allergies  Review of patient's allergies indicates no known allergies.  Home Medications   Prior to Admission medications   Medication Sig Start Date End Date Taking? Authorizing Provider  aspirin 81 MG tablet Take 81 mg by mouth daily after breakfast.    Yes Historical Provider, MD  carvedilol (COREG) 25 MG tablet Take 25 mg by mouth daily. 08/07/14  Yes Historical Provider, MD  furosemide (LASIX) 40 MG tablet Take 40 mg by mouth daily. 08/19/15  Yes Historical Provider, MD  HYDROcodone-acetaminophen (NORCO/VICODIN) 5-325 MG tablet Take 1 tablet by mouth every 6 (six) hours as needed for moderate pain.   Yes Historical Provider, MD  levothyroxine (SYNTHROID, LEVOTHROID) 88 MCG tablet Take 88 mcg by mouth daily before breakfast.    Yes Historical Provider, MD  Multiple Vitamin (MULTIVITAMIN WITH MINERALS) TABS Take 1 tablet by mouth daily.   Yes Historical Provider, MD  naproxen sodium (ANAPROX) 220 MG tablet Take 440 mg by mouth 2 (two) times daily with a meal.   Yes Historical Provider, MD  potassium chloride SA (K-DUR,KLOR-CON) 20 MEQ tablet Take 1 tablet (20 mEq total) by mouth daily. 01/12/15  Yes Wyatt Portela, MD  TAZTIA XT 360 MG 24 hr capsule  Take 360 mg by mouth daily. 08/02/13  Yes Historical Provider, MD  ULORIC 80 MG TABS Take 1 tablet by mouth daily.  04/21/15  Yes Historical Provider, MD   BP 128/68 mmHg  Pulse 94  Temp(Src) 97.8 F (36.6 C) (Oral)  Resp 16  Wt 161 lb (73.029 kg)  SpO2 96% Physical Exam  Constitutional: He is oriented to person, place, and time. He appears well-developed and well-nourished.  HENT:  Head: Normocephalic and atraumatic.  Right Ear: External ear normal.  Left Ear: External ear normal.  Eyes: Conjunctivae and EOM are normal. Pupils are equal, round, and reactive to light.  Neck: Normal range of motion and phonation normal. Neck supple.   Cardiovascular: Normal rate, regular rhythm and normal heart sounds.   Pulmonary/Chest: Effort normal and breath sounds normal. He has no wheezes. He has no rales. He exhibits no tenderness and no bony tenderness.  Hypoxic on room air, on arrival. No respiratory distress.  Abdominal: Soft. There is no tenderness.  Musculoskeletal: Normal range of motion.  Neurological: He is alert and oriented to person, place, and time. No cranial nerve deficit or sensory deficit. He exhibits normal muscle tone. Coordination normal.  Skin: Skin is warm, dry and intact.  Psychiatric: He has a normal mood and affect. His behavior is normal. Judgment and thought content normal.  Nursing note and vitals reviewed.   ED Course  Procedures (including critical care time)  Medications - No data to display  Patient Vitals for the past 24 hrs:  BP Temp Temp src Pulse Resp SpO2 Weight  09/07/15 1350 128/68 mmHg - - 94 16 96 % -  09/07/15 1109 (!) 138/113 mmHg - - 91 24 93 % -  09/07/15 1030 121/66 mmHg - - 91 23 96 % -  09/07/15 1015 119/63 mmHg - - 89 19 94 % -  09/07/15 1000 114/62 mmHg - - 88 18 95 % -  09/07/15 0950 - - - - - 93 % -  09/07/15 0946 146/64 mmHg 97.8 F (36.6 C) Oral 91 20 (!) 83 % 161 lb (73.029 kg)    3:17 PM Reevaluation with update and discussion. After initial assessment and treatment, an updated evaluation reveals patient remains comfortable. He is tolerating some oral nutrition and liquids. Patient's daughter updated on the findings. Kc Sedlak L    3:18 PM-Consult complete with hospitalist. Patient case explained and discussed. She agrees to admit patient for further evaluation and treatment. Call ended at [place] - Abnormal; Notable for the following:    Chloride 97 (*)    Glucose, Bld 116 (*)    BUN 26 (*)    Creatinine, Ser 1.68 (*)    Calcium 8.5 (*)    Total Protein 6.3 (*)    Albumin 2.8 (*)    Total Bilirubin  1.4 (*)    GFR calc non Af Amer 35 (*)    GFR calc Af Amer 41 (*)    All other components within normal limits  CBC WITH DIFFERENTIAL/PLATELET - Abnormal; Notable for the following:    WBC 11.9 (*)    RBC 5.94 (*)    HCT 53.2 (*)    MCH 24.9 (*)    MCHC 27.8 (*)    RDW 27.6 (*)    Platelets 67 (*)    All other components within normal limits  URINALYSIS, ROUTINE W REFLEX MICROSCOPIC (NOT AT [facility]) - Abnormal; Notable for the following:  Color, Urine AMBER (*)    APPearance HAZY (*)    Bilirubin Urine SMALL (*)    Protein, ur 100 (*)    Leukocytes, UA SMALL (*)    All other components within normal limits  D-DIMER, QUANTITATIVE (NOT AT [facility]) - Abnormal; Notable for the following:    D-Dimer, Quant 0.57 (*)    All other components within normal limits  URINE MICROSCOPIC-ADD ON - Abnormal; Notable for the following:    Squamous Epithelial / LPF FEW (*)    Bacteria, UA FEW (*)    Casts HYALINE CASTS (*)    All other components within normal limits  URINE CULTURE    BUN  Date Value Ref Range Status  09/07/2015 26* 6 - 20 mg/dL Final  09/02/2015 22 7 - 25 mg/dL Final  05/04/2015 17.9 7.0 - 26.0 mg/dL Final  01/21/2013 20 6 - 23 mg/dL Final  01/21/2013 19 6 - 23 mg/dL Final   CREATININE  Date Value Ref Range Status  05/04/2015 1.2 0.7 - 1.3 mg/dL Final   CREAT  Date Value Ref Range Status  09/02/2015 1.30* 0.70 - 1.11 mg/dL Final   CREATININE, SER  Date Value Ref Range Status  09/07/2015 1.68* 0.61 - 1.24 mg/dL Final  01/21/2013 1.12 0.50 - 1.35 mg/dL Final  01/21/2013 1.12 0.50 - 1.35 mg/dL Final  01/08/2013 1.09 0.50 - 1.35 mg/dL Final   Medications - No data to display  CRITICAL CARE Performed by: Daleen Bo L Total critical care time: 40 minutes Critical care time was exclusive of separately billable procedures and treating other patients. Critical care was necessary to treat or prevent imminent or life-threatening deterioration. Critical care was time  spent personally by me on the following activities: development of treatment plan with patient and/or surrogate as well as nursing, discussions with consultants, evaluation of patient's response to treatment, examination of patient, obtaining history from patient or surrogate, ordering and performing treatments and interventions, ordering and review of laboratory studies, ordering and review of radiographic studies, pulse oximetry and re-evaluation of patient's condition.    Imaging Review Dg Chest 2 View  09/07/2015  CLINICAL DATA:  Weakness. EXAM: CHEST  2 VIEW COMPARISON:  01/04/2013 FINDINGS: Diffuse bilateral airspace opacities are noted. Moderate right effusion with more confluent opacity at the right lung base. No definite effusion on the left. Heart is mildly enlarged. No acute bony abnormality. IMPRESSION: Diffuse bilateral airspace disease, likely edema. Moderate right effusion with focal right lower lobe opacity which could also reflect edema or infection. Electronically Signed   By: Rolm Baptise M.D.   On: 09/07/2015 11:09   I have personally reviewed and evaluated these images and lab results as part of my medical decision-making.   EKG Interpretation   Date/Time:  Wednesday September 07 2015 09:39:16 EDT Ventricular Rate:  98 PR Interval:  193 QRS Duration: 84 QT Interval:  405 QTC Calculation: 517 R Axis:   -47 Text Interpretation:  Sinus arrhythmia Left anterior fascicular block  Borderline repolarization abnormality Prolonged QT interval Since last  tracing QT has lengthened Confirmed by Eulis Foster  MD, Vira Agar ([id number]) on  09/07/2015 2:20:26 PM      MDM   Final diagnoses:  Hypoxia  AKI (acute kidney injury) ([place])  CAP (community acquired pneumonia)  UTI (lower urinary tract infection)  Pleural effusion    Myalgia, with nonspecific symptoms. Apparently, significant weight loss associated with recent increase of his increasing dose of Lasix. Chest x-ray indicates  worsening right-sided effusion with possibility  of acute right lower lobe infiltrate. Urinalysis indicates possible UTI. Doubt sepsis, metabolic instability or impending vascular collapse. Empiric antibiotics begun for respiratory and urinary tract infection. Doubt PE, normal age adjusted d-dimer. Oxygen remained normal with nasal cannula supplementation.  Nursing Notes Reviewed/ Care Coordinated, and agree without changes. Applicable Imaging Reviewed.  Interpretation of Laboratory Data incorporated into ED treatment  Plan: Admit   Daleen Bo, MD 09/07/15 1529

## 2015-09-07 NOTE — ED Notes (Signed)
Pt came to ED with his daughter. Pt has been feeling very fatigued for the last couple of weeks.  Pt has also been complaining of pain all over.  He has a pain underneath his right arm. Pt came to the ED for a further eval.

## 2015-09-08 ENCOUNTER — Other Ambulatory Visit: Payer: Self-pay

## 2015-09-08 ENCOUNTER — Ambulatory Visit: Payer: Medicare Other

## 2015-09-08 ENCOUNTER — Other Ambulatory Visit: Payer: Medicare Other

## 2015-09-08 ENCOUNTER — Observation Stay (HOSPITAL_BASED_OUTPATIENT_CLINIC_OR_DEPARTMENT_OTHER): Payer: Medicare Other

## 2015-09-08 DIAGNOSIS — R0600 Dyspnea, unspecified: Secondary | ICD-10-CM | POA: Diagnosis not present

## 2015-09-08 DIAGNOSIS — J90 Pleural effusion, not elsewhere classified: Secondary | ICD-10-CM | POA: Diagnosis not present

## 2015-09-08 DIAGNOSIS — I5033 Acute on chronic diastolic (congestive) heart failure: Secondary | ICD-10-CM | POA: Diagnosis not present

## 2015-09-08 DIAGNOSIS — N179 Acute kidney failure, unspecified: Secondary | ICD-10-CM | POA: Diagnosis not present

## 2015-09-08 DIAGNOSIS — I10 Essential (primary) hypertension: Secondary | ICD-10-CM | POA: Diagnosis not present

## 2015-09-08 DIAGNOSIS — E038 Other specified hypothyroidism: Secondary | ICD-10-CM | POA: Diagnosis not present

## 2015-09-08 DIAGNOSIS — D696 Thrombocytopenia, unspecified: Secondary | ICD-10-CM | POA: Diagnosis not present

## 2015-09-08 DIAGNOSIS — I4892 Unspecified atrial flutter: Secondary | ICD-10-CM | POA: Diagnosis not present

## 2015-09-08 DIAGNOSIS — I5043 Acute on chronic combined systolic (congestive) and diastolic (congestive) heart failure: Secondary | ICD-10-CM | POA: Diagnosis not present

## 2015-09-08 DIAGNOSIS — I959 Hypotension, unspecified: Secondary | ICD-10-CM | POA: Diagnosis not present

## 2015-09-08 DIAGNOSIS — J9601 Acute respiratory failure with hypoxia: Secondary | ICD-10-CM | POA: Diagnosis not present

## 2015-09-08 LAB — GLUCOSE, CAPILLARY
Glucose-Capillary: 104 mg/dL — ABNORMAL HIGH (ref 65–99)
Glucose-Capillary: 122 mg/dL — ABNORMAL HIGH (ref 65–99)
Glucose-Capillary: 119 mg/dL — ABNORMAL HIGH (ref 65–99)
Glucose-Capillary: 108 mg/dL — ABNORMAL HIGH (ref 65–99)

## 2015-09-08 LAB — BASIC METABOLIC PANEL
ANION GAP: 12 (ref 5–15)
BUN: 36 mg/dL — ABNORMAL HIGH (ref 6–20)
CO2: 28 mmol/L (ref 22–32)
Calcium: 8.3 mg/dL — ABNORMAL LOW (ref 8.9–10.3)
Chloride: 99 mmol/L — ABNORMAL LOW (ref 101–111)
Creatinine, Ser: 2 mg/dL — ABNORMAL HIGH (ref 0.61–1.24)
GFR calc non Af Amer: 29 mL/min — ABNORMAL LOW (ref >60)
GFR, EST AFRICAN AMERICAN: 33 mL/min — AB (ref >60)
GLUCOSE: 105 mg/dL — AB (ref 65–99)
POTASSIUM: 4.8 mmol/L (ref 3.5–5.1)
Sodium: 139 mmol/L (ref 135–145)

## 2015-09-08 LAB — CBC
HEMATOCRIT: 51.3 % (ref 39.0–52.0)
HEMOGLOBIN: 14.1 g/dL (ref 13.0–17.0)
MCH: 24.9 pg — AB (ref 26.0–34.0)
MCHC: 27.5 g/dL — AB (ref 30.0–36.0)
MCV: 90.5 fL (ref 78.0–100.0)
Platelets: 47 10*3/uL — ABNORMAL LOW (ref 150–400)
RBC: 5.67 MIL/uL (ref 4.22–5.81)
RDW: 27.4 % — ABNORMAL HIGH (ref 11.5–15.5)
WBC: 11.5 10*3/uL — ABNORMAL HIGH (ref 4.0–10.5)

## 2015-09-08 LAB — TROPONIN I
TROPONIN I: 0.19 ng/mL — AB (ref <0.031)
TROPONIN I: 0.06 ng/mL — AB (ref <0.031)

## 2015-09-08 LAB — STREP PNEUMONIAE URINARY ANTIGEN: Strep Pneumo Urinary Antigen: NEGATIVE

## 2015-09-08 LAB — PROCALCITONIN: PROCALCITONIN: 0.69 ng/mL

## 2015-09-08 MED ORDER — DILTIAZEM HCL ER COATED BEADS 180 MG PO CP24
360.0000 mg | ORAL_CAPSULE | Freq: Every day | ORAL | Status: DC
  Administered 2015-09-08: 360 mg via ORAL
  Filled 2015-09-08: qty 2

## 2015-09-08 MED ORDER — FUROSEMIDE 10 MG/ML IJ SOLN
40.0000 mg | Freq: Every day | INTRAMUSCULAR | Status: DC
  Administered 2015-09-08 – 2015-09-13 (×6): 40 mg via INTRAVENOUS
  Filled 2015-09-08 (×7): qty 4

## 2015-09-08 MED ORDER — NICOTINE 14 MG/24HR TD PT24
14.0000 mg | MEDICATED_PATCH | Freq: Every day | TRANSDERMAL | Status: DC
  Administered 2015-09-08 – 2015-09-15 (×8): 14 mg via TRANSDERMAL
  Filled 2015-09-08 (×8): qty 1

## 2015-09-08 MED ORDER — SODIUM CHLORIDE 0.9 % IV BOLUS (SEPSIS)
500.0000 mL | Freq: Once | INTRAVENOUS | Status: AC
  Administered 2015-09-08: 500 mL via INTRAVENOUS

## 2015-09-08 MED ORDER — DEXTROSE 5 % IV SOLN
1.0000 g | INTRAVENOUS | Status: DC
  Administered 2015-09-08 – 2015-09-09 (×2): 1 g via INTRAVENOUS
  Filled 2015-09-08 (×3): qty 10

## 2015-09-08 NOTE — Progress Notes (Signed)
  Echocardiogram 2D Echocardiogram has been performed.  [name] 09/08/2015, 3:29 PM

## 2015-09-08 NOTE — Progress Notes (Signed)
OT Cancellation Note  Patient Details Name: Kyle Heath MRN: [id number] DOB: 03/15/1927   Cancelled Treatment:    Reason Eval/Treat Not Completed: Medical issues which prohibited therapy  [name] 09/08/2015, 12:57 PM

## 2015-09-08 NOTE — Significant Event (Signed)
Rapid Response Event Note Called by primary RN to see pt for hypotension and sleepiness Overview: Time Called: 1730 Arrival Time: 1732 Event Type: Hypotension  Initial Focused Assessment: Pt was admitted for UTI and has been confused.  He received several meds (Coreg 25, cardizem 360mg ) earlier today that could impact BP & HR as well as lasix 40mg  x1 & narcotics.  Per report his SBP was 80 and I obtained a manual of 84/48 HR 54 sata 93% 2LNC, t=97.5 rectal.  He opens his eyes to hearing his name and is able to spell his name for me and engage in a fluid conversation.  He currently desires a hot bath.  Primary RN contacted Dr. [place] Pink and received orders for a NS bolus 500cc.  Will cont. To monitor.  Interventions: NS bolus 500cc x1  Event Summary: Name of Physician Notified: Dr. Curly Rim at 1740    at    Outcome: Stayed in room and stabalized     Kalianne Fetting [place]edgecock

## 2015-09-08 NOTE — Plan of Care (Signed)
Problem: Phase I Progression Outcomes Goal: Voiding-avoid urinary catheter unless indicated Outcome: Progressing Urinating well.

## 2015-09-08 NOTE — Progress Notes (Addendum)
[facility] PROGRESS NOTE  Kyle Heath KYH:[id number] DOB: 11/18/1927 DOA: 09/07/2015  PCP: Foye Spurling, MD  Brief HPI: 81 year old African-American male with a past medical history of diabetes, hypertension, myeloproliferative disorder, atrial fibrillation, chronic diastolic dysfunction and presented with complaints of shortness of breath, weakness. He was found to be hypoxic. He had abnormal UA. There was some concern for diastolic congestive heart failure. Patient was admitted to the hospital for further management.  Past medical history:  Past Medical History  Diagnosis Date  . Anemia   . Hypothyroidism   . Hypertension   . Myeloproliferative disorder ([place]) 06/30/2012  . Atrial flutter ([place])   . CHF (congestive heart failure) ([place])   . Gout   . BPH (benign prostatic hypertrophy)   . COPD (chronic obstructive pulmonary disease) ([place])   . Bone marrow disorder   . CVA (cerebral vascular accident) ([place])   . Type II diabetes mellitus ([place])   . History of blood transfusion "several"  . Dementia   . Arthritis     "mostly in joints" (09/07/2015)    Consultants: None  Procedures:  Echocardiogram is pending  Antibiotics: None  Subjective: Patient denies any chest pain, nausea, vomiting. He denies any shortness of breath. Patient likely has cognitive impairment which limits history.  Objective: Vital Signs  Filed Vitals:   09/07/15 2012 09/07/15 2355 09/08/15 0517 09/08/15 0700  BP: 93/49 104/73 112/47 100/51  Pulse: 76 60 119 55  Temp: 97.3 F (36.3 C)  97.5 F (36.4 C) 97.7 F (36.5 C)  TempSrc: Oral  Oral Oral  Resp: 18 18 18 20   Height:      Weight:   72.485 kg (159 lb 12.8 oz) 72.485 kg (159 lb 12.8 oz)  SpO2: 94% 85% 91% 95%    Intake/Output Summary (Last 24 hours) at 09/08/15 1104 Last data filed at 09/08/15 1047  Gross per 24 hour  Intake    180 ml  Output    100 ml  Net     80 ml   Filed Weights   09/07/15 1718 09/08/15 0517 09/08/15  0700  Weight: 72.5 kg (159 lb 13.3 oz) 72.485 kg (159 lb 12.8 oz) 72.485 kg (159 lb 12.8 oz)    General appearance: alert, cooperative, appears stated age and no distress Head: Normocephalic, without obvious abnormality, atraumatic Resp: Few crackles at the bases. No wheezing. No rhonchi. Cardio: regular rate and rhythm, S1, S2 normal, no murmur, click, rub or gallop GI: soft, non-tender; bowel sounds normal; no masses,  no organomegaly Extremities: Minimal edema bilateral lower extremities. Neurologic: No focal deficits  Lab Results:  Basic Metabolic Panel:  Recent Labs Lab 09/02/15 1026 09/07/15 1000 09/07/15 1758 09/08/15 0448  NA 137 137  --  139  K 4.3 4.8  --  4.8  CL 100 97*  --  99*  CO2 22 28  --  28  GLUCOSE 90 116*  --  105*  BUN 22 26*  --  36*  CREATININE 1.30* 1.68*  --  2.00*  CALCIUM 8.6 8.5*  --  8.3*  MG  --   --  1.9  --    Liver Function Tests:  Recent Labs Lab 09/07/15 1000  AST 28  ALT 21  ALKPHOS 65  BILITOT 1.4*  PROT 6.3*  ALBUMIN 2.8*   CBC:  Recent Labs Lab 09/07/15 1000 09/08/15 0448  WBC 11.9* 11.5*  NEUTROABS 9.4*  --   HGB 14.8 14.1  HCT 53.2* 51.3  MCV  89.6 90.5  PLT 67* 47*   Cardiac Enzymes:  Recent Labs Lab 09/07/15 1758 09/08/15 0448  TROPONINI 0.04* 0.19*   BNP (last 3 results)  Recent Labs  09/07/15 1657  BNP 1729.4*    CBG:  Recent Labs Lab 09/07/15 2133 09/08/15 0614  GLUCAP 108* 104*    Recent Results (from the past 240 hour(s))  Urine culture     Status: None (Preliminary result)   Collection Time: 09/07/15  1:50 PM  Result Value Ref Range Status   Specimen Description URINE, CLEAN CATCH  Final   Special Requests Normal  Final   Culture NO GROWTH < 24 HOURS  Final   Report Status PENDING  Incomplete      Studies/Results: Dg Chest 2 View  09/07/2015  CLINICAL DATA:  Weakness. EXAM: CHEST  2 VIEW COMPARISON:  01/04/2013 FINDINGS: Diffuse bilateral airspace opacities are noted.  Moderate right effusion with more confluent opacity at the right lung base. No definite effusion on the left. Heart is mildly enlarged. No acute bony abnormality. IMPRESSION: Diffuse bilateral airspace disease, likely edema. Moderate right effusion with focal right lower lobe opacity which could also reflect edema or infection. Electronically Signed   By: Rolm Baptise M.D.   On: 09/07/2015 11:09    Medications:  Scheduled: . aspirin EC  81 mg Oral QPC breakfast  . carvedilol  25 mg Oral Daily  . cefTRIAXone (ROCEPHIN)  IV  1 g Intravenous Q24H  . diltiazem  360 mg Oral Daily  . febuxostat  80 mg Oral Daily  . [START ON 09/09/2015] furosemide  40 mg Intravenous Daily  . insulin aspart  0-5 Units Subcutaneous QHS  . insulin aspart  0-9 Units Subcutaneous TID WC  . levothyroxine  88 mcg Oral QAC breakfast   Continuous:  BJY:[id number] **OR** acetaminophen, HYDROcodone-acetaminophen, ondansetron **OR** ondansetron (ZOFRAN) IV, traZODone  Assessment/Plan:  Principal Problem:   UTI (lower urinary tract infection) Active Problems:   Myeloproliferative disorder (HCC)   Atrial flutter (HCC)   Hypothyroidism   Weakness generalized   Essential hypertension, benign   Peripheral edema   Physical deconditioning   Hypoxia   Acute kidney injury ([place])   Chronic diastolic heart failure ([place])    Acute respiratory failure with hypoxia Thought to be secondary to fluid overload. Patient has improved with Lasix. Continue oxygen as needed.  Acute diastolic congestive heart failure Patient was noted to have elevated BNP. Pro-calcitonin was not significantly elevated. All of this pointed towards fluid overload. Patient was started on Lasix. Continue Lasix but decrease the dose to once a day. Continue daily weights. Strict ins and outs. Echocardiogram is pending.  Moderate right-sided pleural effusion Noted on chest x-ray. Considering the findings. This is thought to be secondary to congestive  heart failure. We will consider obtaining CT scan of chest depending on his clinical response.  Acute on chronic kidney disease stage III Patient's creatinine has gotten worse. His BUN is elevated. This is most likely secondary to aggressive diuresis. Cut back on dose of Lasix. Continue to monitor urine output.  Mildly elevated troponin Likely due to demand ischemia. Await echocardiogram.  Thrombocytopenia Patient has a known history of myeloproliferative disorder. Platelet count however, is significantly lower than what it was just a few weeks ago. No overt bleeding. Continue to monitor counts closely. Avoid heparin products.  History of hypothyroidism Continue home medications.  History of atrial flutter Rate is well controlled. Not on anticoagulation due to bleeding risk.   ADDENDUM Called  by Rn for low BP and HR. Patient responsive and communicative. Seen at bedside. BP was in 28'V systolic. HR in 50's. Patient complains of chest discomfort. EKG shows sinus brady with fist degree AVB. T wave inversions as seen on previous EKG. No new changes. Will check Trop. BP imrpoving with fluid bolus. Patient given Lasix, BB, CCB earlier today. These could have resulted in his presentation. Will monitor closely. If BP doesn't respond with one or two boluses, will transfer to stepdown. ECHo reviewed. New systolic CHF. Will consult cards in AM unless his trop is significantly high now, in which case, will call cards this evening.   DVT Prophylaxis: SCDs    Code Status: Full code  Family Communication: No family at bedside Disposition Plan: PT and OT to evaluate.    LOS: 1 day   [facility] Pager [phone] 09/08/2015, 11:04 AM  If 7PM-7AM, please contact night-coverage at [email or web address], password [facility]

## 2015-09-08 NOTE — Progress Notes (Signed)
PT Cancellation Note  Patient Details Name: Kyle Heath MRN: [id number] DOB: 1927/02/04   Cancelled Treatment:    Reason Eval/Treat Not Completed: Medical issues which prohibited therapy; Pt with elevated troponin(.19), to have repeat EKG today d/t incr QTC, will defer eval at this time and see next day or as schedule permits;   [facility] 09/08/2015, 9:52 AM

## 2015-09-09 ENCOUNTER — Inpatient Hospital Stay (HOSPITAL_COMMUNITY): Payer: Medicare Other

## 2015-09-09 ENCOUNTER — Encounter (HOSPITAL_COMMUNITY): Payer: Self-pay | Admitting: Student

## 2015-09-09 ENCOUNTER — Other Ambulatory Visit (HOSPITAL_COMMUNITY): Payer: Medicare Other

## 2015-09-09 DIAGNOSIS — Z794 Long term (current) use of insulin: Secondary | ICD-10-CM | POA: Diagnosis not present

## 2015-09-09 DIAGNOSIS — F0390 Unspecified dementia without behavioral disturbance: Secondary | ICD-10-CM | POA: Diagnosis present

## 2015-09-09 DIAGNOSIS — N179 Acute kidney failure, unspecified: Secondary | ICD-10-CM | POA: Diagnosis not present

## 2015-09-09 DIAGNOSIS — E039 Hypothyroidism, unspecified: Secondary | ICD-10-CM | POA: Diagnosis present

## 2015-09-09 DIAGNOSIS — I483 Typical atrial flutter: Secondary | ICD-10-CM | POA: Diagnosis not present

## 2015-09-09 DIAGNOSIS — G9340 Encephalopathy, unspecified: Secondary | ICD-10-CM | POA: Diagnosis present

## 2015-09-09 DIAGNOSIS — I444 Left anterior fascicular block: Secondary | ICD-10-CM | POA: Diagnosis present

## 2015-09-09 DIAGNOSIS — I480 Paroxysmal atrial fibrillation: Secondary | ICD-10-CM | POA: Diagnosis not present

## 2015-09-09 DIAGNOSIS — M109 Gout, unspecified: Secondary | ICD-10-CM | POA: Diagnosis present

## 2015-09-09 DIAGNOSIS — Z8673 Personal history of transient ischemic attack (TIA), and cerebral infarction without residual deficits: Secondary | ICD-10-CM | POA: Diagnosis not present

## 2015-09-09 DIAGNOSIS — I130 Hypertensive heart and chronic kidney disease with heart failure and stage 1 through stage 4 chronic kidney disease, or unspecified chronic kidney disease: Secondary | ICD-10-CM | POA: Diagnosis present

## 2015-09-09 DIAGNOSIS — I5043 Acute on chronic combined systolic (congestive) and diastolic (congestive) heart failure: Secondary | ICD-10-CM

## 2015-09-09 DIAGNOSIS — J90 Pleural effusion, not elsewhere classified: Secondary | ICD-10-CM | POA: Diagnosis not present

## 2015-09-09 DIAGNOSIS — J9601 Acute respiratory failure with hypoxia: Secondary | ICD-10-CM | POA: Diagnosis present

## 2015-09-09 DIAGNOSIS — I959 Hypotension, unspecified: Secondary | ICD-10-CM | POA: Diagnosis not present

## 2015-09-09 DIAGNOSIS — N183 Chronic kidney disease, stage 3 (moderate): Secondary | ICD-10-CM | POA: Diagnosis present

## 2015-09-09 DIAGNOSIS — C946 Myelodysplastic disease, not classified: Secondary | ICD-10-CM | POA: Diagnosis present

## 2015-09-09 DIAGNOSIS — D649 Anemia, unspecified: Secondary | ICD-10-CM | POA: Diagnosis present

## 2015-09-09 DIAGNOSIS — J449 Chronic obstructive pulmonary disease, unspecified: Secondary | ICD-10-CM | POA: Diagnosis present

## 2015-09-09 DIAGNOSIS — Z720 Tobacco use: Secondary | ICD-10-CM | POA: Diagnosis not present

## 2015-09-09 DIAGNOSIS — D471 Chronic myeloproliferative disease: Secondary | ICD-10-CM | POA: Diagnosis not present

## 2015-09-09 DIAGNOSIS — Z7982 Long term (current) use of aspirin: Secondary | ICD-10-CM | POA: Diagnosis not present

## 2015-09-09 DIAGNOSIS — I4581 Long QT syndrome: Secondary | ICD-10-CM | POA: Diagnosis present

## 2015-09-09 DIAGNOSIS — E1122 Type 2 diabetes mellitus with diabetic chronic kidney disease: Secondary | ICD-10-CM | POA: Diagnosis present

## 2015-09-09 DIAGNOSIS — I248 Other forms of acute ischemic heart disease: Secondary | ICD-10-CM | POA: Diagnosis present

## 2015-09-09 DIAGNOSIS — J189 Pneumonia, unspecified organism: Secondary | ICD-10-CM | POA: Diagnosis present

## 2015-09-09 DIAGNOSIS — E038 Other specified hypothyroidism: Secondary | ICD-10-CM | POA: Diagnosis not present

## 2015-09-09 DIAGNOSIS — I4892 Unspecified atrial flutter: Secondary | ICD-10-CM | POA: Diagnosis not present

## 2015-09-09 DIAGNOSIS — N400 Enlarged prostate without lower urinary tract symptoms: Secondary | ICD-10-CM | POA: Diagnosis present

## 2015-09-09 DIAGNOSIS — I351 Nonrheumatic aortic (valve) insufficiency: Secondary | ICD-10-CM | POA: Diagnosis present

## 2015-09-09 DIAGNOSIS — N390 Urinary tract infection, site not specified: Secondary | ICD-10-CM | POA: Diagnosis present

## 2015-09-09 DIAGNOSIS — I340 Nonrheumatic mitral (valve) insufficiency: Secondary | ICD-10-CM | POA: Diagnosis present

## 2015-09-09 DIAGNOSIS — I10 Essential (primary) hypertension: Secondary | ICD-10-CM | POA: Diagnosis not present

## 2015-09-09 DIAGNOSIS — D696 Thrombocytopenia, unspecified: Secondary | ICD-10-CM | POA: Diagnosis not present

## 2015-09-09 DIAGNOSIS — E440 Moderate protein-calorie malnutrition: Secondary | ICD-10-CM | POA: Diagnosis present

## 2015-09-09 LAB — PROCALCITONIN: PROCALCITONIN: 1.08 ng/mL

## 2015-09-09 LAB — BASIC METABOLIC PANEL
ANION GAP: 15 (ref 5–15)
BUN: 45 mg/dL — ABNORMAL HIGH (ref 6–20)
CALCIUM: 8.1 mg/dL — AB (ref 8.9–10.3)
CO2: 23 mmol/L (ref 22–32)
Chloride: 100 mmol/L — ABNORMAL LOW (ref 101–111)
Creatinine, Ser: 2.42 mg/dL — ABNORMAL HIGH (ref 0.61–1.24)
GFR, EST AFRICAN AMERICAN: 26 mL/min — AB (ref >60)
GFR, EST NON AFRICAN AMERICAN: 23 mL/min — AB (ref >60)
Glucose, Bld: 85 mg/dL (ref 65–99)
POTASSIUM: 6 mmol/L — AB (ref 3.5–5.1)
SODIUM: 138 mmol/L (ref 135–145)

## 2015-09-09 LAB — GLUCOSE, CAPILLARY
GLUCOSE-CAPILLARY: 98 mg/dL (ref 65–99)
GLUCOSE-CAPILLARY: 109 mg/dL — AB (ref 65–99)
Glucose-Capillary: 94 mg/dL (ref 65–99)
Glucose-Capillary: 92 mg/dL (ref 65–99)
Glucose-Capillary: 100 mg/dL — ABNORMAL HIGH (ref 65–99)

## 2015-09-09 LAB — TROPONIN I
TROPONIN I: 0.11 ng/mL — AB (ref <0.031)
Troponin I: <0.03 ng/mL (ref <0.031)

## 2015-09-09 LAB — CBC
HCT: 50.6 % (ref 39.0–52.0)
Hemoglobin: 14.3 g/dL (ref 13.0–17.0)
MCH: 25.4 pg — ABNORMAL LOW (ref 26.0–34.0)
MCHC: 28.3 g/dL — ABNORMAL LOW (ref 30.0–36.0)
MCV: 89.7 fL (ref 78.0–100.0)
PLATELETS: 52 10*3/uL — AB (ref 150–400)
RBC: 5.64 MIL/uL (ref 4.22–5.81)
RDW: 27.3 % — AB (ref 11.5–15.5)
WBC: 11.4 10*3/uL — AB (ref 4.0–10.5)

## 2015-09-09 LAB — POTASSIUM: POTASSIUM: 4.7 mmol/L (ref 3.5–5.1)

## 2015-09-09 LAB — URINE CULTURE: SPECIAL REQUESTS: NORMAL

## 2015-09-09 MED ORDER — DEXTROSE 50 % IV SOLN
1.0000 | Freq: Once | INTRAVENOUS | Status: AC
  Administered 2015-09-09: 50 mL via INTRAVENOUS
  Filled 2015-09-09: qty 50

## 2015-09-09 MED ORDER — SODIUM CHLORIDE 0.9 % IV SOLN
2.0000 g | Freq: Once | INTRAVENOUS | Status: AC
  Administered 2015-09-09: 2 g via INTRAVENOUS
  Filled 2015-09-09: qty 20

## 2015-09-09 MED ORDER — INSULIN ASPART 100 UNIT/ML IV SOLN
10.0000 [IU] | Freq: Once | INTRAVENOUS | Status: AC
  Administered 2015-09-09: 10 [IU] via INTRAVENOUS

## 2015-09-09 NOTE — Consult Note (Signed)
CARDIOLOGY CONSULT NOTE   Patient ID: Kyle Heath MRN: [id number], DOB/AGE: 1927/06/04   Admit date: 09/07/2015 Date of Consult: 09/09/2015 Reason for  Consult: CHF   Primary Physician: Foye Spurling, MD Primary Cardiologist: New - Dr. Mare Ferrari  HPI: Kyle Heath is a 81 y.o. male with past medical history of chronic diastolic CHF, HTN, atrial flutter (2014), Type 2 DM, COPD, Myeloproliferative disorder and CVA who presented to the ED on 09/07/2015 for worsening dyspnea, fatigue, and UTI.  He was recently seen in the office by Lyda Jester, PA-C on 09/02/2015 as a new patient for further evaluation of CHF. He had been experiencing increased edema and weight gain with Lasix being increased by his PCP. He still had 1+ edema at the time of his visit.    Patient is examined today with his two sisters present. Most history is provided by the patient's family for he reports "feeling good" over the past several weeks and during this admission. His main concern is "where is lunch?".  He currently resides with one of his siblings and she reports he has been having increased dyspnea with exertion over the past few weeks. She also reports noticing increased lower extremity edema and increased abdominal girth over the recent weeks. His weight averages 169 lbs and was elevated to 177 lbs around the time of admission. She had been giving him the increased Lasix dose (40mg  daily) recently prescribed at his cardiology appointment and says it helped with his lower extremity edema but not his respiratory status. He denies any recent chest pain, palpitations, orthopnea, or PND. Patient's sister reports he is up and down throughout the night (likely related to dementia) but says he does sleep flat when in bed.  While admitted, an Echo on 09/07/2015 showed EF of 25-30%, diffuse hypokinesis, mild aortic regurg, mild mitral regurg, moderately dilated LA, RA, and RV. Previous echo was in 12/2012 which showed  EF of 55-60% and Grade 2 diastolic dysfunction. CXR on 09/07/2015 showed diffuse bilateral airspace disease likely to represent edema and a moderate right effusion with focal right lower lobe opacity. BNP elevated to 1729.  He had been receiving IV Lasix 40mg  Q12H but this was decreased to Lasix 40mg  daily in the setting of elevated creatinine and hypotension. He had been on Cardizem and  Carvedilol PTA but these have been discontinued currently due to hypotension.   Problem List  Past Medical History  Diagnosis Date  . Anemia   . Hypothyroidism   . Hypertension   . Myeloproliferative disorder ([place]) 06/30/2012  . Paroxysmal atrial flutter ([place]) 2014  . Chronic combined systolic and diastolic CHF (congestive heart failure) ([place])     a. EF in 2014 55-60% b. EF in 08/2015 25-30%  . Gout   . BPH (benign prostatic hypertrophy)   . COPD (chronic obstructive pulmonary disease) ([place])   . Bone marrow disorder   . CVA (cerebral vascular accident) ([place])   . Type II diabetes mellitus ([place])   . History of blood transfusion "several"  . Dementia   . Arthritis     "mostly in joints" (09/07/2015)    Past Surgical History  Procedure Laterality Date  . Appendectomy    . Thyroid surgery    . Cyst removal neck  2009     Allergies No Known Allergies    Inpatient Medications . aspirin EC  81 mg Oral QPC breakfast  . cefTRIAXone (ROCEPHIN)  IV  1 g Intravenous Q24H  . febuxostat  80 mg  Oral Daily  . furosemide  40 mg Intravenous Daily  . insulin aspart  0-5 Units Subcutaneous QHS  . insulin aspart  0-9 Units Subcutaneous TID WC  . levothyroxine  88 mcg Oral QAC breakfast  . nicotine  14 mg Transdermal Daily    Family History Family History  Problem Relation Age of Onset  . Heart disease Neg Hx   . Heart failure Neg Hx   . Prostate cancer       Social History Social History   Social History  . Marital Status: Widowed    Spouse Name: N/A  . Number of Children: N/A  . Years of  Education: N/A   Occupational History  . Not on file.   Social History Main Topics  . Smoking status: Former Smoker    Types: Cigarettes, Cigars  . Smokeless tobacco: Current User    Types: Chew     Comment: 09/07/2015 "don't know of any cigarettes/cigar smoking"  . Alcohol Use: No  . Drug Use: No  . Sexual Activity: No   Other Topics Concern  . Not on file   Social History Narrative   Lives in a house with his daughter.  Does not use a cane or walker.   He drives (but is not supposed to).                Review of Systems General:  No chills, fever, night sweats or weight changes.  Cardiovascular:  No chest pain, orthopnea, palpitations, paroxysmal nocturnal dyspnea. Positive for dyspnea on exertion and edema. Dermatological: No rash, lesions/masses Respiratory: Positive for cough and dyspnea Urologic: No hematuria, dysuria Abdominal:   No nausea, vomiting, diarrhea, bright red blood per rectum, melena, or hematemesis Neurologic:  No visual changes, wkns, changes in mental status. All other systems reviewed and are otherwise negative except as noted above.  Physical Exam Blood pressure 108/49, pulse 58, temperature 97.8 F (36.6 C), temperature source Oral, resp. rate 19, height 5\' 8"  (1.727 m), weight 165 lb (74.844 kg), SpO2 95 %.  General: Pleasant, African American male in NAD Psych: Normal affect. Neuro: Alert and oriented X 3. Moves all extremities spontaneously. HEENT: Normal  Neck: Supple without bruits. JVD slightly elevated. Lungs:  Resp regular and unlabored, rales at bases. Heart: RRR no s3, s4, 2/6 murmur along LUSB. Abdomen: Soft, non-tender, non-distended, BS + x 4.  Extremities: No clubbing or cyanosis. 1+ edema bilaterally. DP/PT/Radials 2+ and equal bilaterally.  Labs  Recent Labs  09/08/15 0448 09/08/15 1910 09/09/15 0100 09/09/15 0530  TROPONINI 0.19* 0.06* 0.11* <0.03   Lab Results  Component Value Date   WBC 11.4* 09/09/2015   HGB  14.3 09/09/2015   HCT 50.6 09/09/2015   MCV 89.7 09/09/2015   PLT 52* 09/09/2015    Recent Labs Lab 09/07/15 1000  09/09/15 0100 09/09/15 0530  NA 137  < > 138  --   K 4.8  < > 6.0* 4.7  CL 97*  < > 100*  --   CO2 28  < > 23  --   BUN 26*  < > 45*  --   CREATININE 1.68*  < > 2.42*  --   CALCIUM 8.5*  < > 8.1*  --   PROT 6.3*  --   --   --   BILITOT 1.4*  --   --   --   ALKPHOS 65  --   --   --   ALT 21  --   --   --  AST 28  --   --   --   GLUCOSE 116*  < > 85  --   < > = values in this interval not displayed. No results found for: CHOL, HDL, LDLCALC, TRIG Lab Results  Component Value Date   DDIMER 0.57* 09/07/2015    Radiology/Studies Dg Chest 2 View: 09/07/2015  CLINICAL DATA:  Weakness. EXAM: CHEST  2 VIEW COMPARISON:  01/04/2013 FINDINGS: Diffuse bilateral airspace opacities are noted. Moderate right effusion with more confluent opacity at the right lung base. No definite effusion on the left. Heart is mildly enlarged. No acute bony abnormality. IMPRESSION: Diffuse bilateral airspace disease, likely edema. Moderate right effusion with focal right lower lobe opacity which could also reflect edema or infection. Electronically Signed   By: Rolm Baptise M.D.   On: 09/07/2015 11:09    ECG: Sinus bradycardia with rate in 50's.  LAFB. T-wave abnormalities noted. No acute changes since previous tracing.  ECHOCARDIOGRAM: 09/07/2015 Study Conclusions - Left ventricle: The cavity size was moderately dilated. Wall thickness was normal. Systolic function was severely reduced. The estimated ejection fraction was in the range of 25% to 30%. Diffuse hypokinesis. - Aortic valve: There was mild regurgitation. - Mitral valve: There was mild regurgitation. - Left atrium: The atrium was moderately dilated. - Right ventricle: The cavity size was moderately dilated. - Right atrium: The atrium was mildly dilated. - Atrial septum: No defect or patent foramen ovale was  identified.  ASSESSMENT AND PLAN  1. Acute on Chronic Combined Systolic and Diastolic CHF - An echocardiogram in February 2014 showed EF of 55-60% and Grade 2 diastolic dysfunction. Repeat Echo on 09/08/2015 showed reduced EF of 25-30% and diffuse hypokinesis, mild aortic regurg, mild mitral regurg, and moderately dilated LA, RA, and RV. BNP elevated to 1729. - Baseline weight on home scales is 169 lbs. Was 177 lbs around time of admission.  - He had been receiving IV Lasix 40mg  Q12H but this was decreased to Lasix 40mg  daily in the setting of elevated creatinine and hypotension. Had to receive fluid bolus on 09/08/2015 secondary to hypotension. - Continue to obtain daily weights and I&O's.  - Will continue with IV Lasix 40mg  daily for now with anticipation of increasing if creatinine normalizes and no further episodes of hypotension. Continue to hold Cardizem and Carvedilol for now. Would not recommend restarting Cardizem in setting of reduced EF.  2. Paroxysmal Atrial Flutter - h/o of PAF in 2014.No recent palpitations. - This patients CHA2DS2-VASc Score and unadjusted Ischemic Stroke Rate (% per year) is equal to 11.2 % stroke rate/year from a score of 7 (CHF, HTN, DM, Age > 56 x2, CVA x2). Not on anticoagulation due to concurrent myeloproliferative disorder. Platelet count at 52. Continue ASA. - resume Carvedilol for rate control once diuresis allows. No Cardizem due to low EF.  3. Acute on Chronic Stage 3 CKD - Creatinine elevated to 2.42 on 09/09/2015.  - Baseline 1.1 - 1.3.  4. Hypotension - received fluid bolus on 16/08/9603 due to systolic BP in the 54'U. - Improved and has been 104/41 - 125/57 in the past 24 hours. - continue to monitor in setting of diuresis.  Signed, Erma Heritage, PA-C 09/09/2015, 11:32 AM Pager: [phone] Agree with above assessment and plan. He had episode of marked hypotension yesterday which required IV saline bolus.  The hypotension may have  contributed to his higher creatinine today. Agree with stopping his diltiazem in view of new finding of low EF  by echo. Hold carvedilol for now in view of sinus bradycardia and soft BP. Agree with cutting back on IV lasix to just 40 mg daily and follow daily BMET. No evidence of any recurrent atrial flutter. Not a candidate for long term anticoagulation because of chronic thrombocytopenia secondary to myeloproliferative disorder. Will follow with you.

## 2015-09-09 NOTE — Progress Notes (Signed)
[facility] PROGRESS NOTE  Kyle Heath QPY:[id number] DOB: [birth year] DOA: 09/07/2015  PCP: Foye Spurling, MD  Brief HPI: 81 year old African-American male with a past medical history of diabetes, hypertension, myeloproliferative disorder, atrial fibrillation, chronic diastolic dysfunction and presented with complaints of shortness of breath, weakness. He was found to be hypoxic. He had abnormal UA. There was some concern for diastolic congestive heart failure. Patient was admitted to the hospital for further management.  Past medical history:  Past Medical History  Diagnosis Date  . Anemia   . Hypothyroidism   . Hypertension   . Myeloproliferative disorder ([place]) 06/30/2012  . Atrial flutter ([place])   . CHF (congestive heart failure) ([place])   . Gout   . BPH (benign prostatic hypertrophy)   . COPD (chronic obstructive pulmonary disease) ([place])   . Bone marrow disorder   . CVA (cerebral vascular accident) ([place])   . Type II diabetes mellitus ([place])   . History of blood transfusion "several"  . Dementia   . Arthritis     "mostly in joints" (09/07/2015)    Consultants:  Cardiology  Procedures:  Echocardiogram Study Conclusions - Left ventricle: The cavity size was moderately dilated. Wallthickness was normal. Systolic function was severely reduced. Theestimated ejection fraction was in the range of 25% to 30%.Diffuse hypokinesis. - Aortic valve: There was mild regurgitation. - Mitral valve: There was mild regurgitation. - Left atrium: The atrium was moderately dilated. - Right ventricle: The cavity size was moderately dilated. - Right atrium: The atrium was mildly dilated. - Atrial septum: No defect or patent foramen ovale was identified.  Antibiotics: None  Subjective: Patient was apparently agitated overnight. He is sitting at the side of the back. Denies any complaints. No chest pain or shortness of breath. No nausea, vomiting.   Objective: Vital  Signs  Filed Vitals:   09/09/15 0100 09/09/15 0136 09/09/15 0439 09/09/15 0519  BP: 104/52   105/41  Pulse: 64   59  Temp:  97.3 F (36.3 C) 97.8 F (36.6 C)   TempSrc:  Oral Oral   Resp: 20   20  Height:      Weight:    74.844 kg (165 lb)  SpO2: 94%   92%    Intake/Output Summary (Last 24 hours) at 09/09/15 0910 Last data filed at 09/09/15 0600  Gross per 24 hour  Intake    610 ml  Output    100 ml  Net    510 ml   Filed Weights   09/08/15 0517 09/09/15 0519  Weight: 72.485 kg (159 lb 12.8 oz) 74.844 kg (165 lb)    General appearance: alert, cooperative, appears stated age and no distress Resp: Diminished air entry at the right. Dullness to percussion. Few crackles. No wheezing. No rhonchi. Cardio: regular rate and rhythm, S1, S2 normal, no murmur, click, rub or gallop GI: soft, non-tender; bowel sounds normal; no masses,  no organomegaly Extremities: Minimal edema bilateral lower extremities. Neurologic: No focal deficits  Lab Results:  Basic Metabolic Panel:  Recent Labs Lab 09/02/15 1026 09/07/15 1000 09/07/15 1758 09/08/15 0448 09/09/15 0100 09/09/15 0530  NA 137 137  --  139 138  --   K 4.3 4.8  --  4.8 6.0* 4.7  CL 100 97*  --  99* 100*  --   CO2 22 28  --  28 23  --   GLUCOSE 90 116*  --  105* 85  --   BUN 22 26*  --  36*  45*  --   CREATININE 1.30* 1.68*  --  2.00* 2.42*  --   CALCIUM 8.6 8.5*  --  8.3* 8.1*  --   MG  --   --  1.9  --   --   --    Liver Function Tests:  Recent Labs Lab 09/07/15 1000  AST 28  ALT 21  ALKPHOS 65  BILITOT 1.4*  PROT 6.3*  ALBUMIN 2.8*   CBC:  Recent Labs Lab 09/07/15 1000 09/08/15 0448 09/09/15 0100  WBC 11.9* 11.5* 11.4*  NEUTROABS 9.4*  --   --   HGB 14.8 14.1 14.3  HCT 53.2* 51.3 50.6  MCV 89.6 90.5 89.7  PLT 67* 47* 52*   Cardiac Enzymes:  Recent Labs Lab 09/07/15 1758 09/08/15 0448 09/08/15 1910 09/09/15 0100 09/09/15 0530  TROPONINI 0.04* 0.19* 0.06* 0.11* <0.03   BNP (last 3  results)  Recent Labs  09/07/15 1657  BNP 1729.4*    CBG:  Recent Labs Lab 09/08/15 1122 09/08/15 1813 09/08/15 2157 09/09/15 0250 09/09/15 0546  GLUCAP 122* 119* 108* 98 109*    Recent Results (from the past 240 hour(s))  Urine culture     Status: None (Preliminary result)   Collection Time: 09/07/15  1:50 PM  Result Value Ref Range Status   Specimen Description URINE, CLEAN CATCH  Final   Special Requests Normal  Final   Culture TOO YOUNG TO READ  Final   Report Status PENDING  Incomplete      Studies/Results: Dg Chest 2 View  09/07/2015  CLINICAL DATA:  Weakness. EXAM: CHEST  2 VIEW COMPARISON:  01/04/2013 FINDINGS: Diffuse bilateral airspace opacities are noted. Moderate right effusion with more confluent opacity at the right lung base. No definite effusion on the left. Heart is mildly enlarged. No acute bony abnormality. IMPRESSION: Diffuse bilateral airspace disease, likely edema. Moderate right effusion with focal right lower lobe opacity which could also reflect edema or infection. Electronically Signed   By: Rolm Baptise M.D.   On: 09/07/2015 11:09    Medications:  Scheduled: . aspirin EC  81 mg Oral QPC breakfast  . cefTRIAXone (ROCEPHIN)  IV  1 g Intravenous Q24H  . febuxostat  80 mg Oral Daily  . furosemide  40 mg Intravenous Daily  . insulin aspart  0-5 Units Subcutaneous QHS  . insulin aspart  0-9 Units Subcutaneous TID WC  . levothyroxine  88 mcg Oral QAC breakfast  . nicotine  14 mg Transdermal Daily   Continuous:  WUJ:[id number] **OR** acetaminophen, HYDROcodone-acetaminophen, ondansetron **OR** ondansetron (ZOFRAN) IV, traZODone  Assessment/Plan:  Principal Problem:   UTI (lower urinary tract infection) Active Problems:   Myeloproliferative disorder ([facility])   Atrial flutter ([facility])   Hypothyroidism   Weakness generalized   Essential hypertension, benign   Peripheral edema   Physical deconditioning   Hypoxia   Acute kidney injury ([place])    Chronic diastolic heart failure ([place])    Acute respiratory failure with hypoxia Thought to be secondary to fluid overload. Patient has improved with Lasix. Continue oxygen as needed.  Acute combined systolic and diastolic congestive heart failure Patient was noted to have elevated BNP. Pro-calcitonin was not significantly elevated. All of this pointed towards fluid overload. Patient was started on Lasix. However, patient's creatinine started climbing. Dose of Lasix has been reduced. Echocardiogram shows new systolic dysfunction. Cardiology has been consulted. Patient appears to be stable currently. Continue to check daily weights and strict ins and outs.   Moderate right-sided  pleural effusion Noted on chest x-ray. Considering the findings this is thought to be secondary to congestive heart failure. We will consider obtaining CT scan of chest depending on his clinical response.  Hypotension On 10/20 patient became hypotensive and bradycardic. This was thought to be secondary to medications including his beta blocker, calcium channel blocker and Lasix. Patient had to be given a liter of normal saline. Blood pressure stabilized. His beta blocker and calcium channel blocker have been discontinued.  Acute on chronic kidney disease stage III Patient's creatinine continues to rise. His BUN is elevated. This is most likely secondary to aggressive diuresis. Dose of Lasix was reduced. Continue to monitor urine output. Repeat renal function tomorrow morning. Order renal ultrasound.  Mildly elevated troponin Likely due to demand ischemia.   Thrombocytopenia Patient has a known history of myeloproliferative disorder. Platelet count however, is significantly lower than what it was just a few weeks ago. No overt bleeding. Continue to monitor counts closely. Avoid heparin products.  History of hypothyroidism Continue home medications.  History of atrial flutter Rate is well controlled. Not on  anticoagulation due to bleeding risk secondary to thrombocytopenia.  Questionable UTI UA was not particularly impressive for urinary tract infection. Urine culture report is pending. Continue ceftriaxone for now.  DVT Prophylaxis: SCDs    Code Status: Full code  Family Communication: No family at bedside Disposition Plan: PT and OT to evaluate.    LOS: 2 days   [facility] Pager [phone] 09/09/2015, 9:10 AM  If 7PM-7AM, please contact night-coverage at [email or web address], password [facility]

## 2015-09-09 NOTE — Progress Notes (Signed)
Pt a/forgetful, no c/o pain, pt has been asking for food during shift but only takes a few bites, pt states "I'm very tired" and encouraged pt to get some rest, bed alarm on for safety, VSS, pt stable

## 2015-09-09 NOTE — Evaluation (Signed)
Physical Therapy Evaluation Patient Details Name: Kyle Heath MRN: [id number] DOB: [birth year] Today's Date: 09/09/2015   History of Present Illness  Kyle Heath is an 81 year old male adm with SOB, wekaness; PMHx: diastolic heart failure, diabetes mellitus, hypertension, history of paroxysmal A. fib flutter (not on anticoagulation due to underlying myeloproliferative disorder) COPD   Clinical Impression  Pt admitted with above diagnosis. Pt currently with functional limitations due to the deficits listed below (see PT Problem List).  Pt will benefit from skilled PT to increase their independence and safety with mobility to allow discharge to the venue listed below.  Pt cooperative with PT,  Does have some higher level blance and activity tol issues, will follow--monitor O2 sats; family concerned about pt being home alone and inquiring about SNF per RN; RN to contact CM     Follow Up Recommendations No PT follow up vs HHPT    Equipment Recommendations  None recommended by PT    Recommendations for Other Services       Precautions / Restrictions Precautions Precautions: Fall Restrictions Weight Bearing Restrictions: No      Mobility  Bed Mobility               General bed mobility comments: in chair, no assist needed per NT  Transfers Overall transfer level: Needs assistance Equipment used: None Transfers: Sit to/from Stand Sit to Stand: Supervision         General transfer comment: for safety and line management  Ambulation/Gait Ambulation/Gait assistance: Min guard Ambulation Distance (Feet): 100 Feet Assistive device: None;1 person hand held assist (occasionally pushes IV pole) Gait Pattern/deviations: Step-through pattern;Trunk flexed     General Gait Details: min/guard for safety and balance, intermittent HHA--unsteady gait while negotiating obstacles in hallway; resting O2 sats 91% on 3L,  during and after amb >95% on 3L; NT wihtout O2 d/t RN report that pt  sats drpping to 70s without O2 at rest; HR 90s during gait  Stairs            Wheelchair Mobility    Modified Rankin (Stroke Patients Only)       Balance Overall balance assessment: Needs assistance         Standing balance support: No upper extremity supported;During functional activity Standing balance-Leahy Scale: Fair Standing balance comment: pt unable to tolerate challenges, incr postural sway in static stand although able  to maintain without UE support;             High level balance activites: Turns               Pertinent Vitals/Pain Pain Assessment: No/denies pain    Home Living Family/patient expects to be discharged to:: Private residence Living Arrangements: Children Available Help at Discharge: Family;Other (Comment) Type of Home: House Home Access: Stairs to enter Entrance Stairs-Rails: None Entrance Stairs-Number of Steps: 5 Home Layout: One level Home Equipment: Bedside commode;Shower seat;Walker - standard Additional Comments: pt dtr lives with him but works night shift    Prior Function Level of Independence: Needs assistance   Gait / Transfers Assistance Needed: I household ambulator  ADL's / [place] Needed: dtr assists with meals, household tasks        Hand Dominance        Extremity/Trunk Assessment   Upper Extremity Assessment: Defer to OT evaluation           Lower Extremity Assessment: Overall WFL for tasks assessed (LEs fatigue during gait)  Communication   Communication: HOH  Cognition Arousal/Alertness: Awake/alert Behavior During Therapy: WFL for tasks assessed/performed Overall Cognitive Status: Within Functional Limits for tasks assessed                      General Comments      Exercises        Assessment/Plan    PT Assessment Patient needs continued PT services  PT Diagnosis Difficulty walking   PT Problem List Decreased balance;Decreased activity  tolerance;Decreased mobility  PT Treatment Interventions Gait training;Functional mobility training;Therapeutic activities;Therapeutic exercise;Stair training;Balance training;Patient/family education   PT Goals (Current goals can be found in the Care Plan section) Acute Rehab PT Goals Patient Stated Goal: to go home PT Goal Formulation: With patient Time For Goal Achievement: 09/16/15 Potential to Achieve Goals: Good    Frequency Min 3X/week   Barriers to discharge        Co-evaluation               End of Session Equipment Utilized During Treatment: Oxygen Activity Tolerance: Patient limited by fatigue Patient left: in chair;with call bell/phone within reach;with family/visitor present;with nursing/sitter in room Nurse Communication: Mobility status    Functional Assessment Tool Used: clinicla judgement Functional Limitation: Mobility: Walking and moving around Mobility: Walking and Moving Around Current Status (W3893): At least 1 percent but less than 20 percent impaired, limited or restricted Mobility: Walking and Moving Around Goal Status [phone]): At least 1 percent but less than 20 percent impaired, limited or restricted    Time: 1007-1032 PT Time Calculation (min) (ACUTE ONLY): 25 min   Charges:   PT Evaluation $Initial PT Evaluation Tier I: 1 Procedure PT Treatments $Gait Training: 8-22 mins   PT G Codes:   PT G-Codes **NOT FOR INPATIENT CLASS** Functional Assessment Tool Used: clinicla judgement Functional Limitation: Mobility: Walking and moving around Mobility: Walking and Moving Around Current Status (J6811): At least 1 percent but less than 20 percent impaired, limited or restricted Mobility: Walking and Moving Around Goal Status [phone]): At least 1 percent but less than 20 percent impaired, limited or restricted    [facility] 09/09/2015, 10:40 AM

## 2015-09-09 NOTE — Evaluation (Signed)
Occupational Therapy Evaluation Patient Details Name: Kyle Heath MRN: [id number] DOB: 1927-05-26 Today's Date: 09/09/2015    History of Present Illness Mr. Maready is an 81 year old male adm with SOB, wekaness; PMHx: diastolic heart failure, diabetes mellitus, hypertension, history of paroxysmal A. fib flutter (not on anticoagulation due to underlying myeloproliferative disorder) COPD    Clinical Impression   Pt demonstrates decline in function and safety with ADLs and ADL mobility with decreased balance and endurance. Pt's daughter works at night and is concerned about pt being alone during that time. PTA, pt independent with selfcare and mobility. Pt would benefit from acute OT services to address impairments to increase level of function and safety to return home    Follow Up Recommendations  No OT follow up    Equipment Recommendations  None recommended by OT    Recommendations for Other Services       Precautions / Restrictions Precautions Precautions: Fall Restrictions Weight Bearing Restrictions: No      Mobility Bed Mobility               General bed mobility comments: in chair, no assist needed per NT  Transfers Overall transfer level: Needs assistance Equipment used: None Transfers: Sit to/from Stand Sit to Stand: Supervision         General transfer comment: for safety and line management    Balance Overall balance assessment: Needs assistance Sitting-balance support: No upper extremity supported;Feet supported Sitting balance-Leahy Scale: Good     Standing balance support: No upper extremity supported;During functional activity Standing balance-Leahy Scale: Fair Standing balance comment: pt unable to tolerate challenges, incr postural sway in static stand although able  to maintain without UE support;             High level balance activites: Turns              ADL Overall ADL's : Needs assistance/impaired     Grooming:  Wash/dry hands;Wash/dry face;Standing;Supervision/safety   Upper Body Bathing: Modified independent;Sitting   Lower Body Bathing: Supervison/ safety;Set up;Sit to/from stand   Upper Body Dressing : Modified independent;Sitting   Lower Body Dressing: Sit to/from stand;Set up;Supervision/safety   Toilet Transfer: Supervision/safety;Grab Haematologist;Ambulation   Toileting- Clothing Manipulation and Hygiene: Supervision/safety   Tub/ Banker: Supervision/safety   Functional mobility during ADLs: Supervision/safety       Vision  no change in baseline   Perception Perception Perception Tested?: No   Praxis Praxis Praxis tested?: Not tested    Pertinent Vitals/Pain Pain Assessment: No/denies pain     Hand Dominance Right   Extremity/Trunk Assessment Upper Extremity Assessment Upper Extremity Assessment: Overall WFL for tasks assessed   Lower Extremity Assessment Lower Extremity Assessment: Defer to PT evaluation       Communication Communication Communication: HOH   Cognition Arousal/Alertness: Awake/alert Behavior During Therapy: WFL for tasks assessed/performed Overall Cognitive Status: Within Functional Limits for tasks assessed                     General Comments   pt very pleasant, cooperative and [place] expects to be discharged to:: Private residence Living Arrangements: Children Available Help at Discharge: Family Type of Home: House Home Access: Stairs to enter Technical brewer of Steps: 5 Entrance Stairs-Rails: None Home Layout: One level     Bathroom Shower/Tub: Teacher, early years/pre: Standard  Home Equipment: Bedside commode;Shower seat;Walker - standard   Additional Comments: pt dtr lives with him but works night shift      Prior Functioning/Environment Level of Independence: Needs assistance  Gait / Transfers Assistance Needed: I household  ambulator ADL's / Homemaking Assistance Needed: dtr assists with meals, household tasks. Pt is independent with bathing, dressing and toileting        OT Diagnosis: Generalized weakness   OT Problem List: Decreased strength;Decreased knowledge of use of DME or AE;Decreased activity tolerance;Impaired balance (sitting and/or standing)   OT Treatment/Interventions: Self-care/ADL training;Patient/family education;Therapeutic activities;DME and/or AE instruction    OT Goals(Current goals can be found in the care plan section) Acute Rehab OT Goals Patient Stated Goal: to go home OT Goal Formulation: With patient/family Time For Goal Achievement: 09/16/15 Potential to Achieve Goals: Good ADL Goals Pt Will Perform Grooming: with modified independence;standing Pt Will Perform Lower Body Bathing: with modified independence;sit to/from stand Pt Will Perform Lower Body Dressing: with modified independence;sit to/from stand Pt Will Transfer to Toilet: with modified independence;regular height toilet;ambulating;grab bars Pt Will Perform Toileting - Clothing Manipulation and hygiene: with modified independence;sit to/from stand Pt Will Perform Tub/Shower Transfer: with modified independence;ambulating;grab bars  OT Frequency: Min 2X/week   Barriers to D/C:    pt is at home alone at night when his daughter is working                     End of Glass blower/designer During Treatment: Oxygen  Activity Tolerance: Patient tolerated treatment well Patient left: in chair;with call bell/phone within reach;with chair alarm set;with family/visitor present   Time: 1005-1035 OT Time Calculation (min): 30 min Charges:  OT General Charges $OT Visit: 1 Procedure OT Evaluation $Initial OT Evaluation Tier I: 1 Procedure OT Treatments $Therapeutic Activity: 8-22 mins G-Codes:    [name] 09/09/2015, 1:18 PM

## 2015-09-09 NOTE — Progress Notes (Signed)
Pharmacist Heart Failure Core Measure Documentation  Assessment: Kyle Heath has an EF documented as 25-30% on 10/16 by ECHO.  Rationale: Heart failure patients with left ventricular systolic dysfunction (LVSD) and an EF < 40% should be prescribed an angiotensin converting enzyme inhibitor (ACEI) or angiotensin receptor blocker (ARB) at discharge unless a contraindication is documented in the medical record.  This patient is not currently on an ACEI or ARB for HF.  This note is being placed in the record in order to provide documentation that a contraindication to the use of these agents is present for this encounter.  ACE Inhibitor or Angiotensin Receptor Blocker is contraindicated (specify all that apply)  []   ACEI allergy AND ARB allergy []   Angioedema []   Moderate or severe aortic stenosis []   Hyperkalemia []   Hypotension []   Renal artery stenosis [x]   Worsening renal function, preexisting renal disease or dysfunction   Onnie Boer [facility] 09/09/2015 12:42 PM

## 2015-09-10 DIAGNOSIS — N179 Acute kidney failure, unspecified: Secondary | ICD-10-CM | POA: Insufficient documentation

## 2015-09-10 DIAGNOSIS — I4892 Unspecified atrial flutter: Secondary | ICD-10-CM

## 2015-09-10 LAB — GLUCOSE, CAPILLARY
GLUCOSE-CAPILLARY: 134 mg/dL — AB (ref 65–99)
GLUCOSE-CAPILLARY: 102 mg/dL — AB (ref 65–99)
Glucose-Capillary: 89 mg/dL (ref 65–99)
Glucose-Capillary: 119 mg/dL — ABNORMAL HIGH (ref 65–99)

## 2015-09-10 LAB — BASIC METABOLIC PANEL
Anion gap: 11 (ref 5–15)
BUN: 52 mg/dL — ABNORMAL HIGH (ref 6–20)
CHLORIDE: 97 mmol/L — AB (ref 101–111)
CO2: 26 mmol/L (ref 22–32)
CREATININE: 2.44 mg/dL — AB (ref 0.61–1.24)
Calcium: 8.2 mg/dL — ABNORMAL LOW (ref 8.9–10.3)
GFR, EST AFRICAN AMERICAN: 26 mL/min — AB (ref >60)
GFR, EST NON AFRICAN AMERICAN: 23 mL/min — AB (ref >60)
Glucose, Bld: 95 mg/dL (ref 65–99)
Potassium: 4.6 mmol/L (ref 3.5–5.1)
SODIUM: 134 mmol/L — AB (ref 135–145)

## 2015-09-10 LAB — CBC
HCT: 52.5 % — ABNORMAL HIGH (ref 39.0–52.0)
HEMOGLOBIN: 15 g/dL (ref 13.0–17.0)
MCH: 25.5 pg — ABNORMAL LOW (ref 26.0–34.0)
MCHC: 28.6 g/dL — AB (ref 30.0–36.0)
MCV: 89.3 fL (ref 78.0–100.0)
PLATELETS: 54 10*3/uL — AB (ref 150–400)
RBC: 5.88 MIL/uL — AB (ref 4.22–5.81)
RDW: 27.2 % — ABNORMAL HIGH (ref 11.5–15.5)
WBC: 10.6 10*3/uL — ABNORMAL HIGH (ref 4.0–10.5)

## 2015-09-10 NOTE — Progress Notes (Signed)
[facility] PROGRESS NOTE  Juniel Groene GTX:[id number] DOB: [birth year] DOA: 09/07/2015  PCP: Foye Spurling, MD  Brief HPI: 81 year old African-American male with a past medical history of diabetes, hypertension, myeloproliferative disorder, atrial fibrillation, chronic diastolic dysfunction and presented with complaints of shortness of breath, weakness. He was found to be hypoxic. He had abnormal UA. There was some concern for diastolic congestive heart failure. Patient was admitted to the hospital for further management.  Past medical history:  Past Medical History  Diagnosis Date  . Anemia   . Hypothyroidism   . Hypertension   . Myeloproliferative disorder ([place]) 06/30/2012  . Paroxysmal atrial flutter ([place]) 2014  . Chronic combined systolic and diastolic CHF (congestive heart failure) ([place])     a. EF in 2014 55-60% b. EF in 08/2015 25-30%  . Gout   . BPH (benign prostatic hypertrophy)   . COPD (chronic obstructive pulmonary disease) ([place])   . Bone marrow disorder   . CVA (cerebral vascular accident) ([place])   . Type II diabetes mellitus ([place])   . History of blood transfusion "several"  . Dementia   . Arthritis     "mostly in joints" (09/07/2015)    Consultants:  Cardiology  Procedures:  Echocardiogram Study Conclusions - Left ventricle: The cavity size was moderately dilated. Wallthickness was normal. Systolic function was severely reduced. Theestimated ejection fraction was in the range of 25% to 30%.Diffuse hypokinesis. - Aortic valve: There was mild regurgitation. - Mitral valve: There was mild regurgitation. - Left atrium: The atrium was moderately dilated. - Right ventricle: The cavity size was moderately dilated. - Right atrium: The atrium was mildly dilated. - Atrial septum: No defect or patent foramen ovale was identified.  Antibiotics: None  Subjective: Patient reports poor appetite. Denies any chest pain or difficulty breathing. No  lightheadedness. Denies any nausea or abdominal pain.  Objective: Vital Signs  Filed Vitals:   09/09/15 1500 09/09/15 1945 09/10/15 0517 09/10/15 0853  BP: 98/45 116/52 115/59 134/58  Pulse: 68 67 82 83  Temp: 98.4 F (36.9 C) 97.2 F (36.2 C) 98 F (36.7 C) 98.2 F (36.8 C)  TempSrc: Oral Oral Oral Oral  Resp: 18 18 18 18   Height:      Weight:   73.12 kg (161 lb 3.2 oz)   SpO2: 97% 98% 98% 93%    Intake/Output Summary (Last 24 hours) at 09/10/15 0907 Last data filed at 09/10/15 0855  Gross per 24 hour  Intake    700 ml  Output    150 ml  Net    550 ml   Filed Weights   09/08/15 0517 09/09/15 0519 09/10/15 0517  Weight: 72.485 kg (159 lb 12.8 oz) 74.844 kg (165 lb) 73.12 kg (161 lb 3.2 oz)    General appearance: alert, cooperative, appears stated age and no distress Resp: Improved air entry noted in the right lung. Few crackles. No wheezing or rhonchi.  Cardio: regular rate and rhythm, S1, S2 normal, no murmur, click, rub or gallop GI: soft, non-tender; bowel sounds normal; no masses,  no organomegaly Extremities: Minimal edema bilateral lower extremities. Neurologic: No focal deficits  Lab Results:  Basic Metabolic Panel:  Recent Labs Lab 09/07/15 1000 09/07/15 1758 09/08/15 0448 09/09/15 0100 09/09/15 0530 09/10/15 0240  NA 137  --  139 138  --  134*  K 4.8  --  4.8 6.0* 4.7 4.6  CL 97*  --  99* 100*  --  97*  CO2 28  --  28 23  --  26  GLUCOSE 116*  --  105* 85  --  95  BUN 26*  --  36* 45*  --  52*  CREATININE 1.68*  --  2.00* 2.42*  --  2.44*  CALCIUM 8.5*  --  8.3* 8.1*  --  8.2*  MG  --  1.9  --   --   --   --    Liver Function Tests:  Recent Labs Lab 09/07/15 1000  AST 28  ALT 21  ALKPHOS 65  BILITOT 1.4*  PROT 6.3*  ALBUMIN 2.8*   CBC:  Recent Labs Lab 09/07/15 1000 09/08/15 0448 09/09/15 0100 09/10/15 0240  WBC 11.9* 11.5* 11.4* 10.6*  NEUTROABS 9.4*  --   --   --   HGB 14.8 14.1 14.3 15.0  HCT 53.2* 51.3 50.6 52.5*  MCV  89.6 90.5 89.7 89.3  PLT 67* 47* 52* 54*   Cardiac Enzymes:  Recent Labs Lab 09/07/15 1758 09/08/15 0448 09/08/15 1910 09/09/15 0100 09/09/15 0530  TROPONINI 0.04* 0.19* 0.06* 0.11* <0.03   BNP (last 3 results)  Recent Labs  09/07/15 1657  BNP 1729.4*    CBG:  Recent Labs Lab 09/09/15 0546 09/09/15 1138 09/09/15 1705 09/09/15 2043 09/10/15 0631  GLUCAP 109* 94 92 100* 134*    Recent Results (from the past 240 hour(s))  Urine culture     Status: None   Collection Time: 09/07/15  1:50 PM  Result Value Ref Range Status   Specimen Description URINE, CLEAN CATCH  Final   Special Requests Normal  Final   Culture MULTIPLE SPECIES PRESENT, SUGGEST RECOLLECTION  Final   Report Status 09/09/2015 FINAL  Final      Studies/Results: US Renal  09/09/2015  CLINICAL DATA:  Acute renal failure. EXAM: RENAL / URINARY TRACT ULTRASOUND COMPLETE COMPARISON:  None. FINDINGS: Right Kidney: Length: 11.3 cm. Normal renal cortical thickness. Slight increased echogenicity suggesting medical renal disease. No hydronephrosis. Left Kidney: Length: 12.1 cm. Normal renal cortical thickness. Slight increased echogenicity suggesting medical renal disease. No hydronephrosis. Bladder: Poorly distended bladder with apparent wall thickening. Prostate gland enlargement. IMPRESSION: 1. Slight increased echogenicity of both kidneys suggesting medical renal disease. Normal renal cortical thickness and no hydronephrosis. 2. Poorly distended bladder with mild apparent wall thickening. 3. Prostate gland enlargement. Electronically Signed   By: Marijo Sanes M.D.   On: 09/09/2015 13:51    Medications:  Scheduled: . aspirin EC  81 mg Oral QPC breakfast  . cefTRIAXone (ROCEPHIN)  IV  1 g Intravenous Q24H  . febuxostat  80 mg Oral Daily  . furosemide  40 mg Intravenous Daily  . insulin aspart  0-5 Units Subcutaneous QHS  . insulin aspart  0-9 Units Subcutaneous TID WC  . levothyroxine  88 mcg Oral QAC  breakfast  . nicotine  14 mg Transdermal Daily   Continuous:  SMO:[id number] **OR** acetaminophen, HYDROcodone-acetaminophen, ondansetron **OR** ondansetron (ZOFRAN) IV, traZODone  Assessment/Plan:  Principal Problem:   UTI (lower urinary tract infection) Active Problems:   Myeloproliferative disorder ([facility])   Atrial flutter ([facility])   Hypothyroidism   Weakness generalized   Essential hypertension, benign   Peripheral edema   Physical deconditioning   Hypoxia   Acute kidney injury ([place])   Acute on chronic combined systolic (congestive) and diastolic (congestive) heart failure ([place])    Acute respiratory failure with hypoxia Thought to be secondary to fluid overload. Patient has improved with Lasix. Continue oxygen as needed.  Acute combined systolic  and diastolic congestive heart failure Patient was noted to have elevated BNP. Pro-calcitonin was not significantly elevated. All of this pointed towards fluid overload. Patient was started on Lasix. However, patient's creatinine started climbing. Dose of Lasix was reduced. Echocardiogram shows new systolic dysfunction. Cardiology is following. Patient appears to be diuresing. Continue to check daily weights and ins and outs. Repeat chest x-ray tomorrow morning.   Moderate right-sided pleural effusion Noted on chest x-ray. Considering the findings this is thought to be secondary to congestive heart failure. We will consider obtaining CT scan of chest depending on his clinical response. For now we will repeat his chest x-ray tomorrow.  Hypotension On 10/20 patient became hypotensive and bradycardic. This was thought to be secondary to medications including his beta blocker, calcium channel blocker and Lasix. Patient had to be given a liter of normal saline. Blood pressure stabilized. His beta blocker and calcium channel blocker have been discontinued.  Acute on chronic kidney disease stage III Patient's creatinine time with diuresis.  Creatinine is now stable, though still elevated. His BUN is elevated. This is most likely secondary to aggressive diuresis. Dose of Lasix was reduced. Continue to monitor urine output. Renal ultrasound shows medical renal disease. No hydronephrosis.  Mildly elevated troponin Likely due to demand ischemia.   Thrombocytopenia Patient has a known history of myeloproliferative disorder. Platelet count however, is significantly lower than what it was just a few weeks ago. No overt bleeding. Continue to monitor counts closely. Avoid heparin products.  History of hypothyroidism Continue home medications.  History of atrial flutter Rate is well controlled. Not on anticoagulation due to bleeding risk secondary to thrombocytopenia.  Questionable UTI UA was not particularly impressive for urinary tract infection. No growth on urine cultures. Okay to discontinue ceftriaxone.   DVT Prophylaxis: SCDs    Code Status: Full code  Family Communication: No family at bedside Disposition Plan: Continue current treatment. Continue to mobilize.    LOS: 3 days   [facility] Pager [phone] 09/10/2015, 9:07 AM  If 7PM-7AM, please contact night-coverage at [email or web address], password [email or web address]

## 2015-09-10 NOTE — Progress Notes (Addendum)
TELEMETRY: Reviewed telemetry pt in NSR with PACs: Filed Vitals:   09/09/15 1500 09/09/15 1945 09/10/15 0517 09/10/15 0853  BP: 98/45 116/52 115/59 134/58  Pulse: 68 67 82 83  Temp: 98.4 F (36.9 C) 97.2 F (36.2 C) 98 F (36.7 C) 98.2 F (36.8 C)  TempSrc: Oral Oral Oral Oral  Resp: 18 18 18 18   Height:      Weight:   73.12 kg (161 lb 3.2 oz)   SpO2: 97% 98% 98% 93%    Intake/Output Summary (Last 24 hours) at 09/10/15 0952 Last data filed at 09/10/15 0855  Gross per 24 hour  Intake    700 ml  Output    150 ml  Net    550 ml   Filed Weights   09/08/15 0517 09/09/15 0519 09/10/15 0517  Weight: 72.485 kg (159 lb 12.8 oz) 74.844 kg (165 lb) 73.12 kg (161 lb 3.2 oz)    Subjective "hungry" Denies chest pain or SOB  . aspirin EC  81 mg Oral QPC breakfast  . febuxostat  80 mg Oral Daily  . furosemide  40 mg Intravenous Daily  . insulin aspart  0-5 Units Subcutaneous QHS  . insulin aspart  0-9 Units Subcutaneous TID WC  . levothyroxine  88 mcg Oral QAC breakfast  . nicotine  14 mg Transdermal Daily      LABS: Basic Metabolic Panel:  Recent Labs  09/07/15 1758  09/09/15 0100 09/09/15 0530 09/10/15 0240  NA  --   < > 138  --  134*  K  --   < > 6.0* 4.7 4.6  CL  --   < > 100*  --  97*  CO2  --   < > 23  --  26  GLUCOSE  --   < > 85  --  95  BUN  --   < > 45*  --  52*  CREATININE  --   < > 2.42*  --  2.44*  CALCIUM  --   < > 8.1*  --  8.2*  MG 1.9  --   --   --   --   < > = values in this interval not displayed. Liver Function Tests:  Recent Labs  09/07/15 1000  AST 28  ALT 21  ALKPHOS 65  BILITOT 1.4*  PROT 6.3*  ALBUMIN 2.8*   No results for input(s): LIPASE, AMYLASE in the last 72 hours. CBC:  Recent Labs  09/07/15 1000  09/09/15 0100 09/10/15 0240  WBC 11.9*  < > 11.4* 10.6*  NEUTROABS 9.4*  --   --   --   HGB 14.8  < > 14.3 15.0  HCT 53.2*  < > 50.6 52.5*  MCV 89.6  < > 89.7 89.3  PLT 67*  < > 52* 54*  < > = values in this interval  not displayed. Cardiac Enzymes:  Recent Labs  09/08/15 1910 09/09/15 0100 09/09/15 0530  TROPONINI 0.06* 0.11* <0.03   BNP: No results for input(s): PROBNP in the last 72 hours. D-Dimer:  Recent Labs  09/07/15 1000  DDIMER 0.57*   Hemoglobin A1C: No results for input(s): HGBA1C in the last 72 hours. Fasting Lipid Panel: No results for input(s): CHOL, HDL, LDLCALC, TRIG, CHOLHDL, LDLDIRECT in the last 72 hours. Thyroid Function Tests:  Recent Labs  09/07/15 1758  TSH 4.570*     Radiology/Studies:  US Renal  09/09/2015  CLINICAL DATA:  Acute renal failure. EXAM: RENAL / URINARY  TRACT ULTRASOUND COMPLETE COMPARISON:  None. FINDINGS: Right Kidney: Length: 11.3 cm. Normal renal cortical thickness. Slight increased echogenicity suggesting medical renal disease. No hydronephrosis. Left Kidney: Length: 12.1 cm. Normal renal cortical thickness. Slight increased echogenicity suggesting medical renal disease. No hydronephrosis. Bladder: Poorly distended bladder with apparent wall thickening. Prostate gland enlargement. IMPRESSION: 1. Slight increased echogenicity of both kidneys suggesting medical renal disease. Normal renal cortical thickness and no hydronephrosis. 2. Poorly distended bladder with mild apparent wall thickening. 3. Prostate gland enlargement. Electronically Signed   By: Marijo Sanes M.D.   On: 09/09/2015 13:51    PHYSICAL EXAM General: Pleasant, African American male in NAD Psych: Normal affect. Neuro: Alert and oriented X 3. Moves all extremities spontaneously. HEENT: Normal Neck: Supple without bruits. JVD slightly elevated. Lungs: Resp regular and unlabored, rales in right base with diminished BS Heart: RRR no s3, s4, 2/6 murmur along LUSB. Abdomen: Soft, non-tender, non-distended, BS + x 4.  Extremities: No clubbing or cyanosis. 1+ edema bilaterally. DP/PT/Radials 2+ and equal bilaterally.  ASSESSMENT AND PLAN: 1. Acute on Chronic Combined  Systolic and Diastolic CHF - An echocardiogram in February 2014 showed EF of 55-60% and Grade 2 diastolic dysfunction. Repeat Echo on 09/08/2015 showed reduced EF of 25-30% and diffuse hypokinesis, mild aortic regurg, mild mitral regurg, and moderately dilated LA, RA, and RV. BNP elevated to 1729. - He had been receiving IV Lasix 40mg  Q12H but this was decreased to Lasix 40mg  daily in the setting of elevated creatinine and hypotension. Had to receive fluid bolus on 09/08/2015 secondary to hypotension. - Continue to obtain daily weights and I&O's.  - Will continue with IV Lasix 40mg  daily for now with anticipation of increasing if creatinine normalizes and no further episodes of hypotension. Continue to hold Cardizem and Carvedilol for now. Would not recommend restarting Cardizem in setting of reduced EF.  2. Paroxysmal Atrial Flutter - h/o of PAF in 2014.No recent palpitations. - This patients CHA2DS2-VASc Score and unadjusted Ischemic Stroke Rate (% per year) is equal to 11.2 % stroke rate/year from a score of 7 (CHF, HTN, DM, Age > 63 x2, CVA x2). Not on anticoagulation due to concurrent myeloproliferative disorder. Platelet count at 52. Continue ASA. - resume Carvedilol for rate control once diuresis allows. No Cardizem due to low EF.  3. Acute on Chronic Stage 3 CKD - Creatinine elevated to 2.42 on 09/09/2015. Appears to be stabilizing today at 2.44 - Baseline 1.1 - 1.3.  4. Hypotension - received fluid bolus on 94/05/6807 due to systolic BP in the 81'J. - Improved now  -meds adjusted as noted.     Present on Admission:  . Hypoxia . Myeloproliferative disorder ([place]) . Atrial flutter ([place]) . Hypothyroidism . Essential hypertension, benign . Peripheral edema . Physical deconditioning . UTI (lower urinary tract infection) . Acute kidney injury ([place]) . Acute on chronic combined systolic (congestive) and diastolic (congestive) heart failure ([place])  Signed, Lillis Nuttle [place],  [place] 09/10/2015 9:52 AM

## 2015-09-11 ENCOUNTER — Inpatient Hospital Stay (HOSPITAL_COMMUNITY): Payer: Medicare Other

## 2015-09-11 DIAGNOSIS — I10 Essential (primary) hypertension: Secondary | ICD-10-CM

## 2015-09-11 DIAGNOSIS — I483 Typical atrial flutter: Secondary | ICD-10-CM

## 2015-09-11 LAB — BASIC METABOLIC PANEL
Anion gap: 14 (ref 5–15)
BUN: 48 mg/dL — AB (ref 6–20)
CHLORIDE: 101 mmol/L (ref 101–111)
CO2: 24 mmol/L (ref 22–32)
CREATININE: 1.94 mg/dL — AB (ref 0.61–1.24)
Calcium: 8.3 mg/dL — ABNORMAL LOW (ref 8.9–10.3)
GFR calc Af Amer: 35 mL/min — ABNORMAL LOW (ref >60)
GFR calc non Af Amer: 30 mL/min — ABNORMAL LOW (ref >60)
Glucose, Bld: 82 mg/dL (ref 65–99)
Potassium: 4.5 mmol/L (ref 3.5–5.1)
SODIUM: 139 mmol/L (ref 135–145)

## 2015-09-11 LAB — CBC
HCT: 53.1 % — ABNORMAL HIGH (ref 39.0–52.0)
HEMOGLOBIN: 14.9 g/dL (ref 13.0–17.0)
MCH: 25.2 pg — AB (ref 26.0–34.0)
MCHC: 28.1 g/dL — AB (ref 30.0–36.0)
MCV: 89.8 fL (ref 78.0–100.0)
PLATELETS: 47 10*3/uL — AB (ref 150–400)
RBC: 5.91 MIL/uL — ABNORMAL HIGH (ref 4.22–5.81)
RDW: 27.3 % — ABNORMAL HIGH (ref 11.5–15.5)
WBC: 9 10*3/uL (ref 4.0–10.5)

## 2015-09-11 LAB — GLUCOSE, CAPILLARY
Glucose-Capillary: 114 mg/dL — ABNORMAL HIGH (ref 65–99)
Glucose-Capillary: 93 mg/dL (ref 65–99)
Glucose-Capillary: 90 mg/dL (ref 65–99)
Glucose-Capillary: 83 mg/dL (ref 65–99)

## 2015-09-11 MED ORDER — CARVEDILOL 6.25 MG PO TABS
6.2500 mg | ORAL_TABLET | Freq: Two times a day (BID) | ORAL | Status: DC
  Administered 2015-09-11 – 2015-09-16 (×10): 6.25 mg via ORAL
  Filled 2015-09-11 (×9): qty 1

## 2015-09-11 MED ORDER — LORAZEPAM 0.5 MG PO TABS
0.5000 mg | ORAL_TABLET | Freq: Once | ORAL | Status: AC | PRN
Start: 2015-09-11 — End: 2015-09-11
  Administered 2015-09-11: 0.5 mg via ORAL
  Filled 2015-09-11: qty 1

## 2015-09-11 MED ORDER — CARVEDILOL 3.125 MG PO TABS: 3.1250 mg | ORAL_TABLET | Freq: Two times a day (BID) | ORAL | Status: DC

## 2015-09-11 NOTE — Progress Notes (Signed)
[facility] PROGRESS NOTE  Kyle Heath KJZ:[id number] DOB: [birth year] DOA: 09/07/2015  PCP: Foye Spurling, MD  Brief HPI: 81 year old African-American male with a past medical history of diabetes, hypertension, myeloproliferative disorder, atrial fibrillation, chronic diastolic dysfunction and presented with complaints of shortness of breath, weakness. He was found to be hypoxic. He had abnormal UA. There was some concern for diastolic congestive heart failure. Patient was admitted to the hospital for further management.  Past medical history:  Past Medical History  Diagnosis Date  . Anemia   . Hypothyroidism   . Hypertension   . Myeloproliferative disorder ([place]) 06/30/2012  . Paroxysmal atrial flutter ([place]) 2014  . Chronic combined systolic and diastolic CHF (congestive heart failure) ([place])     a. EF in 2014 55-60% b. EF in 08/2015 25-30%  . Gout   . BPH (benign prostatic hypertrophy)   . COPD (chronic obstructive pulmonary disease) ([place])   . Bone marrow disorder   . CVA (cerebral vascular accident) ([place])   . Type II diabetes mellitus ([place])   . History of blood transfusion "several"  . Dementia   . Arthritis     "mostly in joints" (09/07/2015)    Consultants:  Cardiology  Procedures:  Echocardiogram Study Conclusions - Left ventricle: The cavity size was moderately dilated. Wallthickness was normal. Systolic function was severely reduced. Theestimated ejection fraction was in the range of 25% to 30%.Diffuse hypokinesis. - Aortic valve: There was mild regurgitation. - Mitral valve: There was mild regurgitation. - Left atrium: The atrium was moderately dilated. - Right ventricle: The cavity size was moderately dilated. - Right atrium: The atrium was mildly dilated. - Atrial septum: No defect or patent foramen ovale was identified.  Antibiotics: None  Subjective: Patient denies any complaints. No chest pain or shortness of breath. Per nurse, patient has  had episodes of confusion on and off.   Objective: Vital Signs  Filed Vitals:   09/10/15 0853 09/10/15 1344 09/10/15 2010 09/11/15 0424  BP: 134/58 129/81 140/70 112/52  Pulse: 83 86 90 94  Temp: 98.2 F (36.8 C) 97.6 F (36.4 C) 97.8 F (36.6 C) 97.4 F (36.3 C)  TempSrc: Oral Axillary Axillary Axillary  Resp: 18 20 18 18   Height:      Weight:    72.666 kg (160 lb 3.2 oz)  SpO2: 93% 91% 92% 94%    Intake/Output Summary (Last 24 hours) at 09/11/15 0905 Last data filed at 09/11/15 0600  Gross per 24 hour  Intake    460 ml  Output    600 ml  Net   -140 ml   Filed Weights   09/09/15 0519 09/10/15 0517 09/11/15 0424  Weight: 74.844 kg (165 lb) 73.12 kg (161 lb 3.2 oz) 72.666 kg (160 lb 3.2 oz)    General appearance: alert, cooperative, appears stated age and no distress Resp: Improved air entry noted in the right lung. Few crackles. No wheezing or rhonchi. Dullness to percussion. Cardio: regular rate and rhythm, S1, S2 normal, no murmur, click, rub or gallop GI: soft, non-tender; bowel sounds normal; no masses,  no organomegaly Extremities: Minimal edema bilateral lower extremities. Neurologic: No focal deficits  Lab Results:  Basic Metabolic Panel:  Recent Labs Lab 09/07/15 1000 09/07/15 1758 09/08/15 0448 09/09/15 0100 09/09/15 0530 09/10/15 0240 09/11/15 0343  NA 137  --  139 138  --  134* 139  K 4.8  --  4.8 6.0* 4.7 4.6 4.5  CL 97*  --  99* 100*  --  97* 101  CO2 28  --  28 23  --  26 24  GLUCOSE 116*  --  105* 85  --  95 82  BUN 26*  --  36* 45*  --  52* 48*  CREATININE 1.68*  --  2.00* 2.42*  --  2.44* 1.94*  CALCIUM 8.5*  --  8.3* 8.1*  --  8.2* 8.3*  MG  --  1.9  --   --   --   --   --    Liver Function Tests:  Recent Labs Lab 09/07/15 1000  AST 28  ALT 21  ALKPHOS 65  BILITOT 1.4*  PROT 6.3*  ALBUMIN 2.8*   CBC:  Recent Labs Lab 09/07/15 1000 09/08/15 0448 09/09/15 0100 09/10/15 0240 09/11/15 0343  WBC 11.9* 11.5* 11.4* 10.6*  9.0  NEUTROABS 9.4*  --   --   --   --   HGB 14.8 14.1 14.3 15.0 14.9  HCT 53.2* 51.3 50.6 52.5* 53.1*  MCV 89.6 90.5 89.7 89.3 89.8  PLT 67* 47* 52* 54* 47*   Cardiac Enzymes:  Recent Labs Lab 09/07/15 1758 09/08/15 0448 09/08/15 1910 09/09/15 0100 09/09/15 0530  TROPONINI 0.04* 0.19* 0.06* 0.11* <0.03   BNP (last 3 results)  Recent Labs  09/07/15 1657  BNP 1729.4*    CBG:  Recent Labs Lab 09/10/15 0631 09/10/15 1134 09/10/15 1636 09/10/15 2054 09/11/15 0619  GLUCAP 134* 102* 89 119* 114*    Recent Results (from the past 240 hour(s))  Urine culture     Status: None   Collection Time: 09/07/15  1:50 PM  Result Value Ref Range Status   Specimen Description URINE, CLEAN CATCH  Final   Special Requests Normal  Final   Culture MULTIPLE SPECIES PRESENT, SUGGEST RECOLLECTION  Final   Report Status 09/09/2015 FINAL  Final      Studies/Results: US Renal  09/09/2015  CLINICAL DATA:  Acute renal failure. EXAM: RENAL / URINARY TRACT ULTRASOUND COMPLETE COMPARISON:  None. FINDINGS: Right Kidney: Length: 11.3 cm. Normal renal cortical thickness. Slight increased echogenicity suggesting medical renal disease. No hydronephrosis. Left Kidney: Length: 12.1 cm. Normal renal cortical thickness. Slight increased echogenicity suggesting medical renal disease. No hydronephrosis. Bladder: Poorly distended bladder with apparent wall thickening. Prostate gland enlargement. IMPRESSION: 1. Slight increased echogenicity of both kidneys suggesting medical renal disease. Normal renal cortical thickness and no hydronephrosis. 2. Poorly distended bladder with mild apparent wall thickening. 3. Prostate gland enlargement. Electronically Signed   By: Marijo Sanes M.D.   On: 09/09/2015 13:51    Medications:  Scheduled: . aspirin EC  81 mg Oral QPC breakfast  . carvedilol  3.125 mg Oral BID WC  . febuxostat  80 mg Oral Daily  . furosemide  40 mg Intravenous Daily  . insulin aspart  0-5 Units  Subcutaneous QHS  . insulin aspart  0-9 Units Subcutaneous TID WC  . levothyroxine  88 mcg Oral QAC breakfast  . nicotine  14 mg Transdermal Daily   Continuous:  PYP:[id number] **OR** acetaminophen, HYDROcodone-acetaminophen, ondansetron **OR** ondansetron (ZOFRAN) IV, traZODone  Assessment/Plan:  Principal Problem:   UTI (lower urinary tract infection) Active Problems:   Myeloproliferative disorder (H[facility])   Atrial flutter (HCC)   Hypothyroidism   Weakness generalized   Essential hypertension, benign   Peripheral edema   Physical deconditioning   Hypoxia   Acute kidney injury ([place])   Acute on chronic combined systolic (congestive) and diastolic (congestive) heart failure ([place])   ARF (acute  renal failure) (H[facility])    Acute respiratory failure with hypoxia Thought to be secondary to fluid overload. Patient has improved with Lasix. Continue oxygen as needed.  Acute combined systolic and diastolic congestive heart failure Patient was noted to have elevated BNP. Pro-calcitonin was not significantly elevated. All of this pointed towards fluid overload. Patient was started on Lasix. However, patient's creatinine started climbing. Dose of Lasix was reduced. Echocardiogram shows new systolic dysfunction. Cardiology is following. Patient appears to be diuresing. His weight is decreasing. Continue to check daily weights and ins and outs. Low-dose Coreg initiated by cardiology.  Moderate right-sided pleural effusion Noted on chest x-ray. Considering the findings this is thought to be secondary to congestive heart failure. Chest x-ray was repeated today. Once again, bilateral pleural effusions were noted. Right greater than left. Older x-rays have been reviewed. He's had pleural effusions since 2013. Patient does not appear to be overtly symptomatic from same. Effusions are most likely due to CHF. No clear indication to do thoracentesis. Will need repeat films in a few weeks.  Hypotension On  10/20 patient became hypotensive and bradycardic. This was thought to be secondary to medications including his beta blocker, calcium channel blocker and Lasix. Patient had to be given a liter of normal saline. Blood pressure stabilized. His beta blocker and calcium channel blocker were discontinued. Coreg initiated today.  Acute on chronic kidney disease stage III Patient's creatinine increased with diuresis. Creatinine is now improved, though still elevated. His BUN is elevated. This is most likely secondary to aggressive diuresis. Dose of Lasix was reduced. Continue to monitor urine output. Renal ultrasound shows medical renal disease. No hydronephrosis. Will need to tolerate higher creatinine in this patient.  Mildly elevated troponin Likely due to demand ischemia. Do not anticipate any further workup for same.  Thrombocytopenia Patient has a known history of myeloproliferative disorder. Platelet count however, is significantly lower than what it was just a few weeks ago. No overt bleeding. Continue to monitor counts closely. Avoid heparin products.  History of hypothyroidism Continue home medications.  History of atrial flutter Rate is well controlled. Not on anticoagulation due to bleeding risk secondary to thrombocytopenia.  Questionable UTI UA was not particularly impressive for urinary tract infection. No growth on urine cultures. He was initially placed on ceftriaxone. This has been discontinued.   Mild acute encephalopathy in the setting of baseline dementia Patient has history of dementia at baseline. Sometimes he gets agitated. This is likely secondary to new environment. Reorient daily.  DVT Prophylaxis: SCDs    Code Status: Full code  Family Communication: No family at bedside Disposition Plan: Continue current treatment. Continue to mobilize. Anticipate discharge in 1-2 days.    LOS: 4 days   [facility] Pager [phone] 09/11/2015, 9:05  AM  If 7PM-7AM, please contact night-coverage at [email or web address], password [email or web address]

## 2015-09-11 NOTE — Progress Notes (Signed)
TELEMETRY: Reviewed telemetry pt in NSR with PACs- short runs of atrial tachycardia Filed Vitals:   09/10/15 0853 09/10/15 1344 09/10/15 2010 09/11/15 0424  BP: 134/58 129/81 140/70 112/52  Pulse: 83 86 90 94  Temp: 98.2 F (36.8 C) 97.6 F (36.4 C) 97.8 F (36.6 C) 97.4 F (36.3 C)  TempSrc: Oral Axillary Axillary Axillary  Resp: 18 20 18 18   Height:      Weight:    160 lb 3.2 oz (72.666 kg)  SpO2: 93% 91% 92% 94%    Intake/Output Summary (Last 24 hours) at 09/11/15 0851 Last data filed at 09/11/15 0600  Gross per 24 hour  Intake    580 ml  Output    600 ml  Net    -20 ml   Filed Weights   09/09/15 0519 09/10/15 0517 09/11/15 0424  Weight: 165 lb (74.844 kg) 161 lb 3.2 oz (73.12 kg) 160 lb 3.2 oz (72.666 kg)    Subjective "hungry" Denies chest pain or SOB, up in chair  . aspirin EC  81 mg Oral QPC breakfast  . febuxostat  80 mg Oral Daily  . furosemide  40 mg Intravenous Daily  . insulin aspart  0-5 Units Subcutaneous QHS  . insulin aspart  0-9 Units Subcutaneous TID WC  . levothyroxine  88 mcg Oral QAC breakfast  . nicotine  14 mg Transdermal Daily      LABS: Basic Metabolic Panel:  Recent Labs  09/10/15 0240 09/11/15 0343  NA 134* 139  K 4.6 4.5  CL 97* 101  CO2 26 24  GLUCOSE 95 82  BUN 52* 48*  CREATININE 2.44* 1.94*  CALCIUM 8.2* 8.3*   Liver Function Tests: No results for input(s): AST, ALT, ALKPHOS, BILITOT, PROT, ALBUMIN in the last 72 hours. No results for input(s): LIPASE, AMYLASE in the last 72 hours. CBC:  Recent Labs  09/10/15 0240 09/11/15 0343  WBC 10.6* 9.0  HGB 15.0 14.9  HCT 52.5* 53.1*  MCV 89.3 89.8  PLT 54* 47*   Cardiac Enzymes:  Recent Labs  09/08/15 1910 09/09/15 0100 09/09/15 0530  TROPONINI 0.06* 0.11* <0.03   BNP: No results for input(s): PROBNP in the last 72 hours. D-Dimer: No results for input(s): DDIMER in the last 72 hours. Hemoglobin A1C: No results for input(s): HGBA1C in the last 72  hours. Fasting Lipid Panel: No results for input(s): CHOL, HDL, LDLCALC, TRIG, CHOLHDL, LDLDIRECT in the last 72 hours. Thyroid Function Tests: No results for input(s): TSH, T4TOTAL, T3FREE, THYROIDAB in the last 72 hours.  Invalid input(s): FREET3   Radiology/Studies:  US Renal  09/09/2015  CLINICAL DATA:  Acute renal failure. EXAM: RENAL / URINARY TRACT ULTRASOUND COMPLETE COMPARISON:  None. FINDINGS: Right Kidney: Length: 11.3 cm. Normal renal cortical thickness. Slight increased echogenicity suggesting medical renal disease. No hydronephrosis. Left Kidney: Length: 12.1 cm. Normal renal cortical thickness. Slight increased echogenicity suggesting medical renal disease. No hydronephrosis. Bladder: Poorly distended bladder with apparent wall thickening. Prostate gland enlargement. IMPRESSION: 1. Slight increased echogenicity of both kidneys suggesting medical renal disease. Normal renal cortical thickness and no hydronephrosis. 2. Poorly distended bladder with mild apparent wall thickening. 3. Prostate gland enlargement. Electronically Signed   By: Marijo Sanes M.D.   On: 09/09/2015 13:51   EXAM: CHEST 2 VIEW  COMPARISON: 09/07/2015  FINDINGS: Cardiomegaly with mild interstitial edema. Moderate right and small left pleural effusions. No pneumothorax.  No interval change.  IMPRESSION: Right mid lobe mild interstitial edema.  Moderate  right and small left pleural effusions.   Electronically Signed By: Julian Hy M.D. On: 09/11/2015 09:58  PHYSICAL EXAM General: Pleasant, African American male in NAD Psych: Normal affect. Neuro: Alert and oriented X 3. Moves all extremities spontaneously. HEENT: Normal Neck: Supple without bruits. JVD slightly elevated. Lungs: Resp regular and unlabored, rales in right base with diminished BS Heart: RRR no s3, s4, 2/6 murmur along LUSB. Abdomen: Soft, non-tender, non-distended, BS + x 4.  Extremities: No clubbing or  cyanosis. 1+ edema bilaterally. DP/PT/Radials 2+ and equal bilaterally.  ASSESSMENT AND PLAN: 1. Acute on Chronic Combined Systolic and Diastolic CHF - An echocardiogram in February 2014 showed EF of 55-60% and Grade 2 diastolic dysfunction. Repeat Echo on 09/08/2015 showed reduced EF of 25-30% and diffuse hypokinesis, mild aortic regurg, mild mitral regurg, and moderately dilated LA, RA, and RV. BNP elevated to 1729. - He had been receiving IV Lasix 40mg  Q12H but this was decreased to Lasix 40mg  daily in the setting of elevated creatinine and hypotension. Had to receive fluid bolus on 09/08/2015 secondary to hypotension. - Continue to obtain daily weights and I&O's.  - Will continue with IV Lasix 40mg  daily for now with anticipation of increasing if creatinine normalizes and no further episodes of hypotension. CXR shows persistent bilateral effusions that are chronic. Weight really hasn't changed much with diuresis.  Would not recommend restarting Cardizem in setting of reduced EF.  2. Paroxysmal Atrial Flutter - h/o of PAF in 2014.No recent palpitations. - This patients CHA2DS2-VASc Score and unadjusted Ischemic Stroke Rate (% per year) is equal to 11.2 % stroke rate/year from a score of 7 (CHF, HTN, DM, Age > 61 x2, CVA x2). Not on anticoagulation due to concurrent myeloproliferative disorder. Platelet count at 52. Continue ASA. - resume Carvedilol for rate control . No Cardizem due to low EF.  3. Acute on Chronic Stage 3 CKD - Creatinine elevated to 2.42 on 09/09/2015. Appears to be stabilizing today at 1.9 - Baseline 1.1 - 1.3.  4. Hypotension - received fluid bolus on 87/86/7672 due to systolic BP in the 09'O. - Improved now  -meds adjusted as noted.  Plan: Resume Coreg at low dose.   Present on Admission:  . Hypoxia . Myeloproliferative disorder ([place]) . Atrial flutter ([place]) . Hypothyroidism . Essential hypertension, benign . Peripheral edema . Physical deconditioning . UTI  (lower urinary tract infection) . Acute kidney injury ([place]) . Acute on chronic combined systolic (congestive) and diastolic (congestive) heart failure (HCC)  Signed, Erlene Quan, PA-CFACC 09/11/2015 8:51 AM  Patient seen and examined and history reviewed. Agree with above findings and plan. Renal function a little better today. Really hasn't diuresed much on lasix once a day but limited by increased creatinine. CXR shows chronic bilateral effusions. Will increase coreg to 6.25 mg bid.   Peter Martinique, [place] 09/11/2015 10:52 AM

## 2015-09-12 DIAGNOSIS — I480 Paroxysmal atrial fibrillation: Secondary | ICD-10-CM

## 2015-09-12 LAB — CBC
HCT: 50.5 % (ref 39.0–52.0)
Hemoglobin: 14.1 g/dL (ref 13.0–17.0)
MCH: 25 pg — ABNORMAL LOW (ref 26.0–34.0)
MCHC: 27.9 g/dL — ABNORMAL LOW (ref 30.0–36.0)
MCV: 89.5 fL (ref 78.0–100.0)
PLATELETS: 23 10*3/uL — AB (ref 150–400)
RBC: 5.64 MIL/uL (ref 4.22–5.81)
RDW: 26.8 % — AB (ref 11.5–15.5)
WBC: 9.3 10*3/uL (ref 4.0–10.5)

## 2015-09-12 LAB — GLUCOSE, CAPILLARY
GLUCOSE-CAPILLARY: 76 mg/dL (ref 65–99)
GLUCOSE-CAPILLARY: 96 mg/dL (ref 65–99)
Glucose-Capillary: 107 mg/dL — ABNORMAL HIGH (ref 65–99)
Glucose-Capillary: 115 mg/dL — ABNORMAL HIGH (ref 65–99)
Glucose-Capillary: 89 mg/dL (ref 65–99)

## 2015-09-12 LAB — BASIC METABOLIC PANEL
Anion gap: 12 (ref 5–15)
BUN: 46 mg/dL — AB (ref 6–20)
CALCIUM: 8.1 mg/dL — AB (ref 8.9–10.3)
CO2: 25 mmol/L (ref 22–32)
Chloride: 104 mmol/L (ref 101–111)
Creatinine, Ser: 1.95 mg/dL — ABNORMAL HIGH (ref 0.61–1.24)
GFR calc Af Amer: 34 mL/min — ABNORMAL LOW (ref >60)
GFR, EST NON AFRICAN AMERICAN: 30 mL/min — AB (ref >60)
GLUCOSE: 86 mg/dL (ref 65–99)
Potassium: 4.4 mmol/L (ref 3.5–5.1)
Sodium: 141 mmol/L (ref 135–145)

## 2015-09-12 NOTE — Progress Notes (Addendum)
Physical Therapy Treatment Patient Details Name: Kyle Heath MRN: [id number] DOB: 02-Jul-1927 Today's Date: 09/12/2015    History of Present Illness Kyle Heath is an 81 year old male adm with SOB, wekaness; PMHx: diastolic heart failure, diabetes mellitus, hypertension, history of paroxysmal A. fib flutter (not on anticoagulation due to underlying myeloproliferative disorder) COPD     PT Comments    Pt will be home with his daughter per his friend in the room and will need to be supervised for mobility entirely initially.  Pt will be seeing HHPT as well as he continues to be unsafe and unable to maneuver alone.  Follow Up Recommendations  Home health PT     Equipment Recommendations  None recommended by PT    Recommendations for Other Services       Precautions / Restrictions Precautions Precautions: Fall Restrictions Weight Bearing Restrictions: No    Mobility  Bed Mobility Overal bed mobility: Needs Assistance Bed Mobility: Sit to Supine       Sit to supine: Mod assist   General bed mobility comments: seated eob, able to demonstrate sit/stand to scoot hips back on bed as he was seated very close to eob upon arrival into room, educated on scooting back to prevent falls, verbalized understanding  Transfers Overall transfer level: Needs assistance Equipment used: None Transfers: Sit to/from [employer] Transfers Sit to Stand: Supervision;Min guard Stand pivot transfers: Supervision;Min guard       General transfer comment: declined mobility this session  Ambulation/Gait Ambulation/Gait assistance: Min guard Ambulation Distance (Feet): 50 Feet Assistive device: 1 person hand held assist Gait Pattern/deviations: Step-through pattern;Trunk flexed;Wide base of support Gait velocity: reduced Gait velocity interpretation: Below normal speed for age/gender General Gait Details: min guard for safety and to cue obstacle clearance   Stairs             Wheelchair Mobility    Modified Rankin (Stroke Patients Only)       Balance Overall balance assessment: Needs assistance Sitting-balance support: Feet supported Sitting balance-Leahy Scale: Good     Standing balance support: Single extremity supported Standing balance-Leahy Scale: Fair Standing balance comment: fair- dynamic balance                     Cognition Arousal/Alertness: Awake/alert Behavior During Therapy: WFL for tasks assessed/performed Overall Cognitive Status: Within Functional Limits for tasks assessed                      Exercises      General Comments        Pertinent Vitals/Pain Pain Assessment: No/denies pain (complaining of being cold)    Home Living                      Prior Function            PT Goals (current goals can now be found in the care plan section) Acute Rehab PT Goals Patient Stated Goal: to go home Progress towards PT goals: Progressing toward goals    Frequency  Min 3X/week    PT Plan Current plan remains appropriate    Co-evaluation             End of Session Equipment Utilized During Treatment: Gait belt Activity Tolerance: Patient limited by fatigue Patient left: in bed;with call bell/phone within reach;with bed alarm set;with family/visitor present     Time: 5284-1324 PT Time Calculation (min) (ACUTE ONLY): 21 min  Charges:  $Gait  Training: 8-22 mins                    G Codes:      Kyle Heath 2015-09-20, 6:54 PM   Kyle Heath, PT MS Acute Rehab Dept. Number: [facility] [id number] and [place] [phone]

## 2015-09-12 NOTE — Progress Notes (Signed)
   Subjective: No CP  Breathing is fair Objective: Filed Vitals:   09/11/15 0424 09/11/15 1100 09/11/15 2200 09/12/15 0600  BP: 112/52 118/73 142/82 124/83  Pulse: 94 92 113 110  Temp: 97.4 F (36.3 C) 97.7 F (36.5 C) 97.6 F (36.4 C)   TempSrc: Axillary Oral Oral Oral  Resp: 18 18 18 18   Height:      Weight: 160 lb 3.2 oz (72.666 kg)   163 lb 4.8 oz (74.072 kg)  SpO2: 94% 95% 97%    Weight change: 3 lb 1.6 oz (1.406 kg)  Intake/Output Summary (Last 24 hours) at 09/12/15 0854 Last data filed at 09/12/15 0600  Gross per 24 hour  Intake    840 ml  Output    150 ml  Net    690 ml   Net I/O  2.19 L Positive   General: Alert, awake, oriented x3, in no acute distress Neck:  JVP is mildly increased   Heart: Regular rate and rhythm, without murmurs, rubs, gallops.  Lungs: Crackes at bases. Exemities:  Tr edema.   Neuro: Grossly intact, nonfocal.   Lab Results: Results for orders placed or performed during the hospital encounter of 09/07/15 (from the past 24 hour(s))  Glucose, capillary     Status: None   Collection Time: 09/11/15 11:28 AM  Result Value Ref Range   Glucose-Capillary 93 65 - 99 mg/dL  Glucose, capillary     Status: None   Collection Time: 09/11/15  4:37 PM  Result Value Ref Range   Glucose-Capillary 90 65 - 99 mg/dL  Glucose, capillary     Status: None   Collection Time: 09/11/15 10:00 PM  Result Value Ref Range   Glucose-Capillary 83 65 - 99 mg/dL    Studies/Results: No results found. CXR  CLINICAL DATA: Pleural effusion, CHF  EXAM: CHEST 2 VIEW  COMPARISON: 09/07/2015  FINDINGS: Cardiomegaly with mild interstitial edema. Moderate right and small left pleural effusions. No pneumothorax.  No interval change.  IMPRESSION: Right mid lobe mild interstitial edema.  Moderate right and small left pleural effusions.    Medications: REviewed     @PROBHOSP @  1  Acute on chronic systolic/diastolic CHF  LVEF 25 to 99%   Volume is  still up   Getting IV lasix now.   Follow    2  PAF  CHADS2VASc 7  NOT an aniticoag candidate.   3  CKD stage III  Cr is a little better     LOS: 5 days   Dorris Carnes 09/12/2015, 8:54 AM

## 2015-09-12 NOTE — Progress Notes (Signed)
Occupational Therapy Treatment Patient Details Name: Kyle Heath MRN: [id number] DOB: 1927-10-19 Today's Date: 09/12/2015    History of present illness Kyle Heath is an 81 year old male adm with SOB, wekaness; PMHx: diastolic heart failure, diabetes mellitus, hypertension, history of paroxysmal A. fib flutter (not on anticoagulation due to underlying myeloproliferative disorder) COPD    OT comments  Pt. Able to demonstrate safe LB dressing techniques while seated eob..  Declined oob this session reporting he had been up all day, also had a visitor present.  Will continue to follow acutely.       No OT follow up    Equipment Recommendations  None recommended by OT    Recommendations for Other Services      Precautions / Restrictions Precautions Precautions: Fall       Mobility Bed Mobility               General bed mobility comments: seated eob, able to demonstrate sit/stand to scoot hips back on bed as he was seated very close to eob upon arrival into room, educated on scooting back to prevent falls, verbalized understanding  Transfers Overall transfer level: Needs assistance     Sit to Stand: Supervision         General transfer comment: declined mobility this session    Balance Overall balance assessment: Needs assistance   Sitting balance-Leahy Scale: Good                             ADL Overall ADL's : Needs assistance/impaired                     Lower Body Dressing: Sitting/lateral leans;Set up Lower Body Dressing Details (indicate cue type and reason): pt. able to cross L/R LE over knees to complete don/doff of socks               General ADL Comments: able to return demo of LB dressing, declined all other oob therapy stating he had been up all day, also had a visitor present      Kyle Heath   Behavior During Therapy: [facility] for tasks  assessed/performed Overall Cognitive Status: Within Functional Limits for tasks assessed                       Extremity/Trunk Assessment               Exercises     Shoulder Instructions       General Comments      Pertinent Vitals/ Pain       Pain Assessment: No/denies pain  Home Living                                          Prior Functioning/Environment              Frequency Min 2X/week     Progress Toward Goals  OT Goals(current goals can now be found in the care plan section)  Progress towards OT goals: Progressing toward goals     Plan Discharge plan remains appropriate    Co-evaluation  End of Session     Activity Tolerance Patient tolerated treatment well   Patient Left in bed;with call bell/phone within reach;with family/visitor present   Nurse Communication          Time: 9379-0240 OT Time Calculation (min): 9 min  Charges: OT General Charges $OT Visit: 1 Procedure OT Treatments $Self Care/Home Management : 8-22 mins  Kyle Heath, COTA/L 09/12/2015, 4:22 PM

## 2015-09-12 NOTE — Care Management Important Message (Signed)
Important Message  Patient Details  Name: Kyle Heath MRN: [id number] Date of Birth: 28-Jul-1927   Medicare Important Message Given:  Yes-second notification given    Delorse Lek 09/12/2015, 12:18 PM

## 2015-09-12 NOTE — Progress Notes (Signed)
CRITICAL VALUE ALERT  Critical value received:  Platelets 23   Date of notification:  09/12/2015   Time of notification:  9798  Critical value read back:Yes.    Nurse who received alert:  Maurene Capes  MD notified (1st page):  [phone]  Time of first page:  [phone]  Responding MD:  Dr. Maryland Pink  Time MD responded:  [phone]

## 2015-09-12 NOTE — Progress Notes (Addendum)
[facility] PROGRESS NOTE  Jedediah Noda JJK:[id number] DOB: 1927/06/13 DOA: 09/07/2015  PCP: Foye Spurling, MD  Brief HPI: 81 year old African-American male with a past medical history of diabetes, hypertension, myeloproliferative disorder, atrial fibrillation, chronic diastolic dysfunction and presented with complaints of shortness of breath, weakness. He was found to be hypoxic. He had abnormal UA. There was some concern for diastolic congestive heart failure. Patient was admitted to the hospital for further management.  Past medical history:  Past Medical History  Diagnosis Date  . Anemia   . Hypothyroidism   . Hypertension   . Myeloproliferative disorder ([place]) 06/30/2012  . Paroxysmal atrial flutter ([place]) 2014  . Chronic combined systolic and diastolic CHF (congestive heart failure) ([place])     a. EF in 2014 55-60% b. EF in 08/2015 25-30%  . Gout   . BPH (benign prostatic hypertrophy)   . COPD (chronic obstructive pulmonary disease) ([place])   . Bone marrow disorder   . CVA (cerebral vascular accident) ([place])   . Type II diabetes mellitus ([place])   . History of blood transfusion "several"  . Dementia   . Arthritis     "mostly in joints" (09/07/2015)    Consultants:  Cardiology  Procedures:  Echocardiogram Study Conclusions - Left ventricle: The cavity size was moderately dilated. Wallthickness was normal. Systolic function was severely reduced. Theestimated ejection fraction was in the range of 25% to 30%.Diffuse hypokinesis. - Aortic valve: There was mild regurgitation. - Mitral valve: There was mild regurgitation. - Left atrium: The atrium was moderately dilated. - Right ventricle: The cavity size was moderately dilated. - Right atrium: The atrium was mildly dilated. - Atrial septum: No defect or patent foramen ovale was identified.  Antibiotics: None  Subjective: Patient continues to be the same. Denies any complaints. His daughter was at bedside.    Objective: Vital Signs  Filed Vitals:   09/11/15 0424 09/11/15 1100 09/11/15 2200 09/12/15 0600  BP: 112/52 118/73 142/82 124/83  Pulse: 94 92 113 110  Temp: 97.4 F (36.3 C) 97.7 F (36.5 C) 97.6 F (36.4 C)   TempSrc: Axillary Oral Oral Oral  Resp: 18 18 18 18   Height:      Weight: 72.666 kg (160 lb 3.2 oz)   74.072 kg (163 lb 4.8 oz)  SpO2: 94% 95% 97%     Intake/Output Summary (Last 24 hours) at 09/12/15 1156 Last data filed at 09/12/15 0600  Gross per 24 hour  Intake    600 ml  Output    150 ml  Net    450 ml   Filed Weights   09/10/15 0517 09/11/15 0424 09/12/15 0600  Weight: 73.12 kg (161 lb 3.2 oz) 72.666 kg (160 lb 3.2 oz) 74.072 kg (163 lb 4.8 oz)    General appearance: alert, cooperative, appears stated age and no distress Resp: Air entry continues to improve. No crackles or wheezing heard today. Diminished at the right base with dullness to percussion. Cardio: regular rate and rhythm, S1, S2 normal, no murmur, click, rub or gallop GI: soft, non-tender; bowel sounds normal; no masses,  no organomegaly Extremities: Minimal edema bilateral lower extremities. Neurologic: No focal deficits  Lab Results:  Basic Metabolic Panel:  Recent Labs Lab 09/07/15 1758 09/08/15 0448 09/09/15 0100 09/09/15 0530 09/10/15 0240 09/11/15 0343 09/12/15 0320  NA  --  139 138  --  134* 139 141  K  --  4.8 6.0* 4.7 4.6 4.5 4.4  CL  --  99* 100*  --  97* 101 104  CO2  --  28 23  --  26 24 25   GLUCOSE  --  105* 85  --  95 82 86  BUN  --  36* 45*  --  52* 48* 46*  CREATININE  --  2.00* 2.42*  --  2.44* 1.94* 1.95*  CALCIUM  --  8.3* 8.1*  --  8.2* 8.3* 8.1*  MG 1.9  --   --   --   --   --   --    Liver Function Tests:  Recent Labs Lab 09/07/15 1000  AST 28  ALT 21  ALKPHOS 65  BILITOT 1.4*  PROT 6.3*  ALBUMIN 2.8*   CBC:  Recent Labs Lab 09/07/15 1000 09/08/15 0448 09/09/15 0100 09/10/15 0240 09/11/15 0343 09/12/15 0320  WBC 11.9* 11.5* 11.4* 10.6*  9.0 9.3  NEUTROABS 9.4*  --   --   --   --   --   HGB 14.8 14.1 14.3 15.0 14.9 14.1  HCT 53.2* 51.3 50.6 52.5* 53.1* 50.5  MCV 89.6 90.5 89.7 89.3 89.8 89.5  PLT 67* 47* 52* 54* 47* 23*   Cardiac Enzymes:  Recent Labs Lab 09/07/15 1758 09/08/15 0448 09/08/15 1910 09/09/15 0100 09/09/15 0530  TROPONINI 0.04* 0.19* 0.06* 0.11* <0.03   BNP (last 3 results)  Recent Labs  09/07/15 1657  BNP 1729.4*    CBG:  Recent Labs Lab 09/11/15 0619 09/11/15 1128 09/11/15 1637 09/11/15 2200 09/12/15 0601  GLUCAP 114* 93 90 83 76    Recent Results (from the past 240 hour(s))  Urine culture     Status: None   Collection Time: 09/07/15  1:50 PM  Result Value Ref Range Status   Specimen Description URINE, CLEAN CATCH  Final   Special Requests Normal  Final   Culture MULTIPLE SPECIES PRESENT, SUGGEST RECOLLECTION  Final   Report Status 09/09/2015 FINAL  Final      Studies/Results: Dg Chest 2 View  09/11/2015  CLINICAL DATA:  Pleural effusion, CHF EXAM: CHEST  2 VIEW COMPARISON:  09/07/2015 FINDINGS: Cardiomegaly with mild interstitial edema. Moderate right and small left pleural effusions. No pneumothorax. No interval change. IMPRESSION: Right mid lobe mild interstitial edema. Moderate right and small left pleural effusions. Electronically Signed   By: Julian Hy M.D.   On: 09/11/2015 09:58    Medications:  Scheduled: . aspirin EC  81 mg Oral QPC breakfast  . carvedilol  6.25 mg Oral BID WC  . febuxostat  80 mg Oral Daily  . furosemide  40 mg Intravenous Daily  . insulin aspart  0-5 Units Subcutaneous QHS  . insulin aspart  0-9 Units Subcutaneous TID WC  . levothyroxine  88 mcg Oral QAC breakfast  . nicotine  14 mg Transdermal Daily   Continuous:  JME:[id number] **OR** acetaminophen, HYDROcodone-acetaminophen, ondansetron **OR** ondansetron (ZOFRAN) IV, traZODone  Assessment/Plan:  Principal Problem:   Acute on chronic combined systolic (congestive) and  diastolic (congestive) heart failure (HCC) Active Problems:   Myeloproliferative disorder (HCC)   Atrial flutter (HCC)   Hypothyroidism   Weakness generalized   Essential hypertension, benign   Peripheral edema   Physical deconditioning   Hypoxia   UTI (lower urinary tract infection)   Acute kidney injury ([place])   ARF (acute renal failure) (HCC)    Acute respiratory failure with hypoxia Thought to be secondary to fluid overload. Patient has improved with Lasix. Continue oxygen as needed.  Acute combined systolic and diastolic congestive heart failure Patient  was noted to have elevated BNP. Pro-calcitonin was not significantly elevated. All of this pointed towards fluid overload. Patient was started on Lasix. However, patient's creatinine started climbing. Dose of Lasix was reduced. Echocardiogram shows new systolic dysfunction. Cardiology is following. Patient appears to be diuresing. His weight is decreasing. Continue to check daily weights and ins and outs. Low-dose Coreg initiated by cardiology. Creatinine remains stable. Chest x-ray done yesterday did reveal some pulmonary edema. Continue current treatment.  Moderate right-sided pleural effusion Noted on chest x-ray. Considering the findings this is thought to be secondary to congestive heart failure. Chest x-ray was repeated today. Once again, bilateral pleural effusions were noted. Right greater than left. Older x-rays have been reviewed. He's had pleural effusions since 2013. Patient does not appear to be overtly symptomatic from same. Effusions are most likely due to CHF. No clear indication to do thoracentesis. Will need repeat films in a few weeks.  Hypotension On 10/20 patient became hypotensive and bradycardic. This was thought to be secondary to medications including his beta blocker, calcium channel blocker and Lasix. Patient had to be given a liter of normal saline. Blood pressure stabilized. His beta blocker and calcium  channel blocker were discontinued. Coreg initiated today.  Acute on chronic kidney disease stage III Patient's creatinine increased with diuresis. Creatinine is now improved, though still elevated. His BUN is elevated. Acute elevation was most likely secondary to aggressive diuresis. Dose of Lasix was reduced. Continue to monitor urine output. Renal ultrasound shows medical renal disease. No hydronephrosis. Will need to tolerate higher creatinine in this patient.  Mildly elevated troponin Likely due to demand ischemia. Do not anticipate any further workup for same.  Thrombocytopenia Patient has a known history of myeloproliferative disorder. Platelet count however, is significantly lower than what it was just a few weeks ago. No overt bleeding. Counts continue to decrease. No overt bleeding. Continue to monitor for now. Avoid heparin products.  History of hypothyroidism Continue home medications.  History of atrial flutter Rate is well controlled. Not on anticoagulation due to bleeding risk secondary to thrombocytopenia.  Questionable UTI UA was not particularly impressive for urinary tract infection. No growth on urine cultures. He was initially placed on ceftriaxone. This has been discontinued.   Mild acute encephalopathy in the setting of baseline dementia Patient has history of dementia at baseline. Sometimes he gets agitated. This is likely secondary to new environment. Reorient daily.  DVT Prophylaxis: SCDs    Code Status: Full code  Family Communication: Discussed with his daughter in detail. Explained to her that if patient continues to decline or doesn't improve as anticipated,  He may benefit from a palliative medicine input, although we will continue active treatment for now. Disposition Plan: Continue current treatment. Continue to mobilize. Daughter requesting placement. PT and OT have seen the patient, however, did not recommend short-term rehabilitation. Social worker to  discuss further with family.    LOS: 5 days   [facility] Pager [phone] 09/12/2015, 11:56 AM  If 7PM-7AM, please contact night-coverage at [email or web address], password [facility]

## 2015-09-13 ENCOUNTER — Ambulatory Visit: Payer: Medicare Other | Admitting: Cardiology

## 2015-09-13 DIAGNOSIS — D696 Thrombocytopenia, unspecified: Secondary | ICD-10-CM

## 2015-09-13 DIAGNOSIS — D471 Chronic myeloproliferative disease: Secondary | ICD-10-CM

## 2015-09-13 LAB — CBC
HEMATOCRIT: 50.4 % (ref 39.0–52.0)
HEMOGLOBIN: 13.9 g/dL (ref 13.0–17.0)
MCH: 24.6 pg — AB (ref 26.0–34.0)
MCHC: 27.6 g/dL — AB (ref 30.0–36.0)
MCV: 89.4 fL (ref 78.0–100.0)
Platelets: 15 10*3/uL — CL (ref 150–400)
RBC: 5.64 MIL/uL (ref 4.22–5.81)
RDW: 26.6 % — ABNORMAL HIGH (ref 11.5–15.5)
WBC: 9.7 10*3/uL (ref 4.0–10.5)

## 2015-09-13 LAB — BASIC METABOLIC PANEL
Anion gap: 13 (ref 5–15)
BUN: 52 mg/dL — AB (ref 6–20)
CHLORIDE: 103 mmol/L (ref 101–111)
CO2: 24 mmol/L (ref 22–32)
CREATININE: 2.2 mg/dL — AB (ref 0.61–1.24)
Calcium: 8.2 mg/dL — ABNORMAL LOW (ref 8.9–10.3)
GFR calc Af Amer: 30 mL/min — ABNORMAL LOW (ref >60)
GFR calc non Af Amer: 26 mL/min — ABNORMAL LOW (ref >60)
GLUCOSE: 105 mg/dL — AB (ref 65–99)
POTASSIUM: 4.4 mmol/L (ref 3.5–5.1)
Sodium: 140 mmol/L (ref 135–145)

## 2015-09-13 LAB — GLUCOSE, CAPILLARY
GLUCOSE-CAPILLARY: 88 mg/dL (ref 65–99)
GLUCOSE-CAPILLARY: 87 mg/dL (ref 65–99)
GLUCOSE-CAPILLARY: 103 mg/dL — AB (ref 65–99)
Glucose-Capillary: 129 mg/dL — ABNORMAL HIGH (ref 65–99)

## 2015-09-13 LAB — URIC ACID: URIC ACID, SERUM: 12.1 mg/dL — AB (ref 4.4–7.6)

## 2015-09-13 MED ORDER — ISOSORBIDE MONONITRATE ER 30 MG PO TB24
15.0000 mg | ORAL_TABLET | Freq: Every day | ORAL | Status: DC
  Administered 2015-09-13 – 2015-09-16 (×4): 15 mg via ORAL
  Filled 2015-09-13 (×5): qty 1

## 2015-09-13 MED ORDER — HYDRALAZINE HCL 10 MG PO TABS
10.0000 mg | ORAL_TABLET | Freq: Three times a day (TID) | ORAL | Status: DC
  Administered 2015-09-13 – 2015-09-16 (×9): 10 mg via ORAL
  Filled 2015-09-13 (×9): qty 1

## 2015-09-13 NOTE — Care Management Note (Signed)
Case Management Note  Patient Details  Name: Kyle Heath MRN: [id number] Date of Birth: January 15, 1927  Subjective/Objective:      Admitted with CHF              Action/Plan: Patient stays with his daughter, is agreeable to [facility] services, [place] choices offered, [name] (daughter) chose [facility] for [facility] services. Mary with [facility] called for arrangements. He has a rolling walker and 3:1 at home/ needs a tub bench also. Has private insurance with BCBS with prescription drug coverage. Private sitter list given to the daughter for additional support at home.  Expected Discharge Date:     Possibly 09/15/2015             Expected Discharge Plan:  [place]  Discharge planning Services  CM Consult  Choice offered to:  Adult Children  DME Arranged:  Tub bench DME Agency:  [place] Arranged:  RN, Disease Management, PT, OT, Nurse's Aide [place] Agency:  [facility]  Status of Service:  In process, will continue to follow  Medicare Important Message Given:  Yes-second notification given  Royston Bake [place] ,MHA,BSN [phone]  09/13/2015, 10:04 AM

## 2015-09-13 NOTE — Progress Notes (Signed)
[facility] PROGRESS NOTE  Arth Nicastro NOB:[id number] DOB: [birth year] DOA: 09/07/2015  PCP: Foye Spurling, MD  Brief HPI: 81 year old African-American male with a past medical history of diabetes, hypertension, myeloproliferative disorder, atrial fibrillation, chronic diastolic dysfunction and presented with complaints of shortness of breath, weakness. He was found to be hypoxic. He had abnormal UA. There was some concern for diastolic congestive heart failure. Patient was admitted to the hospital for further management. Patient was seen by cardiology for a new systolic dysfunction. Patient's platelet count started decreasing.  Past medical history:  Past Medical History  Diagnosis Date  . Anemia   . Hypothyroidism   . Hypertension   . Myeloproliferative disorder ([place]) 06/30/2012  . Paroxysmal atrial flutter ([place]) 2014  . Chronic combined systolic and diastolic CHF (congestive heart failure) ([place])     a. EF in 2014 55-60% b. EF in 08/2015 25-30%  . Gout   . BPH (benign prostatic hypertrophy)   . COPD (chronic obstructive pulmonary disease) ([place])   . Bone marrow disorder   . CVA (cerebral vascular accident) ([place])   . Type II diabetes mellitus ([place])   . History of blood transfusion "several"  . Dementia   . Arthritis     "mostly in joints" (09/07/2015)    Consultants:  Cardiology Phone discussion with Dr. [name]  Procedures:  Echocardiogram Study Conclusions - Left ventricle: The cavity size was moderately dilated. Wallthickness was normal. Systolic function was severely reduced. Theestimated ejection fraction was in the range of 25% to 30%.Diffuse hypokinesis. - Aortic valve: There was mild regurgitation. - Mitral valve: There was mild regurgitation. - Left atrium: The atrium was moderately dilated. - Right ventricle: The cavity size was moderately dilated. - Right atrium: The atrium was mildly dilated. - Atrial septum: No defect or patent foramen ovale was  identified.  Antibiotics: None  Subjective: Patient sleepy this morning. Easily arousable. Denies complaints. Noted to be lying flat on the bed.   Objective: Vital Signs  Filed Vitals:   09/12/15 0600 09/12/15 1310 09/12/15 1959 09/13/15 0405  BP: 124/83 125/60 124/48 124/62  Pulse: 110 90 81 92  Temp:  98.2 F (36.8 C) 98.2 F (36.8 C) 97.3 F (36.3 C)  TempSrc: Oral Oral Oral Axillary  Resp: 18 21 18 21   Height:      Weight: 74.072 kg (163 lb 4.8 oz)   72.258 kg (159 lb 4.8 oz)  SpO2:  100% 98% 96%    Intake/Output Summary (Last 24 hours) at 09/13/15 0758 Last data filed at 09/13/15 0238  Gross per 24 hour  Intake    240 ml  Output    200 ml  Net     40 ml   Filed Weights   09/11/15 0424 09/12/15 0600 09/13/15 0405  Weight: 72.666 kg (160 lb 3.2 oz) 74.072 kg (163 lb 4.8 oz) 72.258 kg (159 lb 4.8 oz)    General appearance: alert, cooperative, appears stated age and no distress Resp: Air entry continues to improve. No crackles or wheezing heard today. Diminished at the right base with dullness to percussion. Cardio: regular rate and rhythm, S1, S2 normal, no murmur, click, rub or gallop GI: soft, non-tender; bowel sounds normal; no masses,  no organomegaly Extremities: Minimal edema bilateral lower extremities. Neurologic: No focal deficits  Lab Results:  Basic Metabolic Panel:  Recent Labs Lab 09/07/15 1758  09/09/15 0100 09/09/15 0530 09/10/15 0240 09/11/15 0343 09/12/15 0320 09/13/15 0450  NA  --   < > 138  --  134* 139 141 140  K  --   < > 6.0* 4.7 4.6 4.5 4.4 4.4  CL  --   < > 100*  --  97* 101 104 103  CO2  --   < > 23  --  26 24 25 24   GLUCOSE  --   < > 85  --  95 82 86 105*  BUN  --   < > 45*  --  52* 48* 46* 52*  CREATININE  --   < > 2.42*  --  2.44* 1.94* 1.95* 2.20*  CALCIUM  --   < > 8.1*  --  8.2* 8.3* 8.1* 8.2*  MG 1.9  --   --   --   --   --   --   --   < > = values in this interval not displayed. Liver Function Tests:  Recent  Labs Lab 09/07/15 1000  AST 28  ALT 21  ALKPHOS 65  BILITOT 1.4*  PROT 6.3*  ALBUMIN 2.8*   CBC:  Recent Labs Lab 09/07/15 1000  09/09/15 0100 09/10/15 0240 09/11/15 0343 09/12/15 0320 09/13/15 0450  WBC 11.9*  < > 11.4* 10.6* 9.0 9.3 PENDING  NEUTROABS 9.4*  --   --   --   --   --   --   HGB 14.8  < > 14.3 15.0 14.9 14.1 13.9  HCT 53.2*  < > 50.6 52.5* 53.1* 50.5 50.4  MCV 89.6  < > 89.7 89.3 89.8 89.5 89.4  PLT 67*  < > 52* 54* 47* 23* PENDING  < > = values in this interval not displayed. Cardiac Enzymes:  Recent Labs Lab 09/07/15 1758 09/08/15 0448 09/08/15 1910 09/09/15 0100 09/09/15 0530  TROPONINI 0.04* 0.19* 0.06* 0.11* <0.03   BNP (last 3 results)  Recent Labs  09/07/15 1657  BNP 1729.4*    CBG:  Recent Labs Lab 09/12/15 1308 09/12/15 1618 09/12/15 2004 09/12/15 2051 09/13/15 0613  GLUCAP 107* 115* 89 96 88    Recent Results (from the past 240 hour(s))  Urine culture     Status: None   Collection Time: 09/07/15  1:50 PM  Result Value Ref Range Status   Specimen Description URINE, CLEAN CATCH  Final   Special Requests Normal  Final   Culture MULTIPLE SPECIES PRESENT, SUGGEST RECOLLECTION  Final   Report Status 09/09/2015 FINAL  Final      Studies/Results: No results found.  Medications:  Scheduled: . aspirin EC  81 mg Oral QPC breakfast  . carvedilol  6.25 mg Oral BID WC  . febuxostat  80 mg Oral Daily  . furosemide  40 mg Intravenous Daily  . insulin aspart  0-5 Units Subcutaneous QHS  . insulin aspart  0-9 Units Subcutaneous TID WC  . levothyroxine  88 mcg Oral QAC breakfast  . nicotine  14 mg Transdermal Daily   Continuous:  NUU:[id number] **OR** acetaminophen, HYDROcodone-acetaminophen, ondansetron **OR** ondansetron (ZOFRAN) IV, traZODone  Assessment/Plan:  Principal Problem:   Acute on chronic combined systolic (congestive) and diastolic (congestive) heart failure (HCC) Active Problems:   Myeloproliferative  disorder (HCC)   Atrial flutter (HCC)   Hypothyroidism   Weakness generalized   Essential hypertension, benign   Peripheral edema   Physical deconditioning   Hypoxia   UTI (lower urinary tract infection)   Acute kidney injury ([place])   ARF (acute renal failure) (HCC)    Acute respiratory failure with hypoxia Thought to be secondary to fluid overload. Patient  has improved with Lasix. Continue oxygen as needed.  Acute combined systolic and diastolic congestive heart failure Patient was noted to have elevated BNP. Pro-calcitonin was not significantly elevated. All of this pointed towards fluid overload. Patient was started on Lasix. However, patient's creatinine started climbing. Dose of Lasix was reduced. Echocardiogram shows new systolic dysfunction. Cardiology is following. Patient appears to be diuresing. His weight is decreasing. Continue to check daily weights and ins and outs. Low-dose Coreg initiated by cardiology. Creatinine has climbed today. Anticipate cardiology will change his Lasix to oral.   Thrombocytopenia Patient has a known history of myeloproliferative disorder. Platelet count however, is significantly lower than what it was just a few weeks ago. Counts continue to decrease. No overt bleeding. Discussed with Dr. [name] today. He states to transfuse if counts drop below 10 or if he starts bleeding. He requests that we call him if it drops below 10. At this time he feels his counts are low due to acute illness. Avoid heparin products.  Moderate right-sided pleural effusion Noted on chest x-ray. Considering the above this is thought to be secondary to congestive heart failure. Chest x-ray was repeated. Once again, bilateral pleural effusions were noted. Right greater than left. Older x-rays have been reviewed. He's had pleural effusions since 2013. Patient does not appear to be overtly symptomatic from same. Effusions are most likely due to CHF. No clear indication to do  thoracentesis. Will need repeat films in a few weeks.  Hypotension On 10/20 patient became hypotensive and bradycardic. This was thought to be secondary to medications including his beta blocker, calcium channel blocker and Lasix. Patient had to be given a liter of normal saline. Blood pressure stabilized. His beta blocker and calcium channel blocker were discontinued. Coreg was reinitiated.  Acute on chronic kidney disease stage III Increase in creatinine is due to diuresis. Continue to monitor urine output. Renal ultrasound shows medical renal disease. No hydronephrosis. Will need to tolerate higher creatinine in this patient.  Mildly elevated troponin Likely due to demand ischemia. Do not anticipate any further workup for same.  History of hypothyroidism Continue home medications.  History of atrial flutter Rate is well controlled. Not on anticoagulation due to bleeding risk secondary to thrombocytopenia.  Questionable UTI UA was not particularly impressive for urinary tract infection. No growth on urine cultures. He was initially placed on ceftriaxone. This has been discontinued.   Mild acute encephalopathy in the setting of baseline dementia Patient has history of dementia at baseline. Sometimes he gets agitated. This is likely secondary to new environment. Reorient daily.  DVT Prophylaxis: SCDs    Code Status: Full code  Family Communication: No family at bedside today. Skull is with his daughter yesterday. Disposition Plan: Continue current treatment. Continue to mobilize. Daughter requesting placement. However, PT and OT have seen the patient, and did not recommend short-term rehabilitation. Daughter is agreeable to home health.    LOS: 6 days   [facility] Pager [phone] 09/13/2015, 7:58 AM  If 7PM-7AM, please contact night-coverage at [email or web address], password [facility]

## 2015-09-13 NOTE — Progress Notes (Signed)
CRITICAL VALUE ALERT  Critical value received:  Platelets 15  Date of notification:  09/13/15  Time of notification:  0810  Critical value read back:Yes.    Nurse who received alert:  S. Chrisandra Carota  MD notified (1st page): Curly Rim  Time of first page:  [phone]  MD notified (2nd page):  Time of second page:  Responding MD:  Curly Rim   Time MD responded:  [phone]

## 2015-09-13 NOTE — Progress Notes (Signed)
Subjective: Breathing is better  No CP  Feet hurt   Objective: Filed Vitals:   09/12/15 0600 09/12/15 1310 09/12/15 1959 09/13/15 0405  BP: 124/83 125/60 124/48 124/62  Pulse: 110 90 81 92  Temp:  98.2 F (36.8 C) 98.2 F (36.8 C) 97.3 F (36.3 C)  TempSrc: Oral Oral Oral Axillary  Resp: 18 21 18 21   Height:      Weight: 163 lb 4.8 oz (74.072 kg)   159 lb 4.8 oz (72.258 kg)  SpO2:  100% 98% 96%   Weight change: -4 lb (-1.814 kg)  Intake/Output Summary (Last 24 hours) at 09/13/15 0935 Last data filed at 09/13/15 0238  Gross per 24 hour  Intake    240 ml  Output    200 ml  Net     40 ml    General: Alert, awake, oriented x3, in no acute distress Neck:  JVP is normal Heart: Regular rate and rhythm, without murmurs, rubs, gallops.  Lungs: Clear to auscultation.  No rales or wheezes. Exemities:  No edema.  R gr toe is red   Neuro: Grossly intact, nonfocal.  Tele  SR  PACs   Lab Results: Results for orders placed or performed during the hospital encounter of 09/07/15 (from the past 24 hour(s))  Glucose, capillary     Status: Abnormal   Collection Time: 09/12/15  1:08 PM  Result Value Ref Range   Glucose-Capillary 107 (H) 65 - 99 mg/dL  Glucose, capillary     Status: Abnormal   Collection Time: 09/12/15  4:18 PM  Result Value Ref Range   Glucose-Capillary 115 (H) 65 - 99 mg/dL  Glucose, capillary     Status: None   Collection Time: 09/12/15  8:04 PM  Result Value Ref Range   Glucose-Capillary 89 65 - 99 mg/dL  Glucose, capillary     Status: None   Collection Time: 09/12/15  8:51 PM  Result Value Ref Range   Glucose-Capillary 96 65 - 99 mg/dL  CBC     Status: Abnormal   Collection Time: 09/13/15  4:50 AM  Result Value Ref Range   WBC 9.7 4.0 - 10.5 K/uL   RBC 5.64 4.22 - 5.81 MIL/uL   Hemoglobin 13.9 13.0 - 17.0 g/dL   HCT 50.4 39.0 - 52.0 %   MCV 89.4 78.0 - 100.0 fL   MCH 24.6 (L) 26.0 - 34.0 pg   MCHC 27.6 (L) 30.0 - 36.0 g/dL   RDW 26.6 (H) 11.5 - 15.5 %    Platelets 15 (LL) 150 - 400 K/uL  Basic metabolic panel     Status: Abnormal   Collection Time: 09/13/15  4:50 AM  Result Value Ref Range   Sodium 140 135 - 145 mmol/L   Potassium 4.4 3.5 - 5.1 mmol/L   Chloride 103 101 - 111 mmol/L   CO2 24 22 - 32 mmol/L   Glucose, Bld 105 (H) 65 - 99 mg/dL   BUN 52 (H) 6 - 20 mg/dL   Creatinine, Ser 2.20 (H) 0.61 - 1.24 mg/dL   Calcium 8.2 (L) 8.9 - 10.3 mg/dL   GFR calc non Af Amer 26 (L) >60 mL/min   GFR calc Af Amer 30 (L) >60 mL/min   Anion gap 13 5 - 15  Glucose, capillary     Status: None   Collection Time: 09/13/15  6:13 AM  Result Value Ref Range   Glucose-Capillary 88 65 - 99 mg/dL   Comment 1 Notify RN  Studies/Results: No results found.  Medications  REviewed    @PROBHOSP @  1  Acute on chronic systolic/diastolic CHF  LVEF 25 to 32%  Diuresing  Would hold lasix today  I am not sure how much more he can lose.   Have started imdur and hydralazine  Follow BP Recomm continued medical Rx    2  PAF  Not an anticoag clinic due to myeloproliferative disorder    3.  Stage III CKD  Follow Cr in AM    4.  Foot pain  Check uric acid even though on uloriic  LOS: 6 days   Dorris Carnes 09/13/2015, 9:35 AM

## 2015-09-14 DIAGNOSIS — E440 Moderate protein-calorie malnutrition: Secondary | ICD-10-CM | POA: Insufficient documentation

## 2015-09-14 DIAGNOSIS — J189 Pneumonia, unspecified organism: Secondary | ICD-10-CM | POA: Insufficient documentation

## 2015-09-14 LAB — CBC
HCT: 52.1 % — ABNORMAL HIGH (ref 39.0–52.0)
HEMOGLOBIN: 14.3 g/dL (ref 13.0–17.0)
MCH: 24.6 pg — AB (ref 26.0–34.0)
MCHC: 27.4 g/dL — AB (ref 30.0–36.0)
MCV: 89.7 fL (ref 78.0–100.0)
Platelets: 13 10*3/uL — CL (ref 150–400)
RBC: 5.81 MIL/uL (ref 4.22–5.81)
RDW: 26.7 % — ABNORMAL HIGH (ref 11.5–15.5)
WBC: 8.6 10*3/uL (ref 4.0–10.5)

## 2015-09-14 LAB — GLUCOSE, CAPILLARY
Glucose-Capillary: 118 mg/dL — ABNORMAL HIGH (ref 65–99)
Glucose-Capillary: 120 mg/dL — ABNORMAL HIGH (ref 65–99)
Glucose-Capillary: 114 mg/dL — ABNORMAL HIGH (ref 65–99)
Glucose-Capillary: 148 mg/dL — ABNORMAL HIGH (ref 65–99)
Glucose-Capillary: 110 mg/dL — ABNORMAL HIGH (ref 65–99)

## 2015-09-14 LAB — BASIC METABOLIC PANEL
Anion gap: 15 (ref 5–15)
BUN: 57 mg/dL — AB (ref 6–20)
CHLORIDE: 101 mmol/L (ref 101–111)
CO2: 26 mmol/L (ref 22–32)
CREATININE: 2.27 mg/dL — AB (ref 0.61–1.24)
Calcium: 8.5 mg/dL — ABNORMAL LOW (ref 8.9–10.3)
GFR calc Af Amer: 29 mL/min — ABNORMAL LOW (ref >60)
GFR calc non Af Amer: 25 mL/min — ABNORMAL LOW (ref >60)
GLUCOSE: 105 mg/dL — AB (ref 65–99)
POTASSIUM: 4.3 mmol/L (ref 3.5–5.1)
SODIUM: 142 mmol/L (ref 135–145)

## 2015-09-14 LAB — TYPE AND SCREEN
ABO/RH(D): O POS
ANTIBODY SCREEN: NEGATIVE

## 2015-09-14 LAB — ABO/RH: ABO/RH(D): O POS

## 2015-09-14 MED ORDER — PRO-STAT SUGAR FREE PO LIQD
30.0000 mL | Freq: Every day | ORAL | Status: DC
  Administered 2015-09-15 – 2015-09-16 (×2): 30 mL via ORAL
  Filled 2015-09-14 (×2): qty 30

## 2015-09-14 MED ORDER — ENSURE ENLIVE PO LIQD
237.0000 mL | Freq: Three times a day (TID) | ORAL | Status: DC
  Administered 2015-09-15 – 2015-09-16 (×4): 237 mL via ORAL

## 2015-09-14 MED ORDER — COLCHICINE 0.6 MG PO TABS
0.6000 mg | ORAL_TABLET | Freq: Every day | ORAL | Status: DC
  Administered 2015-09-15 – 2015-09-16 (×2): 0.6 mg via ORAL
  Filled 2015-09-14 (×3): qty 1

## 2015-09-14 MED ORDER — COLCHICINE 0.6 MG PO TABS
0.6000 mg | ORAL_TABLET | Freq: Two times a day (BID) | ORAL | Status: AC
  Administered 2015-09-14 (×2): 0.6 mg via ORAL
  Filled 2015-09-14 (×2): qty 1

## 2015-09-14 MED ORDER — SODIUM CHLORIDE 0.9 % IV SOLN
Freq: Once | INTRAVENOUS | Status: AC
  Administered 2015-09-14: 15:00:00 via INTRAVENOUS

## 2015-09-14 NOTE — Progress Notes (Signed)
[facility] PROGRESS NOTE  Kyle Heath KZS:[id number] DOB: [birth year] DOA: 09/07/2015  PCP: Foye Spurling, MD  Brief HPI: 81 year old African-American male with a past medical history of diabetes, hypertension, myeloproliferative disorder, atrial fibrillation, chronic diastolic dysfunction and presented with complaints of shortness of breath, weakness. He was found to be hypoxic. He had abnormal UA. There was some concern for diastolic congestive heart failure. Patient was admitted to the hospital for further management. Patient was seen by cardiology for a new systolic dysfunction. Patient's platelet count started decreasing.  Subjective: Patient was seen while he was eating his breakfast, sitting at bedside. He has a friend helping him with his breakfast. Denies any active bleeding, has bruising in his upper extremities. Denies any shortness of breath or lower extremity edema.  Assessment/Plan:  Principal Problem:   Acute on chronic combined systolic (congestive) and diastolic (congestive) heart failure (HCC) Active Problems:   Myeloproliferative disorder (HCC)   Atrial flutter (HCC)   Hypothyroidism   Weakness generalized   Essential hypertension, benign   Peripheral edema   Physical deconditioning   Hypoxia   UTI (lower urinary tract infection)   Acute kidney injury ([place])   ARF (acute renal failure) (HCC)   Thrombocytopenia (HCC)   Acute respiratory failure with hypoxia Presented with oxygen saturation of 85% on room air. Thought to be secondary to fluid overload. Patient has improved with Lasix. Oxygen saturation is 96% on room air.  Acute combined systolic and diastolic congestive heart failure Patient was noted to have elevated BNP. Pro-calcitonin was not significantly elevated. All of this pointed towards fluid overload.  Patient was started on Lasix. However, patient's creatinine started climbing. Dose of Lasix was reduced.  2-D echo showed LVEF of 25-30%  with diffuse hypokinesis new since 2014. Cardiology following. Continue to check daily weights and ins and outs.  Low-dose Coreg initiated by cardiology.  Creatinine worsened since yesterday, diuretics and hold, minimal symptoms.  Thrombocytopenia Patient has a known history of myeloproliferative disorder. Platelet count however, is significantly lower than what it was just a few weeks ago. Counts continue to decrease. No overt bleeding.  Discussed with Dr. [name] on 10/25 by Dr. [place] Pink. He states to transfuse if counts drop below 10 or if he starts bleeding.  No evidence of active bleeding but patient has bruising all over his upper extremities. I will transfuse 1 unit of pheresis platelets as the trend of platelets going down consistently. Today is 13,000.  Moderate right-sided pleural effusion Noted on chest x-ray. Considering the above this is thought to be secondary to congestive heart failure. Chest x-ray was repeated. Once again, bilateral pleural effusions were noted. Right greater than left. Older x-rays have been reviewed. He's had pleural effusions since 2013. Patient does not appear to be overtly symptomatic from same. Effusions are most likely due to CHF. No clear indication to do thoracentesis. Will need repeat films in a few weeks.  Hypotension On 10/20 patient became hypotensive and bradycardic. This was thought to be secondary to medications including his beta blocker, calcium channel blocker and Lasix. Patient had to be given a liter of normal saline. Blood pressure stabilized. His beta blocker and calcium channel blocker were discontinued. Coreg was reinitiated.  Acute on chronic kidney disease stage III Increase in creatinine is due to diuresis. Continue to monitor urine output. Renal ultrasound shows medical renal disease. No hydronephrosis. Will need to tolerate higher creatinine in this patient.  Mildly elevated troponin Likely due to demand ischemia. Do not anticipate  any further workup for same.  History of hypothyroidism Continue home medications.  History of atrial flutter Rate is well controlled. Not on anticoagulation due to bleeding risk secondary to thrombocytopenia.  Questionable UTI UA was not particularly impressive for urinary tract infection. No growth on urine cultures. He was initially placed on ceftriaxone. This has been discontinued.   Mild acute encephalopathy in the setting of baseline dementia Patient has history of dementia at baseline. Sometimes he gets agitated. This is likely secondary to new environment. Reorient daily.  DVT Prophylaxis: SCDs    Code Status: Full code  Family Communication: No family at bedside today. Skull is with his daughter yesterday. Disposition Plan: Continue current treatment. Continue to mobilize. Daughter requesting placement. However, PT and OT have seen the patient, and did not recommend short-term rehabilitation. Daughter is agreeable to home health.    Past medical history:  Past Medical History  Diagnosis Date  . Anemia   . Hypothyroidism   . Hypertension   . Myeloproliferative disorder ([place]) 06/30/2012  . Paroxysmal atrial flutter ([place]) 2014  . Chronic combined systolic and diastolic CHF (congestive heart failure) ([place])     a. EF in 2014 55-60% b. EF in 08/2015 25-30%  . Gout   . BPH (benign prostatic hypertrophy)   . COPD (chronic obstructive pulmonary disease) ([place])   . Bone marrow disorder   . CVA (cerebral vascular accident) ([place])   . Type II diabetes mellitus ([place])   . History of blood transfusion "several"  . Dementia   . Arthritis     "mostly in joints" (09/07/2015)    Consultants:  Cardiology Phone discussion with Dr. [name]  Procedures:  Echocardiogram Study Conclusions - Left ventricle: The cavity size was moderately dilated. Wallthickness was normal. Systolic function was severely reduced. Theestimated ejection fraction was in the range of 25% to 30%.Diffuse  hypokinesis. - Aortic valve: There was mild regurgitation. - Mitral valve: There was mild regurgitation. - Left atrium: The atrium was moderately dilated. - Right ventricle: The cavity size was moderately dilated. - Right atrium: The atrium was mildly dilated. - Atrial septum: No defect or patent foramen ovale was identified.  Antibiotics: None   Objective: Vital Signs  Filed Vitals:   09/13/15 2029 09/14/15 0647 09/14/15 1135 09/14/15 1143  BP: 123/82 117/56 117/66 119/52  Pulse: 94 96 80 84  Temp: 97.5 F (36.4 C) 97.6 F (36.4 C)  97.4 F (36.3 C)  TempSrc: Oral Oral  Oral  Resp: 18 17  18   Height:      Weight:      SpO2: 93% 93%  96%    Intake/Output Summary (Last 24 hours) at 09/14/15 1232 Last data filed at 09/14/15 1005  Gross per 24 hour  Intake    600 ml  Output   1001 ml  Net   -401 ml   Filed Weights   09/11/15 0424 09/12/15 0600 09/13/15 0405  Weight: 72.666 kg (160 lb 3.2 oz) 74.072 kg (163 lb 4.8 oz) 72.258 kg (159 lb 4.8 oz)    General appearance: alert, cooperative, appears stated age and no distress Resp: Air entry continues to improve. No crackles or wheezing heard today. Diminished at the right base with dullness to percussion. Cardio: regular rate and rhythm, S1, S2 normal, no murmur, click, rub or gallop GI: soft, non-tender; bowel sounds normal; no masses,  no organomegaly Extremities: Minimal edema bilateral lower extremities. Neurologic: No focal deficits  Lab Results:  Basic Metabolic Panel:  Recent Labs  Lab 09/07/15 1758  09/10/15 0240 09/11/15 0343 09/12/15 0320 09/13/15 0450 09/14/15 0237  NA  --   < > 134* 139 141 140 142  K  --   < > 4.6 4.5 4.4 4.4 4.3  CL  --   < > 97* 101 104 103 101  CO2  --   < > 26 24 25 24 26   GLUCOSE  --   < > 95 82 86 105* 105*  BUN  --   < > 52* 48* 46* 52* 57*  CREATININE  --   < > 2.44* 1.94* 1.95* 2.20* 2.27*  CALCIUM  --   < > 8.2* 8.3* 8.1* 8.2* 8.5*  MG 1.9  --   --   --   --   --   --    < > = values in this interval not displayed. Liver Function Tests: No results for input(s): AST, ALT, ALKPHOS, BILITOT, PROT, ALBUMIN in the last 168 hours. CBC:  Recent Labs Lab 09/10/15 0240 09/11/15 0343 09/12/15 0320 09/13/15 0450 09/14/15 0237  WBC 10.6* 9.0 9.3 9.7 8.6  HGB 15.0 14.9 14.1 13.9 14.3  HCT 52.5* 53.1* 50.5 50.4 52.1*  MCV 89.3 89.8 89.5 89.4 89.7  PLT 54* 47* 23* 15* 13*   Cardiac Enzymes:  Recent Labs Lab 09/07/15 1758 09/08/15 0448 09/08/15 1910 09/09/15 0100 09/09/15 0530  TROPONINI 0.04* 0.19* 0.06* 0.11* <0.03   BNP (last 3 results)  Recent Labs  09/07/15 1657  BNP 1729.4*    CBG:  Recent Labs Lab 09/13/15 1652 09/13/15 2134 09/14/15 0615 09/14/15 0643 09/14/15 1142  GLUCAP 103* 129* 118* 120* 114*    Recent Results (from the past 240 hour(s))  Urine culture     Status: None   Collection Time: 09/07/15  1:50 PM  Result Value Ref Range Status   Specimen Description URINE, CLEAN CATCH  Final   Special Requests Normal  Final   Culture MULTIPLE SPECIES PRESENT, SUGGEST RECOLLECTION  Final   Report Status 09/09/2015 FINAL  Final      Studies/Results: No results found.  Medications:  Scheduled: . sodium chloride   Intravenous Once  . carvedilol  6.25 mg Oral BID WC  . colchicine  0.6 mg Oral BID  . [START ON 09/15/2015] colchicine  0.6 mg Oral Daily  . febuxostat  80 mg Oral Daily  . hydrALAZINE  10 mg Oral 3 times per day  . insulin aspart  0-5 Units Subcutaneous QHS  . insulin aspart  0-9 Units Subcutaneous TID WC  . isosorbide mononitrate  15 mg Oral Daily  . levothyroxine  88 mcg Oral QAC breakfast  . nicotine  14 mg Transdermal Daily   Continuous:  MWU:[id number] **OR** acetaminophen, HYDROcodone-acetaminophen, ondansetron **OR** ondansetron (ZOFRAN) IV, traZODone   LOS: 7 days   [facility] A  [facility] Pager [phone]  09/14/2015, 12:32 PM  If 7PM-7AM, please contact night-coverage at  [email or web address], password [facility]

## 2015-09-14 NOTE — Progress Notes (Signed)
Initial Nutrition Assessment  DOCUMENTATION CODES:   Non-severe (moderate) malnutrition in context of chronic illness  INTERVENTION:  Provide Ensure Enlive po TID, each supplement provides 350 kcal and 20 grams of protein Provide Pro-stat once daily, provides 100 kcal and 15 grams of protein Encourage PO intake  NUTRITION DIAGNOSIS:   Inadequate oral intake related to chronic illness, poor appetite as evidenced by meal completion < 25%, moderate depletion of body fat, moderate depletions of muscle mass.   GOAL:   Patient will meet greater than or equal to 90% of their needs   MONITOR:   PO intake, Labs, Weight trends, I & O's  REASON FOR ASSESSMENT:   Consult Poor PO  ASSESSMENT:   81 year old African-American male with a past medical history of diabetes, hypertension, myeloproliferative disorder, atrial fibrillation, chronic diastolic dysfunction and presented with complaints of shortness of breath, weakness. He was found to be hypoxic. He had abnormal UA. There was some concern for diastolic congestive heart failure. Patient was admitted to the hospital for further management.  RN reports that patient has only been taking a few bites of food at each meal. He reports having a good appetite and eating well but, states he is eating less than half of all meals. He denies any nausea or abdominal pain. He likes Ensure and would like to receive it instead of food. Encouraged patient to eat at each meal and drink Ensure in between meals. Patient has moderate to severe muscle wasting and moderate fat wasting per nutrition-focused physical exam. Per pt's wife at bedside, patient usually weighs 180 lbs, last weighing so about one year ago.   Labs: elevated BUN and creatinine, low calcium, decreased GFR  Diet Order:  Diet heart healthy/carb modified Room service appropriate?: Yes; Fluid consistency:: Thin  Skin:  Reviewed, no issues  Last BM:  10/25  Height:   Ht Readings from  Last 1 Encounters:  09/07/15 5\' 8"  (1.727 m)    Weight:   Wt Readings from Last 1 Encounters:  09/13/15 159 lb 4.8 oz (72.258 kg)    Ideal Body Weight:  70 kg  BMI:  Body mass index is 24.23 kg/(m^2).  Estimated Nutritional Needs:   Kcal:  1700-1900  Protein:  90-100 grams  Fluid:  1.7-1.9 L/day  EDUCATION NEEDS:   No education needs identified at this time  [place], LDN Inpatient Clinical Dietitian Pager: [phone] After Hours Pager: [phone]

## 2015-09-14 NOTE — Progress Notes (Signed)
Subjective: Cold  No CP  Breathng fair  Feet hurt   Objective: Filed Vitals:   09/13/15 1127 09/13/15 1238 09/13/15 2029 09/14/15 0647  BP: 93/59 117/58 123/82 117/56  Pulse: 87 94 94 96  Temp:  97.4 F (36.3 C) 97.5 F (36.4 C) 97.6 F (36.4 C)  TempSrc:  Oral Oral Oral  Resp:  18 18 17   Height:      Weight:      SpO2:  96% 93% 93%   Weight change:   Intake/Output Summary (Last 24 hours) at 09/14/15 1010 Last data filed at 09/14/15 1005  Gross per 24 hour  Intake    840 ml  Output   1201 ml  Net   -361 ml    General: Alert, awake, oriented x3, in no acute distress Neck:  JVP is increased Heart: Regular rate and rhythm, without murmurs, rubs, gallops.  Lungs: Relatively clear to auscultation.  No rales or wheezes. Exemities:  Triv edema.   Neuro: Grossly intact, nonfocal.  Net I/O 1.86 L positive     Tele:  SR with PACs   Lab Results: Results for orders placed or performed during the hospital encounter of 09/07/15 (from the past 24 hour(s))  Glucose, capillary     Status: None   Collection Time: 09/13/15 10:58 AM  Result Value Ref Range   Glucose-Capillary 87 65 - 99 mg/dL  Uric acid     Status: Abnormal   Collection Time: 09/13/15  2:21 PM  Result Value Ref Range   Uric Acid, Serum 12.1 (H) 4.4 - 7.6 mg/dL  Glucose, capillary     Status: Abnormal   Collection Time: 09/13/15  4:52 PM  Result Value Ref Range   Glucose-Capillary 103 (H) 65 - 99 mg/dL  Glucose, capillary     Status: Abnormal   Collection Time: 09/13/15  9:34 PM  Result Value Ref Range   Glucose-Capillary 129 (H) 65 - 99 mg/dL  CBC     Status: Abnormal   Collection Time: 09/14/15  2:37 AM  Result Value Ref Range   WBC 8.6 4.0 - 10.5 K/uL   RBC 5.81 4.22 - 5.81 MIL/uL   Hemoglobin 14.3 13.0 - 17.0 g/dL   HCT 52.1 (H) 39.0 - 52.0 %   MCV 89.7 78.0 - 100.0 fL   MCH 24.6 (L) 26.0 - 34.0 pg   MCHC 27.4 (L) 30.0 - 36.0 g/dL   RDW 26.7 (H) 11.5 - 15.5 %   Platelets 13 (LL) 150 - 400 K/uL    Basic metabolic panel     Status: Abnormal   Collection Time: 09/14/15  2:37 AM  Result Value Ref Range   Sodium 142 135 - 145 mmol/L   Potassium 4.3 3.5 - 5.1 mmol/L   Chloride 101 101 - 111 mmol/L   CO2 26 22 - 32 mmol/L   Glucose, Bld 105 (H) 65 - 99 mg/dL   BUN 57 (H) 6 - 20 mg/dL   Creatinine, Ser 2.27 (H) 0.61 - 1.24 mg/dL   Calcium 8.5 (L) 8.9 - 10.3 mg/dL   GFR calc non Af Amer 25 (L) >60 mL/min   GFR calc Af Amer 29 (L) >60 mL/min   Anion gap 15 5 - 15  Glucose, capillary     Status: Abnormal   Collection Time: 09/14/15  6:15 AM  Result Value Ref Range   Glucose-Capillary 118 (H) 65 - 99 mg/dL  Glucose, capillary     Status: Abnormal   Collection  Time: 09/14/15  6:43 AM  Result Value Ref Range   Glucose-Capillary 120 (H) 65 - 99 mg/dL    Studies/Results: No results found.  Medications: REviewed   @PROBHOSP @  1  Acute on chronic systolic/diastolic CHF   LVEF 25 to 30%  Lasix held.  Imdur/hydralazine started.    2  PAF  Currently in SR  Not anticoag candidate  3.  Stage III CKD  Cr still not plateaued  Hold diuretic    4.  Foot pain.  Uric acid elevated at 12.1  5.  Thrombocytopenia  Plt 13000  No obvious bleeding  Tx will place volume load   Pt overall prognosis (long term) is poor  He remains full code.      LOS: 7 days   Dorris Carnes 09/14/2015, 10:10 AM

## 2015-09-14 NOTE — Progress Notes (Signed)
Physical Therapy Treatment Patient Details Name: Kyle Heath MRN: [id number] DOB: 1927/01/11 Today's Date: 09/14/2015    History of Present Illness Mr. Boomershine is an 81 year old male adm with SOB, wekaness; PMHx: diastolic heart failure, diabetes mellitus, hypertension, history of paroxysmal A. fib flutter (not on anticoagulation due to underlying myeloproliferative disorder) COPD     PT Comments    PT session focused on safety with ambulation and increased activity tolerance with pt limited by motivation to participate and impulsivity limiting his safety. Continued skilled acute PT services are recommended to maximize safety and independence with functional mobility and to increase activity tolerance.  PT continues to recommend HHPT upon discharge and family's questions regarding this were answered.   Follow Up Recommendations  Home health PT;Supervision/Assistance - 24 hour     Equipment Recommendations  None recommended by PT    Recommendations for Other Services       Precautions / Restrictions Precautions Precautions: Fall Restrictions Weight Bearing Restrictions: No    Mobility  Bed Mobility               General bed mobility comments: received sitting in recliner  Transfers Overall transfer level: Needs assistance Equipment used: None Transfers: Sit to/from Stand Sit to Stand: Min guard (for safety and stability)         General transfer comment: VCs required to scoot to EOB, for foot placement, and to lean forward to come to standing.  Pt continuously asking for help to stand but eventually able to perform with Min guard.  Pt with poor safety with stand to sit as he needed cueing to prevent from sitting before chair was behind him.  Ambulation/Gait Ambulation/Gait assistance: Min guard Ambulation Distance (Feet): 25 Feet (25'x2) Assistive device: 1 person hand held assist Gait Pattern/deviations: Step-through pattern;Drifts right/left;Narrow base of  support;Scissoring Gait velocity: slow Gait velocity interpretation: Below normal speed for age/gender General Gait Details: Min guard for safety with pt reaching for objects to help steady himself.  Pt not very motivated to participate.   Stairs            Wheelchair Mobility    Modified Rankin (Stroke Patients Only)       Balance Overall balance assessment: Needs assistance Sitting-balance support: Feet supported Sitting balance-Leahy Scale: Good       Standing balance-Leahy Scale: Fair                      Cognition Arousal/Alertness: Lethargic Behavior During Therapy: Impulsive (not motivated to participate) Overall Cognitive Status: History of cognitive impairments - at baseline                      Exercises      General Comments General comments (skin integrity, edema, etc.): Pt with limited motivation to participate with therapy.  Pt's family was present and their questions regarding care following discharge were answered.  On RA O2 sats 88%.  Prior to ambulation on 2L O2, sats at 95% which dropped to 90% with ambulation and rebounded quickly with rest.      Pertinent Vitals/Pain Pain Assessment: No/denies pain    Home Living                      Prior Function            PT Goals (current goals can now be found in the care plan section) Acute Rehab PT Goals Patient Stated Goal: to go  home PT Goal Formulation: With patient Time For Goal Achievement: 09/16/15 Potential to Achieve Goals: Good Progress towards PT goals: Progressing toward goals    Frequency  Min 3X/week    PT Plan Current plan remains appropriate    Co-evaluation             End of Session Equipment Utilized During Treatment: Gait belt;Oxygen (2L) Activity Tolerance: Patient limited by fatigue Patient left: in chair;with chair alarm set;with family/visitor present;with call bell/phone within reach     Time: 1150-1216 PT Time Calculation  (min) (ACUTE ONLY): 26 min  Charges:  $Gait Training: 8-22 mins $Therapeutic Activity: 8-22 mins                    G Codes:      Shiana Rappleye 2015-10-14, 1:17 PM Lorita Officer, SPT

## 2015-09-14 NOTE — Progress Notes (Signed)
Attempted to get consent from pt's daughters names listed on the chart not able to get any response at this time. Will continue to try.  Karie Kirks, Therapist, sports.

## 2015-09-15 LAB — GLUCOSE, CAPILLARY
GLUCOSE-CAPILLARY: 89 mg/dL (ref 65–99)
Glucose-Capillary: 105 mg/dL — ABNORMAL HIGH (ref 65–99)
Glucose-Capillary: 95 mg/dL (ref 65–99)
Glucose-Capillary: 134 mg/dL — ABNORMAL HIGH (ref 65–99)

## 2015-09-15 LAB — BASIC METABOLIC PANEL
ANION GAP: 9 (ref 5–15)
BUN: 58 mg/dL — AB (ref 6–20)
CALCIUM: 8.3 mg/dL — AB (ref 8.9–10.3)
CO2: 28 mmol/L (ref 22–32)
CREATININE: 2.16 mg/dL — AB (ref 0.61–1.24)
Chloride: 103 mmol/L (ref 101–111)
GFR calc Af Amer: 30 mL/min — ABNORMAL LOW (ref >60)
GFR, EST NON AFRICAN AMERICAN: 26 mL/min — AB (ref >60)
GLUCOSE: 109 mg/dL — AB (ref 65–99)
Potassium: 4.5 mmol/L (ref 3.5–5.1)
Sodium: 140 mmol/L (ref 135–145)

## 2015-09-15 LAB — CBC
HCT: 52 % (ref 39.0–52.0)
HEMOGLOBIN: 14.3 g/dL (ref 13.0–17.0)
MCH: 24.5 pg — AB (ref 26.0–34.0)
MCHC: 27.5 g/dL — AB (ref 30.0–36.0)
MCV: 89.2 fL (ref 78.0–100.0)
Platelets: 26 10*3/uL — CL (ref 150–400)
RBC: 5.83 MIL/uL — ABNORMAL HIGH (ref 4.22–5.81)
RDW: 26.4 % — AB (ref 11.5–15.5)
WBC: 8.5 10*3/uL (ref 4.0–10.5)

## 2015-09-15 LAB — PREPARE PLATELET PHERESIS: UNIT DIVISION: 0

## 2015-09-15 MED ORDER — FUROSEMIDE 80 MG PO TABS
80.0000 mg | ORAL_TABLET | Freq: Every day | ORAL | Status: DC
  Administered 2015-09-15 – 2015-09-16 (×2): 80 mg via ORAL
  Filled 2015-09-15 (×2): qty 1

## 2015-09-15 NOTE — Progress Notes (Signed)
[facility] PROGRESS NOTE  Kyle Heath QVZ:[id number] DOB: Mar 12, 1927 DOA: 09/07/2015  PCP: Foye Spurling, MD  Brief HPI: 81 year old African-American male with a past medical history of diabetes, hypertension, myeloproliferative disorder, atrial fibrillation, chronic diastolic dysfunction and presented with complaints of shortness of breath, weakness. He was found to be hypoxic. He had abnormal UA. There was some concern for diastolic congestive heart failure. Patient was admitted to the hospital for further management. Patient was seen by cardiology for a new systolic dysfunction. Patient's platelet count started decreasing.  Subjective: Seen with safety sitter at bedside. Denies any complaints. Platelets improved to 26 after transfusion.  Assessment/Plan:  Principal Problem:   Acute on chronic combined systolic (congestive) and diastolic (congestive) heart failure (HCC) Active Problems:   Myeloproliferative disorder (HCC)   Atrial flutter (HCC)   Hypothyroidism   Weakness generalized   Essential hypertension, benign   Peripheral edema   Physical deconditioning   Hypoxia   UTI (lower urinary tract infection)   Acute kidney injury ([place])   ARF (acute renal failure) (HCC)   Thrombocytopenia (HCC)   CAP (community acquired pneumonia)   Malnutrition of moderate degree   Acute respiratory failure with hypoxia Presented with oxygen saturation of 85% on room air. Thought to be secondary to fluid overload. Patient has improved with Lasix. Oxygen saturation is 96% on room air.  Acute combined systolic and diastolic congestive heart failure Patient was noted to have elevated BNP. Pro-calcitonin was not significantly elevated. All of this pointed towards fluid overload.  Patient was started on Lasix. However, patient's creatinine started climbing. Dose of Lasix was reduced.  2-D echo showed LVEF of 25-30% with diffuse hypokinesis new since 2014. Cardiology  following. Continue to check daily weights and ins and outs.  Low-dose Coreg initiated by cardiology.  Oral Lasix to be resumed today per cardiology.  Thrombocytopenia Patient has a known history of myeloproliferative disorder. Platelet count however, is significantly lower than what it was just a few weeks ago. Counts continue to decrease. No overt bleeding.  Discussed with Dr. [name] on 10/25 by Dr. [place] Pink. He states to transfuse if counts drop below 10 or if he starts bleeding.  No evidence of active bleeding but patient has bruising all over his upper extremities. Status post transfusion of 1 unit of apheresis platelets, improved from 13,000-26,000, check CBC in a.m.  Moderate right-sided pleural effusion Noted on chest x-ray. Considering the above this is thought to be secondary to congestive heart failure. Chest x-ray was repeated. Once again, bilateral pleural effusions were noted. Right greater than left. Older x-rays have been reviewed. He's had pleural effusions since 2013. Patient does not appear to be overtly symptomatic from same. Effusions are most likely due to CHF. No clear indication to do thoracentesis. Will need repeat films in a few weeks.  Hypotension On 10/20 patient became hypotensive and bradycardic. This was thought to be secondary to medications including his beta blocker, calcium channel blocker and Lasix. Patient had to be given a liter of normal saline. Blood pressure stabilized. His beta blocker and calcium channel blocker were discontinued. Coreg was reinitiated.  Acute on chronic kidney disease stage III Increase in creatinine is due to diuresis. Continue to monitor urine output. Renal ultrasound shows medical renal disease.  No hydronephrosis. Creatinine improved slightly since yesterday. Admitted with creatinine of 1.68, creatinine today is 2.1.  Mildly elevated troponin Slightly elevated troponin, likely demand ischemia, flat curve. Patient does not have  any chest pain or any EKG  changes.  History of hypothyroidism Continue home medications.  History of atrial flutter Rate is well controlled. Not on anticoagulation due to bleeding risk secondary to thrombocytopenia.  Questionable UTI UA was not particularly impressive for urinary tract infection. No growth on urine cultures. He was initially placed on ceftriaxone. This has been discontinued.   Mild acute encephalopathy in the setting of baseline dementia Patient has history of dementia at baseline. Sometimes he gets agitated. This is likely secondary to new environment. Reorient daily.  DVT Prophylaxis: SCDs    Code Status: Full code  Family Communication: No family at bedside today. Skull is with his daughter yesterday. Disposition Plan: Continue current treatment. Continue to mobilize. Daughter requesting placement. However, PT and OT have seen the patient, and did not recommend short-term rehabilitation. Daughter is agreeable to home health.    Past medical history:  Past Medical History  Diagnosis Date  . Anemia   . Hypothyroidism   . Hypertension   . Myeloproliferative disorder ([place]) 06/30/2012  . Paroxysmal atrial flutter ([place]) 2014  . Chronic combined systolic and diastolic CHF (congestive heart failure) ([employer])     a. EF in 2014 55-60% b. EF in 08/2015 25-30%  . Gout   . BPH (benign prostatic hypertrophy)   . COPD (chronic obstructive pulmonary disease) ([place])   . Bone marrow disorder   . CVA (cerebral vascular accident) ([place])   . Type II diabetes mellitus ([place])   . History of blood transfusion "several"  . Dementia   . Arthritis     "mostly in joints" (09/07/2015)    Consultants:  Cardiology Phone discussion with Dr. [name]  Procedures:  Echocardiogram Study Conclusions - Left ventricle: The cavity size was moderately dilated. Wallthickness was normal. Systolic function was severely reduced. Theestimated ejection fraction was in the range of 25% to  30%.Diffuse hypokinesis. - Aortic valve: There was mild regurgitation. - Mitral valve: There was mild regurgitation. - Left atrium: The atrium was moderately dilated. - Right ventricle: The cavity size was moderately dilated. - Right atrium: The atrium was mildly dilated. - Atrial septum: No defect or patent foramen ovale was identified.  Antibiotics: None   Objective: Vital Signs  Filed Vitals:   09/14/15 1931 09/15/15 0531 09/15/15 0611 09/15/15 1145  BP: 132/86 145/64  122/64  Pulse: 98 93  75  Temp: 98.8 F (37.1 C) 97.5 F (36.4 C)  97.8 F (36.6 C)  TempSrc: Oral Oral  Oral  Resp: 18 20  18   Height:      Weight:   69.582 kg (153 lb 6.4 oz)   SpO2: 93% 97%  96%    Intake/Output Summary (Last 24 hours) at 09/15/15 1241 Last data filed at 09/15/15 1100  Gross per 24 hour  Intake   1010 ml  Output    250 ml  Net    760 ml   Filed Weights   09/12/15 0600 09/13/15 0405 09/15/15 0611  Weight: 74.072 kg (163 lb 4.8 oz) 72.258 kg (159 lb 4.8 oz) 69.582 kg (153 lb 6.4 oz)    General appearance: alert, cooperative, appears stated age and no distress Resp: Air entry continues to improve. No crackles or wheezing heard today. Diminished at the right base with dullness to percussion. Cardio: regular rate and rhythm, S1, S2 normal, no murmur, click, rub or gallop GI: soft, non-tender; bowel sounds normal; no masses,  no organomegaly Extremities: Minimal edema bilateral lower extremities. Neurologic: No focal deficits  Lab Results:  Basic Metabolic Panel:  Recent Labs Lab 09/11/15 0343 09/12/15 0320 09/13/15 0450 09/14/15 0237 09/15/15 0323  NA 139 141 140 142 140  K 4.5 4.4 4.4 4.3 4.5  CL 101 104 103 101 103  CO2 24 25 24 26 28   GLUCOSE 82 86 105* 105* 109*  BUN 48* 46* 52* 57* 58*  CREATININE 1.94* 1.95* 2.20* 2.27* 2.16*  CALCIUM 8.3* 8.1* 8.2* 8.5* 8.3*   Liver Function Tests: No results for input(s): AST, ALT, ALKPHOS, BILITOT, PROT, ALBUMIN in the  last 168 hours. CBC:  Recent Labs Lab 09/11/15 0343 09/12/15 0320 09/13/15 0450 09/14/15 0237 09/15/15 0323  WBC 9.0 9.3 9.7 8.6 8.5  HGB 14.9 14.1 13.9 14.3 14.3  HCT 53.1* 50.5 50.4 52.1* 52.0  MCV 89.8 89.5 89.4 89.7 89.2  PLT 47* 23* 15* 13* 26*   Cardiac Enzymes:  Recent Labs Lab 09/08/15 1910 09/09/15 0100 09/09/15 0530  TROPONINI 0.06* 0.11* <0.03   BNP (last 3 results)  Recent Labs  09/07/15 1657  BNP 1729.4*    CBG:  Recent Labs Lab 09/14/15 1142 09/14/15 1617 09/14/15 2100 09/15/15 0602 09/15/15 1119  GLUCAP 114* 148* 110* 89 105*    Recent Results (from the past 240 hour(s))  Urine culture     Status: None   Collection Time: 09/07/15  1:50 PM  Result Value Ref Range Status   Specimen Description URINE, CLEAN CATCH  Final   Special Requests Normal  Final   Culture MULTIPLE SPECIES PRESENT, SUGGEST RECOLLECTION  Final   Report Status 09/09/2015 FINAL  Final      Studies/Results: No results found.  Medications:  Scheduled: . carvedilol  6.25 mg Oral BID WC  . colchicine  0.6 mg Oral Daily  . febuxostat  80 mg Oral Daily  . feeding supplement (ENSURE ENLIVE)  237 mL Oral TID BM  . feeding supplement (PRO-STAT SUGAR FREE 64)  30 mL Oral Daily  . furosemide  80 mg Oral Daily  . hydrALAZINE  10 mg Oral 3 times per day  . insulin aspart  0-5 Units Subcutaneous QHS  . insulin aspart  0-9 Units Subcutaneous TID WC  . isosorbide mononitrate  15 mg Oral Daily  . levothyroxine  88 mcg Oral QAC breakfast  . nicotine  14 mg Transdermal Daily   Continuous:  DSK:AJGOTLXBWIOMB **OR** acetaminophen, HYDROcodone-acetaminophen, ondansetron **OR** ondansetron (ZOFRAN) IV, traZODone   LOS: 8 days   [facility] A  [facility] Pager [phone]  09/15/2015, 12:41 PM  If 7PM-7AM, please contact night-coverage at [email or web address], password [facility]

## 2015-09-15 NOTE — Progress Notes (Signed)
Subjective: Breathing is OK  No CP   Objective: Filed Vitals:   09/14/15 1931 09/15/15 0531 09/15/15 0611 09/15/15 1145  BP: 132/86 145/64  122/64  Pulse: 98 93  75  Temp: 98.8 F (37.1 C) 97.5 F (36.4 C)  97.8 F (36.6 C)  TempSrc: Oral Oral  Oral  Resp: 18 20  18   Height:      Weight:   153 lb 6.4 oz (69.582 kg)   SpO2: 93% 97%  96%   Weight change:   Intake/Output Summary (Last 24 hours) at 09/15/15 1151 Last data filed at 09/15/15 1100  Gross per 24 hour  Intake   1130 ml  Output    250 ml  Net    880 ml   I/O +2.7 L  Not complete  General: Alert, awake, oriented x3, in no acute distress Neck:  JVP is normal Heart: Regular rate and rhythm with occasional skip., without murmurs, rubs, gallops.  Lungs: Clear to auscultation.  No rales or wheezes. Exemities:  Tr edema.   Neuro: Grossly intact, nonfocal.   Lab Results: Results for orders placed or performed during the hospital encounter of 09/07/15 (from the past 24 hour(s))  Glucose, capillary     Status: Abnormal   Collection Time: 09/14/15  4:17 PM  Result Value Ref Range   Glucose-Capillary 148 (H) 65 - 99 mg/dL   Comment 1 Notify RN   Glucose, capillary     Status: Abnormal   Collection Time: 09/14/15  9:00 PM  Result Value Ref Range   Glucose-Capillary 110 (H) 65 - 99 mg/dL   Comment 1 Notify RN    Comment 2 Document in Chart   Basic metabolic panel     Status: Abnormal   Collection Time: 09/15/15  3:23 AM  Result Value Ref Range   Sodium 140 135 - 145 mmol/L   Potassium 4.5 3.5 - 5.1 mmol/L   Chloride 103 101 - 111 mmol/L   CO2 28 22 - 32 mmol/L   Glucose, Bld 109 (H) 65 - 99 mg/dL   BUN 58 (H) 6 - 20 mg/dL   Creatinine, Ser 2.16 (H) 0.61 - 1.24 mg/dL   Calcium 8.3 (L) 8.9 - 10.3 mg/dL   GFR calc non Af Amer 26 (L) >60 mL/min   GFR calc Af Amer 30 (L) >60 mL/min   Anion gap 9 5 - 15  CBC     Status: Abnormal   Collection Time: 09/15/15  3:23 AM  Result Value Ref Range   WBC 8.5 4.0 - 10.5  K/uL   RBC 5.83 (H) 4.22 - 5.81 MIL/uL   Hemoglobin 14.3 13.0 - 17.0 g/dL   HCT 52.0 39.0 - 52.0 %   MCV 89.2 78.0 - 100.0 fL   MCH 24.5 (L) 26.0 - 34.0 pg   MCHC 27.5 (L) 30.0 - 36.0 g/dL   RDW 26.4 (H) 11.5 - 15.5 %   Platelets 26 (LL) 150 - 400 K/uL  Glucose, capillary     Status: None   Collection Time: 09/15/15  6:02 AM  Result Value Ref Range   Glucose-Capillary 89 65 - 99 mg/dL  Glucose, capillary     Status: Abnormal   Collection Time: 09/15/15 11:19 AM  Result Value Ref Range   Glucose-Capillary 105 (H) 65 - 99 mg/dL    Studies/Results: No results found.  Medications:Reviewed   @PROBHOSP @  1  Acute on chronic sysstolic  CHF  LVEF 25 to 30%   Would  resume oral lasix.  80 mg now  Continue imdur/hydralzien  Watch salt   2  PAF  Remains in SR with PACs  Not an anticoag candidate with decreased plts  3.  CKD  Cr has improved some   Will need to be followed  4.  Decreased plt  Got tx yesterday  6  Foot pain  Improved on colchicine  WIll need to be followed.    Spoke with daughter  Told her pt has many problems that cannot be fixed. Plan is to try to find balance, keep him comfortable.    LOS: 8 days   Dorris Carnes 09/15/2015, 11:51 AM

## 2015-09-15 NOTE — Progress Notes (Signed)
Occupational Therapy Treatment Patient Details Name: Kyle Heath MRN: [id number] DOB: 1927-09-29 Today's Date: 09/15/2015    History of present illness [name] is an 81 year old male adm with SOB, wekaness; PMHx: diastolic heart failure, diabetes mellitus, hypertension, history of paroxysmal A. fib flutter (not on anticoagulation due to underlying myeloproliferative disorder) COPD    OT comments  This 81 yo male admitted with above presents to acute OT with making progress with mobility with basic ADLs. He will continue to benefit from acute OT without need for follow up.  Follow Up Recommendations  No OT follow up    Equipment Recommendations  None recommended by OT       Precautions / Restrictions Precautions Precautions: Fall Restrictions Weight Bearing Restrictions: No       Mobility Bed Mobility               General bed mobility comments: pt sitting EOB upon my arrival  Transfers Overall transfer level: Needs assistance Equipment used: None Transfers: Sit to/from Stand Sit to Stand: Min guard Stand pivot transfers: Min guard            Balance Overall balance assessment: Needs assistance Sitting-balance support: Feet supported Sitting balance-Leahy Scale: Good     Standing balance support: No upper extremity supported Standing balance-Leahy Scale: Fair                     ADL Overall ADL's : Needs assistance/impaired     Grooming: Wash/dry hands;Supervision/safety;Standing               Lower Body Dressing: Set up;Supervision/safety (with min guard A sit<>stand)   Toilet Transfer: Min guard;Ambulation;Regular Glass blower/designer Details (indicate cue type and reason): No AD Toileting- Clothing Manipulation and Hygiene: Min guard;Sit to/from stand                          Cognition   Behavior During Therapy: Impulsive Overall Cognitive Status: History of cognitive impairments - at baseline                                     Pertinent Vitals/ Pain       Pain Assessment: No/denies pain         Frequency Min 2X/week     Progress Toward Goals  OT Goals(current goals can now be found in the care plan section)  Progress towards OT goals: Progressing toward goals     Plan Discharge plan remains appropriate       End of Session     Activity Tolerance Patient tolerated treatment well   Patient Left in chair;with nursing/sitter in room   Nurse Communication          Time: 4098-1191 OT Time Calculation (min): 14 min  Charges: OT General Charges $OT Visit: 1 Procedure OT Treatments $Self Care/Home Management : 8-22 mins  Almon Register [phone] 09/15/2015, 2:05 PM

## 2015-09-16 ENCOUNTER — Other Ambulatory Visit: Payer: Self-pay

## 2015-09-16 ENCOUNTER — Telehealth: Payer: Self-pay | Admitting: Cardiology

## 2015-09-16 LAB — URINALYSIS, ROUTINE W REFLEX MICROSCOPIC
BILIRUBIN URINE: NEGATIVE
GLUCOSE, UA: NEGATIVE mg/dL
KETONES UR: NEGATIVE mg/dL
NITRITE: NEGATIVE
PH: 5 (ref 5.0–8.0)
Protein, ur: 30 mg/dL — AB
SPECIFIC GRAVITY, URINE: 1.01 (ref 1.005–1.030)
Urobilinogen, UA: 0.2 mg/dL (ref 0.0–1.0)

## 2015-09-16 LAB — URINE MICROSCOPIC-ADD ON

## 2015-09-16 LAB — CBC
HCT: 54.1 % — ABNORMAL HIGH (ref 39.0–52.0)
HEMOGLOBIN: 15.4 g/dL (ref 13.0–17.0)
MCH: 25.1 pg — AB (ref 26.0–34.0)
MCHC: 28.5 g/dL — AB (ref 30.0–36.0)
MCV: 88.3 fL (ref 78.0–100.0)
PLATELETS: 12 10*3/uL — AB (ref 150–400)
RBC: 6.13 MIL/uL — ABNORMAL HIGH (ref 4.22–5.81)
RDW: 26.2 % — ABNORMAL HIGH (ref 11.5–15.5)
WBC: 9.5 10*3/uL (ref 4.0–10.5)

## 2015-09-16 LAB — GLUCOSE, CAPILLARY
GLUCOSE-CAPILLARY: 130 mg/dL — AB (ref 65–99)
GLUCOSE-CAPILLARY: 86 mg/dL (ref 65–99)

## 2015-09-16 MED ORDER — ISOSORBIDE MONONITRATE ER 30 MG PO TB24: 15.0000 mg | ORAL_TABLET | Freq: Every day | ORAL | Status: DC

## 2015-09-16 MED ORDER — HYDRALAZINE HCL 10 MG PO TABS: 10.0000 mg | ORAL_TABLET | Freq: Three times a day (TID) | ORAL | Status: DC

## 2015-09-16 MED ORDER — FUROSEMIDE 80 MG PO TABS: 80.0000 mg | ORAL_TABLET | Freq: Every day | ORAL | Status: DC

## 2015-09-16 NOTE — Telephone Encounter (Signed)
New Message  Called to sched TCM appt per Suanne Marker (11/4 @ 9:30am w/ Mickel Baas)- pt daughter requested to speak w/ RN- pt still has CP and has trouble walking like before hospital stay. Please call back and discuss.

## 2015-09-16 NOTE — Care Management Important Message (Signed)
Important Message  Patient Details  Name: Kyle Heath MRN: [id number] Date of Birth: [birth year]   Medicare Important Message Given:  Yes-third notification given    Delorse Lek 09/16/2015, 12:58 PM

## 2015-09-16 NOTE — Discharge Summary (Signed)
Physician Discharge Summary  Cecile Guevara HYW:[id number] DOB: January 02, 1927 DOA: 09/07/2015  PCP: Foye Spurling, MD  Admit date: 09/07/2015 Discharge date: 09/16/2015  Time spent: 40 minutes  Recommendations for Outpatient Follow-up:  1. Follow-up with primary care physician within one week. 2. Follow-up with oncologist in 1 week. Needs a CBC in 1 week.  Discharge Diagnoses:  Principal Problem:   Acute on chronic combined systolic (congestive) and diastolic (congestive) heart failure (HCC) Active Problems:   Myeloproliferative disorder (HCC)   Atrial flutter (HCC)   Hypothyroidism   Weakness generalized   Essential hypertension, benign   Peripheral edema   Physical deconditioning   Hypoxia   UTI (lower urinary tract infection)   Acute kidney injury ([place])   ARF (acute renal failure) (HCC)   Thrombocytopenia (HCC)   CAP (community acquired pneumonia)   Malnutrition of moderate degree   Discharge Condition: Stable  Diet recommendation: Heart healthy diet  Filed Weights   09/13/15 0405 09/15/15 0611 09/16/15 0419  Weight: 72.258 kg (159 lb 4.8 oz) 69.582 kg (153 lb 6.4 oz) 70.897 kg (156 lb 4.8 oz)    History of present illness:  Bobbye Reinitz is a very pleasant 81 y.o. male with a past medical history that includes diabetes, hypertension, myeloproliferative disorder, atrial flutter, chronic diastolic dysfunction presents to the emergency department with the chief complaint of worsening shortness of breath/generalized weakness and myalgia. Initial evaluation in the emergency department reveals hypoxia, urinary tract infection and concern for community-acquired pneumonia.  Information is obtained from the daughter who is at the bedside. She lives with the patient and says that for the last several weeks he has been complaining of generalized achiness and fatigue. Associated symptoms include worsening lower extremity edema, worsening shortness of breath and decreased oral intake.  She reports 4 days ago patient saw his cardiologist at that time Lasix was increased. She also admits to giving patient extra dose of Lasix over the last 2 days as his legs remained edematous. Patient denies abdominal pain nausea vomiting diarrhea constipation. He denies dysuria hematuria frequency or urgency. He denies chest pain palpitations but does endorse aching all over.  Workup in the emergency department includes complete blood counts significant for HCT 53.2 MCH 24.9, comprehensive metabolic panel significant for chloride 97, BUN 26, creatinine 1.68, total bili 1.4 albumin 2.8. Urinalysis consistent with urinary tract infection, chest x-ray reveals diffuse bilateral airspace disease, likely edema. Moderate right effusion with focal right lower lobe opacity which could also reflect edema or infection.  Hospital Course:    Acute respiratory failure with hypoxia Presented with oxygen saturation of 85% on room air. Thought to be secondary to fluid overload. Patient has improved with Lasix. Oxygen saturation is 96% on room air. This is resolved.  Acute combined systolic and diastolic congestive heart failure Patient was noted to have elevated BNP. Pro-calcitonin was not significantly elevated. All of this pointed towards fluid overload.  Patient was started on Lasix. However, patient's creatinine started climbing. Dose of Lasix was reduced.  2-D echo showed LVEF of 25-30% with diffuse hypokinesis new since 2014. Cardiology following. Continue to check daily weights and ins and outs.  Low-dose Coreg initiated by cardiology.  Oral Lasix resumed, patient does not have any shortness of breath. Medications adjusted.  Thrombocytopenia Patient has a known history of myeloproliferative disorder. Platelet count however, is significantly lower than what it was just a few weeks ago. Counts continue to decrease. No overt bleeding.  Discussed with Dr. [name] on 10/25 by Dr.  [place] Pink. He states to  transfuse if counts drop below 10 or if he starts bleeding.  No evidence of active bleeding but patient has bruising all over his upper extremities.  Status post transfusion of 1 unit of apheresis platelets, improved from 13,000 up to 26,000. Patient is stable for now, but he needs follow-up with oncology as outpatient. Patient has MDS.  Moderate right-sided pleural effusion Noted on chest x-ray. Considering the above this is thought to be secondary to congestive heart failure. Chest x-ray was repeated. Once again, bilateral pleural effusions were noted. Right greater than left. Older x-rays have been reviewed. He's had pleural effusions since 2013. Patient does not appear to be overtly symptomatic from same. Effusions are most likely due to CHF.  This is probably improved with diuresis.  Hypotension On 10/20 patient became hypotensive and bradycardic. This was thought to be secondary to medications including his beta blocker, calcium channel blocker and Lasix. Patient had to be given a liter of normal saline. Blood pressure stabilized. His beta blocker and calcium channel blocker were discontinued. Coreg was reinitiated.  Acute on chronic kidney disease stage III Increase in creatinine is due to diuresis. Continue to monitor urine output. Renal ultrasound shows medical renal disease.  No hydronephrosis. Creatinine improved slightly since yesterday. Admitted with creatinine of 1.68, creatinine today is 2.1.  Mildly elevated troponin Slightly elevated troponin, likely demand ischemia, flat curve. Patient does not have any chest pain or any EKG changes.  History of hypothyroidism Continue home medications.  History of atrial flutter Rate is well controlled. Not on anticoagulation due to bleeding risk secondary to thrombocytopenia.  Questionable UTI UA was not particularly impressive for urinary tract infection. No growth on urine cultures. He was initially placed on ceftriaxone. This has  been discontinued.   Mild acute encephalopathy in the setting of baseline dementia Patient has history of dementia at baseline. Sometimes he gets agitated. This is likely secondary to new environment. Reorient daily.   Procedures:  Transfusion of 1 unit of pheresis platelets  Consultations:  None  Discharge Exam: Filed Vitals:   09/16/15 0959  BP: 125/66  Pulse: 88  Temp: 97.8 F (36.6 C)  Resp: 16   General: Alert and awake, oriented x3, not in any acute distress. HEENT: anicteric sclera, pupils reactive to light and accommodation, EOMI CVS: S1-S2 clear, no murmur rubs or gallops Chest: clear to auscultation bilaterally, no wheezing, rales or rhonchi Abdomen: soft nontender, nondistended, normal bowel sounds, no organomegaly Extremities: no cyanosis, clubbing or edema noted bilaterally Neuro: Cranial nerves II-XII intact, no focal neurological deficits  Discharge Instructions   Discharge Instructions    Diet - low sodium heart healthy    Complete by:  As directed      Increase activity slowly    Complete by:  As directed           Current Discharge Medication List    START taking these medications   Details  hydrALAZINE (APRESOLINE) 10 MG tablet Take 1 tablet (10 mg total) by mouth every 8 (eight) hours. Qty: 90 tablet, Refills: 0    isosorbide mononitrate (IMDUR) 30 MG 24 hr tablet Take 0.5 tablets (15 mg total) by mouth daily. Qty: 30 tablet, Refills: 0      CONTINUE these medications which have CHANGED   Details  furosemide (LASIX) 80 MG tablet Take 1 tablet (80 mg total) by mouth daily. Qty: 30 tablet, Refills: 0      CONTINUE these medications which have NOT  CHANGED   Details  aspirin 81 MG tablet Take 81 mg by mouth daily after breakfast.    Associated Diagnoses: Anemia    carvedilol (COREG) 25 MG tablet Take 25 mg by mouth daily. Refills: 0    HYDROcodone-acetaminophen (NORCO/VICODIN) 5-325 MG tablet Take 1 tablet by mouth every 6 (six) hours  as needed for moderate pain.    levothyroxine (SYNTHROID, LEVOTHROID) 88 MCG tablet Take 88 mcg by mouth daily before breakfast.     Multiple Vitamin (MULTIVITAMIN WITH MINERALS) TABS Take 1 tablet by mouth daily.    potassium chloride SA (K-DUR,KLOR-CON) 20 MEQ tablet Take 1 tablet (20 mEq total) by mouth daily. Qty: 30 tablet, Refills: 0    TAZTIA XT 360 MG 24 hr capsule Take 360 mg by mouth daily.    ULORIC 80 MG TABS Take 1 tablet by mouth daily.  Refills: 0      STOP taking these medications     naproxen sodium (ANAPROX) 220 MG tablet        No Known Allergies Follow-up Information    Follow up with [facility].   Specialty:  [place]   Why:  They will do your home health care at your home   Contact information:   [address] [place] [address] [phone]       Follow up with Foye Spurling, MD On 09/19/2015.   Specialty:  Internal Medicine   Why:  post hospital appt. @ 1:00pm .. confirmed w/ Torrie Mayers information:   [address] Kris Hartmann [place] [address] [phone]        The results of significant diagnostics from this hospitalization (including imaging, microbiology, ancillary and laboratory) are listed below for reference.    Significant Diagnostic Studies: Dg Chest 2 View  09/11/2015  CLINICAL DATA:  Pleural effusion, CHF EXAM: CHEST  2 VIEW COMPARISON:  09/07/2015 FINDINGS: Cardiomegaly with mild interstitial edema. Moderate right and small left pleural effusions. No pneumothorax. No interval change. IMPRESSION: Right mid lobe mild interstitial edema. Moderate right and small left pleural effusions. Electronically Signed   By: Julian Hy M.D.   On: 09/11/2015 09:58   Dg Chest 2 View  09/07/2015  CLINICAL DATA:  Weakness. EXAM: CHEST  2 VIEW COMPARISON:  01/04/2013 FINDINGS: Diffuse bilateral airspace opacities are noted. Moderate right effusion with more confluent  opacity at the right lung base. No definite effusion on the left. Heart is mildly enlarged. No acute bony abnormality. IMPRESSION: Diffuse bilateral airspace disease, likely edema. Moderate right effusion with focal right lower lobe opacity which could also reflect edema or infection. Electronically Signed   By: Rolm Baptise M.D.   On: 09/07/2015 11:09   US Renal  09/09/2015  CLINICAL DATA:  Acute renal failure. EXAM: RENAL / URINARY TRACT ULTRASOUND COMPLETE COMPARISON:  None. FINDINGS: Right Kidney: Length: 11.3 cm. Normal renal cortical thickness. Slight increased echogenicity suggesting medical renal disease. No hydronephrosis. Left Kidney: Length: 12.1 cm. Normal renal cortical thickness. Slight increased echogenicity suggesting medical renal disease. No hydronephrosis. Bladder: Poorly distended bladder with apparent wall thickening. Prostate gland enlargement. IMPRESSION: 1. Slight increased echogenicity of both kidneys suggesting medical renal disease. Normal renal cortical thickness and no hydronephrosis. 2. Poorly distended bladder with mild apparent wall thickening. 3. Prostate gland enlargement. Electronically Signed   By: Marijo Sanes M.D.   On: 09/09/2015 13:51    Microbiology: Recent Results (from the past 240 hour(s))  Urine culture     Status: None   Collection Time: 09/07/15  1:50 PM  Result Value Ref Range Status   Specimen Description URINE, CLEAN CATCH  Final   Special Requests Normal  Final   Culture MULTIPLE SPECIES PRESENT, SUGGEST RECOLLECTION  Final   Report Status 09/09/2015 FINAL  Final     Labs: Basic Metabolic Panel:  Recent Labs Lab 09/11/15 0343 09/12/15 0320 09/13/15 0450 09/14/15 0237 09/15/15 0323  NA 139 141 140 142 140  K 4.5 4.4 4.4 4.3 4.5  CL 101 104 103 101 103  CO2 24 25 24 26 28   GLUCOSE 82 86 105* 105* 109*  BUN 48* 46* 52* 57* 58*  CREATININE 1.94* 1.95* 2.20* 2.27* 2.16*  CALCIUM 8.3* 8.1* 8.2* 8.5* 8.3*   Liver Function Tests: No  results for input(s): AST, ALT, ALKPHOS, BILITOT, PROT, ALBUMIN in the last 168 hours. No results for input(s): LIPASE, AMYLASE in the last 168 hours. No results for input(s): AMMONIA in the last 168 hours. CBC:  Recent Labs Lab 09/12/15 0320 09/13/15 0450 09/14/15 0237 09/15/15 0323 09/16/15 1024  WBC 9.3 9.7 8.6 8.5 9.5  HGB 14.1 13.9 14.3 14.3 15.4  HCT 50.5 50.4 52.1* 52.0 54.1*  MCV 89.5 89.4 89.7 89.2 88.3  PLT 23* 15* 13* 26* PENDING   Cardiac Enzymes: No results for input(s): CKTOTAL, CKMB, CKMBINDEX, TROPONINI in the last 168 hours. BNP: BNP (last 3 results)  Recent Labs  09/07/15 1657  BNP 1729.4*    ProBNP (last 3 results) No results for input(s): PROBNP in the last 8760 hours.  CBG:  Recent Labs Lab 09/15/15 0602 09/15/15 1119 09/15/15 1640 09/15/15 2109 09/16/15 0541  GLUCAP 89 105* 95 134* 130*       Signed:  Desarae Placide A  [facility] 09/16/2015, 11:19 AM

## 2015-09-16 NOTE — Telephone Encounter (Signed)
Spoke with daughter and patient has settled down now and he is feeling better Patient just home from hospital today.  Has follow up Monday with PCP Advised daughter to weigh daily and if weight gain of 3 pounds or more overnight, shortness of breath, or swelling call service and MD/PA/NP would call back Will follow up with daughter Monday.  Advised daughter if she did not hear from office Monday, to call back

## 2015-09-17 ENCOUNTER — Observation Stay (HOSPITAL_COMMUNITY): Payer: Medicare Other

## 2015-09-17 ENCOUNTER — Emergency Department (HOSPITAL_COMMUNITY): Payer: Medicare Other

## 2015-09-17 ENCOUNTER — Inpatient Hospital Stay (HOSPITAL_COMMUNITY)
Admission: EM | Admit: 2015-09-17 | Discharge: 2015-09-21 | DRG: 186 | Disposition: A | Discharge status: Skilled Nursing Facility | Payer: Medicare Other | Attending: Internal Medicine | Admitting: Internal Medicine

## 2015-09-17 ENCOUNTER — Encounter (HOSPITAL_COMMUNITY): Payer: Self-pay | Admitting: *Deleted

## 2015-09-17 DIAGNOSIS — I5043 Acute on chronic combined systolic (congestive) and diastolic (congestive) heart failure: Secondary | ICD-10-CM | POA: Diagnosis present

## 2015-09-17 DIAGNOSIS — E039 Hypothyroidism, unspecified: Secondary | ICD-10-CM | POA: Diagnosis present

## 2015-09-17 DIAGNOSIS — C946 Myelodysplastic disease, not classified: Secondary | ICD-10-CM | POA: Diagnosis present

## 2015-09-17 DIAGNOSIS — Z87891 Personal history of nicotine dependence: Secondary | ICD-10-CM | POA: Diagnosis not present

## 2015-09-17 DIAGNOSIS — E43 Unspecified severe protein-calorie malnutrition: Secondary | ICD-10-CM | POA: Diagnosis present

## 2015-09-17 DIAGNOSIS — Z7982 Long term (current) use of aspirin: Secondary | ICD-10-CM

## 2015-09-17 DIAGNOSIS — J90 Pleural effusion, not elsewhere classified: Principal | ICD-10-CM | POA: Diagnosis present

## 2015-09-17 DIAGNOSIS — M1990 Unspecified osteoarthritis, unspecified site: Secondary | ICD-10-CM | POA: Diagnosis present

## 2015-09-17 DIAGNOSIS — Z7189 Other specified counseling: Secondary | ICD-10-CM | POA: Diagnosis not present

## 2015-09-17 DIAGNOSIS — D696 Thrombocytopenia, unspecified: Secondary | ICD-10-CM | POA: Diagnosis present

## 2015-09-17 DIAGNOSIS — N183 Chronic kidney disease, stage 3 (moderate): Secondary | ICD-10-CM | POA: Diagnosis present

## 2015-09-17 DIAGNOSIS — J948 Other specified pleural conditions: Secondary | ICD-10-CM | POA: Diagnosis not present

## 2015-09-17 DIAGNOSIS — Z79899 Other long term (current) drug therapy: Secondary | ICD-10-CM | POA: Diagnosis not present

## 2015-09-17 DIAGNOSIS — R627 Adult failure to thrive: Secondary | ICD-10-CM | POA: Diagnosis present

## 2015-09-17 DIAGNOSIS — I480 Paroxysmal atrial fibrillation: Secondary | ICD-10-CM | POA: Diagnosis present

## 2015-09-17 DIAGNOSIS — Z8042 Family history of malignant neoplasm of prostate: Secondary | ICD-10-CM | POA: Diagnosis not present

## 2015-09-17 DIAGNOSIS — N289 Disorder of kidney and ureter, unspecified: Secondary | ICD-10-CM

## 2015-09-17 DIAGNOSIS — F0390 Unspecified dementia without behavioral disturbance: Secondary | ICD-10-CM | POA: Diagnosis present

## 2015-09-17 DIAGNOSIS — R0600 Dyspnea, unspecified: Secondary | ICD-10-CM

## 2015-09-17 DIAGNOSIS — Z8673 Personal history of transient ischemic attack (TIA), and cerebral infarction without residual deficits: Secondary | ICD-10-CM | POA: Diagnosis not present

## 2015-09-17 DIAGNOSIS — M109 Gout, unspecified: Secondary | ICD-10-CM | POA: Diagnosis present

## 2015-09-17 DIAGNOSIS — Z66 Do not resuscitate: Secondary | ICD-10-CM | POA: Diagnosis present

## 2015-09-17 DIAGNOSIS — I959 Hypotension, unspecified: Secondary | ICD-10-CM | POA: Diagnosis not present

## 2015-09-17 DIAGNOSIS — I4892 Unspecified atrial flutter: Secondary | ICD-10-CM | POA: Diagnosis present

## 2015-09-17 DIAGNOSIS — E1122 Type 2 diabetes mellitus with diabetic chronic kidney disease: Secondary | ICD-10-CM | POA: Diagnosis present

## 2015-09-17 DIAGNOSIS — I080 Rheumatic disorders of both mitral and aortic valves: Secondary | ICD-10-CM | POA: Diagnosis present

## 2015-09-17 DIAGNOSIS — N400 Enlarged prostate without lower urinary tract symptoms: Secondary | ICD-10-CM | POA: Diagnosis present

## 2015-09-17 DIAGNOSIS — Z515 Encounter for palliative care: Secondary | ICD-10-CM

## 2015-09-17 DIAGNOSIS — I130 Hypertensive heart and chronic kidney disease with heart failure and stage 1 through stage 4 chronic kidney disease, or unspecified chronic kidney disease: Secondary | ICD-10-CM | POA: Diagnosis present

## 2015-09-17 DIAGNOSIS — J9601 Acute respiratory failure with hypoxia: Secondary | ICD-10-CM | POA: Diagnosis present

## 2015-09-17 DIAGNOSIS — R0902 Hypoxemia: Secondary | ICD-10-CM | POA: Diagnosis not present

## 2015-09-17 DIAGNOSIS — J449 Chronic obstructive pulmonary disease, unspecified: Secondary | ICD-10-CM | POA: Diagnosis present

## 2015-09-17 DIAGNOSIS — I472 Ventricular tachycardia: Secondary | ICD-10-CM | POA: Diagnosis not present

## 2015-09-17 DIAGNOSIS — I5042 Chronic combined systolic (congestive) and diastolic (congestive) heart failure: Secondary | ICD-10-CM | POA: Diagnosis not present

## 2015-09-17 DIAGNOSIS — D649 Anemia, unspecified: Secondary | ICD-10-CM

## 2015-09-17 HISTORY — DX: Dependence on other enabling machines and devices: Z99.89

## 2015-09-17 LAB — CBC WITH DIFFERENTIAL/PLATELET
BASOS ABS: 0.1 10*3/uL (ref 0.0–0.1)
BASOS PCT: 1 %
EOS ABS: 0 10*3/uL (ref 0.0–0.7)
Eosinophils Relative: 0 %
HEMATOCRIT: 53.3 % — AB (ref 39.0–52.0)
Hemoglobin: 14.8 g/dL (ref 13.0–17.0)
Lymphocytes Relative: 16 %
Lymphs Abs: 1.5 10*3/uL (ref 0.7–4.0)
MCH: 25 pg — ABNORMAL LOW (ref 26.0–34.0)
MCHC: 27.8 g/dL — AB (ref 30.0–36.0)
MCV: 89.9 fL (ref 78.0–100.0)
Monocytes Absolute: 1.6 10*3/uL — ABNORMAL HIGH (ref 0.1–1.0)
Monocytes Relative: 17 %
NEUTROS PCT: 67 %
Neutro Abs: 6.5 10*3/uL (ref 1.7–7.7)
PLATELETS: 9 10*3/uL — AB (ref 150–400)
RBC: 5.93 MIL/uL — AB (ref 4.22–5.81)
RDW: 25.6 % — AB (ref 11.5–15.5)
WBC: 9.7 10*3/uL (ref 4.0–10.5)

## 2015-09-17 LAB — URINALYSIS, ROUTINE W REFLEX MICROSCOPIC
BILIRUBIN URINE: NEGATIVE
Glucose, UA: NEGATIVE mg/dL
KETONES UR: NEGATIVE mg/dL
Leukocytes, UA: NEGATIVE
Nitrite: NEGATIVE
Protein, ur: 30 mg/dL — AB
SPECIFIC GRAVITY, URINE: 1.014 (ref 1.005–1.030)
UROBILINOGEN UA: 0.2 mg/dL (ref 0.0–1.0)
pH: 5.5 (ref 5.0–8.0)

## 2015-09-17 LAB — TYPE AND SCREEN
ABO/RH(D): O POS
Antibody Screen: NEGATIVE

## 2015-09-17 LAB — BASIC METABOLIC PANEL
ANION GAP: 6 (ref 5–15)
BUN: 54 mg/dL — ABNORMAL HIGH (ref 6–20)
CALCIUM: 8.4 mg/dL — AB (ref 8.9–10.3)
CO2: 30 mmol/L (ref 22–32)
Chloride: 108 mmol/L (ref 101–111)
Creatinine, Ser: 1.65 mg/dL — ABNORMAL HIGH (ref 0.61–1.24)
GFR, EST AFRICAN AMERICAN: 42 mL/min — AB (ref >60)
GFR, EST NON AFRICAN AMERICAN: 36 mL/min — AB (ref >60)
Glucose, Bld: 90 mg/dL (ref 65–99)
POTASSIUM: 4.2 mmol/L (ref 3.5–5.1)
Sodium: 144 mmol/L (ref 135–145)

## 2015-09-17 LAB — URINE MICROSCOPIC-ADD ON

## 2015-09-17 LAB — CBC
HCT: 49.2 % (ref 39.0–52.0)
Hemoglobin: 13.8 g/dL (ref 13.0–17.0)
MCH: 24.9 pg — AB (ref 26.0–34.0)
MCHC: 28 g/dL — ABNORMAL LOW (ref 30.0–36.0)
MCV: 88.8 fL (ref 78.0–100.0)
Platelets: 7 10*3/uL — CL (ref 150–400)
RBC: 5.54 MIL/uL (ref 4.22–5.81)
RDW: 25.6 % — AB (ref 11.5–15.5)
WBC: 8.9 10*3/uL (ref 4.0–10.5)

## 2015-09-17 LAB — TROPONIN I

## 2015-09-17 LAB — BRAIN NATRIURETIC PEPTIDE: B Natriuretic Peptide: 1199.9 pg/mL — ABNORMAL HIGH (ref 0.0–100.0)

## 2015-09-17 MED ORDER — FUROSEMIDE 40 MG PO TABS
80.0000 mg | ORAL_TABLET | Freq: Every day | ORAL | Status: DC
  Administered 2015-09-17 – 2015-09-19 (×3): 80 mg via ORAL
  Filled 2015-09-17 (×3): qty 2

## 2015-09-17 MED ORDER — LEVALBUTEROL HCL 0.63 MG/3ML IN NEBU: 0.6300 mg | INHALATION_SOLUTION | Freq: Four times a day (QID) | RESPIRATORY_TRACT | Status: DC | PRN

## 2015-09-17 MED ORDER — SODIUM CHLORIDE 0.9 % IV SOLN
Freq: Once | INTRAVENOUS | Status: AC
  Administered 2015-09-21: 16:00:00 via INTRAVENOUS

## 2015-09-17 MED ORDER — ISOSORBIDE MONONITRATE ER 30 MG PO TB24
15.0000 mg | ORAL_TABLET | Freq: Every day | ORAL | Status: DC
  Administered 2015-09-17 – 2015-09-19 (×3): 15 mg via ORAL
  Filled 2015-09-17 (×3): qty 1

## 2015-09-17 MED ORDER — CARVEDILOL 25 MG PO TABS
25.0000 mg | ORAL_TABLET | Freq: Two times a day (BID) | ORAL | Status: DC
  Administered 2015-09-18 – 2015-09-21 (×8): 25 mg via ORAL
  Filled 2015-09-17 (×8): qty 1

## 2015-09-17 MED ORDER — ONDANSETRON HCL 4 MG/2ML IJ SOLN: 4.0000 mg | Freq: Four times a day (QID) | INTRAMUSCULAR | Status: DC | PRN

## 2015-09-17 MED ORDER — DILTIAZEM HCL ER BEADS 240 MG PO CP24
360.0000 mg | ORAL_CAPSULE | Freq: Every day | ORAL | Status: DC
  Administered 2015-09-17 – 2015-09-19 (×3): 360 mg via ORAL
  Filled 2015-09-17 (×9): qty 1

## 2015-09-17 MED ORDER — SODIUM CHLORIDE 0.9 % IJ SOLN
3.0000 mL | Freq: Two times a day (BID) | INTRAMUSCULAR | Status: DC
  Administered 2015-09-17 – 2015-09-21 (×6): 3 mL via INTRAVENOUS

## 2015-09-17 MED ORDER — ONDANSETRON HCL 4 MG PO TABS: 4.0000 mg | ORAL_TABLET | Freq: Four times a day (QID) | ORAL | Status: DC | PRN

## 2015-09-17 MED ORDER — HYDRALAZINE HCL 10 MG PO TABS
10.0000 mg | ORAL_TABLET | Freq: Three times a day (TID) | ORAL | Status: DC
  Administered 2015-09-17 – 2015-09-18 (×3): 10 mg via ORAL
  Filled 2015-09-17 (×3): qty 1

## 2015-09-17 MED ORDER — LEVOTHYROXINE SODIUM 88 MCG PO TABS
88.0000 ug | ORAL_TABLET | Freq: Every day | ORAL | Status: DC
  Administered 2015-09-18 – 2015-09-21 (×4): 88 ug via ORAL
  Filled 2015-09-17 (×4): qty 1

## 2015-09-17 NOTE — H&P (Addendum)
[facility] History and Physical  Orvin Netter JQZ:[id number] DOB: Nov 08, 1927 DOA: 09/17/2015  Referring physician: ED physician, [place] PCP: Foye Spurling, MD   Chief Complaint: dyspnea   HPI:  81 y.o. male with a past medical history that includes diabetes, hypertension, myeloproliferative disorder, atrial flutter, chronic diastolic dysfunction, discharged one day PTA after being hospitalized for CHF, now came back with main concern of persistent weakness, dyspnea at rest and with exertion, difficulty getting out of the bed. No reported fevers, chills, chest pain, no specific focal neurological symptoms.  In Ed, blood work notable for Plt 9, Cr down from 2.16 --> 1.65, imaging studies notable for bilateral pleural effusions. TRH asked to admit for further evaluation.    ASSESSMENT AND PLAN: Acute respiratory failure with hypoxia - Presented with oxygen saturation of 85% on room air. - secondary to bilateral pleural effusions - CT chest requested for clearer evaluation - continue home regimen with lasix   Acute combined systolic and diastolic congestive heart failure - 2-D echo showed LVEF of 25-30% with diffuse hypokinesis new since 2014. - Low-dose Coreg initiated by cardiology on last admission  - resume lasix   Thrombocytopenia - Patient has a known history of myeloproliferative disorder. Platelet count however, is significantly lower than what it was just a few weeks ago - transfuse one unit of Plt  - CBC in AM  Chronic kidney disease stage III - Cr down since last admission - BMP in AM  History of hypothyroidism - Continue home medications.  History of atrial flutter - Rate is well controlled. Not on anticoagulation due to bleeding risk secondary to thrombocytopenia.  DVT prophylaxis - SCD  Radiological Exams on Admission: Dg Chest 2 View  09/17/2015  CLINICAL DATA:  Pt and family report that pt was admitted for SOB and fluid on the lungs from  10/19-10/28, pt started having SOB Friday night and continues today. Pt denies pain. Family reports legs are swollen and belly is distended. H/o CHF EXAM: CHEST - 2 VIEW COMPARISON:  09/11/2015 FINDINGS: Stable cardiomegaly. Central pulmonary vascular congestion with some improvement in the interstitial edema seen previously. Moderate right and tiny left pleural effusions. Atelectasis/ consolidation in the lung bases right greater than left. Atheromatous aorta. Visualized skeletal structures are unremarkable. IMPRESSION: 1. Partial improvement in the interstitial edema and vascular congestion, with persistent effusions and bibasilar atelectasis/consolidation, right greater than left. Electronically Signed   By: Lucrezia Europe M.D.   On: 09/17/2015 15:02   Ct Chest Wo Contrast  09/17/2015  CLINICAL DATA:  Shortness of breath, lower extremity swelling, abdominal distention. EXAM: CT CHEST WITHOUT CONTRAST TECHNIQUE: Multidetector CT imaging of the chest was performed following the standard protocol without IV contrast. COMPARISON:  None. Chest CT dated 09/17/2015 FINDINGS: Mediastinum/Lymph Nodes: No masses or pathologically enlarged lymph nodes identified on this un-enhanced exam. The heart is markedly enlarged. There is no evidence of significant pericardial effusion. Atherosclerotic calcifications are noted of the coronary arteries, aorta and aortic valve annulus. The aorta is torturous without evidence of aneurysmal dilation. Lungs/Pleura: There are moderate in size bilateral pleural effusions with subsegmental atelectasis. There is also mild interstitial and alveolar pulmonary edema. No evidence of focal airspace consolidation. Upper abdomen: No acute findings. Musculoskeletal: No chest wall mass or suspicious bone lesions identified. IMPRESSION: Markedly enlarged heart with atherosclerotic disease of the coronary arteries and aorta. Calcifications of the aortic valve annulus. Moderate in size bilateral pleural  effusions, and mild pulmonary edema. Electronically Signed   By: Thomas Hoff  Dimitrova M.D.   On: 09/17/2015 17:21     Code Status: Full Family Communication: Pt and family at bedside Disposition Plan: Admit for further evaluation    Mart Piggs [facility] [phone]   Review of Systems:  Constitutional: Negative for diaphoresis.  HENT: Negative for hearing loss, ear pain, nosebleeds, congestion, sore throat, neck pain, tinnitus and ear discharge.   Eyes: Negative for blurred vision, double vision, photophobia, pain, discharge and redness.  Respiratory: Negative for cough, hemoptysis, sputum production, shortness of breath, wheezing and stridor.   Cardiovascular: Negative for chest pain Gastrointestinal: Negative for heartburn, constipation, blood in stool and melena.  Genitourinary: Negative for dysuria, urgency, frequency, hematuria and flank pain.  Musculoskeletal: Negative for myalgias, back pain, joint pain and falls.  Skin: Negative for itching and rash.  Neurological: NNegative for tingling, tremors, sensory change, speech change, focal weakness, loss of consciousness and headaches.  Endo/Heme/Allergies: Negative for environmental allergies and polydipsia. Does not bruise/bleed easily.  Psychiatric/Behavioral: Negative for suicidal ideas. The patient is not nervous/anxious.      Past Medical History  Diagnosis Date  . Anemia   . Hypothyroidism   . Hypertension   . Myeloproliferative disorder ([place]) 06/30/2012  . Paroxysmal atrial flutter ([place]) 2014  . Chronic combined systolic and diastolic CHF (congestive heart failure) ([place])     a. EF in 2014 55-60% b. EF in 08/2015 25-30%  . Gout   . BPH (benign prostatic hypertrophy)   . COPD (chronic obstructive pulmonary disease) ([place])   . Bone marrow disorder   . CVA (cerebral vascular accident) ([place])   . Type II diabetes mellitus ([place])   . History of blood transfusion "several"  . Dementia   . Arthritis     "mostly in joints"  (09/07/2015)  . Use of cane as ambulatory aid     Past Surgical History  Procedure Laterality Date  . Appendectomy    . Thyroid surgery    . Cyst removal neck  2009    Social History:  reports that he has quit smoking. His smoking use included Cigarettes and Cigars. His smokeless tobacco use includes Chew. He reports that he does not drink alcohol or use illicit drugs.  No Known Allergies  Family History  Problem Relation Age of Onset  . Heart disease Neg Hx   . Heart failure Neg Hx   . Prostate cancer      Prior to Admission medications   Medication Sig Start Date End Date Taking? Authorizing Provider  acetaminophen (TYLENOL) 500 MG tablet Take 500 mg by mouth every 6 (six) hours as needed for mild pain.   Yes Historical Provider, MD  aspirin 81 MG tablet Take 81 mg by mouth daily after breakfast.    Yes Historical Provider, MD  carvedilol (COREG) 25 MG tablet Take 25 mg by mouth 2 (two) times daily with a meal.  08/07/14  Yes Historical Provider, MD  furosemide (LASIX) 80 MG tablet Take 1 tablet (80 mg total) by mouth daily. 09/16/15  Yes Verlee Monte, MD  hydrALAZINE (APRESOLINE) 10 MG tablet Take 1 tablet (10 mg total) by mouth every 8 (eight) hours. 09/16/15  Yes Verlee Monte, MD  isosorbide mononitrate (IMDUR) 30 MG 24 hr tablet Take 0.5 tablets (15 mg total) by mouth daily. 09/16/15  Yes Verlee Monte, MD  levothyroxine (SYNTHROID, LEVOTHROID) 88 MCG tablet Take 88 mcg by mouth daily before breakfast.    Yes Historical Provider, MD  Multiple Vitamin (MULTIVITAMIN WITH MINERALS) TABS Take 1 tablet  by mouth daily.   Yes Historical Provider, MD  potassium chloride SA (K-DUR,KLOR-CON) 20 MEQ tablet Take 1 tablet (20 mEq total) by mouth daily. 01/12/15  Yes Wyatt Portela, MD  TAZTIA XT 360 MG 24 hr capsule Take 360 mg by mouth daily. 08/02/13  Yes Historical Provider, MD  ULORIC 80 MG TABS Take 1 tablet by mouth daily.  04/21/15  Yes Historical Provider, MD    Physical Exam: Filed  Vitals:   09/17/15 1354 09/17/15 1601 09/17/15 1724 09/17/15 1746  BP: 131/65 131/73 124/70 124/75  Pulse: 94 82 89 94  Temp: 97.7 F (36.5 C)   97.4 F (36.3 C)  TempSrc: Oral   Oral  Resp: 21 23 18 18   Height:  5\' 8"  (1.727 m)    Weight:  70.788 kg (156 lb 1 oz)    SpO2: 94% 92% 100% 100%    Physical Exam  Constitutional: Appears tired. No distress.  HENT: Normocephalic. External right and left ear normal. Dry MM Eyes: Conjunctivae and EOM are normal. PERRLA, no scleral icterus.  Neck: Normal ROM. Neck supple. No JVD. No tracheal deviation. No thyromegaly.  CVS: RRR, no gallops, no carotid bruit.  Pulmonary: Effort and breath sounds normal, bilateral crackles  Abdominal: Soft. BS +,  no distension, tenderness, rebound or guarding.  Musculoskeletal: Normal range of motion.  Lymphadenopathy: No lymphadenopathy noted, cervical, inguinal. Neuro: Alert. Normal reflexes, muscle tone coordination.  Skin: Skin is warm and dry. No rash noted. Not diaphoretic. No erythema. No pallor.  Psychiatric: Normal mood and affect.  Labs on Admission:  Basic Metabolic Panel:  Recent Labs Lab 09/12/15 0320 09/13/15 0450 09/14/15 0237 09/15/15 0323 09/17/15 1440  NA 141 140 142 140 144  K 4.4 4.4 4.3 4.5 4.2  CL 104 103 101 103 108  CO2 25 24 26 28 30   GLUCOSE 86 105* 105* 109* 90  BUN 46* 52* 57* 58* 54*  CREATININE 1.95* 2.20* 2.27* 2.16* 1.65*  CALCIUM 8.1* 8.2* 8.5* 8.3* 8.4*   CBC:  Recent Labs Lab 09/13/15 0450 09/14/15 0237 09/15/15 0323 09/16/15 1024 09/17/15 1440  WBC 9.7 8.6 8.5 9.5 9.7  NEUTROABS  --   --   --   --  6.5  HGB 13.9 14.3 14.3 15.4 14.8  HCT 50.4 52.1* 52.0 54.1* 53.3*  MCV 89.4 89.7 89.2 88.3 89.9  PLT 15* 13* 26* 12* 9*   Cardiac Enzymes:  Recent Labs Lab 09/17/15 1440  TROPONINI <0.03   CBG:  Recent Labs Lab 09/15/15 1119 09/15/15 1640 09/15/15 2109 09/16/15 0541 09/16/15 1106  GLUCAP 105* 95 134* 130* 86    EKG: pending    If  7PM-7AM, please contact night-coverage [email or web address] Password [email or web address] 09/17/2015, 6:11 PM

## 2015-09-17 NOTE — Progress Notes (Signed)
Received patient from ED for readmission due to chronic shortness of breath. Pt was discharged from [facility] yesterday for same. Family states pt showed no improvement, unable to ambulate witthout assistance due to weakness. Pt awake and alert has no complaints except for hunger and being tired.

## 2015-09-17 NOTE — ED Provider Notes (Signed)
CSN: [id number]     Arrival date & time 09/17/15  1348 History   First MD Initiated Contact with Patient 09/17/15 1411     Chief Complaint  Patient presents with  . Shortness of Breath      Patient is a 81 y.o. male presenting with shortness of breath. The history is provided by a relative and the patient. The history is limited by the condition of the patient (Hx dementia).  Shortness of Breath Pt was seen at 1415. Per pt's family: Pt was discharged from the hospital yesterday for dx CHF. Since yesterday, pt has progressively become generally weak and now is unable to stand up on his own today. Pt's family did not weigh pt today. Pt's family states pt's "legs look swollen" and he is "more SOB." Denies falls, no syncope, no abd pain, no vomiting/diarrhea, no CP, no cough, no fevers, no focal motor weakness.    Past Medical History  Diagnosis Date  . Anemia   . Hypothyroidism   . Hypertension   . Myeloproliferative disorder ([place]) 06/30/2012  . Paroxysmal atrial flutter ([place]) 2014  . Chronic combined systolic and diastolic CHF (congestive heart failure) ([place])     a. EF in 2014 55-60% b. EF in 08/2015 25-30%  . Gout   . BPH (benign prostatic hypertrophy)   . COPD (chronic obstructive pulmonary disease) ([place])   . Bone marrow disorder   . CVA (cerebral vascular accident) ([place])   . Type II diabetes mellitus ([place])   . History of blood transfusion "several"  . Dementia   . Arthritis     "mostly in joints" (09/07/2015)  . Use of cane as ambulatory aid    Past Surgical History  Procedure Laterality Date  . Appendectomy    . Thyroid surgery    . Cyst removal neck  2009   Family History  Problem Relation Age of Onset  . Heart disease Neg Hx   . Heart failure Neg Hx   . Prostate cancer     Social History  Substance Use Topics  . Smoking status: Former Smoker    Types: Cigarettes, Cigars  . Smokeless tobacco: Current User    Types: Chew     Comment: 09/07/2015 "don't know of  any cigarettes/cigar smoking"  . Alcohol Use: No    Review of Systems  Unable to perform ROS: Dementia  Respiratory: Positive for shortness of breath.       Allergies  Review of patient's allergies indicates no known allergies.  Home Medications   Prior to Admission medications   Medication Sig Start Date End Date Taking? Authorizing Provider  aspirin 81 MG tablet Take 81 mg by mouth daily after breakfast.     Historical Provider, MD  carvedilol (COREG) 25 MG tablet Take 25 mg by mouth daily. 08/07/14   Historical Provider, MD  furosemide (LASIX) 80 MG tablet Take 1 tablet (80 mg total) by mouth daily. 09/16/15   Verlee Monte, MD  hydrALAZINE (APRESOLINE) 10 MG tablet Take 1 tablet (10 mg total) by mouth every 8 (eight) hours. 09/16/15   Verlee Monte, MD  HYDROcodone-acetaminophen (NORCO/VICODIN) 5-325 MG tablet Take 1 tablet by mouth every 6 (six) hours as needed for moderate pain.    Historical Provider, MD  isosorbide mononitrate (IMDUR) 30 MG 24 hr tablet Take 0.5 tablets (15 mg total) by mouth daily. 09/16/15   Verlee Monte, MD  levothyroxine (SYNTHROID, LEVOTHROID) 88 MCG tablet Take 88 mcg by mouth daily before breakfast.  Historical Provider, MD  Multiple Vitamin (MULTIVITAMIN WITH MINERALS) TABS Take 1 tablet by mouth daily.    Historical Provider, MD  potassium chloride SA (K-DUR,KLOR-CON) 20 MEQ tablet Take 1 tablet (20 mEq total) by mouth daily. 01/12/15   Wyatt Portela, MD  TAZTIA XT 360 MG 24 hr capsule Take 360 mg by mouth daily. 08/02/13   Historical Provider, MD  ULORIC 80 MG TABS Take 1 tablet by mouth daily.  04/21/15   Historical Provider, MD   BP 131/65 mmHg  Pulse 94  Temp(Src) 97.7 F (36.5 C) (Oral)  Resp 21  SpO2 94%     BP 131/73 mmHg  Pulse 82  Temp(Src) 97.7 F (36.5 C) (Oral)  Resp 23  Ht 5\' 8"  (1.727 m)  Wt 156 lb 1 oz (70.788 kg)  BMI 23.73 kg/m2  SpO2 92%   15:45 Orthostatic Vital Signs VJ  Orthostatic Lying  - BP- Lying: 128/80 mmHg ;  Pulse- Lying: 102  Orthostatic Sitting - BP- Sitting: 137/66 mmHg ; Pulse- Sitting: 89  Orthostatic Standing at 0 minutes - BP- Standing at 0 minutes: 141/72 mmHg ; Pulse- Standing at 0 minutes: 85       Physical Exam  1420: Physical examination:  Nursing notes reviewed; Vital signs and O2 SAT reviewed;  Constitutional: Thin, frail. In no acute distress; Head:  Normocephalic, atraumatic; Eyes: EOMI, PERRL, No scleral icterus; ENMT: Mouth and pharynx normal, Mucous membranes dry; Neck: Supple, Full range of motion, No lymphadenopathy; Cardiovascular: Regular rate and rhythm, No gallop; Respiratory: Breath sounds coarse & equal bilaterally, No wheezes. Normal respiratory effort/excursion; Chest: Nontender, Movement normal; Abdomen: Soft, Nontender, Nondistended, Normal bowel sounds; Genitourinary: No CVA tenderness; Extremities: Pulses normal, No tenderness, +1 pedal edema bilat. No calf asymmetry.; Neuro: Awake/alert, confused per hx dementia. No facial droop. Speech clear. Grips equal. Pt moves all extremities spontaneously and to command without apparent gross focal motor deficits.; Skin: Color normal, Warm, Dry.   ED Course  Procedures (including critical care time) Labs Review   Imaging Review  I have personally reviewed and evaluated these images and lab results as part of my medical decision-making.   EKG Interpretation   Date/Time:  Saturday September 17 2015 14:01:32 EDT Ventricular Rate:  90 PR Interval:  170 QRS Duration: 73 QT Interval:  376 QTC Calculation: 460 R Axis:   -53 Text Interpretation:  Sinus rhythm Left axis deviation Left anterior  fasicular block Paired ventricular premature complexes Aberrant conduction  of SV complex(es) Abnormal R-wave progression, late transition T wave  abnormality Lateral leads Inferior infarct, old Baseline wander Artifact  When compared with ECG of 09/09/2015 T wave abnormality Lateral leads is  now Present Confirmed by [facility]  MD,  Nunzio Cory [phone]) on 09/17/2015  2:47:00 PM      MDM  MDM Reviewed: previous chart, nursing note and vitals Reviewed previous: labs and ECG Interpretation: labs, ECG and x-ray      Results for orders placed or performed during the hospital encounter of 56/43/32  Basic metabolic panel  Result Value Ref Range   Sodium 144 135 - 145 mmol/L   Potassium 4.2 3.5 - 5.1 mmol/L   Chloride 108 101 - 111 mmol/L   CO2 30 22 - 32 mmol/L   Glucose, Bld 90 65 - 99 mg/dL   BUN 54 (H) 6 - 20 mg/dL   Creatinine, Ser 1.65 (H) 0.61 - 1.24 mg/dL   Calcium 8.4 (L) 8.9 - 10.3 mg/dL   GFR calc non Af  Amer 36 (L) >60 mL/min   GFR calc Af Amer 42 (L) >60 mL/min   Anion gap 6 5 - 15  Brain natriuretic peptide  Result Value Ref Range   B Natriuretic Peptide 1199.9 (H) 0.0 - 100.0 pg/mL  Troponin I  Result Value Ref Range   Troponin I <0.03 <0.031 ng/mL  CBC with Differential  Result Value Ref Range   WBC 9.7 4.0 - 10.5 K/uL   RBC 5.93 (H) 4.22 - 5.81 MIL/uL   Hemoglobin 14.8 13.0 - 17.0 g/dL   HCT 53.3 (H) 39.0 - 52.0 %   MCV 89.9 78.0 - 100.0 fL   MCH 25.0 (L) 26.0 - 34.0 pg   MCHC 27.8 (L) 30.0 - 36.0 g/dL   RDW 25.6 (H) 11.5 - 15.5 %   Platelets 9 (LL) 150 - 400 K/uL   Neutrophils Relative % 67 %   Neutro Abs 6.5 1.7 - 7.7 K/uL   Lymphocytes Relative 16 %   Lymphs Abs 1.5 0.7 - 4.0 K/uL   Monocytes Relative 17 %   Monocytes Absolute 1.6 (H) 0.1 - 1.0 K/uL   Eosinophils Relative 0 %   Eosinophils Absolute 0.0 0.0 - 0.7 K/uL   Basophils Relative 1 %   Basophils Absolute 0.1 0.0 - 0.1 K/uL   RBC Morphology RARE NRBCs    Smear Review GIANT PLATELETS SEEN    Dg Chest 2 View 09/17/2015  CLINICAL DATA:  Pt and family report that pt was admitted for SOB and fluid on the lungs from 10/19-10/28, pt started having SOB Friday night and continues today. Pt denies pain. Family reports legs are swollen and belly is distended. H/o CHF EXAM: CHEST - 2 VIEW COMPARISON:  09/11/2015 FINDINGS: Stable  cardiomegaly. Central pulmonary vascular congestion with some improvement in the interstitial edema seen previously. Moderate right and tiny left pleural effusions. Atelectasis/ consolidation in the lung bases right greater than left. Atheromatous aorta. Visualized skeletal structures are unremarkable. IMPRESSION: 1. Partial improvement in the interstitial edema and vascular congestion, with persistent effusions and bibasilar atelectasis/consolidation, right greater than left. Electronically Signed   By: Lucrezia Europe M.D.   On: 09/17/2015 15:02    Results for Kyle Heath, Kyle Heath (MRN [id number]) as of 09/18/2015 08:00  Ref. Range 09/15/2015 03:23 09/16/2015 10:24 09/17/2015 14:40  Platelets Latest Ref Range: 150-400 K/uL 26 (LL) 12 (LL) 9 (LL)   Results for Kyle Heath, Kyle Heath (MRN [id number]) as of 09/18/2015 08:00  Ref. Range 09/14/2015 02:37 09/15/2015 03:23 09/17/2015 14:40  BUN Latest Ref Range: 6-20 mg/dL 57 (H) 58 (H) 54 (H)  Creatinine Latest Ref Range: 0.61-1.24 mg/dL 2.27 (H) 2.16 (H) 1.65 (H)   Results for Kyle Heath, Kyle Heath (MRN [id number]) as of 09/18/2015 09:20  Ref. Range 09/07/2015 16:57 09/17/2015 14:40  B Natriuretic Peptide Latest Ref Range: 0.0-100.0 pg/mL 1729.4 (H) 1199.9 (H)    1610:  Pt not orthostatic on VS, but needed heavy x2 assist to stand and was unsteady while standing (pt walks with cane at baseline). Pt O2 Sat dropped to 83% R/A while moving on stretcher from laying to sitting. Pt's O2 Sat improved while pt sat still, but then decreased into 80's R/A again when moving from sitting to standing. BUN/Cr and BNP elevated, but slightly improved from previous. Platelets gradually decreasing as above, no active bleeding and family/pt denies any bleeding at home; will monitor at this time. CXR with continued vascular congestion. Will obtain weight on pt, as well as CT chest without contrast to further delineate pleural  effusions seen on CXR. T/C to [facility] Dr. Doyle Askew, case discussed, including:   HPI, pertinent PM/SHx, VS/PE, dx testing, ED course and treatment:  Agreeable to admit, requests to write temporary orders, obtain observation tele bed to team WLAdmits.     Francine Graven, DO 09/21/15 [phone]

## 2015-09-17 NOTE — ED Notes (Signed)
Pt and family report that pt was admitted for SOB and fluid on the lungs from 10/19-10/28, pt started having SOB Friday night and continues today. Pt denies pain. Family reports legs are swollen and belly is distended.

## 2015-09-17 NOTE — ED Notes (Signed)
Bed: MN81 Expected date:  Expected time:  Means of arrival:  Comments: Triage 1 [place]

## 2015-09-17 NOTE — ED Notes (Signed)
To [facility] [place] at this time.  He has had his CT and is in no distress.

## 2015-09-17 NOTE — ED Notes (Signed)
O2 Sat dropped to 87 while standing

## 2015-09-18 ENCOUNTER — Inpatient Hospital Stay (HOSPITAL_COMMUNITY): Payer: Medicare Other

## 2015-09-18 LAB — BODY FLUID CELL COUNT WITH DIFFERENTIAL
EOS FL: 1 %
Lymphs, Fluid: 82 %
MONOCYTE-MACROPHAGE-SEROUS FLUID: 7 % — AB (ref 50–90)
Neutrophil Count, Fluid: 10 % (ref 0–25)
Total Nucleated Cell Count, Fluid: 1646 cu mm — ABNORMAL HIGH (ref 0–1000)

## 2015-09-18 LAB — PROTEIN, BODY FLUID

## 2015-09-18 LAB — BASIC METABOLIC PANEL
Anion gap: 9 (ref 5–15)
BUN: 48 mg/dL — ABNORMAL HIGH (ref 6–20)
CALCIUM: 8.2 mg/dL — AB (ref 8.9–10.3)
CO2: 28 mmol/L (ref 22–32)
CREATININE: 1.51 mg/dL — AB (ref 0.61–1.24)
Chloride: 107 mmol/L (ref 101–111)
GFR calc Af Amer: 47 mL/min — ABNORMAL LOW (ref >60)
GFR calc non Af Amer: 40 mL/min — ABNORMAL LOW (ref >60)
GLUCOSE: 79 mg/dL (ref 65–99)
Potassium: 4.1 mmol/L (ref 3.5–5.1)
Sodium: 144 mmol/L (ref 135–145)

## 2015-09-18 LAB — CBC
HCT: 50.1 % (ref 39.0–52.0)
HEMOGLOBIN: 13.9 g/dL (ref 13.0–17.0)
MCH: 25 pg — AB (ref 26.0–34.0)
MCHC: 27.7 g/dL — AB (ref 30.0–36.0)
MCV: 90.1 fL (ref 78.0–100.0)
Platelets: 16 10*3/uL — CL (ref 150–400)
RBC: 5.56 MIL/uL (ref 4.22–5.81)
RDW: 25.6 % — ABNORMAL HIGH (ref 11.5–15.5)
WBC: 9.5 10*3/uL (ref 4.0–10.5)

## 2015-09-18 LAB — GRAM STAIN

## 2015-09-18 LAB — GLUCOSE, SEROUS FLUID: GLUCOSE FL: 38 mg/dL

## 2015-09-18 LAB — LACTATE DEHYDROGENASE, PLEURAL OR PERITONEAL FLUID: LD FL: 328 U/L — AB (ref 3–23)

## 2015-09-18 MED ORDER — SODIUM CHLORIDE 0.9 % IV BOLUS (SEPSIS)
250.0000 mL | Freq: Once | INTRAVENOUS | Status: AC
  Administered 2015-09-18: 250 mL via INTRAVENOUS

## 2015-09-18 MED ORDER — SODIUM CHLORIDE 0.9 % IV SOLN
Freq: Once | INTRAVENOUS | Status: AC
  Administered 2015-09-18: 12:00:00 via INTRAVENOUS

## 2015-09-18 MED ORDER — DIPHENHYDRAMINE HCL 25 MG PO CAPS
25.0000 mg | ORAL_CAPSULE | Freq: Four times a day (QID) | ORAL | Status: DC | PRN
  Administered 2015-09-18 – 2015-09-21 (×3): 25 mg via ORAL
  Filled 2015-09-18 (×3): qty 1

## 2015-09-18 MED ORDER — ACETAMINOPHEN 325 MG PO TABS
650.0000 mg | ORAL_TABLET | Freq: Four times a day (QID) | ORAL | Status: DC | PRN
  Administered 2015-09-19 – 2015-09-21 (×2): 650 mg via ORAL
  Filled 2015-09-18 (×2): qty 2

## 2015-09-18 MED ORDER — ENSURE ENLIVE PO LIQD
237.0000 mL | Freq: Two times a day (BID) | ORAL | Status: DC
  Administered 2015-09-18 – 2015-09-21 (×7): 237 mL via ORAL

## 2015-09-18 NOTE — Progress Notes (Signed)
Patient ID: Kyle Heath, male   DOB: 07/01/1927, 81 y.o.   MRN: [id number]  [facility] PROGRESS NOTE  Micai Apolinar MOQ:[id number] DOB: 03-28-1927 DOA: 09/17/2015 PCP: Foye Spurling, MD   Brief narrative:    81 y.o. male with a past medical history that includes diabetes, hypertension, myeloproliferative disorder, atrial flutter, chronic diastolic dysfunction, discharged one day PTA after being hospitalized for CHF, now came back with main concern of persistent weakness, dyspnea at rest and with exertion, difficulty getting out of the bed. No reported fevers, chills, chest pain, no specific focal neurological symptoms.  In Ed, blood work notable for Plt 9, Cr down from 2.16 --> 1.65, imaging studies notable for bilateral pleural effusions. [facility] asked to admit for further evaluation.   Assessment/Plan:    Acute respiratory failure with hypoxia - Presented with oxygen saturation of 85% on room air. - secondary to bilateral pleural effusions - continue home regimen with lasix  - IR consulted for thoracentesis   Acute combined systolic and diastolic congestive heart failure - 2-D echo showed LVEF of 25-30% with diffuse hypokinesis new since 2014. - Low-dose Coreg initiated by cardiology on last admission  - resumed lasix   Thrombocytopenia - Patient has a known history of myeloproliferative disorder. Platelet count however, is significantly lower than what it was just a few weeks ago - transfused one unit of Plt on admission - transfuse additional unit of plt today  - CBC in AM  Chronic kidney disease stage III - Cr down since last admission - BMP in AM  History of hypothyroidism - Continue home medications.  History of atrial flutter - Rate is well controlled. Not on anticoagulation due to bleeding risk secondary to thrombocytopenia.  Severe PCM - nutritionist consulted   DVT prophylaxis - SCD  Code Status: Full.  Family Communication:  plan of care discussed  with family at bedside  Disposition Plan: Home when stable.   IV access:  Peripheral IV  Procedures and diagnostic studies:    Dg Chest 2 View 09/17/2015  Partial improvement in the interstitial edema and vascular congestion, with persistent effusions and bibasilar atelectasis/consolidation, right greater than left.   Ct Chest Wo Contrast 09/17/2015 Markedly enlarged heart with atherosclerotic disease of the coronary arteries and aorta. Calcifications of the aortic valve annulus. Moderate in size bilateral pleural effusions, and mild pulmonary edema.  Medical Consultants:  None  Other Consultants:  None  IAnti-Infectives:   None  Faye Ramsay, MD  [facility] Pager [phone]  If 7PM-7AM, please contact night-coverage [email or web address] Password [email or web address] 09/18/2015, 5:34 AM   LOS: 1 day   HPI/Subjective: No events overnight.   Objective: Filed Vitals:   09/17/15 2150 09/17/15 2335 09/18/15 0000 09/18/15 0443  BP: 125/74 121/82 141/71 117/80  Pulse: 97 98 98 94  Temp: 98.4 F (36.9 C) 97.8 F (36.6 C) 97.7 F (36.5 C) 97.4 F (36.3 C)  TempSrc: Oral Oral Oral Oral  Resp: 20 20 20 20   Height:      Weight:      SpO2: 100% 100% 100% 100%    Intake/Output Summary (Last 24 hours) at 09/18/15 0534 Last data filed at 09/18/15 0447  Gross per 24 hour  Intake    247 ml  Output    225 ml  Net     22 ml    Exam:   General:  Pt is not in acute distress  Cardiovascular: Regular rate and rhythm, no rubs, no gallops  Respiratory: Clear to auscultation bilaterally, no  wheezing, crackles at bases   Abdomen: Soft, non tender, non distended, bowel sounds present, no guarding  Data Reviewed: Basic Metabolic Panel:  Recent Labs Lab 09/12/15 0320 09/13/15 0450 09/14/15 0237 09/15/15 0323 09/17/15 1440  NA 141 140 142 140 144  K 4.4 4.4 4.3 4.5 4.2  CL 104 103 101 103 108  CO2 25 24 26 28 30   GLUCOSE 86 105* 105* 109* 90  BUN 46* 52* 57* 58* 54*  CREATININE 1.95*  2.20* 2.27* 2.16* 1.65*  CALCIUM 8.1* 8.2* 8.5* 8.3* 8.4*   Liver Function Tests: No results for input(s): AST, ALT, ALKPHOS, BILITOT, PROT, ALBUMIN in the last 168 hours. No results for input(s): LIPASE, AMYLASE in the last 168 hours. No results for input(s): AMMONIA in the last 168 hours. CBC:  Recent Labs Lab 09/14/15 0237 09/15/15 0323 09/16/15 1024 09/17/15 1440 09/17/15 1900  WBC 8.6 8.5 9.5 9.7 8.9  NEUTROABS  --   --   --  6.5  --   HGB 14.3 14.3 15.4 14.8 13.8  HCT 52.1* 52.0 54.1* 53.3* 49.2  MCV 89.7 89.2 88.3 89.9 88.8  PLT 13* 26* 12* 9* 7*   Cardiac Enzymes:  Recent Labs Lab 09/17/15 1440  TROPONINI <0.03   BNP: Invalid input(s): POCBNP CBG:  Recent Labs Lab 09/15/15 1119 09/15/15 1640 09/15/15 2109 09/16/15 0541 09/16/15 1106  GLUCAP 105* 95 134* 130* 86    No results found for this or any previous visit (from the past 240 hour(s)).   Scheduled Meds: . sodium chloride   Intravenous Once  . carvedilol  25 mg Oral BID WC  . diltiazem  360 mg Oral Daily  . furosemide  80 mg Oral Daily  . hydrALAZINE  10 mg Oral 3 times per day  . isosorbide mononitrate  15 mg Oral Daily  . levothyroxine  88 mcg Oral QAC breakfast  . sodium chloride  3 mL Intravenous Q12H   Continuous Infusions:

## 2015-09-18 NOTE — Care Management Note (Signed)
Case Management Note  Patient Details  Name: Kyle Heath MRN: [id number] Date of Birth: 10-31-1927  Subjective/Objective:                  dyspnea   Action/Plan: Discharge planning   Expected Discharge Date:      unknown           Expected Discharge Plan:  Skilled Nursing Facility  In-House Referral:  Clinical Social Work  Discharge planning Services  CM Consult  Post Acute Care Choice:    Choice offered to:     DME Arranged:    DME Agency:     HH Arranged:    [place] Agency:     Status of Service:  In process, will continue to follow  Medicare Important Message Given:    Date Medicare IM Given:    Medicare IM give by:    Date Additional Medicare IM Given:    Additional Medicare Important Message give by:     If discussed at [place] of Stay Meetings, dates discussed:    Additional Comments: CM spoke with patient's family. Patient was gone to a procedure. Family member would like to discuss long-term SNF placement. States she is unable to provide patient's care at home. Is also open to short term placement for rehab.  Apolonio Schneiders, RN 09/18/2015, 1:17 PM

## 2015-09-18 NOTE — Progress Notes (Signed)
Pt returned from radiology. Pt complains of felling  Cold. No bleeding at site noted. Pt assisted to bed. Family at bedside.

## 2015-09-18 NOTE — Procedures (Signed)
Successful US guided right thoracentesis. Yielded 830 mls of blood tinged fluid. Pt tolerated procedure well. No immediate complications.  Specimen was sent for labs. CXR ordered.  Kyrene Longan S Latacha Texeira PA-C 09/18/2015 11:10 AM

## 2015-09-19 LAB — PREPARE PLATELET PHERESIS
Unit division: 0
Unit division: 0

## 2015-09-19 LAB — BASIC METABOLIC PANEL
Anion gap: 6 (ref 5–15)
BUN: 48 mg/dL — AB (ref 6–20)
CALCIUM: 8.1 mg/dL — AB (ref 8.9–10.3)
CHLORIDE: 106 mmol/L (ref 101–111)
CO2: 30 mmol/L (ref 22–32)
CREATININE: 1.7 mg/dL — AB (ref 0.61–1.24)
GFR calc non Af Amer: 35 mL/min — ABNORMAL LOW (ref >60)
GFR, EST AFRICAN AMERICAN: 41 mL/min — AB (ref >60)
Glucose, Bld: 137 mg/dL — ABNORMAL HIGH (ref 65–99)
Potassium: 4 mmol/L (ref 3.5–5.1)
SODIUM: 142 mmol/L (ref 135–145)

## 2015-09-19 LAB — CBC
HCT: 43.1 % (ref 39.0–52.0)
Hemoglobin: 12 g/dL — ABNORMAL LOW (ref 13.0–17.0)
MCH: 24.8 pg — AB (ref 26.0–34.0)
MCHC: 27.8 g/dL — AB (ref 30.0–36.0)
MCV: 89.2 fL (ref 78.0–100.0)
Platelets: 37 10*3/uL — ABNORMAL LOW (ref 150–400)
RBC: 4.83 MIL/uL (ref 4.22–5.81)
RDW: 25.4 % — AB (ref 11.5–15.5)
WBC: 7.3 10*3/uL (ref 4.0–10.5)

## 2015-09-19 LAB — URINE CULTURE

## 2015-09-19 MED ORDER — SULFAMETHOXAZOLE-TRIMETHOPRIM 800-160 MG PO TABS
1.0000 | ORAL_TABLET | Freq: Two times a day (BID) | ORAL | Status: DC
  Administered 2015-09-19 – 2015-09-21 (×4): 1 via ORAL
  Filled 2015-09-19 (×4): qty 1

## 2015-09-19 NOTE — Telephone Encounter (Signed)
Patient currently admitted

## 2015-09-19 NOTE — Clinical Social Work Placement (Addendum)
   CLINICAL SOCIAL WORK PLACEMENT  NOTE  Date:  09/19/2015  Patient Details  Name: Kyle Heath MRN: [id number] Date of Birth: Mar 01, 1927  Clinical Social Work is seeking post-discharge placement for this patient at the [place] level of care (*CSW will initial, date and re-position this form in  chart as items are completed):  Yes   Patient/family provided with [facility] Work Department's list of facilities offering this level of care within the geographic area requested by the patient (or if unable, by the patient's family).  Yes   Patient/family informed of their freedom to choose among providers that offer the needed level of care, that participate in Medicare, Medicaid or managed care program needed by the patient, have an available bed and are willing to accept the patient.  Yes   Patient/family informed of 's ownership interest in [facility] and [facility], as well as of the fact that they are under no obligation to receive care at these facilities.  PASRR submitted to EDS on       PASRR number received on       Existing PASRR number confirmed on       FL2 transmitted to all facilities in geographic area requested by pt/family on 09/19/15     FL2 transmitted to all facilities within larger geographic area on 09/19/15     Patient informed that his/her managed care company has contracts with or will negotiate with certain facilities, including the following:         09/19/15  Patient/family informed of bed offers received.  Patient [facility] bed at  [facility]      Physician recommends and patient chooses bed at       Patient to be transferred to  [facility]  on   09/21/2015   Patient to be transferred to facility by  [facility]     Patient family notified on   09/21/2015  of transfer.  Name of family member notified:    [name]   PHYSICIAN Please sign FL2, Please prepare priority discharge summary, including  medications     Additional Comment:    _______________________________________________ Ludwig Clarks, LCSW 09/19/2015, 3:06 PM

## 2015-09-19 NOTE — Clinical Social Work Note (Signed)
Clinical Social Work Assessment  Patient Details  Name: Kyle Heath MRN: [id number] Date of Birth: [birth year]  Date of referral:  09/19/15               Reason for consult:  Facility Placement                Permission sought to share information with:  Family Supports Permission granted to share information::  No  Name::      (Daughter [name] at bedside)  Agency::     Relationship::     Contact Information:     Housing/Transportation Living arrangements for the past 2 months:  Single Family Home Source of Information:  Adult Children Patient Interpreter Needed:  None Criminal Activity/Legal Involvement Pertinent to Current Situation/Hospitalization:  No - Comment as needed Significant Relationships:  Adult Children, Warehouse manager, Friend Lives with:  Adult Children Do you feel safe going back to the place where you live?  No Need for family participation in patient care:  Yes (Comment)  Care giving concerns:  PT recommending SNF and daughter agreeable   Social Worker assessment / plan:  CSW met with patient's daughter at bedside to discuss possible SNF. Per daughter, [name], he has been living with her- she works 2nd shift but feels he will need SNF at dc. She has applied for Medicaid for him as of today and is hopeful he will be approved.  CSW discussed SNF with her- he has been to [facility] in the past and would prefer this again since he has been there and it is close to home.   Employment status:  Retired Nurse, adult PT Recommendations:  [place] / Referral to community resources:  [place]  Patient/Family's Response to care:  Daughter agreeable to [employer]- will seek SNF offers and insurance auth for [employer].   Patient/Family's Understanding of and Emotional Response to Diagnosis, Current Treatment, and Prognosis:  Daughter acknowledges that caring for him at home has become a struggle- she is  thinking he may need long term placement. She shared with CSW that he spends most of his day "Sitting in his car" watching the neighborhood traffic and Architect-    Emotional Assessment Appearance:    Attitude/Demeanor/Rapport:  Unable to Assess Affect (typically observed):  Unable to Assess Orientation:  Oriented to Self Alcohol / Substance use:  Not Applicable Psych involvement (Current and /or in the community):  No (Comment)  Discharge Needs  Concerns to be addressed:  No discharge needs identified Readmission within the last 30 days:  No Current discharge risk:  None Barriers to Discharge:  No Barriers Identified   Ludwig Clarks, LCSW 09/19/2015, 2:59 PM

## 2015-09-19 NOTE — Evaluation (Signed)
Physical Therapy Evaluation Patient Details Name: Kyle Heath MRN: [id number] DOB: 12-13-1926 Today's Date: 09/19/2015   History of Present Illness  81 y.o. male with a past medical history that includes dementia, diabetes, hypertension, myeloproliferative disorder, atrial flutter, chronic diastolic dysfunction, discharged one day PTA after being hospitalized for CHF, now came back with main concern of persistent weakness, dyspnea at rest and with exertion, difficulty getting out of the bed  Clinical Impression  On eval, pt  Required Min assist for mobility-walked ~15'x2 to and from bathroom. Pt is unsteady and appears generally weak. Repeatedly replaced [place] O2 during session due to pt removing. Recommend ST rehab at SNF.    Follow Up Recommendations SNF;Supervision/Assistance - 24 hour    Equipment Recommendations  None recommended by PT    Recommendations for Other Services       Precautions / Restrictions Precautions Precautions: Fall Precaution Comments: monitor BP Restrictions Weight Bearing Restrictions: No      Mobility  Bed Mobility Overal bed mobility: Needs Assistance Bed Mobility: Supine to Sit     Supine to sit: Min guard;HOB elevated     General bed mobility comments: oob in recliner  Transfers Overall transfer level: Needs assistance Equipment used: Rolling walker (2 wheeled) Transfers: Sit to/from Stand Sit to Stand: Min assist Stand pivot transfers: Min assist       General transfer comment: min assist to rise and steady. cues for hand placement and overall safety.  Ambulation/Gait Ambulation/Gait assistance: Min assist Ambulation Distance (Feet): 15 Feet (x2) Assistive device: 1 person hand held assist Gait Pattern/deviations: Step-through pattern;Decreased stride length     General Gait Details: Assist to stabilize. VCs safety  Stairs            Wheelchair Mobility    Modified Rankin (Stroke Patients Only)       Balance  Overall balance assessment: Needs assistance   Sitting balance-Leahy Scale: Good     Standing balance support: Single extremity supported;During functional activity Standing balance-Leahy Scale: Fair                               Pertinent Vitals/Pain Pain Assessment: No/denies pain    Home Living Family/patient expects to be discharged to:: Unsure Living Arrangements: Children Available Help at Discharge: Family Type of Home: House Home Access: Stairs to enter Entrance Stairs-Rails: None Entrance Stairs-Number of Steps: 5 Home Layout: One level Home Equipment: Bedside commode;Shower seat;Walker - standard Additional Comments: pt dtr lives with him but works night shift    Prior Function Level of Independence: Needs assistance   Gait / Transfers Assistance Needed: I household ambulator  ADL's / Homemaking Assistance Needed: dtr assists with meals, household tasks. Pt is independent with bathing, dressing and toileting  Comments: No family present. Information taken from recent admission this month.     Hand Dominance   Dominant Hand: Right    Extremity/Trunk Assessment   Upper Extremity Assessment: Defer to OT evaluation           Lower Extremity Assessment: Generalized weakness      Cervical / Trunk Assessment: Normal  Communication   Communication: HOH  Cognition Arousal/Alertness: Awake/alert Behavior During Therapy: WFL for tasks assessed/performed Overall Cognitive Status: History of cognitive impairments - at baseline                      General Comments      Exercises  Assessment/Plan    PT Assessment Patient needs continued PT services  PT Diagnosis Difficulty walking;Generalized weakness;Altered mental status   PT Problem List Decreased strength;Decreased mobility;Decreased cognition;Decreased balance;Decreased activity tolerance  PT Treatment Interventions Gait training;Functional mobility  training;Therapeutic activities;Patient/family education;Balance training;Therapeutic exercise   PT Goals (Current goals can be found in the Care Plan section) Acute Rehab PT Goals Patient Stated Goal: agreeable to participate PT Goal Formulation: Patient unable to participate in goal setting Time For Goal Achievement: 10/03/15 Potential to Achieve Goals: Good    Frequency Min 3X/week   Barriers to discharge        Co-evaluation               End of Session Equipment Utilized During Treatment: Gait belt;Oxygen (pt keeps removing it) Activity Tolerance: Patient limited by fatigue Patient left: in chair;with call bell/phone within reach;with chair alarm set           Time: 0037-0488 PT Time Calculation (min) (ACUTE ONLY): 17 min   Charges:   PT Evaluation $Initial PT Evaluation Tier I: 1 Procedure     PT G Codes:        Weston Anna, MPT Pager: [phone]

## 2015-09-19 NOTE — NC FL2 (Signed)
[place] LEVEL OF CARE SCREENING TOOL     IDENTIFICATION  Patient Name: Kyle Heath Birthdate: 02-14-1927 Sex: male Admission Date (Current Location): 09/17/2015  [facility] and [place] Number: Herbalist and Address:  [facility],  [place] [address] [place]      Provider Number: [id number]  Attending Physician Name and Address:  Theodis Blaze, MD  Relative Name and Phone Number:       Current Level of Care: Hospital Recommended Level of Care: [place] Prior Approval Number:    Date Approved/Denied:   PASRR Number:   [id number]  Discharge Plan: SNF    Current Diagnoses: Patient Active Problem List   Diagnosis Date Noted  . Bilateral pleural effusion   . Renal insufficiency   . Malnutrition of moderate degree 09/14/2015  . CAP (community acquired pneumonia)   . Thrombocytopenia ([place])   . ARF (acute renal failure) ([place])   . Hypoxia 09/07/2015  . UTI (lower urinary tract infection) 09/07/2015  . Acute kidney injury ([place]) 09/07/2015  . Acute on chronic combined systolic (congestive) and diastolic (congestive) heart failure ([place]) 09/07/2015  . AKI (acute kidney injury) ([place])   . Acute diastolic CHF (congestive heart failure) ([place])   . Pleural effusion   . Peripheral edema 07/29/2015  . Physical deconditioning 07/29/2015  . Jaw pain 10/18/2014  . Acute on chronic diastolic heart failure ([place]) 01/04/2013  . Acute respiratory failure with hypoxia ([place]) 01/04/2013  . Essential hypertension, benign 01/04/2013  . Gout 01/04/2013  . CVA (cerebral infarction) 01/04/2013  . Pain 07/07/2012  . Fatigue 07/07/2012  . Weakness generalized 07/07/2012  . Myeloproliferative disorder ([place]) 06/30/2012  . Shortness of breath 06/30/2012  . Atrial flutter ([place]) 06/30/2012  . Volume overload 06/30/2012  . Subacute delirium 06/30/2012  . Hypothyroidism 06/30/2012  . Anemia 02/15/2012    Orientation ACTIVITIES/SOCIAL  BLADDER RESPIRATION    Self  Active Continent O2 (As needed) (2l)  BEHAVIORAL SYMPTOMS/MOOD NEUROLOGICAL BOWEL NUTRITION STATUS      Continent    PHYSICIAN VISITS COMMUNICATION OF NEEDS Height & Weight Skin    Verbally 5\' 8"  (172.7 cm) 157 lbs. Normal          AMBULATORY STATUS RESPIRATION    Assist extensive O2 (As needed) (2l)      Personal Care Assistance Level of Assistance  Bathing, Dressing Bathing Assistance: Limited assistance   Dressing Assistance: Limited assistance      Functional Limitations [place]  PT (By licensed PT), OT (By licensed OT)     PT Frequency: 5 OT Frequency: 5           Additional Factors Info  Code Status, Allergies Code Status Info: Full Code Allergies Info: NKDA           Current Medications (09/19/2015): Current Facility-Administered Medications  Medication Dose Route Frequency Provider Last Rate Last Dose  . 0.9 %  sodium chloride infusion   Intravenous Once Theodis Blaze, MD      . acetaminophen (TYLENOL) tablet 650 mg  650 mg Oral Q6H PRN Dianne Dun, NP   650 mg at 09/19/15 0002  . carvedilol (COREG) tablet 25 mg  25 mg Oral BID WC Theodis Blaze, MD   25 mg at 09/19/15 0748  . diphenhydrAMINE (BENADRYL) capsule 25 mg  25 mg Oral Q6H PRN Rebecca Eaton M  Doyle Askew, MD   25 mg at 09/18/15 1503  . feeding supplement (ENSURE ENLIVE) (ENSURE ENLIVE) liquid 237 mL  237 mL Oral BID BM Theodis Blaze, MD   237 mL at 09/19/15 1333  . levalbuterol (XOPENEX) nebulizer solution 0.63 mg  0.63 mg Nebulization Q6H PRN Theodis Blaze, MD      . levothyroxine (SYNTHROID, LEVOTHROID) tablet 88 mcg  88 mcg Oral QAC breakfast Theodis Blaze, MD   88 mcg at 09/19/15 0747  . ondansetron (ZOFRAN) tablet 4 mg  4 mg Oral Q6H PRN Theodis Blaze, MD       Or  . ondansetron [facility]) injection 4 mg  4 mg Intravenous Q6H PRN Theodis Blaze, MD      . sodium chloride 0.9 % injection 3 mL  3 mL Intravenous Q12H  Theodis Blaze, MD   3 mL at 09/19/15 0935   Facility-Administered Medications Ordered in Other Encounters  Medication Dose Route Frequency Provider Last Rate Last Dose  . furosemide (LASIX) injection 20 mg  20 mg Intravenous Once [name], NP   Stopped at 02/13/13 1623   Do not use this list as official medication orders. Please verify with discharge summary.  Discharge Medications:   Medication List    ASK your doctor about these medications        acetaminophen 500 MG tablet  Commonly known as:  TYLENOL  Take 500 mg by mouth every 6 (six) hours as needed for mild pain.     aspirin 81 MG tablet  Take 81 mg by mouth daily after breakfast.     carvedilol 25 MG tablet  Commonly known as:  COREG  Take 25 mg by mouth 2 (two) times daily with a meal.     furosemide 80 MG tablet  Commonly known as:  LASIX  Take 1 tablet (80 mg total) by mouth daily.     hydrALAZINE 10 MG tablet  Commonly known as:  APRESOLINE  Take 1 tablet (10 mg total) by mouth every 8 (eight) hours.     isosorbide mononitrate 30 MG 24 hr tablet  Commonly known as:  IMDUR  Take 0.5 tablets (15 mg total) by mouth daily.     levothyroxine 88 MCG tablet  Commonly known as:  SYNTHROID, LEVOTHROID  Take 88 mcg by mouth daily before breakfast.     multivitamin with minerals Tabs tablet  Take 1 tablet by mouth daily.     potassium chloride SA 20 MEQ tablet  Commonly known as:  K-DUR,KLOR-CON  Take 1 tablet (20 mEq total) by mouth daily.     TAZTIA XT 360 MG 24 hr capsule  Generic drug:  diltiazem  Take 360 mg by mouth daily.     ULORIC 80 MG Tabs  Generic drug:  Febuxostat  Take 1 tablet by mouth daily.        Relevant Imaging Results:  Relevant Lab Results:  Recent Labs    Additional Information  SSN [id number]  Ludwig Clarks, LCSW

## 2015-09-19 NOTE — Progress Notes (Signed)
Patient ID: Kyle Heath, male   DOB: 1926/12/14, 81 y.o.   MRN: [id number]  [facility] PROGRESS NOTE  Kyle Heath:[id number] DOB: Aug 15, 1927 DOA: 09/17/2015 PCP: Foye Spurling, MD   Brief narrative:    81 y.o. male with a past medical history that includes diabetes, hypertension, myeloproliferative disorder, atrial flutter, chronic diastolic dysfunction, discharged one day PTA after being hospitalized for CHF, now came back with main concern of persistent weakness, dyspnea at rest and with exertion, difficulty getting out of the bed. No reported fevers, chills, chest pain, no specific focal neurological symptoms.  In Ed, blood work notable for Plt 9, Cr down from 2.16 --> 1.65, imaging studies notable for bilateral pleural effusions. [facility] asked to admit for further evaluation.   Assessment/Plan:    Acute respiratory failure with hypoxia - Presented with oxygen saturation of 85% on room air. - secondary to bilateral pleural effusions, positive for g+ cocci in clusters - continue home regimen with lasix  - place on Bactrim   Acute combined systolic and diastolic congestive heart failure - 2-D echo showed LVEF of 25-30% with diffuse hypokinesis new since 2014. - Low-dose Coreg initiated by cardiology on last admission, may need to hold if SBP < 90 - resumed lasix   Thrombocytopenia - Patient has a known history of myeloproliferative disorder. Platelet count however, is significantly lower than what it was just a few weeks ago - transfused one unit of Plt on admission - transfuse additional unit of plt 10/30, Plt in 30's this AM - CBC in AM  Chronic kidney disease stage III - Cr down since last admission - BMP in AM  History of hypothyroidism - Continue home medications.  History of atrial flutter - Rate is well controlled. Not on anticoagulation due to bleeding risk secondary to thrombocytopenia.  Severe PCM - nutritionist consulted   DVT prophylaxis -  SCD  Code Status: Full.  Family Communication:  plan of care discussed with family at bedside  Disposition Plan: Home when stable.   IV access:  Peripheral IV  Procedures and diagnostic studies:    Dg Chest 2 View 09/17/2015  Partial improvement in the interstitial edema and vascular congestion, with persistent effusions and bibasilar atelectasis/consolidation, right greater than left.   Ct Chest Wo Contrast 09/17/2015 Markedly enlarged heart with atherosclerotic disease of the coronary arteries and aorta. Calcifications of the aortic valve annulus. Moderate in size bilateral pleural effusions, and mild pulmonary edema.  Medical Consultants:  None  Other Consultants:  None  IAnti-Infectives:   None  Faye Ramsay, MD  [facility] Pager [phone]  If 7PM-7AM, please contact night-coverage [email or web address] Password [email or web address] 09/19/2015, 12:28 PM   LOS: 2 days   HPI/Subjective: No events overnight.   Objective: Filed Vitals:   09/19/15 1010 09/19/15 1020 09/19/15 1030 09/19/15 1041  BP: 88/65 110/53 103/49 128/74  Pulse: 61 63 62   Temp:      TempSrc:      Resp:      Height:      Weight:      SpO2:        Intake/Output Summary (Last 24 hours) at 09/19/15 1228 Last data filed at 09/19/15 0759  Gross per 24 hour  Intake    672 ml  Output    851 ml  Net   -179 ml    Exam:   General:  Pt is not in acute distress  Cardiovascular: Regular rate and rhythm, no rubs, no gallops  Respiratory: Clear to auscultation  bilaterally, no wheezing, crackles at bases   Abdomen: Soft, non tender, non distended, bowel sounds present, no guarding  Data Reviewed: Basic Metabolic Panel:  Recent Labs Lab 09/14/15 0237 09/15/15 0323 09/17/15 1440 09/18/15 0543 09/19/15 0513  NA 142 140 144 144 142  K 4.3 4.5 4.2 4.1 4.0  CL 101 103 108 107 106  CO2 26 28 30 28 30   GLUCOSE 105* 109* 90 79 137*  BUN 57* 58* 54* 48* 48*  CREATININE 2.27* 2.16* 1.65* 1.51* 1.70*  CALCIUM  8.5* 8.3* 8.4* 8.2* 8.1*   CBC:  Recent Labs Lab 09/16/15 1024 09/17/15 1440 09/17/15 1900 09/18/15 0543 09/19/15 0513  WBC 9.5 9.7 8.9 9.5 7.3  NEUTROABS  --  6.5  --   --   --   HGB 15.4 14.8 13.8 13.9 12.0*  HCT 54.1* 53.3* 49.2 50.1 43.1  MCV 88.3 89.9 88.8 90.1 89.2  PLT 12* 9* 7* 16* 37*   Cardiac Enzymes:  Recent Labs Lab 09/17/15 1440  TROPONINI <0.03   CBG:  Recent Labs Lab 09/15/15 1119 09/15/15 1640 09/15/15 2109 09/16/15 0541 09/16/15 1106  GLUCAP 105* 95 134* 130* 86    Recent Results (from the past 240 hour(s))  Urine culture     Status: None   Collection Time: 09/17/15  3:00 PM  Result Value Ref Range Status   Specimen Description URINE, RANDOM  Final   Special Requests NONE  Final   Culture   Final    MULTIPLE SPECIES PRESENT, SUGGEST RECOLLECTION Performed at [facility]    Report Status 09/19/2015 FINAL  Final  Gram stain     Status: None   Collection Time: 09/18/15 11:34 AM  Result Value Ref Range Status   Specimen Description FLUID RIGHT PLEURAL  Final   Special Requests NONE  Final   Gram Stain   Final    WBC PRESENT,BOTH PMN AND MONONUCLEAR NO ORGANISMS SEEN CONFIRMEDC BY K.MATILAINEN Performed at [facility]    Report Status 09/18/2015 FINAL  Final     Scheduled Meds: . sodium chloride   Intravenous Once  . carvedilol  25 mg Oral BID WC  . feeding supplement (ENSURE ENLIVE)  237 mL Oral BID BM  . levothyroxine  88 mcg Oral QAC breakfast  . sodium chloride  3 mL Intravenous Q12H   Continuous Infusions:

## 2015-09-19 NOTE — Evaluation (Signed)
Occupational Therapy Evaluation Patient Details Name: Kyle Heath MRN: [id number] DOB: Sep 17, 1927 Today's Date: 09/19/2015    History of Present Illness 81 y.o. male with a past medical history that includes dementia, diabetes, hypertension, myeloproliferative disorder, atrial flutter, chronic diastolic dysfunction, discharged one day PTA after being hospitalized for CHF, now came back with main concern of persistent weakness, dyspnea at rest and with exertion, difficulty getting out of the bed   Clinical Impression   Pt's BP sitting up in the bed 88/65. MD in room shortly after BP taken and stated ok for pt to try to get up to the chair. BP sitting at EOB 110/53 HR 63. Pt did complain of feeling lightheaded but stated he would like to sit in the chair. Pivoted to chair with min assist and walker. BP once in chair 103/49 HR 62. Informed nursing of all BP readings. Pt started to go to sleep once in chair. No family present for eval- if pt has 24/7 then he could return home with [facility] but if not, recommend SNF.    Follow Up Recommendations  SNF;Other (comment) (pt would need 24/7 for safety. ) Per chart from recent admission, pt lives with daughter who works. If pt doesn't have 24/7, then recommend SNF .   Equipment Recommendations  None recommended by OT    Recommendations for Other Services       Precautions / Restrictions Precautions Precautions: Fall Precaution Comments: monitor BP Restrictions Weight Bearing Restrictions: No      Mobility Bed Mobility Overal bed mobility: Needs Assistance Bed Mobility: Supine to Sit     Supine to sit: Min guard;HOB elevated     General bed mobility comments: min guard for safety.  Transfers Overall transfer level: Needs assistance Equipment used: Rolling walker (2 wheeled) Transfers: Sit to/from Stand Sit to Stand: Min assist Stand pivot transfers: Min assist       General transfer comment: min assist to rise and steady. cues  for hand placement and overall safety.    Balance     Sitting balance-Leahy Scale: Good       Standing balance-Leahy Scale: Fair                              ADL Overall ADL's : Needs assistance/impaired Eating/Feeding: Sitting;Set up   Grooming: Wash/dry face;Sitting   Upper Body Bathing: Supervision/ safety;Set up;Sitting   Lower Body Bathing: Minimal assistance;Sit to/from stand   Upper Body Dressing : Set up;Sitting   Lower Body Dressing: Minimal assistance;Sit to/from stand   Toilet Transfer: Minimal assistance;Stand-pivot;RW   Toileting- Clothing Manipulation and Hygiene: Minimal assistance;Sit to/from stand         General ADL Comments: Pt sitting up/forward in the bed and verbalizing, "what can you do for me?" This OT asked what pt needed and he stated, "anything." Pt able to don socks from sitting up in the bed without needing assist. BP in bed 88/65. MD came into room and stated ok to see how pt did to get up to the recliner this am. BP sitting EOB 110/53 and once in recliner 103/49. Pt did state he felt "not good" and stated "yes" when asked if he felt lightheaded. Nursing made aware of BP readings. No family present for session to discuss d/c plan further.      Vision     Perception     Praxis      Pertinent Vitals/Pain Pain Assessment: No/denies pain  Hand Dominance Right   Extremity/Trunk Assessment Upper Extremity Assessment Upper Extremity Assessment: Generalized weakness           Communication Communication Communication: HOH   Cognition Arousal/Alertness: Awake/alert Behavior During Therapy: WFL for tasks assessed/performed Overall Cognitive Status: History of cognitive impairments - at baseline                     General Comments       Exercises       Shoulder Instructions      Home Living Family/patient expects to be discharged to:: Unsure Living Arrangements: Children Available Help at Discharge:  Family Type of Home: House Home Access: Stairs to enter Technical brewer of Steps: 5 Entrance Stairs-Rails: None Home Layout: One level     Bathroom Shower/Tub: Teacher, early years/pre: Standard     Home Equipment: Bedside commode;Shower seat;Walker - standard   Additional Comments: pt dtr lives with him but works night shift      Prior Functioning/Environment Level of Independence: Needs assistance  Gait / Transfers Assistance Needed: I household ambulator ADL's / Homemaking Assistance Needed: dtr assists with meals, household tasks. Pt is independent with bathing, dressing and toileting   Comments: No family present. Information taken from recent admission this month.    OT Diagnosis: Generalized weakness   OT Problem List: Decreased strength;Decreased knowledge of use of DME or AE;Decreased activity tolerance   OT Treatment/Interventions: Self-care/ADL training;Patient/family education;Therapeutic activities;DME and/or AE instruction    OT Goals(Current goals can be found in the care plan section) Acute Rehab OT Goals Patient Stated Goal: pt agreeable up to chair. OT Goal Formulation: With patient Time For Goal Achievement: 10/03/15 Potential to Achieve Goals: Good  OT Frequency: Min 2X/week   Barriers to D/C:            Co-evaluation              End of Session Equipment Utilized During Treatment: Rolling walker  Activity Tolerance: Other (comment) (reports feeling lightheaded.) Patient left: in chair;with call bell/phone within reach;with chair alarm set   Time: 9030-0923 OT Time Calculation (min): 27 min Charges:  OT General Charges $OT Visit: 1 Procedure OT Evaluation $Initial OT Evaluation Tier I: 1 Procedure OT Treatments $Therapeutic Activity: 8-22 mins G-Codes:    Jules Schick  [phone] 09/19/2015, 11:24 AM

## 2015-09-19 NOTE — Progress Notes (Signed)
Resting in bed voices no c/o

## 2015-09-19 NOTE — Progress Notes (Signed)
   09/18/15 2046  Vitals  BP (!) 82/39 mmHg  BP Location Left Arm  BP Method Automatic  Patient Position (if appropriate) Lying  Pulse Rate (!) 50   NP on call for [facility] informed of blood pressure. Order for 28ml NS written. Will continue to monitor.

## 2015-09-19 NOTE — Progress Notes (Signed)
   09/19/15 1500  Clinical Encounter Type  Visited With Patient  Visit Type Initial  Referral From Palliative care team  Spiritual Encounters  Spiritual Needs Emotional  Ch visited with pt; [place] spoke with pt about his situation; pt focused on his hunger; [place] mentioned pt hunger to RN to follow-up; Further consultation with Palliative Care as needed. 3:30 PM Gwynn Burly

## 2015-09-19 NOTE — Progress Notes (Signed)
Pharmacist Heart Failure Core Measure Documentation  Assessment: Kyle Heath has an EF documented as 25-30% on 09/08/2015 by ECHO.  Rationale: Heart failure patients with left ventricular systolic dysfunction (LVSD) and an EF < 40% should be prescribed an angiotensin converting enzyme inhibitor (ACEI) or angiotensin receptor blocker (ARB) at discharge unless a contraindication is documented in the medical record.  This patient is not currently on an ACEI or ARB for HF.  This note is being placed in the record in order to provide documentation that a contraindication to the use of these agents is present for this encounter.  ACE Inhibitor or Angiotensin Receptor Blocker is contraindicated (specify all that apply)  []   ACEI allergy AND ARB allergy []   Angioedema []   Moderate or severe aortic stenosis []   Hyperkalemia [x]   Hypotension []   Renal artery stenosis [x]   Worsening renal function, preexisting renal disease or dysfunction   Clovis Riley 09/19/2015 3:05 PM

## 2015-09-20 ENCOUNTER — Inpatient Hospital Stay (HOSPITAL_COMMUNITY)
Admit: 2015-09-20 | Discharge: 2015-09-20 | Disposition: A | Discharge status: Home / Self Care | Payer: Medicare Other | Attending: Oncology | Admitting: Oncology

## 2015-09-20 DIAGNOSIS — J948 Other specified pleural conditions: Secondary | ICD-10-CM

## 2015-09-20 DIAGNOSIS — J90 Pleural effusion, not elsewhere classified: Secondary | ICD-10-CM | POA: Insufficient documentation

## 2015-09-20 DIAGNOSIS — R0600 Dyspnea, unspecified: Secondary | ICD-10-CM

## 2015-09-20 DIAGNOSIS — Z7189 Other specified counseling: Secondary | ICD-10-CM

## 2015-09-20 DIAGNOSIS — Z515 Encounter for palliative care: Secondary | ICD-10-CM

## 2015-09-20 LAB — BASIC METABOLIC PANEL
ANION GAP: 9 (ref 5–15)
BUN: 42 mg/dL — ABNORMAL HIGH (ref 6–20)
CALCIUM: 8.2 mg/dL — AB (ref 8.9–10.3)
CHLORIDE: 105 mmol/L (ref 101–111)
CO2: 29 mmol/L (ref 22–32)
CREATININE: 1.53 mg/dL — AB (ref 0.61–1.24)
GFR calc non Af Amer: 40 mL/min — ABNORMAL LOW (ref >60)
GFR, EST AFRICAN AMERICAN: 46 mL/min — AB (ref >60)
Glucose, Bld: 91 mg/dL (ref 65–99)
Potassium: 4.3 mmol/L (ref 3.5–5.1)
SODIUM: 143 mmol/L (ref 135–145)

## 2015-09-20 LAB — CBC
HCT: 47.8 % (ref 39.0–52.0)
HEMOGLOBIN: 13 g/dL (ref 13.0–17.0)
MCH: 24.5 pg — ABNORMAL LOW (ref 26.0–34.0)
MCHC: 27.2 g/dL — AB (ref 30.0–36.0)
MCV: 90.2 fL (ref 78.0–100.0)
Platelets: 27 10*3/uL — CL (ref 150–400)
RBC: 5.3 MIL/uL (ref 4.22–5.81)
RDW: 25.6 % — AB (ref 11.5–15.5)
WBC: 8 10*3/uL (ref 4.0–10.5)

## 2015-09-20 MED ORDER — SODIUM CHLORIDE 0.9 % IV SOLN
Freq: Once | INTRAVENOUS | Status: AC
  Administered 2015-09-20: 09:00:00 via INTRAVENOUS

## 2015-09-20 MED ORDER — FUROSEMIDE 40 MG PO TABS
40.0000 mg | ORAL_TABLET | Freq: Every day | ORAL | Status: DC
Start: 2015-09-20 — End: 2015-09-21
  Administered 2015-09-20 – 2015-09-21 (×2): 40 mg via ORAL
  Filled 2015-09-20 (×2): qty 1

## 2015-09-20 NOTE — Progress Notes (Signed)
PT Cancellation Note  Patient Details Name: Kyle Heath MRN: [id number] DOB: 11/15/1927   Cancelled Treatment:    Reason Eval/Treat Not Completed: Patient declined, no reason specified Continued to decline mobility despite encouragement   Brexton Sofia,KATHrine E 09/20/2015, 11:24 AM Carmelia Bake, PT, DPT 09/20/2015 Pager: [phone]

## 2015-09-20 NOTE — Progress Notes (Signed)
alerted Dr Doyle Askew R/T 6 beat run VT pt asymptomatic oob at time getting into chair

## 2015-09-20 NOTE — Progress Notes (Signed)
CSW provided patient's daughter with SNF bed offers- she has selected [facility] and we are now seeking SNF auth. Per RN, getting transfusion today.  Eduard Clos, MSW, [place]

## 2015-09-20 NOTE — Progress Notes (Signed)
CRITICAL VALUE ALERT  Critical value received:  Platelets 27  Date of notification:  09/20/2015  Time of notification:  0515  Critical value read back: yes  Nurse who received alert:  Izabell Schalk   MD notified (1st page):  Baltazar Najjar  Time of first page:  0519  MD notified (2nd page):  Time of second page:  Responding MD: Baltazar Najjar  Time MD responded:  [phone]

## 2015-09-20 NOTE — Progress Notes (Signed)
Spoke to [place] DTR and she is ok w/ Plts to be given

## 2015-09-20 NOTE — Progress Notes (Signed)
   09/20/15 1500  Clinical Encounter Type  Visited With Patient and family together  Visit Type Spiritual support;Other (Comment) (Advance Directive)  Referral From Palliative care team  Spiritual Encounters  Spiritual Needs Other (Comment) (advance directive)  Stress Factors  Patient Stress Factors Health changes;Lack of knowledge  Family Stress Factors Major life changes    Chaplain referred by palliative care at family's request.  Assisted pt in completing HCPOA.  Notarized.  Original and one copy to family.  Copy in chart and with medical records to be scanned into EPIC.    Provided brief support with pt's daughters around health changes and discharge plans.      [place], [place]

## 2015-09-20 NOTE — Consult Note (Signed)
Consultation Note Date: 09/20/2015   Patient Name: Kyle Heath  DOB: December 13, 1926  MRN: [id number]  Age / Sex: 81 y.o., male  PCP: Foye Spurling, MD Referring Physician: Theodis Blaze, MD  Reason for Consultation: Establishing goals of care, Non pain symptom management, Pain control and Psychosocial/spiritual support    Clinical Assessment/Narrative:  Consult is for review of medical treatment options, clarification of goals of care and end of life issues, disposition and options, and symptom recommendation.  This NP Kyle Heath reviewed medical records, received report from team, assessed the patient and then meet at the patient's bedside along with his two daughter Kyle Heath and Kyle Heath  to discuss diagnosis, prognosis, GOC, EOL wishes disposition and options.   A detailed discussion was had today regarding advanced directives.  Concepts specific to code status, artifical feeding and hydration, continued IV antibiotics and rehospitalization was had.  The difference between a aggressive medical intervention path  and a palliative comfort care path for this patient at this time was had.  Values and goals of care important to patient and family were attempted to be elicited.  Concept of Hospice and Palliative Care were discussed  Natural trajectory and expectations at EOL were discussed.  Questions and concerns addressed.  Hard Choices booklet left for review. Family encouraged to call with questions or concerns.  PMT will continue to support holistically.   Contacts/Participants in Discussion:   Patient and two daughters and friend Tax adviser: Patient with support of his family   HCPOA: no --Spiritual care contacted, wish is to secure HPOA today   SUMMARY OF RECOMMENDATIONS:  81 year old male with diastolic heart failure, diabetes mellitus, hypertension, history of paroxysmal A. fib flutter (not  on anticoagulation due to underlying myeloproliferative disorder) COPD and a myeloproliferative disorder, mild memory loss,  who presents to the hospital for shortness of breath slowly progressive over the past 2 weeks according to the daughter. He is found to be hypoxic in the ER and is requiring 2 L of oxygen to keep his saturation at 95%.   Per family slow continued physical, functional and cognitive decline over the past year.  Continue to face care needs, and advanced directive decsions.  Code Status/Advance Care Planning:  Full code       Code Status Orders        Start     Ordered   09/17/15 1827  Full code   Continuous     09/17/15 1826      Other Directives:Advanced Directive and Living Will-secured today by spiritual care  Symptom Management:   Weakness: PT/OT as tolerated, SNF for rehabilitation on dischrge  Palliative Prophylaxis:   Bowel Regimen, Frequent Pain Assessment, Palliative Wound Care and Turn Reposition  Additional Recommendations (Limitations, Scope, Preferences):  Full Scope Treatment-per patient and  family request  Psycho-social/Spiritual:  Support System: Kyle Heath Desire for further Chaplaincy support:yes   Prognosis: Unable to determine  Discharge Planning: [facility] [place] for rehab with Palliative care service follow-up   Chief Complaint/ Primary Diagnoses: Present on Admission:  . Hypoxia . Thrombocytopenia ([place])  I have reviewed the medical record, interviewed the patient and family, and examined the patient. The following aspects are pertinent.  Past Medical History  Diagnosis Date  . Anemia   . Hypothyroidism   . Hypertension   . Myeloproliferative disorder ([place]) 06/30/2012  . Paroxysmal atrial flutter ([place]) 2014  . Chronic combined systolic and diastolic CHF (congestive heart failure) ([place])     a. EF  in 2014 55-60% b. EF in 08/2015 25-30%  . Gout   . BPH (benign prostatic hypertrophy)   . COPD (chronic obstructive  pulmonary disease) ([place])   . Bone marrow disorder   . CVA (cerebral vascular accident) ([place])   . Type II diabetes mellitus ([place])   . History of blood transfusion "several"  . Dementia   . Arthritis     "mostly in joints" (09/07/2015)  . Use of cane as ambulatory aid    Social History   Social History  . Marital Status: Widowed    Spouse Name: N/A  . Number of Children: N/A  . Years of Education: N/A   Social History Main Topics  . Smoking status: Former Smoker    Types: Cigarettes, Cigars  . Smokeless tobacco: Current User    Types: Chew     Comment: 09/07/2015 "don't know of any cigarettes/cigar smoking"  . Alcohol Use: No  . Drug Use: No  . Sexual Activity: No   Other Topics Concern  . None   Social History Narrative   Lives in a house with his daughter.  Does not use a cane or walker.   He drives (but is not supposed to).              Family History  Problem Relation Age of Onset  . Heart disease Neg Hx   . Heart failure Neg Hx   . Prostate cancer     Scheduled Meds: . sodium chloride   Intravenous Once  . carvedilol  25 mg Oral BID WC  . feeding supplement (ENSURE ENLIVE)  237 mL Oral BID BM  . furosemide  40 mg Oral Daily  . levothyroxine  88 mcg Oral QAC breakfast  . sodium chloride  3 mL Intravenous Q12H  . sulfamethoxazole-trimethoprim  1 tablet Oral Q12H   Continuous Infusions:  PRN Meds:.acetaminophen, diphenhydrAMINE, levalbuterol, ondansetron **OR** ondansetron (ZOFRAN) IV Medications Prior to Admission:  Prior to Admission medications   Medication Sig Start Date End Date Taking? Authorizing Provider  acetaminophen (TYLENOL) 500 MG tablet Take 500 mg by mouth every 6 (six) hours as needed for mild pain.   Yes Historical Provider, MD  aspirin 81 MG tablet Take 81 mg by mouth daily after breakfast.    Yes Historical Provider, MD  carvedilol (COREG) 25 MG tablet Take 25 mg by mouth 2 (two) times daily with a meal.  08/07/14  Yes Historical Provider,  MD  furosemide (LASIX) 80 MG tablet Take 1 tablet (80 mg total) by mouth daily. 09/16/15  Yes Verlee Monte, MD  hydrALAZINE (APRESOLINE) 10 MG tablet Take 1 tablet (10 mg total) by mouth every 8 (eight) hours. 09/16/15  Yes Verlee Monte, MD  isosorbide mononitrate (IMDUR) 30 MG 24 hr tablet Take 0.5 tablets (15 mg total) by mouth daily. 09/16/15  Yes Verlee Monte, MD  levothyroxine (SYNTHROID, LEVOTHROID) 88 MCG tablet Take 88 mcg by mouth daily before breakfast.    Yes Historical Provider, MD  Multiple Vitamin (MULTIVITAMIN WITH MINERALS) TABS Take 1 tablet by mouth daily.   Yes Historical Provider, MD  potassium chloride SA (K-DUR,KLOR-CON) 20 MEQ tablet Take 1 tablet (20 mEq total) by mouth daily. 01/12/15  Yes Wyatt Portela, MD  TAZTIA XT 360 MG 24 hr capsule Take 360 mg by mouth daily. 08/02/13  Yes Historical Provider, MD  ULORIC 80 MG TABS Take 1 tablet by mouth daily.  04/21/15  Yes Historical Provider, MD   No Known Allergies CBC:  Component Value Date/Time   WBC 8.0 09/20/2015 0439   WBC 6.6 08/25/2015 0948   HGB 13.0 09/20/2015 0439   HGB 12.9* 08/25/2015 0948   HCT 47.8 09/20/2015 0439   HCT 45.7 08/25/2015 0948   PLT 27* 09/20/2015 0439   PLT 84* 08/25/2015 0948   MCV 90.2 09/20/2015 0439   MCV 89.4 08/25/2015 0948   NEUTROABS 6.5 09/17/2015 1440   NEUTROABS 4.8 08/25/2015 0948   LYMPHSABS 1.5 09/17/2015 1440   LYMPHSABS 1.0 08/25/2015 0948   MONOABS 1.6* 09/17/2015 1440   MONOABS 0.6 08/25/2015 0948   EOSABS 0.0 09/17/2015 1440   EOSABS 0.1 08/25/2015 0948   BASOSABS 0.1 09/17/2015 1440   BASOSABS 0.1 08/25/2015 0948   Comprehensive Metabolic Panel:    Component Value Date/Time   NA 143 09/20/2015 0439   NA 139 05/04/2015 0905   K 4.3 09/20/2015 0439   K 4.0 05/04/2015 0905   CL 105 09/20/2015 0439   CO2 29 09/20/2015 0439   CO2 23 05/04/2015 0905   BUN 42* 09/20/2015 0439   BUN 17.9 05/04/2015 0905   CREATININE 1.53* 09/20/2015 0439   CREATININE 1.30*  09/02/2015 1026   CREATININE 1.2 05/04/2015 0905   GLUCOSE 91 09/20/2015 0439   GLUCOSE 140 05/04/2015 0905   CALCIUM 8.2* 09/20/2015 0439   CALCIUM 8.4 05/04/2015 0905   AST 28 09/07/2015 1000   AST 21 05/04/2015 0905   ALT 21 09/07/2015 1000   ALT 29 05/04/2015 0905   ALKPHOS 65 09/07/2015 1000   ALKPHOS 79 05/04/2015 0905   BILITOT 1.4* 09/07/2015 1000   BILITOT 0.59 05/04/2015 0905   PROT 6.3* 09/07/2015 1000   PROT 6.8 05/04/2015 0905   ALBUMIN 2.8* 09/07/2015 1000   ALBUMIN 3.1* 05/04/2015 0905    Review of Systems  Constitutional: Positive for activity change.  Respiratory: Positive for shortness of breath.     Physical Exam  Constitutional: He is oriented to person, place, and time. He appears well-developed and well-nourished.  HENT:  Head: Normocephalic and atraumatic.  Eyes: Pupils are equal, round, and reactive to light.  Cardiovascular: Normal rate, regular rhythm and normal heart sounds.   Respiratory: Effort normal and breath sounds normal.  GI: Soft. Bowel sounds are normal.  Neurological: He is alert and oriented to person, place, and time.  Skin: Skin is warm and dry.    Vital Signs: BP 112/60 mmHg  Pulse 65  Temp(Src) 97.4 F (36.3 C) (Oral)  Resp 16  Ht 5\' 8"  (1.727 m)  Wt 71.1 kg (156 lb 12 oz)  BMI 23.84 kg/m2  SpO2 99% SpO2: Last BM Date: 09/17/15  O2 Device:SpO2: 99 % O2 Flow Rate: .O2 Flow Rate (L/min): 2 L/min Intake/output summary:  Intake/Output Summary (Last 24 hours) at 09/20/15 1323 Last data filed at 09/20/15 1015  Gross per 24 hour  Intake    511 ml  Output      0 ml  Net    511 ml   LBM:  BMP Latest Ref Rng 09/20/2015 09/19/2015 09/18/2015  Glucose 65 - 99 mg/dL 91 137(H) 79  BUN 6 - 20 mg/dL 42(H) 48(H) 48(H)  Creatinine 0.61 - 1.24 mg/dL 1.53(H) 1.70(H) 1.51(H)  Sodium 135 - 145 mmol/L 143 142 144  Potassium 3.5 - 5.1 mmol/L 4.3 4.0 4.1  Chloride 101 - 111 mmol/L 105 106 107  CO2 22 - 32 mmol/L 29 30 28   Calcium 8.9  - 10.3 mg/dL 8.2(L) 8.1(L) 8.2(L)    Baseline Weight:  Weight: 70.788 kg (156 lb 1 oz) Most recent weight: Weight: 71.1 kg (156 lb 12 oz)      Palliative Assessment/Data:  Flowsheet Rows        Most Recent Value   Intake Tab    Referral Department  Hospitalist   Unit at Time of Referral  Cardiac/Telemetry Unit   Palliative Care Primary Diagnosis  Cardiac   Date Notified  09/19/15   Palliative Care Type  Return patient Palliative Care   Reason for referral  Clarify Goals of Care, Non-pain Symptom   Date of Admission  09/17/15   # of days IP prior to Palliative referral  2   Clinical Assessment    Psychosocial & Spiritual Assessment    Palliative Care Outcomes       Additional Data Reviewed: Recent Labs     09/19/15  0513  09/20/15  0439  WBC  7.3  8.0  HGB  12.0*  13.0  PLT  37*  27*  NA  142  143  BUN  48*  42*  CREATININE  1.70*  1.53*    Time In: 1330 Time Out: 1500 Time Total: 90 min Greater than 50%  of this time was spent counseling and coordinating care related to the above assessment and plan.  Signed by: Kyle Lessen, NP  Knox Royalty, NP  09/20/2015, 1:23 PM  Please contact Palliative Medicine Team phone at [phone] for questions and concerns.

## 2015-09-20 NOTE — Progress Notes (Signed)
Patient ID: Kyle Heath, male   DOB: May 09, 1927, 81 y.o.   MRN: [id number]  [facility] PROGRESS NOTE  Kyle Heath EZM:[id number] DOB: [birth year] DOA: 09/17/2015 PCP: Kyle Spurling, MD   Brief narrative:    81 y.o. male with a past medical history that includes diabetes, hypertension, myeloproliferative disorder, atrial flutter, chronic diastolic dysfunction, discharged one day PTA after being hospitalized for CHF, now came back with main concern of persistent weakness, dyspnea at rest and with exertion, difficulty getting out of the bed. No reported fevers, chills, chest pain, no specific focal neurological symptoms.  In Ed, blood work notable for Plt 9, Cr down from 2.16 --> 1.65, imaging studies notable for bilateral pleural effusions. [facility] asked to admit for further evaluation.   Assessment/Plan:    Acute respiratory failure with hypoxia - Presented with oxygen saturation of 85% on room air. - secondary to bilateral pleural effusions, positive for g+ cocci in clusters, Bactrim was started per ID specialist recommendation to complete therapy for total of 7 days - today is day #2/7 of bactrim  - continue home regimen with lasix, respiratory status remains stable so far - place on Bactrim   Acute combined systolic and diastolic congestive heart failure - 2-D echo showed LVEF of 25-30% with diffuse hypokinesis new since 2014. - Low-dose Coreg initiated by cardiology on last admission, may need to hold if SBP < 90 - resumed lasix on admission but SBP was down to 80's 10/31, I have held lasix on 10/31 and will resume half dose of 40 mg PO QD daily today 11/01 (pt takes 80 mg PO QD tablet) - pt denies any dyspnea this AM  HTN, essential - BP was low 10/31 with SBP in 80's and HR was in 50's - I have stopped Imdur and Cardizem, Hydralazine 10/31 until BP and HR stabilize - will restart lasix today as noted above but at the half home dose 40 mg PO QD - if BP and HR remain stable,  try to resume Imdur next   Thrombocytopenia - Patient has a known history of myeloproliferative disorder. Platelet count however, is significantly lower than what it was just a few weeks ago - transfused one unit of Plt on admission - transfuse additional unit of plt 10/30, one unit plt transfused again 11/01 - CBC in AM  Myeloproliferative disorder - Dr. [name] notified via EPIC of pt's admission   Failure to thrive with deconditioning - PT eval requested, SNF recommended, placement in progress   Chronic kidney disease stage III - Cr down since last admission, currently at baseline ~1.5 - BMP in AM  History of hypothyroidism - Continue home medications.  History of atrial flutter - Rate is well controlled. Not on anticoagulation due to bleeding risk secondary to thrombocytopenia.  Severe PCM - nutritionist consulted   DVT prophylaxis - SCD  Code Status: Full.  Family Communication:  plan of care discussed with family at bedside  Disposition Plan: SNF possibly in AM if Plt count stable and if BP stable on Lasix, try to resume Imdur if BP tolerates before discharge   IV access:  Peripheral IV  Procedures and diagnostic studies:    Dg Chest 2 View 09/17/2015  Partial improvement in the interstitial edema and vascular congestion, with persistent effusions and bibasilar atelectasis/consolidation, right greater than left.   Ct Chest Wo Contrast 09/17/2015 Markedly enlarged heart with atherosclerotic disease of the coronary arteries and aorta. Calcifications of the aortic valve annulus. Moderate in size bilateral pleural  effusions, and mild pulmonary edema.  Medical Consultants:  None  Other Consultants:  PT/OT/SW  IAnti-Infectives:   Bactrim 10/31 --> 11/06  Faye Ramsay, MD  [facility] Pager [phone]  If 7PM-7AM, please contact night-coverage [email or web address] Password [email or web address] 09/20/2015, 10:59 AM   LOS: 3 days   HPI/Subjective: No events overnight.    Objective: Filed Vitals:   09/20/15 0439 09/20/15 0850 09/20/15 0915 09/20/15 1015  BP: 107/55 116/62 117/59 123/59  Pulse: 61 69 65 65  Temp: 98.1 F (36.7 C) 97.6 F (36.4 C) 97.5 F (36.4 C) 97.6 F (36.4 C)  TempSrc: Oral Oral Oral Oral  Resp: 18 18 18 18   Height:      Weight: 71.1 kg (156 lb 12 oz)     SpO2: 92% 97% 95% 98%    Intake/Output Summary (Last 24 hours) at 09/20/15 1059 Last data filed at 09/20/15 1015  Gross per 24 hour  Intake    571 ml  Output    100 ml  Net    471 ml    Exam:   General:  Pt is not in acute distress, intermittently confused  Cardiovascular: Regular rate and rhythm, no rubs, no gallops  Respiratory: Clear to auscultation bilaterally, no wheezing, crackles at bases   Abdomen: Soft, non tender, non distended, bowel sounds present, no guarding  Data Reviewed: Basic Metabolic Panel:  Recent Labs Lab 09/15/15 0323 09/17/15 1440 09/18/15 0543 09/19/15 0513 09/20/15 0439  NA 140 144 144 142 143  K 4.5 4.2 4.1 4.0 4.3  CL 103 108 107 106 105  CO2 28 30 28 30 29   GLUCOSE 109* 90 79 137* 91  BUN 58* 54* 48* 48* 42*  CREATININE 2.16* 1.65* 1.51* 1.70* 1.53*  CALCIUM 8.3* 8.4* 8.2* 8.1* 8.2*   CBC:  Recent Labs Lab 09/17/15 1440 09/17/15 1900 09/18/15 0543 09/19/15 0513 09/20/15 0439  WBC 9.7 8.9 9.5 7.3 8.0  NEUTROABS 6.5  --   --   --   --   HGB 14.8 13.8 13.9 12.0* 13.0  HCT 53.3* 49.2 50.1 43.1 47.8  MCV 89.9 88.8 90.1 89.2 90.2  PLT 9* 7* 16* 37* 27*   Cardiac Enzymes:  Recent Labs Lab 09/17/15 1440  TROPONINI <0.03   CBG:  Recent Labs Lab 09/15/15 1119 09/15/15 1640 09/15/15 2109 09/16/15 0541 09/16/15 1106  GLUCAP 105* 95 134* 130* 86    Recent Results (from the past 240 hour(s))  Urine culture     Status: None   Collection Time: 09/17/15  3:00 PM  Result Value Ref Range Status   Specimen Description URINE, RANDOM  Final   Special Requests NONE  Final   Culture   Final    MULTIPLE  SPECIES PRESENT, SUGGEST RECOLLECTION Performed at [facility]    Report Status 09/19/2015 FINAL  Final  Culture, body fluid-bottle     Status: None (Preliminary result)   Collection Time: 09/18/15 11:34 AM  Result Value Ref Range Status   Specimen Description FLUID RIGHT PLEURAL  Final   Special Requests NONE  Final   Gram Stain   Final    GRAM POSITIVE COCCI IN CLUSTERS AEROBIC BOTTLE ONLY CRITICAL RESULT CALLED TO, READ BACK BY AND VERIFIED WITH: A JOHNSON,RN AT 1441 09/19/15 BY L BENFIELD    Culture   Final    GRAM POSITIVE COCCI Performed at [facility]    Report Status PENDING  Incomplete  Gram stain     Status: None   Collection  Time: 09/18/15 11:34 AM  Result Value Ref Range Status   Specimen Description FLUID RIGHT PLEURAL  Final   Special Requests NONE  Final   Gram Stain   Final    WBC PRESENT,BOTH PMN AND MONONUCLEAR NO ORGANISMS SEEN CONFIRMEDC BY K.MATILAINEN Performed at [facility]    Report Status 09/18/2015 FINAL  Final     Scheduled Meds: . sodium chloride   Intravenous Once  . carvedilol  25 mg Oral BID WC  . feeding supplement (ENSURE ENLIVE)  237 mL Oral BID BM  . levothyroxine  88 mcg Oral QAC breakfast  . sodium chloride  3 mL Intravenous Q12H  . sulfamethoxazole-trimethoprim  1 tablet Oral Q12H   Continuous Infusions:

## 2015-09-20 NOTE — Progress Notes (Signed)
NUTRITION NOTE  Consult for assessment of nutrition requirements/status received. Attempted to see pt x4 which was unsuccessful due to other care providers in the room and tech cleaning pt.   Will see pt for full assessment tomorrow (11/2).    Jarome Matin, RD, LDN Inpatient Clinical Dietitian Pager # [phone] After hours/weekend pager # [phone]

## 2015-09-21 DIAGNOSIS — E43 Unspecified severe protein-calorie malnutrition: Secondary | ICD-10-CM | POA: Insufficient documentation

## 2015-09-21 DIAGNOSIS — I472 Ventricular tachycardia: Secondary | ICD-10-CM

## 2015-09-21 DIAGNOSIS — I5042 Chronic combined systolic (congestive) and diastolic (congestive) heart failure: Secondary | ICD-10-CM

## 2015-09-21 LAB — PREPARE PLATELET PHERESIS: Unit division: 0

## 2015-09-21 LAB — PH, BODY FLUID: pH, Body Fluid: 7.5

## 2015-09-21 LAB — CBC
HEMATOCRIT: 48.9 % (ref 39.0–52.0)
HEMOGLOBIN: 13.6 g/dL (ref 13.0–17.0)
MCH: 24.9 pg — ABNORMAL LOW (ref 26.0–34.0)
MCHC: 27.8 g/dL — AB (ref 30.0–36.0)
MCV: 89.6 fL (ref 78.0–100.0)
Platelets: 10 10*3/uL — CL (ref 150–400)
RBC: 5.46 MIL/uL (ref 4.22–5.81)
RDW: 25.4 % — AB (ref 11.5–15.5)
WBC: 7.4 10*3/uL (ref 4.0–10.5)

## 2015-09-21 MED ORDER — SODIUM CHLORIDE 0.9 % IV SOLN
Freq: Once | INTRAVENOUS | Status: AC
  Administered 2015-09-21: 13:00:00 via INTRAVENOUS

## 2015-09-21 MED ORDER — SULFAMETHOXAZOLE-TRIMETHOPRIM 800-160 MG PO TABS: 1.0000 | ORAL_TABLET | Freq: Two times a day (BID) | ORAL | Status: DC

## 2015-09-21 MED ORDER — LEVALBUTEROL HCL 0.63 MG/3ML IN NEBU: 0.6300 mg | INHALATION_SOLUTION | Freq: Four times a day (QID) | RESPIRATORY_TRACT | Status: AC | PRN

## 2015-09-21 MED ORDER — FUROSEMIDE 40 MG PO TABS: 40.0000 mg | ORAL_TABLET | Freq: Every day | ORAL | Status: AC

## 2015-09-21 MED ORDER — ASPIRIN 81 MG PO TABS: 81.0000 mg | ORAL_TABLET | ORAL | Status: AC

## 2015-09-21 MED ORDER — PRO-STAT SUGAR FREE PO LIQD
30.0000 mL | Freq: Two times a day (BID) | ORAL | Status: DC
  Administered 2015-09-21: 30 mL via ORAL
  Filled 2015-09-21: qty 30

## 2015-09-21 NOTE — Progress Notes (Signed)
Initial Nutrition Assessment  DOCUMENTATION CODES:   Severe malnutrition in context of acute illness/injury  INTERVENTION:  - Continue Ensure Enlive BID, each supplement provides 350 kcal and 20 grams of protein - Will order Prostat BID, each supplement provides 100 kcal and 15 grams of protein - Provide encouragement with meals and supplements - RD will continue to monitor for needs  NUTRITION DIAGNOSIS:   Inadequate oral intake related to chronic illness, poor appetite as evidenced by meal completion < 25%.  GOAL:   Patient will meet greater than or equal to 90% of their needs  MONITOR:   PO intake, Supplement acceptance, Weight trends, Labs, Skin, I & O's  REASON FOR ASSESSMENT:   Consult Assessment of nutrition requirement/status  ASSESSMENT:   81 y.o. male with a past medical history that includes diabetes, hypertension, myeloproliferative disorder, atrial flutter, chronic diastolic dysfunction, discharged one day PTA after being hospitalized for CHF, now came back with main concern of persistent weakness, dyspnea at rest and with exertion, difficulty getting out of the bed. No reported fevers, chills, chest pain, no specific focal neurological symptoms.  Pt seen for consult. BMI indicates normal weight status. Per chart review, pt ate 5% of breakfast, 20% of lunch, and 10% of dinner yesterday (11/1). Pt sleeping at time of RD visit with no family or visitors present and did not feel it was necessary to arouse pt at this time.  Physical assessment shows moderate muscle and fat wasting to upper and lower body. Per chart review, pt has lost 12 lbs (7% body weight) in the past 2.5-3 months which is significant for time frame.  Not meeting needs. Medications reviewed. Labs reviewed; CBGs: 86-134 mg/dL, BUN/creatinine elevated but trending down, Ca: 8.2 mg/dL, GFR: 46.   Diet Order:  Diet regular Room service appropriate?: Yes; Fluid consistency:: Thin Diet - low sodium heart  healthy  Skin:  Reviewed, no issues  Last BM:  PTA  Height:   Ht Readings from Last 1 Encounters:  09/17/15 5\' 8"  (1.727 m)    Weight:   Wt Readings from Last 1 Encounters:  09/21/15 158 lb 11.7 oz (72 kg)    Ideal Body Weight:  70 kg (kg)  BMI:  Body mass index is 24.14 kg/(m^2).  Estimated Nutritional Needs:   Kcal:  1700-1900  Protein:  85-95 grams  Fluid:  2 L/day  EDUCATION NEEDS:   No education needs identified at this time     Jarome Matin, RD, LDN Inpatient Clinical Dietitian Pager # [phone] After hours/weekend pager # [phone]

## 2015-09-21 NOTE — Clinical Social Work Placement (Signed)
   CLINICAL SOCIAL WORK PLACEMENT  NOTE  Date:  09/21/2015  Patient Details  Name: Kyle Heath MRN: [id number] Date of Birth: 1927/07/03  Clinical Social Work is seeking post-discharge placement for this patient at the [place] level of care (*CSW will initial, date and re-position this form in  chart as items are completed):  Yes   Patient/family provided with [place] Work Department's list of facilities offering this level of care within the geographic area requested by the patient (or if unable, by the patient's family).  Yes   Patient/family informed of their freedom to choose among providers that offer the needed level of care, that participate in Medicare, Medicaid or managed care program needed by the patient, have an available bed and are willing to accept the patient.  Yes   Patient/family informed of [facility]'s ownership interest in [facility] and [facility], as well as of the fact that they are under no obligation to receive care at these facilities.  PASRR submitted to EDS on       PASRR number received on       Existing PASRR number confirmed on       FL2 transmitted to all facilities in geographic area requested by pt/family on 09/19/15     FL2 transmitted to all facilities within larger geographic area on 09/19/15     Patient informed that his/her managed care company has contracts with or will negotiate with certain facilities, including the following:        Yes   Patient/family informed of bed offers received.  Patient chooses bed at [facility] recommends and patient chooses bed at      Patient to be transferred to [facility] on  .  Patient to be transferred to facility by       Patient family notified on   of transfer.  Name of family member notified:        PHYSICIAN Please sign FL2, Please prepare priority discharge summary, including medications      Additional Comment:    _______________________________________________ Ladell Pier, LCSW 09/21/2015, 1:05 PM

## 2015-09-21 NOTE — Progress Notes (Signed)
Patient for d/c today to SNF bed at [place] rec'd.  Daughter and patient agreeable to this plan- plan transfer via EMS. Eduard Clos, MSW, [place]

## 2015-09-21 NOTE — Progress Notes (Signed)
Pt for discharge to [facility].  CSW facilitated pt discharge needs including contacting facility, faxing pt discharge information to [facility], discussing with pt daughter, [name] via telephone, confirming facility received [employer] insurance authorization,providing RN phone number to call report. RN to arrange ambulance transport when pt ready as pt had to received platelets prior to pt discharge. CSW provided discharge packet and phone number for RN to arrange transport.   No further social work needs identified at this time.  CSW signing off.   Alison Murray, MSW, [place] [facility] [phone]

## 2015-09-21 NOTE — Progress Notes (Signed)
Spoke to Dr Doyle Askew R/T VT, pt asymptomatic.

## 2015-09-21 NOTE — Care Management Important Message (Signed)
Important Message  Patient Details  Name: Kyle Heath MRN: [id number] Date of Birth: 1927-07-14   Medicare Important Message Given:  Yes-second notification given    Camillo Flaming 09/21/2015, 11:00 AMImportant Message  Patient Details  Name: Kyle Heath MRN: [id number] Date of Birth: 1927-08-24   Medicare Important Message Given:  Yes-second notification given    Camillo Flaming 09/21/2015, 10:59 AM

## 2015-09-21 NOTE — Consult Note (Signed)
Patient ID: Kyle Heath MRN: [id number], DOB/AGE: [birth year]   Admit date: 09/17/2015   Primary Physician: Foye Spurling, MD Primary Cardiologist: Dr. Mare Ferrari   Pt. Profile:  81 y/o AAM with a h/o combined systolic + diastolic CHF with EF of 01-77%, HTN, atrial flutter (2014), Type 2 DM, COPD, Myeloproliferative disorder and CVA, admitted for acute hypoxic respiratory failure 2/2 bilateral pleural effusions, s/p thoracentesis.   Problem List  Past Medical History  Diagnosis Date  . Anemia   . Hypothyroidism   . Hypertension   . Myeloproliferative disorder ([place]) 06/30/2012  . Paroxysmal atrial flutter ([place]) 2014  . Chronic combined systolic and diastolic CHF (congestive heart failure) ([place])     a. EF in 2014 55-60% b. EF in 08/2015 25-30%  . Gout   . BPH (benign prostatic hypertrophy)   . COPD (chronic obstructive pulmonary disease) ([place])   . Bone marrow disorder   . CVA (cerebral vascular accident) ([place])   . Type II diabetes mellitus ([place])   . History of blood transfusion "several"  . Dementia   . Arthritis     "mostly in joints" (09/07/2015)  . Use of cane as ambulatory aid     Past Surgical History  Procedure Laterality Date  . Appendectomy    . Thyroid surgery    . Cyst removal neck  2009     Allergies  No Known Allergies  HPI  81 y/o AAM with a h/o CHF, HTN, atrial flutter (2014), Type 2 DM, COPD, Myeloproliferative disorder and CVA. He recently presented to the hospital 09/07/15 with worsening dyspnea, fatigue and a UTI. He was found to have acute combined systolic + diastolic CHF. Echo on 09/07/2015 showed EF of 25-30%, diffuse hypokinesis, mild aortic regurg, mild mitral regurg, moderately dilated LA, RA, and RV. Previous echo was in 12/2012 which showed EF of 55-60% and Grade 2 diastolic dysfunction. CXR on 09/07/2015 showed diffuse bilateral airspace disease likely to represent edema and a moderate right effusion with focal right lower lobe opacity.  BNP was elevated to 1729. He was treated with lasix and medical therapy for his systolic dysfunction. He did not undergo a cath nor stress test. He was discharged home on 10/28.  He presented back to [facility] on 10/29 with complain persistent weakness, dyspnea at rest and with exertion and difficulty getting out of the bed. He was hypoxic on arrival with O2 sats  at 85% on RA. CXR showed bilateral pleural effusions. IR was consulted for thoracentesis. Fluid analysis positive for g+ cocci in clusters. Bactrim was started per ID specialist with recommendation to complete therapy for a total of 7 days.   Plan was to discharge to SNF today however, we have been consulteded for NSVT captured on telemetry earlier today. He remained asymptomatic.  K is stable at 4.3. He is on 25 mg of Coreg BID. His other HF therapies were discontinued due to hypotension.   Home Medications  Prior to Admission medications   Medication Sig Start Date End Date Taking? Authorizing Provider  acetaminophen (TYLENOL) 500 MG tablet Take 500 mg by mouth every 6 (six) hours as needed for mild pain.   Yes Historical Provider, MD  carvedilol (COREG) 25 MG tablet Take 25 mg by mouth 2 (two) times daily with a meal.  08/07/14  Yes Historical Provider, MD  hydrALAZINE (APRESOLINE) 10 MG tablet Take 1 tablet (10 mg total) by mouth every 8 (eight) hours. 09/16/15  Yes Verlee Monte, MD  isosorbide mononitrate (IMDUR)  30 MG 24 hr tablet Take 0.5 tablets (15 mg total) by mouth daily. 09/16/15  Yes Verlee Monte, MD  levothyroxine (SYNTHROID, LEVOTHROID) 88 MCG tablet Take 88 mcg by mouth daily before breakfast.    Yes Historical Provider, MD  Multiple Vitamin (MULTIVITAMIN WITH MINERALS) TABS Take 1 tablet by mouth daily.   Yes Historical Provider, MD  potassium chloride SA (K-DUR,KLOR-CON) 20 MEQ tablet Take 1 tablet (20 mEq total) by mouth daily. 01/12/15  Yes Wyatt Portela, MD  TAZTIA XT 360 MG 24 hr capsule Take 360 mg by mouth daily. 08/02/13   Yes Historical Provider, MD  ULORIC 80 MG TABS Take 1 tablet by mouth daily.  04/21/15  Yes Historical Provider, MD  aspirin 81 MG tablet Take 1 tablet (81 mg total) by mouth every other day. 09/21/15   Theodis Blaze, MD  furosemide (LASIX) 40 MG tablet Take 1 tablet (40 mg total) by mouth daily. 09/21/15   Theodis Blaze, MD  levalbuterol (XOPENEX) 0.63 MG/3ML nebulizer solution Take 3 mLs (0.63 mg total) by nebulization every 6 (six) hours as needed for wheezing or shortness of breath. 09/21/15   Theodis Blaze, MD  sulfamethoxazole-trimethoprim (BACTRIM DS,SEPTRA DS) 800-160 MG tablet Take 1 tablet by mouth 2 (two) times daily. 09/21/15   Theodis Blaze, MD    Family History  Family History  Problem Relation Age of Onset  . Heart disease Neg Hx   . Heart failure Neg Hx   . Prostate cancer      Social History  Social History   Social History  . Marital Status: Widowed    Spouse Name: N/A  . Number of Children: N/A  . Years of Education: N/A   Occupational History  . Not on file.   Social History Main Topics  . Smoking status: Former Smoker    Types: Cigarettes, Cigars  . Smokeless tobacco: Current User    Types: Chew     Comment: 09/07/2015 "don't know of any cigarettes/cigar smoking"  . Alcohol Use: No  . Drug Use: No  . Sexual Activity: No   Other Topics Concern  . Not on file   Social History Narrative   Lives in a house with his daughter.  Does not use a cane or walker.   He drives (but is not supposed to).                Review of Systems General:  No chills, fever, night sweats or weight changes.  Cardiovascular:  No chest pain, dyspnea on exertion, edema, orthopnea, palpitations, paroxysmal nocturnal dyspnea. Dermatological: No rash, lesions/masses Respiratory: No cough, dyspnea Urologic: No hematuria, dysuria Abdominal:   No nausea, vomiting, diarrhea, bright red blood per rectum, melena, or hematemesis Neurologic:  No visual changes, wkns, changes in mental  status. All other systems reviewed and are otherwise negative except as noted above.  Physical Exam  Blood pressure 118/63, pulse 80, temperature 98 F (36.7 C), temperature source Oral, resp. rate 16, height 5' 8"  (1.727 m), weight 158 lb 11.7 oz (72 kg), SpO2 93 %.  General: Pleasant, NAD Psych: Normal affect. Neuro: Alert and oriented X 3. Moves all extremities spontaneously. HEENT: Normal  Neck: Supple without bruits or JVD. Lungs:  Decreased BS at the bases. Heart: RRR no s3, s4, or murmurs. Abdomen: Soft, non-tender, non-distended, BS + x 4.  Extremities: No clubbing, cyanosis or edema. DP/PT/Radials 2+ and equal bilaterally.  Labs  Troponin [facility] Test) No results  for input(s): TROPIPOC in the last 72 hours. No results for input(s): CKTOTAL, CKMB, TROPONINI in the last 72 hours. Lab Results  Component Value Date   WBC 8.0 09/20/2015   HGB 13.0 09/20/2015   HCT 47.8 09/20/2015   MCV 90.2 09/20/2015   PLT 27* 09/20/2015    Recent Labs Lab 09/20/15 0439  NA 143  K 4.3  CL 105  CO2 29  BUN 42*  CREATININE 1.53*  CALCIUM 8.2*  GLUCOSE 91   No results found for: CHOL, HDL, LDLCALC, TRIG Lab Results  Component Value Date   DDIMER 0.57* 09/07/2015     Radiology/Studies  Dg Chest 1 View  09/18/2015  CLINICAL DATA:  Status post thoracentesis. EXAM: CHEST 1 VIEW COMPARISON:  Chest x-ray dated 09/17/2015. FINDINGS: Cardiomediastinal silhouette is stable in size and configuration. Cardiomegaly again noted. Decreased size of the right pleural effusion status post thoracentesis. Persistent opacity at the right lung base is likely residual atelectasis. Probable small left pleural effusion with adjacent atelectasis, unchanged. Mild interstitial edema bilaterally, left greater than right, is unchanged. IMPRESSION: 1. Decreased size of the right pleural effusion status post thoracentesis. No pneumothorax or other postprocedural complicating feature seen. 2.  Cardiomegaly with central pulmonary vascular congestion and persistent mild bilateral interstitial edema suggesting continued mild volume overload/CHF. 3. Probable small left pleural effusion with adjacent atelectasis, unchanged. Electronically Signed   By: Franki Cabot M.D.   On: 09/18/2015 11:41   Dg Chest 2 View  09/17/2015  CLINICAL DATA:  Pt and family report that pt was admitted for SOB and fluid on the lungs from 10/19-10/28, pt started having SOB Friday night and continues today. Pt denies pain. Family reports legs are swollen and belly is distended. H/o CHF EXAM: CHEST - 2 VIEW COMPARISON:  09/11/2015 FINDINGS: Stable cardiomegaly. Central pulmonary vascular congestion with some improvement in the interstitial edema seen previously. Moderate right and tiny left pleural effusions. Atelectasis/ consolidation in the lung bases right greater than left. Atheromatous aorta. Visualized skeletal structures are unremarkable. IMPRESSION: 1. Partial improvement in the interstitial edema and vascular congestion, with persistent effusions and bibasilar atelectasis/consolidation, right greater than left. Electronically Signed   By: Lucrezia Europe M.D.   On: 09/17/2015 15:02   Dg Chest 2 View  09/11/2015  CLINICAL DATA:  Pleural effusion, CHF EXAM: CHEST  2 VIEW COMPARISON:  09/07/2015 FINDINGS: Cardiomegaly with mild interstitial edema. Moderate right and small left pleural effusions. No pneumothorax. No interval change. IMPRESSION: Right mid lobe mild interstitial edema. Moderate right and small left pleural effusions. Electronically Signed   By: Julian Hy M.D.   On: 09/11/2015 09:58   Dg Chest 2 View  09/07/2015  CLINICAL DATA:  Weakness. EXAM: CHEST  2 VIEW COMPARISON:  01/04/2013 FINDINGS: Diffuse bilateral airspace opacities are noted. Moderate right effusion with more confluent opacity at the right lung base. No definite effusion on the left. Heart is mildly enlarged. No acute bony abnormality.  IMPRESSION: Diffuse bilateral airspace disease, likely edema. Moderate right effusion with focal right lower lobe opacity which could also reflect edema or infection. Electronically Signed   By: Rolm Baptise M.D.   On: 09/07/2015 11:09   Ct Chest Wo Contrast  09/17/2015  CLINICAL DATA:  Shortness of breath, lower extremity swelling, abdominal distention. EXAM: CT CHEST WITHOUT CONTRAST TECHNIQUE: Multidetector CT imaging of the chest was performed following the standard protocol without IV contrast. COMPARISON:  None. Chest CT dated 09/17/2015 FINDINGS: Mediastinum/Lymph Nodes: No masses or pathologically enlarged  lymph nodes identified on this un-enhanced exam. The heart is markedly enlarged. There is no evidence of significant pericardial effusion. Atherosclerotic calcifications are noted of the coronary arteries, aorta and aortic valve annulus. The aorta is torturous without evidence of aneurysmal dilation. Lungs/Pleura: There are moderate in size bilateral pleural effusions with subsegmental atelectasis. There is also mild interstitial and alveolar pulmonary edema. No evidence of focal airspace consolidation. Upper abdomen: No acute findings. Musculoskeletal: No chest wall mass or suspicious bone lesions identified. IMPRESSION: Markedly enlarged heart with atherosclerotic disease of the coronary arteries and aorta. Calcifications of the aortic valve annulus. Moderate in size bilateral pleural effusions, and mild pulmonary edema. Electronically Signed   By: Fidela Salisbury M.D.   On: 09/17/2015 17:21   US Renal  09/09/2015  CLINICAL DATA:  Acute renal failure. EXAM: RENAL / URINARY TRACT ULTRASOUND COMPLETE COMPARISON:  None. FINDINGS: Right Kidney: Length: 11.3 cm. Normal renal cortical thickness. Slight increased echogenicity suggesting medical renal disease. No hydronephrosis. Left Kidney: Length: 12.1 cm. Normal renal cortical thickness. Slight increased echogenicity suggesting medical renal  disease. No hydronephrosis. Bladder: Poorly distended bladder with apparent wall thickening. Prostate gland enlargement. IMPRESSION: 1. Slight increased echogenicity of both kidneys suggesting medical renal disease. Normal renal cortical thickness and no hydronephrosis. 2. Poorly distended bladder with mild apparent wall thickening. 3. Prostate gland enlargement. Electronically Signed   By: Marijo Sanes M.D.   On: 09/09/2015 13:51   US Thoracentesis Asp Pleural Space W/img Guide  09/18/2015  CLINICAL DATA:  Congestive heart failure, bilateral pleural effusions. Request for diagnostic and therapeutic thoracentesis EXAM: ULTRASOUND GUIDED RIGHT THORACENTESIS COMPARISON:  None. PROCEDURE: An ultrasound guided thoracentesis was thoroughly discussed with the patient and questions answered. The benefits, risks, alternatives and complications were also discussed. The patient understands and wishes to proceed with the procedure. Written consent was obtained. Ultrasound was performed to localize and mark an adequate pocket of fluid in the right chest. The area was then prepped and draped in the normal sterile fashion. 1% Lidocaine was used for local anesthesia. Under ultrasound guidance a Safe T Centesis catheter was introduced. Thoracentesis was performed. The catheter was removed and a dressing applied. COMPLICATIONS: None immediate. FINDINGS: A total of approximately 830 mls of blood tinged fluid was removed. A fluid sample wassent for laboratory analysis. IMPRESSION: Successful ultrasound guided right thoracentesis yielding 830 mls of pleural fluid. Read by:  Gareth Eagle, PA-C Electronically Signed   By: Lucrezia Europe M.D.   On: 09/18/2015 11:10    ASSESSMENT AND PLAN  Active Problems:   Hypoxia   Thrombocytopenia (HCC)   Bilateral pleural effusion   Renal insufficiency   Dyspnea   Pleural effusion, right   Palliative care encounter   DNR (do not resuscitate) discussion   Protein-calorie malnutrition,  severe   1. NSVT:  in the setting of known LV systolic dysfunction with EF of 35-40%. Continue BB therapy. He is already on the maximum dose of Coreg, 25 mg BID. Other HF therapies including his ACE, Imdur and hydralazine were discontinued due to hypotension. He has been asymptomatic with is arrhthymias.  Palliative care met with family to discuss goal of care. Depsite his advanced age and poor prognosis, his family has decided to keep full Code. Not much more to add. Dr. Radford Pax to see and will provide further recommendations.   2. Chronic Combined Systolic + Diastolic CHF: volume is stable on PO lasix. Low sodium diet.    SignedLyda Jester, PA-C 09/21/2015, 11:42 AM

## 2015-09-21 NOTE — Discharge Summary (Signed)
Physician Discharge Summary  Kyle Heath TFT:[id number] DOB: 12/07/1926 DOA: 09/17/2015  PCP: Foye Spurling, MD  Admit date: 09/17/2015 Discharge date: 09/21/2015  Recommendations for Outpatient Follow-up:  1. Pt will need to follow up with PCP in 1 weeks post discharge 2. Please obtain BMP to evaluate electrolytes and kidney function 3. Please also check CBC in 48 hours to evaluate Hg and Hct levels, Plt 4. Please note that pt sees oncologist Dr. [name] 5. If Plt count is < 10000, will need transfusion  6. Please note that Imdur, Cardia, Hydralazine stopped due to hypotension  7. Palliative care was involved in pt's care, pt remains full code  8. Pt will need to complete 5 more days of bactrim upon discharge   Discharge Diagnoses:  Active Problems:   Hypoxia   Thrombocytopenia (HCC)   Bilateral pleural effusion   Renal insufficiency   Dyspnea   Pleural effusion, right   Palliative care encounter   DNR (do not resuscitate) discussion   Protein-calorie malnutrition, severe  Discharge Condition: Stable  Diet recommendation: Heart healthy diet discussed in details    Brief narrative:    81 y.o. male with a past medical history that includes diabetes, hypertension, myeloproliferative disorder, atrial flutter, chronic diastolic dysfunction, discharged one day PTA after being hospitalized for CHF, now came back with main concern of persistent weakness, dyspnea at rest and with exertion, difficulty getting out of the bed. No reported fevers, chills, chest pain, no specific focal neurological symptoms.  In Ed, blood work notable for Plt 9, Cr down from 2.16 --> 1.65, imaging studies notable for bilateral pleural effusions. TRH asked to admit for further evaluation.   Assessment/Plan:    Acute respiratory failure with hypoxia - Presented with oxygen saturation of 85% on room air. - secondary to bilateral pleural effusions, positive for g+ cocci in clusters, Bactrim started   - pt will need to complete 5 more days of Bactrim per ID team recommendations  - continue home regimen with lasix, respiratory status remains stable so far  Acute combined systolic and diastolic congestive heart failure - 2-D echo showed LVEF of 25-30% with diffuse hypokinesis new since 2014. - Coreg initiated by cardiology on last admission, may need to hold if SBP < 90 - resumed lasix on admission but SBP was down to 80's 10/31, I have held lasix on 10/31 and have resumed half dose of 40 mg PO QD daily starting 11/01 (pt takes 80 mg PO QD tablet) - pt denies any dyspnea this AM  HTN, essential - BP was low 10/31 with SBP in 80's and HR was in 50's - I have stopped Imdur and Cardizem, Hydralazine 10/31 until BP and HR stabilize - restarted lasix today as noted above but at the half home dose 40 mg PO QD - if BP and HR remain stable, try to resume Imdur next   Thrombocytopenia - Patient has a known history of myeloproliferative disorder. Platelet count however, is significantly lower than what it was just a few weeks ago - transfused one unit of Plt on admission - transfused additional unit of plt 10/30, one unit plt transfused again 11/01 - transfuse prior to discharge one unit   Myeloproliferative disorder - Dr. [name] notified via EPIC of pt's admission   Failure to thrive with deconditioning - PT eval requested, SNF recommended  Chronic kidney disease stage III - Cr down since last admission, currently at baseline ~1.5  History of hypothyroidism - Continue home medications.  History of  atrial flutter - Rate is well controlled. Not on anticoagulation due to bleeding risk secondary to thrombocytopenia.  Severe PCM - nutritionist consulted   Code Status: Full.  Family Communication: plan of care discussed with family at bedside  Disposition Plan: SNF   IV access:  Peripheral IV  Procedures and diagnostic studies:   Dg Chest 2 View 09/17/2015 Partial  improvement in the interstitial edema and vascular congestion, with persistent effusions and bibasilar atelectasis/consolidation, right greater than left.   Ct Chest Wo Contrast 09/17/2015 Markedly enlarged heart with atherosclerotic disease of the coronary arteries and aorta. Calcifications of the aortic valve annulus. Moderate in size bilateral pleural effusions, and mild pulmonary edema.  Medical Consultants:  None  Other Consultants:  PT/OT/SW  IAnti-Infectives:   Bactrim 10/31 --> 11/06     Discharge Exam: Filed Vitals:   09/21/15 0823  BP: 118/63  Pulse: 80  Temp:   Resp:    Filed Vitals:   09/20/15 1300 09/20/15 2027 09/21/15 0409 09/21/15 0823  BP: 112/60 101/47 118/60 118/63  Pulse: 65 73 73 80  Temp: 97.4 F (36.3 C) 98.2 F (36.8 C) 98 F (36.7 C)   TempSrc: Oral Oral Oral   Resp: 16 16 16    Height:      Weight:   72 kg (158 lb 11.7 oz)   SpO2: 99% 95% 93%    General: Pt is alert, not in acute distress Cardiovascular: Regular rate and rhythm, no rubs, no gallops Respiratory: Clear to auscultation bilaterally, no wheezing, no crackles, no rhonchi Abdominal: Soft, non tender, non distended, bowel sounds +, no guarding  Discharge Instructions  Discharge Instructions    Diet - low sodium heart healthy    Complete by:  As directed      Increase activity slowly    Complete by:  As directed             Medication List    STOP taking these medications        hydrALAZINE 10 MG tablet  Commonly known as:  APRESOLINE     isosorbide mononitrate 30 MG 24 hr tablet  Commonly known as:  IMDUR     TAZTIA XT 360 MG 24 hr capsule  Generic drug:  diltiazem      TAKE these medications        acetaminophen 500 MG tablet  Commonly known as:  TYLENOL  Take 500 mg by mouth every 6 (six) hours as needed for mild pain.     aspirin 81 MG tablet  Take 1 tablet (81 mg total) by mouth every other day.     carvedilol 25 MG tablet  Commonly known as:   COREG  Take 25 mg by mouth 2 (two) times daily with a meal.     furosemide 40 MG tablet  Commonly known as:  LASIX  Take 1 tablet (40 mg total) by mouth daily.     levalbuterol 0.63 MG/3ML nebulizer solution  Commonly known as:  XOPENEX  Take 3 mLs (0.63 mg total) by nebulization every 6 (six) hours as needed for wheezing or shortness of breath.     levothyroxine 88 MCG tablet  Commonly known as:  SYNTHROID, LEVOTHROID  Take 88 mcg by mouth daily before breakfast.     multivitamin with minerals Tabs tablet  Take 1 tablet by mouth daily.     potassium chloride SA 20 MEQ tablet  Commonly known as:  K-DUR,KLOR-CON  Take 1 tablet (20 mEq total) by mouth daily.  sulfamethoxazole-trimethoprim 800-160 MG tablet  Commonly known as:  BACTRIM DS,SEPTRA DS  Take 1 tablet by mouth 2 (two) times daily.     ULORIC 80 MG Tabs  Generic drug:  Febuxostat  Take 1 tablet by mouth daily.           Follow-up Information    Follow up with Foye Spurling, MD.   Specialty:  Internal Medicine   Contact information:   [address] [place]mann [place] [address] [phone]        The results of significant diagnostics from this hospitalization (including imaging, microbiology, ancillary and laboratory) are listed below for reference.     Microbiology: Recent Results (from the past 240 hour(s))  Urine culture     Status: None   Collection Time: 09/17/15  3:00 PM  Result Value Ref Range Status   Specimen Description URINE, RANDOM  Final   Special Requests NONE  Final   Culture   Final    MULTIPLE SPECIES PRESENT, SUGGEST RECOLLECTION Performed at [facility]    Report Status 09/19/2015 FINAL  Final  Culture, body fluid-bottle     Status: None (Preliminary result)   Collection Time: 09/18/15 11:34 AM  Result Value Ref Range Status   Specimen Description FLUID RIGHT PLEURAL  Final   Special Requests NONE  Final   Gram Stain   Final    GRAM POSITIVE COCCI IN  CLUSTERS AEROBIC BOTTLE ONLY CRITICAL RESULT CALLED TO, READ BACK BY AND VERIFIED WITH: A JOHNSON,RN AT 5638 09/19/15 BY L BENFIELD    Culture   Final    GRAM POSITIVE COCCI Performed at [facility]    Report Status PENDING  Incomplete  Gram stain     Status: None   Collection Time: 09/18/15 11:34 AM  Result Value Ref Range Status   Specimen Description FLUID RIGHT PLEURAL  Final   Special Requests NONE  Final   Gram Stain   Final    WBC PRESENT,BOTH PMN AND MONONUCLEAR NO ORGANISMS SEEN CONFIRMEDC BY K.MATILAINEN Performed at [facility]    Report Status 09/18/2015 FINAL  Final     Labs: Basic Metabolic Panel:  Recent Labs Lab 09/15/15 0323 09/17/15 1440 09/18/15 0543 09/19/15 0513 09/20/15 0439  NA 140 144 144 142 143  K 4.5 4.2 4.1 4.0 4.3  CL 103 108 107 106 105  CO2 28 30 28 30 29   GLUCOSE 109* 90 79 137* 91  BUN 58* 54* 48* 48* 42*  CREATININE 2.16* 1.65* 1.51* 1.70* 1.53*  CALCIUM 8.3* 8.4* 8.2* 8.1* 8.2*   CBC:  Recent Labs Lab 09/17/15 1440 09/17/15 1900 09/18/15 0543 09/19/15 0513 09/20/15 0439  WBC 9.7 8.9 9.5 7.3 8.0  NEUTROABS 6.5  --   --   --   --   HGB 14.8 13.8 13.9 12.0* 13.0  HCT 53.3* 49.2 50.1 43.1 47.8  MCV 89.9 88.8 90.1 89.2 90.2  PLT 9* 7* 16* 37* 27*   Cardiac Enzymes:  Recent Labs Lab 09/17/15 1440  TROPONINI <0.03   BNP: BNP (last 3 results)  Recent Labs  09/07/15 1657 09/17/15 1440  BNP 1729.4* 1199.9*   CBG:  Recent Labs Lab 09/15/15 1119 09/15/15 1640 09/15/15 2109 09/16/15 0541 09/16/15 1106  GLUCAP 105* 95 134* 130* 86     SIGNED: Time coordinating discharge: O30 minutes  Faye Ramsay, MD  [facility] 09/21/2015, 11:57 AM Pager [phone]  If 7PM-7AM, please contact night-coverage [email or web address] Password [email or web address]

## 2015-09-21 NOTE — Discharge Instructions (Signed)
Respiratory failure is when your lungs are not working well and your breathing (respiratory) system fails. When respiratory failure occurs, it is difficult for your lungs to get enough oxygen, get rid of carbon dioxide, or both. Respiratory failure can be life threatening.  °Respiratory failure can be acute or chronic. Acute respiratory failure is sudden, severe, and requires emergency medical treatment. Chronic respiratory failure is less severe, happens over time, and requires ongoing treatment.  °WHAT ARE THE CAUSES OF ACUTE RESPIRATORY FAILURE?  °Any problem affecting the heart or lungs can cause acute respiratory failure. Some of these causes include the following: °· Chronic bronchitis and emphysema (COPD).   °· Blood clot going to a lung (pulmonary embolism).   °· Having water in the lungs caused by heart failure, lung injury, or infection (pulmonary edema).   °· Collapsed lung (pneumothorax).   °· Pneumonia.   °· Pulmonary fibrosis.   °· Obesity.   °· Asthma.   °· Heart failure.   °· Any type of trauma to the chest that can make breathing difficult.   °· Nerve or muscle diseases making chest movements difficult. °HOW WILL MY ACUTE RESPIRATORY FAILURE BE TREATED?  °Treatment of acute respiratory failure depends on the cause of the respiratory failure. Usually, you will stay in the intensive care unit so your breathing can be watched closely. Treatment can include the following: °· Oxygen. Oxygen can be delivered through the following: °¨ Nasal cannula. This is small tubing that goes in your nose to give you oxygen. °¨ Face mask. A face mask covers your nose and mouth to give you oxygen. °· Medicine. Different medicines can be given to help with breathing. These can include: °¨ Nebulizers. Nebulizers deliver medicines to open the air passages (bronchodilators). These medicines help to open or relax the airways in the lungs so you can breathe better. They can also help loosen mucus from your  lungs. °¨ Diuretics. Diuretic medicines can help you breathe better by getting rid of extra water in your body. °¨ Steroids. Steroid medicines can help decrease swelling (inflammation) in your lungs. °¨ Antibiotics. °· Chest tube. If you have a collapsed lung (pneumothorax), a chest tube is placed to help reinflate the lung. °· Noninvasive positive pressure ventilation (NPPV). This is a tight-fitting mask that goes over your nose and mouth. The mask has tubing that is attached to a machine. The machine blows air into the tubing, which helps to keep the tiny air sacs (alveoli) in your lungs open. This machine allows you to breathe on your own. °· Ventilator. A ventilator is a breathing machine. When on a ventilator, a breathing tube is put into the lungs. A ventilator is used when you can no longer breathe well enough on your own. You may have low oxygen levels or high carbon dioxide (CO2) levels in your blood. When you are on a ventilator, sedation and pain medicines are given to make you sleep so your lungs can heal. °SEEK IMMEDIATE MEDICAL CARE IF: °· You have shortness of breath (dyspnea) with or without activity. °· You have rapid breathing (tachypnea). °· You are wheezing. °· You are unable to say more than a few words without having to catch your breath. °· You find it very difficult to function normally. °· You have a fast heart rate. °· You have a bluish color to your finger or toe nail beds. °· You have confusion or drowsiness or both. °  °This information is not intended to replace advice given to you by your health care provider. Make sure you discuss   any questions you have with your health care provider. °  °Document Released: 11/10/2013 Document Revised: 07/27/2015 Document Reviewed: 11/10/2013 °Elsevier Interactive Patient Education ©2016 [employer] ° °

## 2015-09-21 NOTE — Progress Notes (Signed)
Occupational Therapy Treatment Patient Details Name: Kyle Heath MRN: [id number] DOB: May 22, 1927 Today's Date: 09/21/2015    History of present illness 81 y.o. male with a past medical history that includes dementia, diabetes, hypertension, myeloproliferative disorder, atrial flutter, chronic diastolic dysfunction, discharged one day PTA after being hospitalized for CHF, now came back with main concern of persistent weakness, dyspnea at rest and with exertion, difficulty getting out of the bed   OT comments  Pt up to recliner and needed min assist and cues for safe use of walker. Pt performed simple grooming from recliner. Cues for thoroughness. Recommend SNF.    Follow Up Recommendations  SNF;Supervision/Assistance - 24 hour    Equipment Recommendations  None recommended by OT    Recommendations for Other Services      Precautions / Restrictions Precautions Precautions: Fall Precaution Comments: monitor BP Restrictions Weight Bearing Restrictions: No       Mobility Bed Mobility Overal bed mobility: Needs Assistance Bed Mobility: Supine to Sit     Supine to sit: Min assist     General bed mobility comments: assist for trunk to upright.   Transfers Overall transfer level: Needs assistance Equipment used: Rolling walker (2 wheeled) Transfers: Sit to/from Stand Sit to Stand: Min assist Stand pivot transfers: Min assist       General transfer comment: Min assist to rise and steady and cues for hand placement.     Balance                                   ADL       Grooming: Wash/dry face;Set up;Sitting                   Toilet Transfer: Minimal assistance;Stand-pivot;RW             General ADL Comments: Pt up to EOB with min assist. Pt trying to pull on therapist but OT encouraged pt to place hand on bed to try to self assist. Up to chair with walker but needed cues to safely use walker as he tends to step around the  walker/outside of it. Pt stating he was cold so room temperature adjusted and blankets placed around pt. Pt did work on grooming task from [employer]. He declined needing to go to the bathroom and has catheter.        Vision                     Perception     Praxis      Cognition   Behavior During Therapy: WFL for tasks assessed/performed Overall Cognitive Status: History of cognitive impairments - at baseline                       Extremity/Trunk Assessment               Exercises     Shoulder Instructions       General Comments      Pertinent Vitals/ Pain       Pain Assessment: No/denies pain  Home Living                                          Prior Functioning/Environment              Frequency  Min 2X/week     Progress Toward Goals  OT Goals(current goals can now be found in the care plan section)  Progress towards OT goals: Progressing toward goals (more difficulty with bed mobility)     Plan Discharge plan remains appropriate    Co-evaluation                 End of Session Equipment Utilized During Treatment: Rolling walker   Activity Tolerance Patient tolerated treatment well   Patient Left in chair;with call bell/phone within reach;with chair alarm set   Nurse Communication          Time: 1129-1150 OT Time Calculation (min): 21 min  Charges: OT General Charges $OT Visit: 1 Procedure OT Treatments $Therapeutic Activity: 8-22 mins  Jules Schick  [phone] 09/21/2015, 12:18 PM

## 2015-09-21 NOTE — Progress Notes (Signed)
PT Cancellation Note  Patient Details Name: Keanon Bevins MRN: [id number] DOB: 06/23/1927   Cancelled Treatment:     Cancelled per RN request to allow patient to sleep    Denna Haggard, SPTA   09/21/2015 12:56 PM   Pager: [place]  PTA [facility]  Acute  Rehab Pager      [phone]

## 2015-09-21 NOTE — Clinical Social Work Placement (Signed)
   CLINICAL SOCIAL WORK PLACEMENT  NOTE  Date:  09/21/2015  Patient Details  Name: Kyle Heath MRN: [id number] Date of Birth: 1927-08-12  Clinical Social Work is seeking post-discharge placement for this patient at the [facility] level of care (*CSW will initial, date and re-position this form in  chart as items are completed):  Yes   Patient/family provided with [place] Work Department's list of facilities offering this level of care within the geographic area requested by the patient (or if unable, by the patient's family).  Yes   Patient/family informed of their freedom to choose among providers that offer the needed level of care, that participate in Medicare, Medicaid or managed care program needed by the patient, have an available bed and are willing to accept the patient.  Yes   Patient/family informed of [place]'s ownership interest in [facility] and [facility], as well as of the fact that they are under no obligation to receive care at these facilities.  PASRR submitted to EDS on       PASRR number received on       Existing PASRR number confirmed on       FL2 transmitted to all facilities in geographic area requested by pt/family on 09/19/15     FL2 transmitted to all facilities within larger geographic area on 09/19/15     Patient informed that his/her managed care company has contracts with or will negotiate with certain facilities, including the following:        Yes   Patient/family informed of bed offers received.  Patient chooses bed at [place] recommends and patient chooses bed at      Patient to be transferred to [facility] on 09/21/15.  Patient to be transferred to facility by ambulance [facility])     Patient family notified on 09/21/15 of transfer.  Name of family member notified:  pt daughter, [name] notified via telephone     PHYSICIAN Please sign FL2, Please  prepare priority discharge summary, including medications     Additional Comment:    _______________________________________________ Ladell Pier, LCSW 09/21/2015, 4:35 PM

## 2015-09-21 NOTE — Progress Notes (Signed)
Report called to [facility] spoke to [employer] LPN

## 2015-09-22 ENCOUNTER — Encounter: Payer: Self-pay | Admitting: Internal Medicine

## 2015-09-22 ENCOUNTER — Ambulatory Visit: Payer: Medicare Other

## 2015-09-22 ENCOUNTER — Other Ambulatory Visit: Payer: Medicare Other

## 2015-09-22 ENCOUNTER — Non-Acute Institutional Stay (SKILLED_NURSING_FACILITY): Payer: Medicare Other | Admitting: Internal Medicine

## 2015-09-22 DIAGNOSIS — E43 Unspecified severe protein-calorie malnutrition: Secondary | ICD-10-CM

## 2015-09-22 DIAGNOSIS — E440 Moderate protein-calorie malnutrition: Secondary | ICD-10-CM | POA: Diagnosis not present

## 2015-09-22 DIAGNOSIS — J9601 Acute respiratory failure with hypoxia: Secondary | ICD-10-CM

## 2015-09-22 DIAGNOSIS — I10 Essential (primary) hypertension: Secondary | ICD-10-CM

## 2015-09-22 DIAGNOSIS — D696 Thrombocytopenia, unspecified: Secondary | ICD-10-CM

## 2015-09-22 DIAGNOSIS — I5043 Acute on chronic combined systolic (congestive) and diastolic (congestive) heart failure: Secondary | ICD-10-CM | POA: Diagnosis not present

## 2015-09-22 DIAGNOSIS — D471 Chronic myeloproliferative disease: Secondary | ICD-10-CM

## 2015-09-22 LAB — PREPARE PLATELET PHERESIS: UNIT DIVISION: 0

## 2015-09-22 LAB — CULTURE, BODY FLUID W GRAM STAIN -BOTTLE

## 2015-09-22 LAB — CULTURE, BODY FLUID-BOTTLE

## 2015-09-22 NOTE — Progress Notes (Signed)
Patient ID: Kyle Heath, male   DOB: 23-Jan-1927, 81 y.o.   MRN: [id number]    HISTORY AND PHYSICAL   DATE: 09/22/15  Location:  [facility]    Place of Service: SNF (31)   Extended Emergency Contact Information Primary Emergency Contact: Branum,Lisa Address: [address]          [place], [place] [address] [place] Phone: [phone] Mobile Phone: [phone] Relation: Daughter Secondary Emergency Contact: Roselind Rily [place] Mobile Phone: [phone] Relation: Daughter  Advanced Directive information  FULL CODE  Chief Complaint  Patient presents with  . Readmit To SNF    HPI:  81 yo male seen today following hospital stay for accute respiratory failure with hypoxia, acute on chronic combined HF, thrombocytopenia, hx myeloproliferative d/o, FTT, severe malnutrition, CKD3. O2 sats 85% in hospital on RA. CXR eevealed b/l effusions and sputum cx (+) gram pos cocci. 2D echo revealed EF 25-30% with diffuse hypokinesis. He was given 1 unit platelets for count of 9K--->27K at d/c. BNP 1199.he was placed on bactrim and will need to complete 5 days after d/c.   He has no c/o today. No nursing issues. No fall since readmission into SNF. He is a poor historian due to dementia. Hx obtained from chart  Dementia - stable. He does not take any meds.  HTN/CHF - BP stable. He takes coreg, lasix and Potassium supp. He has xopenex nebs prn. He takes ASA daily  Gout - stable. No attacks. He takes uloric daily  Hypothyroid - stable on levothyroxine  Myeloproliferative d/o - stable. He takes no meds  arthritis - joint pain stable on tylenol prn  Past Medical History  Diagnosis Date  . Anemia   . Hypothyroidism   . Hypertension   . Myeloproliferative disorder ([place]) 06/30/2012  . Paroxysmal atrial flutter ([place]) 2014  . Chronic combined systolic and diastolic CHF (congestive heart failure) ([place])     a. EF in 2014 55-60% b. EF in  08/2015 25-30%  . Gout   . BPH (benign prostatic hypertrophy)   . COPD (chronic obstructive pulmonary disease) ([place])   . Bone marrow disorder   . CVA (cerebral vascular accident) ([place])   . Type II diabetes mellitus ([place])   . History of blood transfusion "several"  . Dementia   . Arthritis     "mostly in joints" (09/07/2015)  . Use of cane as ambulatory aid     Past Surgical History  Procedure Laterality Date  . Appendectomy    . Thyroid surgery    . Cyst removal neck  2009    Patient Care Team: Foye Spurling, MD as PCP - General (Endocrinology)  Social History   Social History  . Marital Status: Widowed    Spouse Name: N/A  . Number of Children: N/A  . Years of Education: N/A   Occupational History  . Not on file.   Social History Main Topics  . Smoking status: Former Smoker    Types: Cigarettes, Cigars  . Smokeless tobacco: Current User    Types: Chew     Comment: 09/07/2015 "don't know of any cigarettes/cigar smoking"  . Alcohol Use: No  . Drug Use: No  . Sexual Activity: No   Other Topics Concern  . Not on file   Social History Narrative   Lives in a house with his daughter.  Does not use a cane or walker.   He drives (but is not supposed to).  reports that he has quit smoking. His smoking use included Cigarettes and Cigars. His smokeless tobacco use includes Chew. He reports that he does not drink alcohol or use illicit drugs.  Family History  Problem Relation Age of Onset  . Heart disease Neg Hx   . Heart failure Neg Hx   . Prostate cancer     No family status information on file.     There is no immunization history on file for this patient.  No Known Allergies  Medications: Patient's Medications  New Prescriptions   No medications on file  Previous Medications   ACETAMINOPHEN (TYLENOL) 500 MG TABLET    Take 500 mg by mouth every 6 (six) hours as needed for mild pain.   ASPIRIN 81 MG TABLET    Take 1 tablet (81 mg  total) by mouth every other day.   CARVEDILOL (COREG) 25 MG TABLET    Take 25 mg by mouth 2 (two) times daily with a meal.    FUROSEMIDE (LASIX) 40 MG TABLET    Take 1 tablet (40 mg total) by mouth daily.   LEVALBUTEROL (XOPENEX) 0.63 MG/3ML NEBULIZER SOLUTION    Take 3 mLs (0.63 mg total) by nebulization every 6 (six) hours as needed for wheezing or shortness of breath.   LEVOTHYROXINE (SYNTHROID, LEVOTHROID) 88 MCG TABLET    Take 88 mcg by mouth daily before breakfast.    MULTIPLE VITAMIN (MULTIVITAMIN WITH MINERALS) TABS    Take 1 tablet by mouth daily.   POTASSIUM CHLORIDE SA (K-DUR,KLOR-CON) 20 MEQ TABLET    Take 1 tablet (20 mEq total) by mouth daily.   SULFAMETHOXAZOLE-TRIMETHOPRIM (BACTRIM DS,SEPTRA DS) 800-160 MG TABLET    Take 1 tablet by mouth 2 (two) times daily.   ULORIC 80 MG TABS    Take 1 tablet by mouth daily.   Modified Medications   No medications on file  Discontinued Medications   No medications on file    Review of Systems  Unable to perform ROS: Dementia    Filed Vitals:   09/22/15 1429  BP: 108/83  Pulse: 59  Temp: 98 F (36.7 C)  Weight: 191 lb 3.2 oz (86.728 kg)   Body mass index is 29.08 kg/(m^2).  Physical Exam  Constitutional: He appears well-developed.  Frail appearing sitting in w/c in NAD  HENT:  Mouth/Throat: No oropharyngeal exudate.  MM dry  Eyes: Pupils are equal, round, and reactive to light. No scleral icterus.  Neck: Neck supple. Carotid bruit is not present. No thyromegaly present.  Cardiovascular: Normal rate, regular rhythm and intact distal pulses.  Exam reveals no gallop and no friction rub.   Murmur (1/6 SEM) heard. Trace LE edema b/l. No calf TTP  Pulmonary/Chest: Effort normal and breath sounds normal. He has no wheezes. He has no rales. He exhibits no tenderness.  Abdominal: Soft. Bowel sounds are normal. He exhibits no distension, no abdominal bruit, no pulsatile midline mass and no mass. There is no tenderness. There is no  rebound and no guarding.  Musculoskeletal: He exhibits edema.  Lymphadenopathy:    He has no cervical adenopathy.  Neurological: He is alert.  Skin: Skin is warm and dry. No rash noted.  Psychiatric: He has a normal mood and affect. His behavior is normal.     Labs reviewed: Admission on 09/17/2015, Discharged on 09/21/2015  Component Date Value Ref Range Status  . Sodium 09/17/2015 144  135 - 145 mmol/L Final  . Potassium 09/17/2015 4.2  3.5 - 5.1  mmol/L Final  . Chloride 09/17/2015 108  101 - 111 mmol/L Final  . CO2 09/17/2015 30  22 - 32 mmol/L Final  . Glucose, Bld 09/17/2015 90  65 - 99 mg/dL Final  . BUN 09/17/2015 54* 6 - 20 mg/dL Final  . Creatinine, Ser 09/17/2015 1.65* 0.61 - 1.24 mg/dL Final  . Calcium 09/17/2015 8.4* 8.9 - 10.3 mg/dL Final  . GFR calc non Af Amer 09/17/2015 36* >60 mL/min Final  . GFR calc Af Amer 09/17/2015 42* >60 mL/min Final   Comment: (NOTE) The eGFR has been calculated using the CKD EPI equation. This calculation has not been validated in all clinical situations. eGFR's persistently <60 mL/min signify possible Chronic Kidney Disease.   . Anion gap 09/17/2015 6  5 - 15 Final  . B Natriuretic Peptide 09/17/2015 1199.9* 0.0 - 100.0 pg/mL Final  . Troponin I 09/17/2015 <0.03  <0.031 ng/mL Final   Comment:        NO INDICATION OF MYOCARDIAL INJURY.   . WBC 09/17/2015 9.7  4.0 - 10.5 K/uL Final  . RBC 09/17/2015 5.93* 4.22 - 5.81 MIL/uL Final  . Hemoglobin 09/17/2015 14.8  13.0 - 17.0 g/dL Final  . HCT 09/17/2015 53.3* 39.0 - 52.0 % Final  . MCV 09/17/2015 89.9  78.0 - 100.0 fL Final  . MCH 09/17/2015 25.0* 26.0 - 34.0 pg Final  . MCHC 09/17/2015 27.8* 30.0 - 36.0 g/dL Final  . RDW 09/17/2015 25.6* 11.5 - 15.5 % Final  . Platelets 09/17/2015 9* 150 - 400 K/uL Final   Comment: RESULT REPEATED AND VERIFIED CRITICAL RESULT CALLED TO, READ BACK BY AND VERIFIED WITH: O.NGIO AT 1537 ON 09/17/15 BY S.VANHOORNE   . Neutrophils Relative %  09/17/2015 67   Final  . Neutro Abs 09/17/2015 6.5  1.7 - 7.7 K/uL Final  . Lymphocytes Relative 09/17/2015 16   Final  . Lymphs Abs 09/17/2015 1.5  0.7 - 4.0 K/uL Final  . Monocytes Relative 09/17/2015 17   Final  . Monocytes Absolute 09/17/2015 1.6* 0.1 - 1.0 K/uL Final  . Eosinophils Relative 09/17/2015 0   Final  . Eosinophils Absolute 09/17/2015 0.0  0.0 - 0.7 K/uL Final  . Basophils Relative 09/17/2015 1   Final  . Basophils Absolute 09/17/2015 0.1  0.0 - 0.1 K/uL Final  . RBC Morphology 09/17/2015 RARE NRBCs   Final  . Smear Review 09/17/2015 GIANT PLATELETS SEEN   Final   PLATELET COUNT CONFIRMED BY SMEAR  . Color, Urine 09/17/2015 YELLOW  YELLOW Final  . APPearance 09/17/2015 CLEAR  CLEAR Final  . Specific Gravity, Urine 09/17/2015 1.014  1.005 - 1.030 Final  . pH 09/17/2015 5.5  5.0 - 8.0 Final  . Glucose, UA 09/17/2015 NEGATIVE  NEGATIVE mg/dL Final  . Hgb urine dipstick 09/17/2015 TRACE* NEGATIVE Final  . Bilirubin Urine 09/17/2015 NEGATIVE  NEGATIVE Final  . Ketones, ur 09/17/2015 NEGATIVE  NEGATIVE mg/dL Final  . Protein, ur 09/17/2015 30* NEGATIVE mg/dL Final  . Urobilinogen, UA 09/17/2015 0.2  0.0 - 1.0 mg/dL Final  . Nitrite 09/17/2015 NEGATIVE  NEGATIVE Final  . Leukocytes, UA 09/17/2015 NEGATIVE  NEGATIVE Final  . Specimen Description 09/17/2015 URINE, RANDOM   Final  . Special Requests 09/17/2015 NONE   Final  . Culture 09/17/2015    Final                   Value:MULTIPLE SPECIES PRESENT, SUGGEST RECOLLECTION Performed at [facility]   . Report Status  09/17/2015 09/19/2015 FINAL   Final  . WBC, UA 09/17/2015 0-2  <3 WBC/hpf Final  . RBC / HPF 09/17/2015 0-2  <3 RBC/hpf Final  . Bacteria, UA 09/17/2015 RARE  RARE Final  . Casts 09/17/2015 HYALINE CASTS* NEGATIVE Final  . WBC 09/17/2015 8.9  4.0 - 10.5 K/uL Final  . RBC 09/17/2015 5.54  4.22 - 5.81 MIL/uL Final  . Hemoglobin 09/17/2015 13.8  13.0 - 17.0 g/dL Final  . HCT 09/17/2015 49.2  39.0 - 52.0 %  Final  . MCV 09/17/2015 88.8  78.0 - 100.0 fL Final  . MCH 09/17/2015 24.9* 26.0 - 34.0 pg Final  . MCHC 09/17/2015 28.0* 30.0 - 36.0 g/dL Final  . RDW 09/17/2015 25.6* 11.5 - 15.5 % Final  . Platelets 09/17/2015 7* 150 - 400 K/uL Final   Comment: RESULT REPEATED AND VERIFIED SPECIMEN CHECKED FOR CLOTS PLATELET COUNT CONFIRMED BY SMEAR CRITICAL RESULT CALLED TO, READ BACK BY AND VERIFIED WITH: BENJAMIN,T AT 2010 ON [id number] BY HOOKER,B   . Sodium 09/18/2015 144  135 - 145 mmol/L Final  . Potassium 09/18/2015 4.1  3.5 - 5.1 mmol/L Final  . Chloride 09/18/2015 107  101 - 111 mmol/L Final  . CO2 09/18/2015 28  22 - 32 mmol/L Final  . Glucose, Bld 09/18/2015 79  65 - 99 mg/dL Final  . BUN 09/18/2015 48* 6 - 20 mg/dL Final  . Creatinine, Ser 09/18/2015 1.51* 0.61 - 1.24 mg/dL Final  . Calcium 09/18/2015 8.2* 8.9 - 10.3 mg/dL Final  . GFR calc non Af Amer 09/18/2015 40* >60 mL/min Final  . GFR calc Af Amer 09/18/2015 47* >60 mL/min Final   Comment: (NOTE) The eGFR has been calculated using the CKD EPI equation. This calculation has not been validated in all clinical situations. eGFR's persistently <60 mL/min signify possible Chronic Kidney Disease.   . Anion gap 09/18/2015 9  5 - 15 Final  . WBC 09/18/2015 9.5  4.0 - 10.5 K/uL Final  . RBC 09/18/2015 5.56  4.22 - 5.81 MIL/uL Final  . Hemoglobin 09/18/2015 13.9  13.0 - 17.0 g/dL Final  . HCT 09/18/2015 50.1  39.0 - 52.0 % Final  . MCV 09/18/2015 90.1  78.0 - 100.0 fL Final  . MCH 09/18/2015 25.0* 26.0 - 34.0 pg Final  . MCHC 09/18/2015 27.7* 30.0 - 36.0 g/dL Final  . RDW 09/18/2015 25.6* 11.5 - 15.5 % Final  . Platelets 09/18/2015 16* 150 - 400 K/uL Final   Comment: REPEATED TO VERIFY CRITICAL VALUE NOTED.  VALUE IS CONSISTENT WITH PREVIOUSLY REPORTED AND CALLED VALUE. PLATELET COUNT CONFIRMED BY SMEAR   . ABO/RH(D) 09/17/2015 O POS   Final  . Antibody Screen 09/17/2015 NEG   Final  . Sample Expiration 09/17/2015 09/20/2015   Final   . Unit Number 09/17/2015 [id number]   Final  . Blood Component Type 09/17/2015 PLTP LR2 PAS   Final  . Unit division 09/17/2015 00   Final  . Status of Unit 09/17/2015 ISSUED,FINAL   Final  . Transfusion Status 09/17/2015 OK TO TRANSFUSE   Final  . Unit Number 09/18/2015 [id number]   Final  . Blood Component Type 09/18/2015 PLTPHER LR1   Final  . Unit division 09/18/2015 00   Final  . Status of Unit 09/18/2015 ISSUED,FINAL   Final  . Transfusion Status 09/18/2015 OK TO TRANSFUSE   Final  . LD, Fluid 09/18/2015 328* 3 - 23 U/L Final   Comment: (NOTE) Results should be evaluated in conjunction with  serum values Performed at [facility]   . Fluid Type-FLDH 09/18/2015 PLEURAL   Corrected   Comment: RIGHT CORRECTED ON 10/30 AT 1151: PREVIOUSLY REPORTED AS Pleural R   . Total protein, fluid 09/18/2015 <3.0   Final   Comment: (NOTE) No normal range established for this test Results should be evaluated in conjunction with serum values Performed at [facility]   . Fluid Type-FTP 09/18/2015 PLEURAL   Corrected   Comment: RIGHT CORRECTED ON 10/30 AT 1151: PREVIOUSLY REPORTED AS Pleural R   . Fluid Type-FCT 09/18/2015 PLEURAL   Corrected   Comment: RIGHT CORRECTED ON 10/30 AT 1151: PREVIOUSLY REPORTED AS Pleural R   . Color, Fluid 09/18/2015 RED* YELLOW Final  . Appearance, Fluid 09/18/2015 CLOUDY* CLEAR Final  . WBC, Fluid 09/18/2015 1646* 0 - 1000 cu mm Final  . Neutrophil Count, Fluid 09/18/2015 10  0 - 25 % Final  . Lymphs, Fluid 09/18/2015 82   Final  . Monocyte-Macrophage-Serous Fluid 09/18/2015 7* 50 - 90 % Final  . Eos, Fluid 09/18/2015 1   Final  . Other Cells, Fluid 09/18/2015 NONE   Final  . Glucose, Fluid 09/18/2015 38   Final   Comment: (NOTE) No normal range established for this test Results should be evaluated in conjunction with serum values Performed at [facility]   . Fluid Type-FGLU 09/18/2015 PLEURAL   Corrected   Comment:  RIGHT CORRECTED ON 10/30 AT 1152: PREVIOUSLY REPORTED AS Pleural R   . pH, Body Fluid 09/18/2015 7.5  Not Estab. Final   Comment: (NOTE) This test was developed and its performance characteristics determined by [facility]. It has not been cleared or approved by the Food and Drug Administration. Performed At: [facility] [place], [place] [id number] Lindon Romp MD ID:[id number]   . Source of Sample 09/18/2015 PLEURAL   Final   RIGHT  . Specimen Description 09/18/2015 FLUID RIGHT PLEURAL   Final  . Special Requests 09/18/2015 NONE   Final  . Gram Stain 09/18/2015    Final                   Value:GRAM POSITIVE COCCI IN CLUSTERS AEROBIC BOTTLE ONLY CRITICAL RESULT CALLED TO, READ BACK BY AND VERIFIED WITH: A JOHNSON,RN AT 1441 09/19/15 BY L BENFIELD   . Culture 09/18/2015    Final                   Value:STAPHYLOCOCCUS SPECIES (COAGULASE NEGATIVE) Performed at [facility]   . Report Status 09/18/2015 09/22/2015 FINAL   Final  . Organism ID, Bacteria 09/18/2015 STAPHYLOCOCCUS SPECIES (COAGULASE NEGATIVE)   Final  . Specimen Description 09/18/2015 FLUID RIGHT PLEURAL   Final  . Special Requests 09/18/2015 NONE   Final  . Gram Stain 09/18/2015    Final                   Value:WBC PRESENT,BOTH PMN AND MONONUCLEAR NO ORGANISMS SEEN CONFIRMEDC BY K.MATILAINEN Performed at [facility]   . Report Status 09/18/2015 09/18/2015 FINAL   Final  . WBC 09/19/2015 7.3  4.0 - 10.5 K/uL Final  . RBC 09/19/2015 4.83  4.22 - 5.81 MIL/uL Final  . Hemoglobin 09/19/2015 12.0* 13.0 - 17.0 g/dL Final  . HCT 09/19/2015 43.1  39.0 - 52.0 % Final  . MCV 09/19/2015 89.2  78.0 - 100.0 fL Final  . MCH 09/19/2015 24.8* 26.0 - 34.0 pg Final  . MCHC 09/19/2015  27.8* 30.0 - 36.0 g/dL Final  . RDW 09/19/2015 25.4* 11.5 - 15.5 % Final  . Platelets 09/19/2015 37* 150 - 400 K/uL Final   POST TRANSFUSION SPECIMEN  . Sodium 09/19/2015 142  135 - 145 mmol/L Final  .  Potassium 09/19/2015 4.0  3.5 - 5.1 mmol/L Final  . Chloride 09/19/2015 106  101 - 111 mmol/L Final  . CO2 09/19/2015 30  22 - 32 mmol/L Final  . Glucose, Bld 09/19/2015 137* 65 - 99 mg/dL Final  . BUN 09/19/2015 48* 6 - 20 mg/dL Final  . Creatinine, Ser 09/19/2015 1.70* 0.61 - 1.24 mg/dL Final  . Calcium 09/19/2015 8.1* 8.9 - 10.3 mg/dL Final  . GFR calc non Af Amer 09/19/2015 35* >60 mL/min Final  . GFR calc Af Amer 09/19/2015 41* >60 mL/min Final   Comment: (NOTE) The eGFR has been calculated using the CKD EPI equation. This calculation has not been validated in all clinical situations. eGFR's persistently <60 mL/min signify possible Chronic Kidney Disease.   . Anion gap 09/19/2015 6  5 - 15 Final  . WBC 09/20/2015 8.0  4.0 - 10.5 K/uL Final   Comment: WHITE COUNT CONFIRMED ON SMEAR RARE NRBCs   . RBC 09/20/2015 5.30  4.22 - 5.81 MIL/uL Final  . Hemoglobin 09/20/2015 13.0  13.0 - 17.0 g/dL Final  . HCT 09/20/2015 47.8  39.0 - 52.0 % Final  . MCV 09/20/2015 90.2  78.0 - 100.0 fL Final  . MCH 09/20/2015 24.5* 26.0 - 34.0 pg Final  . MCHC 09/20/2015 27.2* 30.0 - 36.0 g/dL Final  . RDW 09/20/2015 25.6* 11.5 - 15.5 % Final  . Platelets 09/20/2015 27* 150 - 400 K/uL Final   Comment: REPEATED TO VERIFY SPECIMEN CHECKED FOR CLOTS CONSISTENT WITH PREVIOUS RESULT CRITICAL RESULT CALLED TO, READ BACK BY AND VERIFIED WITH: L LIN RN 9024 09/20/15 A [place] PLATELET COUNT CONFIRMED BY SMEAR   . Sodium 09/20/2015 143  135 - 145 mmol/L Final  . Potassium 09/20/2015 4.3  3.5 - 5.1 mmol/L Final  . Chloride 09/20/2015 105  101 - 111 mmol/L Final  . CO2 09/20/2015 29  22 - 32 mmol/L Final  . Glucose, Bld 09/20/2015 91  65 - 99 mg/dL Final  . BUN 09/20/2015 42* 6 - 20 mg/dL Final  . Creatinine, Ser 09/20/2015 1.53* 0.61 - 1.24 mg/dL Final  . Calcium 09/20/2015 8.2* 8.9 - 10.3 mg/dL Final  . GFR calc non Af Amer 09/20/2015 40* >60 mL/min Final  . GFR calc Af Amer 09/20/2015 46* >60 mL/min  Final   Comment: (NOTE) The eGFR has been calculated using the CKD EPI equation. This calculation has not been validated in all clinical situations. eGFR's persistently <60 mL/min signify possible Chronic Kidney Disease.   . Anion gap 09/20/2015 9  5 - 15 Final  . Unit Number 09/20/2015 [id number]   Final  . Blood Component Type 09/20/2015 PLTP LR1 PAS   Final  . Unit division 09/20/2015 00   Final  . Status of Unit 09/20/2015 ISSUED,FINAL   Final  . Transfusion Status 09/20/2015 OK TO TRANSFUSE   Final  . WBC 09/21/2015 7.4  4.0 - 10.5 K/uL Final   Comment: WHITE COUNT CONFIRMED ON SMEAR RARE NRBCs   . RBC 09/21/2015 5.46  4.22 - 5.81 MIL/uL Final  . Hemoglobin 09/21/2015 13.6  13.0 - 17.0 g/dL Final  . HCT 09/21/2015 48.9  39.0 - 52.0 % Final  . MCV 09/21/2015 89.6  78.0 - 100.0  fL Final  . MCH 09/21/2015 24.9* 26.0 - 34.0 pg Final  . MCHC 09/21/2015 27.8* 30.0 - 36.0 g/dL Final  . RDW 09/21/2015 25.4* 11.5 - 15.5 % Final  . Platelets 09/21/2015 10* 150 - 400 K/uL Final   Comment: REPEATED TO VERIFY CRITICAL VALUE NOTED.  VALUE IS CONSISTENT WITH PREVIOUSLY REPORTED AND CALLED VALUE. PLATELET COUNT CONFIRMED BY SMEAR SPECIMEN CHECKED FOR CLOTS CONSISTENT WITH PREVIOUS RESULT   . Unit Number 09/21/2015 [id number]   Final  . Blood Component Type 09/21/2015 PLTP LR1 PAS   Final  . Unit division 09/21/2015 00   Final  . Status of Unit 09/21/2015 ISSUED,FINAL   Final  . Transfusion Status 09/21/2015 OK TO TRANSFUSE   Final  Admission on 09/07/2015, Discharged on 09/16/2015  No results displayed because visit has over 200 results.    Office Visit on 09/02/2015  Component Date Value Ref Range Status  . Sodium 09/02/2015 137  135 - 146 mmol/L Final  . Potassium 09/02/2015 4.3  3.5 - 5.3 mmol/L Final  . Chloride 09/02/2015 100  98 - 110 mmol/L Final  . CO2 09/02/2015 22  20 - 31 mmol/L Final  . Glucose, Bld 09/02/2015 90  65 - 99 mg/dL Final  . BUN 09/02/2015 22  7 - 25  mg/dL Final  . Creat 09/02/2015 1.30* 0.70 - 1.11 mg/dL Final  . Calcium 09/02/2015 8.6  8.6 - 10.3 mg/dL Final  Appointment on 08/25/2015  Component Date Value Ref Range Status  . Hold Tube, Blood Bank 08/25/2015 Blood Bank Order Cancelled   Final  . WBC 08/25/2015 6.6  4.0 - 10.3 10e3/uL Final  . NEUT# 08/25/2015 4.8  1.5 - 6.5 10e3/uL Final  . HGB 08/25/2015 12.9* 13.0 - 17.1 g/dL Final  . HCT 08/25/2015 45.7  38.4 - 49.9 % Final  . Platelets 08/25/2015 84* 140 - 400 10e3/uL Final  . MCV 08/25/2015 89.4  79.3 - 98.0 fL Final  . MCH 08/25/2015 25.2* 27.2 - 33.4 pg Final  . MCHC 08/25/2015 28.2* 32.0 - 36.0 g/dL Final  . RBC 08/25/2015 5.11  4.20 - 5.82 10e6/uL Final  . RDW 08/25/2015 27.9* 11.0 - 14.6 % Final  . lymph# 08/25/2015 1.0  0.9 - 3.3 10e3/uL Final  . MONO# 08/25/2015 0.6  0.1 - 0.9 10e3/uL Final  . Eosinophils Absolute 08/25/2015 0.1  0.0 - 0.5 10e3/uL Final  . Basophils Absolute 08/25/2015 0.1  0.0 - 0.1 10e3/uL Final  . NEUT% 08/25/2015 72.7  39.0 - 75.0 % Final  . LYMPH% 08/25/2015 15.0  14.0 - 49.0 % Final  . MONO% 08/25/2015 9.3  0.0 - 14.0 % Final  . EOS% 08/25/2015 1.5  0.0 - 7.0 % Final  . BASO% 08/25/2015 1.5  0.0 - 2.0 % Final  . nRBC 08/25/2015 9* 0 - 0 % Final  . Technologist Review 08/25/2015 5% Blasts   Final  Appointment on 08/11/2015  Component Date Value Ref Range Status  . Hold Tube, Blood Bank 08/11/2015 Blood Bank Order Cancelled   Final  . WBC 08/11/2015 7.0  4.0 - 10.3 10e3/uL Final  . HGB 08/11/2015 11.8* 13.0 - 17.1 g/dL Final  . HCT 08/11/2015 42.2  38.4 - 49.9 % Final  . Platelets 08/11/2015 124* 140 - 400 10e3/uL Final  . MCV 08/11/2015 89.0  79.3 - 98.0 fL Final  . MCH 08/11/2015 24.9* 27.2 - 33.4 pg Final  . MCHC 08/11/2015 28.0* 32.0 - 36.0 g/dL Final  . RBC 08/11/2015 4.74  4.20 - 5.82 10e6/uL Final  . RDW 08/11/2015 28.5* 11.0 - 14.6 % Final  . ANC ([facility] manual diff) 08/11/2015 5.1  1.5 - 6.5 10e3/uL Final  . ALC 08/11/2015 0.9  0.9  - 3.3 10e3/uL Final  . SEG 08/11/2015 72  38 - 77 % Final  . Band Neutrophils 08/11/2015 0  0 - 10 % Final  . LYMPH 08/11/2015 13* 14 - 49 % Final  . MONO 08/11/2015 5  0 - 14 % Final  . EOS 08/11/2015 1  0 - 7 % Final  . Basophil 08/11/2015 6* 0 - 2 % Final  . Metamyelocytes 08/11/2015 0  0 - 0 % Final  . Myelocytes 08/11/2015 1* 0 - 0 % Final  . PROMYELO 08/11/2015 0  0 - 0 % Final  . Blasts 08/11/2015 2* 0 - 0 % Final  . Variant Lymph 08/11/2015 0  0 - 0 % Final  . Other Cell 08/11/2015 0  0 - 0 % Final  . nRBC 08/11/2015 4* 0 - 0 % Final  . Polychromasia 08/11/2015 Slight  Slight Final  . Tear Drop Cells 08/11/2015 Moderate  Negative Final  . Ovalocytes 08/11/2015 Moderate  Negative Final  . Shistocytes 08/11/2015 Few  Negative Final  . PLT EST 08/11/2015 Decreased  Adequate Final  . Platelet Morphology 08/11/2015 Large and giant platelets  Within Normal Limits Final  Appointment on 07/28/2015  Component Date Value Ref Range Status  . Hold Tube, Blood Bank 07/28/2015 Blood Bank Order Cancelled   Final  . WBC 07/28/2015 5.7  4.0 - 10.3 10e3/uL Final  . NEUT# 07/28/2015 4.4  1.5 - 6.5 10e3/uL Final  . HGB 07/28/2015 11.1* 13.0 - 17.1 g/dL Final  . HCT 07/28/2015 39.3  38.4 - 49.9 % Final  . Platelets 07/28/2015 136* 140 - 400 10e3/uL Final  . MCV 07/28/2015 88.9  79.3 - 98.0 fL Final  . MCH 07/28/2015 25.1* 27.2 - 33.4 pg Final  . MCHC 07/28/2015 28.2* 32.0 - 36.0 g/dL Final  . RBC 07/28/2015 4.42  4.20 - 5.82 10e6/uL Final  . RDW 07/28/2015 28.9* 11.0 - 14.6 % Final  . lymph# 07/28/2015 0.7* 0.9 - 3.3 10e3/uL Final  . MONO# 07/28/2015 0.4  0.1 - 0.9 10e3/uL Final  . Eosinophils Absolute 07/28/2015 0.1  0.0 - 0.5 10e3/uL Final  . Basophils Absolute 07/28/2015 0.1  0.0 - 0.1 10e3/uL Final  . NEUT% 07/28/2015 77.0* 39.0 - 75.0 % Final  . LYMPH% 07/28/2015 12.7* 14.0 - 49.0 % Final  . MONO% 07/28/2015 7.5  0.0 - 14.0 % Final  . EOS% 07/28/2015 1.6  0.0 - 7.0 % Final  . BASO%  07/28/2015 1.2  0.0 - 2.0 % Final  . nRBC 07/28/2015 3* 0 - 0 % Final  . Technologist Review 07/28/2015 2% Blasts   Final  Appointment on 07/14/2015  Component Date Value Ref Range Status  . Hold Tube, Blood Bank 07/14/2015 Blood Bank Order Cancelled   Final  . WBC 07/14/2015 5.4  4.0 - 10.3 10e3/uL Final  . NEUT# 07/14/2015 4.1  1.5 - 6.5 10e3/uL Final  . HGB 07/14/2015 11.4* 13.0 - 17.1 g/dL Final  . HCT 07/14/2015 39.9  38.4 - 49.9 % Final  . Platelets 07/14/2015 167 Giant platelets present  140 - 400 10e3/uL Final  . MCV 07/14/2015 89.9  79.3 - 98.0 fL Final  . MCH 07/14/2015 25.7* 27.2 - 33.4 pg Final  . MCHC 07/14/2015 28.6* 32.0 - 36.0 g/dL  Final  . RBC 07/14/2015 4.44  4.20 - 5.82 10e6/uL Final  . RDW 07/14/2015 28.7* 11.0 - 14.6 % Final  . lymph# 07/14/2015 0.8* 0.9 - 3.3 10e3/uL Final  . MONO# 07/14/2015 0.3  0.1 - 0.9 10e3/uL Final  . Eosinophils Absolute 07/14/2015 0.1  0.0 - 0.5 10e3/uL Final  . Basophils Absolute 07/14/2015 0.1  0.0 - 0.1 10e3/uL Final  . NEUT% 07/14/2015 76.3* 39.0 - 75.0 % Final  . LYMPH% 07/14/2015 14.9  14.0 - 49.0 % Final  . MONO% 07/14/2015 6.2  0.0 - 14.0 % Final  . EOS% 07/14/2015 1.7  0.0 - 7.0 % Final  . BASO% 07/14/2015 0.9  0.0 - 2.0 % Final  . nRBC 07/14/2015 2* 0 - 0 % Final  . Technologist Review 07/14/2015 2% blasts, mkd poik   Final  Appointment on 06/30/2015  Component Date Value Ref Range Status  . Hold Tube, Blood Bank 06/30/2015 Blood Bank Order Cancelled   Final  . WBC 06/30/2015 5.3  4.0 - 10.3 10e3/uL Final  . NEUT# 06/30/2015 3.7  1.5 - 6.5 10e3/uL Final  . HGB 06/30/2015 11.3* 13.0 - 17.1 g/dL Final  . HCT 06/30/2015 39.4  38.4 - 49.9 % Final  . Platelets 06/30/2015 134* 140 - 400 10e3/uL Final  . MCV 06/30/2015 91.8  79.3 - 98.0 fL Final  . MCH 06/30/2015 26.3* 27.2 - 33.4 pg Final  . MCHC 06/30/2015 28.7* 32.0 - 36.0 g/dL Final  . RBC 06/30/2015 4.29  4.20 - 5.82 10e6/uL Final  . RDW 06/30/2015 29.2* 11.0 - 14.6 % Final   . lymph# 06/30/2015 0.9  0.9 - 3.3 10e3/uL Final  . MONO# 06/30/2015 0.4  0.1 - 0.9 10e3/uL Final  . Eosinophils Absolute 06/30/2015 0.1  0.0 - 0.5 10e3/uL Final  . Basophils Absolute 06/30/2015 0.1  0.0 - 0.1 10e3/uL Final  . NEUT% 06/30/2015 70.5  39.0 - 75.0 % Final  . LYMPH% 06/30/2015 17.6  14.0 - 49.0 % Final  . MONO% 06/30/2015 8.3  0.0 - 14.0 % Final  . EOS% 06/30/2015 2.7  0.0 - 7.0 % Final  . BASO% 06/30/2015 0.9  0.0 - 2.0 % Final  . nRBC 06/30/2015 2* 0 - 0 % Final  . Technologist Review 06/30/2015 4% blasts, mk poik with teardrops, ovalocytes, fragments and helmets   Final    Dg Chest 1 View  09/18/2015  CLINICAL DATA:  Status post thoracentesis. EXAM: CHEST 1 VIEW COMPARISON:  Chest x-ray dated 09/17/2015. FINDINGS: Cardiomediastinal silhouette is stable in size and configuration. Cardiomegaly again noted. Decreased size of the right pleural effusion status post thoracentesis. Persistent opacity at the right lung base is likely residual atelectasis. Probable small left pleural effusion with adjacent atelectasis, unchanged. Mild interstitial edema bilaterally, left greater than right, is unchanged. IMPRESSION: 1. Decreased size of the right pleural effusion status post thoracentesis. No pneumothorax or other postprocedural complicating feature seen. 2. Cardiomegaly with central pulmonary vascular congestion and persistent mild bilateral interstitial edema suggesting continued mild volume overload/CHF. 3. Probable small left pleural effusion with adjacent atelectasis, unchanged. Electronically Signed   By: Franki Cabot M.D.   On: 09/18/2015 11:41   Dg Chest 2 View  09/17/2015  CLINICAL DATA:  Pt and family report that pt was admitted for SOB and fluid on the lungs from 10/19-10/28, pt started having SOB Friday night and continues today. Pt denies pain. Family reports legs are swollen and belly is distended. H/o CHF EXAM: CHEST - 2 VIEW  COMPARISON:  09/11/2015 FINDINGS: Stable  cardiomegaly. Central pulmonary vascular congestion with some improvement in the interstitial edema seen previously. Moderate right and tiny left pleural effusions. Atelectasis/ consolidation in the lung bases right greater than left. Atheromatous aorta. Visualized skeletal structures are unremarkable. IMPRESSION: 1. Partial improvement in the interstitial edema and vascular congestion, with persistent effusions and bibasilar atelectasis/consolidation, right greater than left. Electronically Signed   By: Lucrezia Europe M.D.   On: 09/17/2015 15:02   Dg Chest 2 View  09/11/2015  CLINICAL DATA:  Pleural effusion, CHF EXAM: CHEST  2 VIEW COMPARISON:  09/07/2015 FINDINGS: Cardiomegaly with mild interstitial edema. Moderate right and small left pleural effusions. No pneumothorax. No interval change. IMPRESSION: Right mid lobe mild interstitial edema. Moderate right and small left pleural effusions. Electronically Signed   By: Julian Hy M.D.   On: 09/11/2015 09:58   Dg Chest 2 View  09/07/2015  CLINICAL DATA:  Weakness. EXAM: CHEST  2 VIEW COMPARISON:  01/04/2013 FINDINGS: Diffuse bilateral airspace opacities are noted. Moderate right effusion with more confluent opacity at the right lung base. No definite effusion on the left. Heart is mildly enlarged. No acute bony abnormality. IMPRESSION: Diffuse bilateral airspace disease, likely edema. Moderate right effusion with focal right lower lobe opacity which could also reflect edema or infection. Electronically Signed   By: Rolm Baptise M.D.   On: 09/07/2015 11:09   Ct Chest Wo Contrast  09/17/2015  CLINICAL DATA:  Shortness of breath, lower extremity swelling, abdominal distention. EXAM: CT CHEST WITHOUT CONTRAST TECHNIQUE: Multidetector CT imaging of the chest was performed following the standard protocol without IV contrast. COMPARISON:  None. Chest CT dated 09/17/2015 FINDINGS: Mediastinum/Lymph Nodes: No masses or pathologically enlarged lymph nodes  identified on this un-enhanced exam. The heart is markedly enlarged. There is no evidence of significant pericardial effusion. Atherosclerotic calcifications are noted of the coronary arteries, aorta and aortic valve annulus. The aorta is torturous without evidence of aneurysmal dilation. Lungs/Pleura: There are moderate in size bilateral pleural effusions with subsegmental atelectasis. There is also mild interstitial and alveolar pulmonary edema. No evidence of focal airspace consolidation. Upper abdomen: No acute findings. Musculoskeletal: No chest wall mass or suspicious bone lesions identified. IMPRESSION: Markedly enlarged heart with atherosclerotic disease of the coronary arteries and aorta. Calcifications of the aortic valve annulus. Moderate in size bilateral pleural effusions, and mild pulmonary edema. Electronically Signed   By: Fidela Salisbury M.D.   On: 09/17/2015 17:21   US Renal  09/09/2015  CLINICAL DATA:  Acute renal failure. EXAM: RENAL / URINARY TRACT ULTRASOUND COMPLETE COMPARISON:  None. FINDINGS: Right Kidney: Length: 11.3 cm. Normal renal cortical thickness. Slight increased echogenicity suggesting medical renal disease. No hydronephrosis. Left Kidney: Length: 12.1 cm. Normal renal cortical thickness. Slight increased echogenicity suggesting medical renal disease. No hydronephrosis. Bladder: Poorly distended bladder with apparent wall thickening. Prostate gland enlargement. IMPRESSION: 1. Slight increased echogenicity of both kidneys suggesting medical renal disease. Normal renal cortical thickness and no hydronephrosis. 2. Poorly distended bladder with mild apparent wall thickening. 3. Prostate gland enlargement. Electronically Signed   By: Marijo Sanes M.D.   On: 09/09/2015 13:51   US Thoracentesis Asp Pleural Space W/img Guide  09/18/2015  CLINICAL DATA:  Congestive heart failure, bilateral pleural effusions. Request for diagnostic and therapeutic thoracentesis EXAM: ULTRASOUND  GUIDED RIGHT THORACENTESIS COMPARISON:  None. PROCEDURE: An ultrasound guided thoracentesis was thoroughly discussed with the patient and questions answered. The benefits, risks, alternatives and complications were also discussed. The  patient understands and wishes to proceed with the procedure. Written consent was obtained. Ultrasound was performed to localize and mark an adequate pocket of fluid in the right chest. The area was then prepped and draped in the normal sterile fashion. 1% Lidocaine was used for local anesthesia. Under ultrasound guidance a Safe T Centesis catheter was introduced. Thoracentesis was performed. The catheter was removed and a dressing applied. COMPLICATIONS: None immediate. FINDINGS: A total of approximately 830 mls of blood tinged fluid was removed. A fluid sample wassent for laboratory analysis. IMPRESSION: Successful ultrasound guided right thoracentesis yielding 830 mls of pleural fluid. Read by:  Gareth Eagle, PA-C Electronically Signed   By: Lucrezia Europe M.D.   On: 09/18/2015 11:10     Assessment/Plan  Cont current meds as ordered  Cont PT/OT/ST as indicated  Nutritional supplement as indicated  GOAL: short term rehab and d/c home when medically appropriate. Communicated with pt and nursing.  Will follow  Shelva Hetzer S. Perlie Gold  [facility] [address] [place], [place] [address] [phone] Cell (Monday-Friday 8 AM - 5 PM) [phone] After 5 PM and follow prompts

## 2015-09-23 ENCOUNTER — Encounter: Payer: Medicare Other | Admitting: Cardiology

## 2015-09-30 ENCOUNTER — Non-Acute Institutional Stay (SKILLED_NURSING_FACILITY): Payer: Medicare Other | Admitting: Nurse Practitioner

## 2015-09-30 DIAGNOSIS — D6489 Other specified anemias: Secondary | ICD-10-CM | POA: Diagnosis not present

## 2015-09-30 DIAGNOSIS — E43 Unspecified severe protein-calorie malnutrition: Secondary | ICD-10-CM | POA: Diagnosis not present

## 2015-09-30 DIAGNOSIS — J189 Pneumonia, unspecified organism: Secondary | ICD-10-CM | POA: Diagnosis not present

## 2015-09-30 DIAGNOSIS — N289 Disorder of kidney and ureter, unspecified: Secondary | ICD-10-CM | POA: Diagnosis not present

## 2015-09-30 DIAGNOSIS — I5043 Acute on chronic combined systolic (congestive) and diastolic (congestive) heart failure: Secondary | ICD-10-CM

## 2015-09-30 NOTE — Progress Notes (Signed)
Patient ID: Kyle Heath, male   DOB: Dec 04, 1926, 81 y.o.   MRN: [id number]    Nursing Home Location:  [place] of Service: SNF (31)  PCP: Foye Spurling, MD  No Known Allergies  Chief Complaint  Patient presents with  . Acute Visit    HPI:  Patient is a 81 y.o. male seen today at [facility] due to poor PO intake and weight loss. Pt with a history that includes diabetes, hypertension, myeloproliferative disorder, atrial flutter, chronic diastolic dysfunction, dementia who was recently hospitalized for CHF, persistent weakness, dyspnea at rest and with exertion. Pt now at [facility] for rehab. Staff notes poor PO intake. Eating at nursing station and does not complete meal. Family reports pt has been on appetite stimulant in the past. Pt with dementia and therefore a poor historian however denies trouble swallowing or pain. No noted constipation, diarrhea, nausea or vomiting by pt or staff.   Review of Systems:  Review of Systems  Unable to perform ROS: Dementia    Past Medical History  Diagnosis Date  . Anemia   . Hypothyroidism   . Hypertension   . Myeloproliferative disorder ([place]) 06/30/2012  . Paroxysmal atrial flutter ([facility]) 2014  . Chronic combined systolic and diastolic CHF (congestive heart failure) ([place])     a. EF in 2014 55-60% b. EF in 08/2015 25-30%  . Gout   . BPH (benign prostatic hypertrophy)   . COPD (chronic obstructive pulmonary disease) ([place])   . Bone marrow disorder   . CVA (cerebral vascular accident) ([place])   . Type II diabetes mellitus ([place])   . History of blood transfusion "several"  . Dementia   . Arthritis     "mostly in joints" (09/07/2015)  . Use of cane as ambulatory aid    Past Surgical History  Procedure Laterality Date  . Appendectomy    . Thyroid surgery    . Cyst removal neck  2009   Social History:   reports that he has quit smoking. His smoking use included Cigarettes and Cigars. His  smokeless tobacco use includes Chew. He reports that he does not drink alcohol or use illicit drugs.  Family History  Problem Relation Age of Onset  . Heart disease Neg Hx   . Heart failure Neg Hx   . Prostate cancer      Medications: Patient's Medications  New Prescriptions   No medications on file  Previous Medications   ACETAMINOPHEN (TYLENOL) 500 MG TABLET    Take 500 mg by mouth every 6 (six) hours as needed for mild pain.   ASPIRIN 81 MG TABLET    Take 1 tablet (81 mg total) by mouth every other day.   CARVEDILOL (COREG) 25 MG TABLET    Take 25 mg by mouth 2 (two) times daily with a meal.    FUROSEMIDE (LASIX) 40 MG TABLET    Take 1 tablet (40 mg total) by mouth daily.   LEVALBUTEROL (XOPENEX) 0.63 MG/3ML NEBULIZER SOLUTION    Take 3 mLs (0.63 mg total) by nebulization every 6 (six) hours as needed for wheezing or shortness of breath.   LEVOTHYROXINE (SYNTHROID, LEVOTHROID) 88 MCG TABLET    Take 88 mcg by mouth daily before breakfast.    MULTIPLE VITAMIN (MULTIVITAMIN WITH MINERALS) TABS    Take 1 tablet by mouth daily.   POTASSIUM CHLORIDE SA (K-DUR,KLOR-CON) 20 MEQ TABLET    Take 1 tablet (20 mEq total) by mouth daily.  ULORIC 80 MG TABS    Take 1 tablet by mouth daily.   Modified Medications   No medications on file  Discontinued Medications   SULFAMETHOXAZOLE-TRIMETHOPRIM (BACTRIM DS,SEPTRA DS) 800-160 MG TABLET    Take 1 tablet by mouth 2 (two) times daily.     Physical Exam: Filed Vitals:   09/30/15 1331  BP: 103/53  Pulse: 60  Temp: 96.7 F (35.9 C)  Resp: 20  Weight: 155 lb (70.308 kg)    Physical Exam  Constitutional: He appears well-developed and well-nourished. No distress.  Thin fraile male  HENT:  Mouth/Throat: Oropharynx is clear and moist. No oropharyngeal exudate.  Cardiovascular: Normal rate, regular rhythm and normal heart sounds.   Pulmonary/Chest: Effort normal and breath sounds normal.  Abdominal: Soft. Bowel sounds are normal. He exhibits  no distension. There is no tenderness.  Musculoskeletal: He exhibits no edema or tenderness.  Neurological: He is alert.  Skin: Skin is warm and dry. He is not diaphoretic.    Labs reviewed: Basic Metabolic Panel:  Recent Labs  09/07/15 1758  09/18/15 0543 09/19/15 0513 09/20/15 0439  NA  --   < > 144 142 143  K  --   < > 4.1 4.0 4.3  CL  --   < > 107 106 105  CO2  --   < > 28 30 29   GLUCOSE  --   < > 79 137* 91  BUN  --   < > 48* 48* 42*  CREATININE  --   < > 1.51* 1.70* 1.53*  CALCIUM  --   < > 8.2* 8.1* 8.2*  MG 1.9  --   --   --   --   < > = values in this interval not displayed. Liver Function Tests:  Recent Labs  05/04/15 0905 09/07/15 1000  AST 21 28  ALT 29 21  ALKPHOS 79 65  BILITOT 0.59 1.4*  PROT 6.8 6.3*  ALBUMIN 3.1* 2.8*   No results for input(s): LIPASE, AMYLASE in the last 8760 hours. No results for input(s): AMMONIA in the last 8760 hours. CBC:  Recent Labs  08/25/15 0948 09/07/15 1000  09/17/15 1440  09/19/15 0513 09/20/15 0439 09/21/15 1110  WBC 6.6 11.9*  < > 9.7  < > 7.3 8.0 7.4  NEUTROABS 4.8 9.4*  --  6.5  --   --   --   --   HGB 12.9* 14.8  < > 14.8  < > 12.0* 13.0 13.6  HCT 45.7 53.2*  < > 53.3*  < > 43.1 47.8 48.9  MCV 89.4 89.6  < > 89.9  < > 89.2 90.2 89.6  PLT 84* 67*  < > 9*  < > 37* 27* 10*  < > = values in this interval not displayed. TSH:  Recent Labs  09/07/15 1758  TSH 4.570*   A1C: Lab Results  Component Value Date   HGBA1C 6.5* 01/04/2013   Lipid Panel: No results for input(s): CHOL, HDL, LDLCALC, TRIG, CHOLHDL, LDLDIRECT in the last 8760 hours.   Assessment/Plan 1. Protein-calorie malnutrition, severe -poor PO intake, pt with dementia and has poor attention to task, will have staff assist pt with feeding to help with intake -to start remeron 7.5 mg qhs  2. CAP (community acquired pneumonia) -improved symptoms remains on bactrim (5 days recommended from hospital) -will DC bactrim at this time as pt  has completed course  3. Acute on chronic combined systolic (congestive) and diastolic (congestive) heart failure (  [place]) -fluid status remain stable, euvolemic, will follow up BMP conts on lasix 40 mg daily with coreg BID  4. Anemia due to other cause Recommended follow up Hgb per hosp DC, will follow up at this time  5. Renal insufficiency -will follow cmp     Eugean Arnott K. Harle Battiest  [facility] [phone] 8 am - 5 pm) [phone] (after hours)

## 2015-10-05 ENCOUNTER — Inpatient Hospital Stay (HOSPITAL_COMMUNITY)
Admission: EM | Admit: 2015-10-05 | Discharge: 2015-10-20 | DRG: 871 | Disposition: E | Payer: Medicare Other | Attending: Pulmonary Disease | Admitting: Pulmonary Disease

## 2015-10-05 ENCOUNTER — Emergency Department (HOSPITAL_COMMUNITY): Payer: Medicare Other

## 2015-10-05 DIAGNOSIS — E039 Hypothyroidism, unspecified: Secondary | ICD-10-CM | POA: Diagnosis present

## 2015-10-05 DIAGNOSIS — R402352 Coma scale, best motor response, localizes pain, at arrival to emergency department: Secondary | ICD-10-CM | POA: Diagnosis present

## 2015-10-05 DIAGNOSIS — R6521 Severe sepsis with septic shock: Secondary | ICD-10-CM | POA: Diagnosis present

## 2015-10-05 DIAGNOSIS — Z66 Do not resuscitate: Secondary | ICD-10-CM | POA: Diagnosis present

## 2015-10-05 DIAGNOSIS — D6959 Other secondary thrombocytopenia: Secondary | ICD-10-CM | POA: Diagnosis present

## 2015-10-05 DIAGNOSIS — S065XAA Traumatic subdural hemorrhage with loss of consciousness status unknown, initial encounter: Secondary | ICD-10-CM | POA: Diagnosis present

## 2015-10-05 DIAGNOSIS — R5381 Other malaise: Secondary | ICD-10-CM | POA: Diagnosis present

## 2015-10-05 DIAGNOSIS — Z79899 Other long term (current) drug therapy: Secondary | ICD-10-CM

## 2015-10-05 DIAGNOSIS — R402142 Coma scale, eyes open, spontaneous, at arrival to emergency department: Secondary | ICD-10-CM | POA: Diagnosis present

## 2015-10-05 DIAGNOSIS — R410 Disorientation, unspecified: Secondary | ICD-10-CM | POA: Insufficient documentation

## 2015-10-05 DIAGNOSIS — R001 Bradycardia, unspecified: Secondary | ICD-10-CM | POA: Diagnosis present

## 2015-10-05 DIAGNOSIS — F0390 Unspecified dementia without behavioral disturbance: Secondary | ICD-10-CM | POA: Diagnosis present

## 2015-10-05 DIAGNOSIS — G8191 Hemiplegia, unspecified affecting right dominant side: Secondary | ICD-10-CM | POA: Diagnosis present

## 2015-10-05 DIAGNOSIS — I480 Paroxysmal atrial fibrillation: Secondary | ICD-10-CM | POA: Diagnosis present

## 2015-10-05 DIAGNOSIS — Y92129 Unspecified place in nursing home as the place of occurrence of the external cause: Secondary | ICD-10-CM

## 2015-10-05 DIAGNOSIS — A419 Sepsis, unspecified organism: Principal | ICD-10-CM | POA: Insufficient documentation

## 2015-10-05 DIAGNOSIS — E43 Unspecified severe protein-calorie malnutrition: Secondary | ICD-10-CM | POA: Diagnosis present

## 2015-10-05 DIAGNOSIS — W19XXXA Unspecified fall, initial encounter: Secondary | ICD-10-CM | POA: Diagnosis present

## 2015-10-05 DIAGNOSIS — N179 Acute kidney failure, unspecified: Secondary | ICD-10-CM | POA: Diagnosis not present

## 2015-10-05 DIAGNOSIS — Z515 Encounter for palliative care: Secondary | ICD-10-CM | POA: Diagnosis not present

## 2015-10-05 DIAGNOSIS — E860 Dehydration: Secondary | ICD-10-CM | POA: Diagnosis present

## 2015-10-05 DIAGNOSIS — D471 Chronic myeloproliferative disease: Secondary | ICD-10-CM | POA: Diagnosis present

## 2015-10-05 DIAGNOSIS — D689 Coagulation defect, unspecified: Secondary | ICD-10-CM | POA: Diagnosis present

## 2015-10-05 DIAGNOSIS — I6200 Nontraumatic subdural hemorrhage, unspecified: Secondary | ICD-10-CM

## 2015-10-05 DIAGNOSIS — Z8673 Personal history of transient ischemic attack (TIA), and cerebral infarction without residual deficits: Secondary | ICD-10-CM | POA: Diagnosis not present

## 2015-10-05 DIAGNOSIS — L8990 Pressure ulcer of unspecified site, unspecified stage: Secondary | ICD-10-CM | POA: Insufficient documentation

## 2015-10-05 DIAGNOSIS — Z7189 Other specified counseling: Secondary | ICD-10-CM | POA: Diagnosis not present

## 2015-10-05 DIAGNOSIS — I10 Essential (primary) hypertension: Secondary | ICD-10-CM | POA: Diagnosis present

## 2015-10-05 DIAGNOSIS — I5042 Chronic combined systolic (congestive) and diastolic (congestive) heart failure: Secondary | ICD-10-CM | POA: Diagnosis present

## 2015-10-05 DIAGNOSIS — J449 Chronic obstructive pulmonary disease, unspecified: Secondary | ICD-10-CM | POA: Diagnosis present

## 2015-10-05 DIAGNOSIS — E875 Hyperkalemia: Secondary | ICD-10-CM | POA: Diagnosis present

## 2015-10-05 DIAGNOSIS — Z720 Tobacco use: Secondary | ICD-10-CM | POA: Diagnosis not present

## 2015-10-05 DIAGNOSIS — S065X0A Traumatic subdural hemorrhage without loss of consciousness, initial encounter: Secondary | ICD-10-CM | POA: Diagnosis present

## 2015-10-05 DIAGNOSIS — R4701 Aphasia: Secondary | ICD-10-CM | POA: Diagnosis present

## 2015-10-05 DIAGNOSIS — R471 Dysarthria and anarthria: Secondary | ICD-10-CM | POA: Diagnosis present

## 2015-10-05 DIAGNOSIS — E11649 Type 2 diabetes mellitus with hypoglycemia without coma: Secondary | ICD-10-CM | POA: Diagnosis not present

## 2015-10-05 DIAGNOSIS — D72829 Elevated white blood cell count, unspecified: Secondary | ICD-10-CM | POA: Diagnosis present

## 2015-10-05 DIAGNOSIS — S065X9A Traumatic subdural hemorrhage with loss of consciousness of unspecified duration, initial encounter: Secondary | ICD-10-CM | POA: Diagnosis present

## 2015-10-05 DIAGNOSIS — R579 Shock, unspecified: Secondary | ICD-10-CM

## 2015-10-05 DIAGNOSIS — E872 Acidosis: Secondary | ICD-10-CM | POA: Diagnosis present

## 2015-10-05 DIAGNOSIS — R402232 Coma scale, best verbal response, inappropriate words, at arrival to emergency department: Secondary | ICD-10-CM | POA: Diagnosis present

## 2015-10-05 DIAGNOSIS — R0603 Acute respiratory distress: Secondary | ICD-10-CM

## 2015-10-05 LAB — I-STAT CHEM 8, ED
BUN: 113 mg/dL — ABNORMAL HIGH (ref 6–20)
CREATININE: 5.3 mg/dL — AB (ref 0.61–1.24)
Calcium, Ion: 0.83 mmol/L — ABNORMAL LOW (ref 1.13–1.30)
Chloride: 110 mmol/L (ref 101–111)
GLUCOSE: 90 mg/dL (ref 65–99)
HCT: 54 % — ABNORMAL HIGH (ref 39.0–52.0)
HEMOGLOBIN: 18.4 g/dL — AB (ref 13.0–17.0)
POTASSIUM: 6.7 mmol/L — AB (ref 3.5–5.1)
Sodium: 144 mmol/L (ref 135–145)
TCO2: 21 mmol/L (ref 0–100)

## 2015-10-05 LAB — DIFFERENTIAL
BAND NEUTROPHILS: 0 %
BASOS ABS: 0.6 10*3/uL — AB (ref 0.0–0.1)
BASOS PCT: 2 %
BLASTS: 5 %
EOS PCT: 0 %
Eosinophils Absolute: 0 10*3/uL (ref 0.0–0.7)
LYMPHS ABS: 3 10*3/uL (ref 0.7–4.0)
Lymphocytes Relative: 10 %
METAMYELOCYTES PCT: 0 %
MONOS PCT: 8 %
Monocytes Absolute: 2.4 10*3/uL — ABNORMAL HIGH (ref 0.1–1.0)
Myelocytes: 0 %
NEUTROS ABS: 22.4 10*3/uL — AB (ref 1.7–7.7)
NRBC: 0 /100{WBCs}
Neutrophils Relative %: 75 %
OTHER: 0 %
Promyelocytes Absolute: 0 %

## 2015-10-05 LAB — RAPID URINE DRUG SCREEN, HOSP PERFORMED
Amphetamines: NOT DETECTED
Barbiturates: NOT DETECTED
Benzodiazepines: NOT DETECTED
Cocaine: NOT DETECTED
OPIATES: NOT DETECTED
TETRAHYDROCANNABINOL: NOT DETECTED

## 2015-10-05 LAB — BASIC METABOLIC PANEL
Anion gap: 18 — ABNORMAL HIGH (ref 5–15)
BUN: 100 mg/dL — AB (ref 6–20)
CHLORIDE: 108 mmol/L (ref 101–111)
CO2: 19 mmol/L — ABNORMAL LOW (ref 22–32)
Calcium: 6.9 mg/dL — ABNORMAL LOW (ref 8.9–10.3)
Creatinine, Ser: 5.59 mg/dL — ABNORMAL HIGH (ref 0.61–1.24)
GFR calc Af Amer: 10 mL/min — ABNORMAL LOW (ref >60)
GFR calc non Af Amer: 8 mL/min — ABNORMAL LOW (ref >60)
Glucose, Bld: 148 mg/dL — ABNORMAL HIGH (ref 65–99)
POTASSIUM: 6.2 mmol/L — AB (ref 3.5–5.1)
SODIUM: 145 mmol/L (ref 135–145)

## 2015-10-05 LAB — COMPREHENSIVE METABOLIC PANEL
ALK PHOS: 64 U/L (ref 38–126)
ALT: 52 U/L (ref 17–63)
ANION GAP: 21 — AB (ref 5–15)
AST: 50 U/L — ABNORMAL HIGH (ref 15–41)
Albumin: 2.5 g/dL — ABNORMAL LOW (ref 3.5–5.0)
BILIRUBIN TOTAL: 1.4 mg/dL — AB (ref 0.3–1.2)
BUN: 104 mg/dL — ABNORMAL HIGH (ref 6–20)
CALCIUM: 6.9 mg/dL — AB (ref 8.9–10.3)
CO2: 14 mmol/L — ABNORMAL LOW (ref 22–32)
CREATININE: 5.76 mg/dL — AB (ref 0.61–1.24)
Chloride: 111 mmol/L (ref 101–111)
GFR, EST AFRICAN AMERICAN: 9 mL/min — AB (ref >60)
GFR, EST NON AFRICAN AMERICAN: 8 mL/min — AB (ref >60)
Glucose, Bld: 92 mg/dL (ref 65–99)
Potassium: 7.3 mmol/L (ref 3.5–5.1)
Sodium: 146 mmol/L — ABNORMAL HIGH (ref 135–145)
TOTAL PROTEIN: 5.5 g/dL — AB (ref 6.5–8.1)

## 2015-10-05 LAB — URINALYSIS, ROUTINE W REFLEX MICROSCOPIC
Glucose, UA: NEGATIVE mg/dL
KETONES UR: 15 mg/dL — AB
LEUKOCYTES UA: NEGATIVE
NITRITE: NEGATIVE
PH: 5 (ref 5.0–8.0)
Protein, ur: NEGATIVE mg/dL
SPECIFIC GRAVITY, URINE: 1.018 (ref 1.005–1.030)

## 2015-10-05 LAB — I-STAT TROPONIN, ED: Troponin i, poc: 0.02 ng/mL (ref 0.00–0.08)

## 2015-10-05 LAB — CBC
HEMATOCRIT: 52.1 % — AB (ref 39.0–52.0)
HEMOGLOBIN: 14.2 g/dL (ref 13.0–17.0)
MCH: 25.3 pg — ABNORMAL LOW (ref 26.0–34.0)
MCHC: 27.3 g/dL — AB (ref 30.0–36.0)
MCV: 92.9 fL (ref 78.0–100.0)
Platelets: 19 10*3/uL — CL (ref 150–400)
RBC: 5.61 MIL/uL (ref 4.22–5.81)
RDW: 27.2 % — ABNORMAL HIGH (ref 11.5–15.5)
WBC: 29.8 10*3/uL — ABNORMAL HIGH (ref 4.0–10.5)

## 2015-10-05 LAB — GLUCOSE, CAPILLARY
GLUCOSE-CAPILLARY: 51 mg/dL — AB (ref 65–99)
GLUCOSE-CAPILLARY: 136 mg/dL — AB (ref 65–99)
Glucose-Capillary: 146 mg/dL — ABNORMAL HIGH (ref 65–99)

## 2015-10-05 LAB — APTT: APTT: 47 s — AB (ref 24–37)

## 2015-10-05 LAB — MRSA PCR SCREENING: MRSA BY PCR: NEGATIVE

## 2015-10-05 LAB — URINE MICROSCOPIC-ADD ON

## 2015-10-05 LAB — PROTIME-INR
INR: 1.82 — ABNORMAL HIGH (ref 0.00–1.49)
Prothrombin Time: 21 seconds — ABNORMAL HIGH (ref 11.6–15.2)

## 2015-10-05 LAB — LACTIC ACID, PLASMA: LACTIC ACID, VENOUS: 4.8 mmol/L — AB (ref 0.5–2.0)

## 2015-10-05 LAB — I-STAT CG4 LACTIC ACID, ED: LACTIC ACID, VENOUS: 3.44 mmol/L — AB (ref 0.5–2.0)

## 2015-10-05 LAB — FIBRINOGEN: Fibrinogen: 152 mg/dL — ABNORMAL LOW (ref 204–475)

## 2015-10-05 LAB — CBG MONITORING, ED: GLUCOSE-CAPILLARY: 168 mg/dL — AB (ref 65–99)

## 2015-10-05 LAB — PROCALCITONIN: Procalcitonin: 1.09 ng/mL

## 2015-10-05 LAB — ETHANOL: Alcohol, Ethyl (B): <5 mg/dL (ref <5)

## 2015-10-05 LAB — STREP PNEUMONIAE URINARY ANTIGEN: STREP PNEUMO URINARY ANTIGEN: NEGATIVE

## 2015-10-05 LAB — BRAIN NATRIURETIC PEPTIDE: B NATRIURETIC PEPTIDE 5: 1073.6 pg/mL — AB (ref 0.0–100.0)

## 2015-10-05 MED ORDER — CHLORHEXIDINE GLUCONATE 0.12 % MT SOLN
15.0000 mL | Freq: Two times a day (BID) | OROMUCOSAL | Status: DC
  Administered 2015-10-05 – 2015-10-07 (×4): 15 mL via OROMUCOSAL
  Filled 2015-10-05: qty 15

## 2015-10-05 MED ORDER — CETYLPYRIDINIUM CHLORIDE 0.05 % MT LIQD
7.0000 mL | Freq: Two times a day (BID) | OROMUCOSAL | Status: DC
  Administered 2015-10-06 – 2015-10-07 (×4): 7 mL via OROMUCOSAL

## 2015-10-05 MED ORDER — SODIUM CHLORIDE 0.9 % IV SOLN: 250.0000 mL | INTRAVENOUS | Status: DC | PRN

## 2015-10-05 MED ORDER — DEXTROSE 5 % IV SOLN
INTRAVENOUS | Status: DC
  Administered 2015-10-05: 50 mL via INTRAVENOUS

## 2015-10-05 MED ORDER — DEXTROSE 5 % IV SOLN
30.0000 ug/min | INTRAVENOUS | Status: DC
  Administered 2015-10-05: 60 ug/min via INTRAVENOUS
  Administered 2015-10-05: 30 ug/min via INTRAVENOUS
  Administered 2015-10-05: 60 ug/min via INTRAVENOUS
  Administered 2015-10-06: 70 ug/min via INTRAVENOUS
  Administered 2015-10-06: 130 ug/min via INTRAVENOUS
  Administered 2015-10-06: 70 ug/min via INTRAVENOUS
  Administered 2015-10-06: 100 ug/min via INTRAVENOUS
  Administered 2015-10-06: 80 ug/min via INTRAVENOUS
  Filled 2015-10-05 (×9): qty 1

## 2015-10-05 MED ORDER — VANCOMYCIN HCL IN DEXTROSE 1-5 GM/200ML-% IV SOLN
1000.0000 mg | INTRAVENOUS | Status: DC
  Administered 2015-10-05 – 2015-10-07 (×2): 1000 mg via INTRAVENOUS
  Filled 2015-10-05 (×2): qty 200

## 2015-10-05 MED ORDER — SODIUM CHLORIDE 0.9 % IV BOLUS (SEPSIS)
250.0000 mL | Freq: Once | INTRAVENOUS | Status: AC
  Administered 2015-10-05: 250 mL via INTRAVENOUS

## 2015-10-05 MED ORDER — PANTOPRAZOLE SODIUM 40 MG IV SOLR
40.0000 mg | Freq: Every day | INTRAVENOUS | Status: DC
  Administered 2015-10-05 – 2015-10-06 (×2): 40 mg via INTRAVENOUS
  Filled 2015-10-05 (×3): qty 40

## 2015-10-05 MED ORDER — INSULIN ASPART 100 UNIT/ML IV SOLN
10.0000 [IU] | Freq: Once | INTRAVENOUS | Status: AC
  Administered 2015-10-05: 10 [IU] via INTRAVENOUS
  Filled 2015-10-05: qty 1

## 2015-10-05 MED ORDER — DEXTROSE 50 % IV SOLN
1.0000 | Freq: Once | INTRAVENOUS | Status: AC
  Administered 2015-10-05: 50 mL via INTRAVENOUS
  Filled 2015-10-05: qty 50

## 2015-10-05 MED ORDER — HALOPERIDOL LACTATE 5 MG/ML IJ SOLN
1.0000 mg | INTRAMUSCULAR | Status: DC | PRN
  Administered 2015-10-05 – 2015-10-06 (×3): 1 mg via INTRAVENOUS
  Filled 2015-10-05 (×3): qty 1

## 2015-10-05 MED ORDER — ONDANSETRON HCL 4 MG/2ML IJ SOLN: 4.0000 mg | Freq: Four times a day (QID) | INTRAMUSCULAR | Status: DC | PRN

## 2015-10-05 MED ORDER — SODIUM CHLORIDE 0.9 % IV BOLUS (SEPSIS): 30.0000 mL/kg | Freq: Once | INTRAVENOUS | Status: DC

## 2015-10-05 MED ORDER — DEXTROSE 50 % IV SOLN
25.0000 mL | Freq: Once | INTRAVENOUS | Status: AC
  Administered 2015-10-05: 25 mL via INTRAVENOUS

## 2015-10-05 MED ORDER — SODIUM CHLORIDE 0.9 % IV SOLN
Freq: Once | INTRAVENOUS | Status: AC
  Administered 2015-10-05: 14:00:00 via INTRAVENOUS

## 2015-10-05 MED ORDER — SODIUM CHLORIDE 0.9 % IV SOLN
1.0000 g | Freq: Once | INTRAVENOUS | Status: AC
  Administered 2015-10-05: 1 g via INTRAVENOUS
  Filled 2015-10-05: qty 10

## 2015-10-05 MED ORDER — DEXTROSE 50 % IV SOLN
INTRAVENOUS | Status: AC
Start: 2015-10-05 — End: 2015-10-05
  Filled 2015-10-05: qty 50

## 2015-10-05 MED ORDER — DEXTROSE 5 % IV SOLN
2.0000 g | INTRAVENOUS | Status: DC
  Administered 2015-10-05 – 2015-10-07 (×2): 2 g via INTRAVENOUS
  Filled 2015-10-05 (×2): qty 2

## 2015-10-05 MED ORDER — INSULIN ASPART 100 UNIT/ML ~~LOC~~ SOLN
0.0000 [IU] | SUBCUTANEOUS | Status: DC
  Administered 2015-10-05 – 2015-10-06 (×2): 2 [IU] via SUBCUTANEOUS
  Administered 2015-10-06: 3 [IU] via SUBCUTANEOUS
  Administered 2015-10-06 (×2): 2 [IU] via SUBCUTANEOUS
  Administered 2015-10-06: 3 [IU] via SUBCUTANEOUS
  Administered 2015-10-07: 2 [IU] via SUBCUTANEOUS

## 2015-10-05 MED ORDER — SODIUM CHLORIDE 0.9 % IV SOLN: Freq: Once | INTRAVENOUS | Status: DC

## 2015-10-05 MED ORDER — LEVOTHYROXINE SODIUM 100 MCG IV SOLR
44.0000 ug | Freq: Every day | INTRAVENOUS | Status: DC
  Administered 2015-10-06 – 2015-10-07 (×2): 44 ug via INTRAVENOUS
  Filled 2015-10-05 (×2): qty 5

## 2015-10-05 MED ORDER — SODIUM CHLORIDE 0.9 % IV BOLUS (SEPSIS)
500.0000 mL | Freq: Once | INTRAVENOUS | Status: AC
  Administered 2015-10-05: 500 mL via INTRAVENOUS

## 2015-10-05 MED ORDER — SODIUM CHLORIDE 0.9 % IV BOLUS (SEPSIS)
1000.0000 mL | Freq: Once | INTRAVENOUS | Status: AC
  Administered 2015-10-05: 1000 mL via INTRAVENOUS

## 2015-10-05 MED ORDER — SODIUM POLYSTYRENE SULFONATE 15 GM/60ML PO SUSP
30.0000 g | Freq: Once | ORAL | Status: AC
  Administered 2015-10-05: 30 g via RECTAL
  Filled 2015-10-05: qty 120

## 2015-10-05 NOTE — Progress Notes (Signed)
Patient ID: Kyle Heath, male   DOB: Aug 07, 1927, 81 y.o.   MRN: [id number] Unofficial consult to review the films in this patient with multiple medical comorbidities. Subdurals are currently nonoperative. I would not recommend surgical intervention. Should any change or the patient's other medical comorbidities make him any kind of candidate for surgery and his subdurals expand and he clinically progresses, then we may reconsider. I will be happy to make this an official consult as needed please call me if you would prefer that.

## 2015-10-05 NOTE — Progress Notes (Signed)
Family updated on current problems and prognosis. After this pt's family still requesting aggressive and supportive care but do not want their father to suffer.  Based on our discussion we have decided to change Code status to DNR.  The family requests we continue current level of care including: blood products and pressors He is however: DNR. No CPR or Intubation  Erick Colace ACNP-BC [place] Pager # [phone] OR # [phone] if no answer

## 2015-10-05 NOTE — ED Notes (Signed)
Critical care at bedside  

## 2015-10-05 NOTE — Progress Notes (Signed)
ANTIBIOTIC CONSULT NOTE - INITIAL  Pharmacy Consult for vanc/fortaz Indication: HCAP  No Known Allergies  Patient Measurements:    Vital Signs: Temp: 97.5 F (36.4 C) (11/16 1355) Temp Source: Oral (11/16 1355) BP: 68/41 mmHg (11/16 1329) Pulse Rate: 54 (11/16 1329) Intake/Output from previous day:   Intake/Output from this shift: Total I/O In: 1699 [I.V.:1500; Blood:199] Out: -   Labs:  Recent Labs  09/30/2015 1130 10/09/2015 1132  WBC 29.8*  --   HGB 14.2 18.4*  PLT 19*  --   CREATININE 5.76* 5.30*   Estimated Creatinine Clearance: 9.9 mL/min (by C-G formula based on Cr of 5.3). No results for input(s): VANCOTROUGH, VANCOPEAK, VANCORANDOM, GENTTROUGH, GENTPEAK, GENTRANDOM, TOBRATROUGH, TOBRAPEAK, TOBRARND, AMIKACINPEAK, AMIKACINTROU, AMIKACIN in the last 72 hours.   Microbiology: Recent Results (from the past 720 hour(s))  Urine culture     Status: None   Collection Time: 09/07/15  1:50 PM  Result Value Ref Range Status   Specimen Description URINE, CLEAN CATCH  Final   Special Requests Normal  Final   Culture MULTIPLE SPECIES PRESENT, SUGGEST RECOLLECTION  Final   Report Status 09/09/2015 FINAL  Final  Urine culture     Status: None   Collection Time: 09/17/15  3:00 PM  Result Value Ref Range Status   Specimen Description URINE, RANDOM  Final   Special Requests NONE  Final   Culture   Final    MULTIPLE SPECIES PRESENT, SUGGEST RECOLLECTION Performed at [facility]    Report Status 09/19/2015 FINAL  Final  Culture, body fluid-bottle     Status: None   Collection Time: 09/18/15 11:34 AM  Result Value Ref Range Status   Specimen Description FLUID RIGHT PLEURAL  Final   Special Requests NONE  Final   Gram Stain   Final    GRAM POSITIVE COCCI IN CLUSTERS AEROBIC BOTTLE ONLY CRITICAL RESULT CALLED TO, READ BACK BY AND VERIFIED WITH: A JOHNSON,RN AT [id number] 09/19/15 BY L BENFIELD    Culture   Final    STAPHYLOCOCCUS SPECIES (COAGULASE  NEGATIVE) Performed at [facility]    Report Status 09/22/2015 FINAL  Final   Organism ID, Bacteria STAPHYLOCOCCUS SPECIES (COAGULASE NEGATIVE)  Final      Susceptibility   Staphylococcus species (coagulase negative) - MIC*    CIPROFLOXACIN <=0.5 SENSITIVE Sensitive     ERYTHROMYCIN 0.5 SENSITIVE Sensitive     GENTAMICIN <=0.5 SENSITIVE Sensitive     OXACILLIN <=0.25 RESISTANT Resistant     TETRACYCLINE <=1 SENSITIVE Sensitive     VANCOMYCIN <=0.5 SENSITIVE Sensitive     TRIMETH/SULFA <=10 SENSITIVE Sensitive     CLINDAMYCIN <=0.25 SENSITIVE Sensitive     RIFAMPIN <=0.5 SENSITIVE Sensitive     Inducible Clindamycin NEGATIVE Sensitive     * STAPHYLOCOCCUS SPECIES (COAGULASE NEGATIVE)  Gram stain     Status: None   Collection Time: 09/18/15 11:34 AM  Result Value Ref Range Status   Specimen Description FLUID RIGHT PLEURAL  Final   Special Requests NONE  Final   Gram Stain   Final    WBC PRESENT,BOTH PMN AND MONONUCLEAR NO ORGANISMS SEEN CONFIRMEDC BY K.MATILAINEN Performed at [facility]    Report Status 09/18/2015 FINAL  Final    Medical History: Past Medical History  Diagnosis Date  . Anemia   . Hypothyroidism   . Hypertension   . Myeloproliferative disorder ([place]) 06/30/2012  . Paroxysmal atrial flutter ([place]) 2014  . Chronic combined systolic and diastolic CHF (congestive heart  failure) ([place])     a. EF in 2014 55-60% b. EF in 08/2015 25-30%  . Gout   . BPH (benign prostatic hypertrophy)   . COPD (chronic obstructive pulmonary disease) ([place])   . Bone marrow disorder   . CVA (cerebral vascular accident) ([place])   . Type II diabetes mellitus ([place])   . History of blood transfusion "several"  . Dementia   . Arthritis     "mostly in joints" (09/07/2015)  . Use of cane as ambulatory aid     Assessment: 87 yom from SNF with SDH on admit. To admit to the ICU. Pharmacy consulted to dose vanc and cefaz for HCAP. Low temps, wbc 29.8 on admit. AKI - SCr  5.76>>5.3, CrCl~9 (not on dialysis).  11/16 vanc>> 11/16 ceftaz>>  11/16 BCx2>>  Goal of Therapy:  Vancomycin trough level 15-20 mcg/ml  Plan:  Ceftaz 2g IV q48h Vanc 1g IV q48h Monitor clinical progress, c/s, renal function, abx plan/LOT VT@SS  as indicated  Elicia Lamp, PharmD Clinical Pharmacist Pager [phone] 10/11/2015 2:40 PM

## 2015-10-05 NOTE — ED Provider Notes (Signed)
CSN: [id number]     Arrival date & time 10/13/2015  1123 History   First MD Initiated Contact with Patient 09/23/2015 1140    Level 5 caveat, altered mental status Chief Complaint  Patient presents with  . Altered Mental Status   HPI Pt presents to the ED with altered mental status. He is a resident of a nursing facility.  Yesterday he had a fall.  They were monitoring him with every 2 hour neuro checks.  This morning at around 1040 am they noticed that he was not speaking properly and was not following commands.  He presented as a code stroke.  Pt is not able to speak to me and provide any history.  Past Medical History  Diagnosis Date  . Anemia   . Hypothyroidism   . Hypertension   . Myeloproliferative disorder ([place]) 06/30/2012  . Paroxysmal atrial flutter ([place]) 2014  . Chronic combined systolic and diastolic CHF (congestive heart failure) ([place])     a. EF in 2014 55-60% b. EF in 08/2015 25-30%  . Gout   . BPH (benign prostatic hypertrophy)   . COPD (chronic obstructive pulmonary disease) ([place])   . Bone marrow disorder   . CVA (cerebral vascular accident) ([place])   . Type II diabetes mellitus ([place])   . History of blood transfusion "several"  . Dementia   . Arthritis     "mostly in joints" (09/07/2015)  . Use of cane as ambulatory aid    Past Surgical History  Procedure Laterality Date  . Appendectomy    . Thyroid surgery    . Cyst removal neck  2009   Family History  Problem Relation Age of Onset  . Heart disease Neg Hx   . Heart failure Neg Hx   . Prostate cancer     Social History  Substance Use Topics  . Smoking status: Former Smoker    Types: Cigarettes, Cigars  . Smokeless tobacco: Current User    Types: Chew     Comment: 09/07/2015 "don't know of any cigarettes/cigar smoking"  . Alcohol Use: No    Review of Systems  Unable to perform ROS: Mental status change      Allergies  Review of patient's allergies indicates no known allergies.  Home Medications    Prior to Admission medications   Medication Sig Start Date End Date Taking? Authorizing Provider  acetaminophen (TYLENOL) 500 MG tablet Take 500 mg by mouth every 6 (six) hours as needed for mild pain.    Historical Provider, MD  aspirin 81 MG tablet Take 1 tablet (81 mg total) by mouth every other day. 09/21/15   Theodis Blaze, MD  carvedilol (COREG) 25 MG tablet Take 25 mg by mouth 2 (two) times daily with a meal.  08/07/14   Historical Provider, MD  furosemide (LASIX) 40 MG tablet Take 1 tablet (40 mg total) by mouth daily. 09/21/15   Theodis Blaze, MD  levalbuterol (XOPENEX) 0.63 MG/3ML nebulizer solution Take 3 mLs (0.63 mg total) by nebulization every 6 (six) hours as needed for wheezing or shortness of breath. 09/21/15   Theodis Blaze, MD  levothyroxine (SYNTHROID, LEVOTHROID) 88 MCG tablet Take 88 mcg by mouth daily before breakfast.     Historical Provider, MD  Multiple Vitamin (MULTIVITAMIN WITH MINERALS) TABS Take 1 tablet by mouth daily.    Historical Provider, MD  potassium chloride SA (K-DUR,KLOR-CON) 20 MEQ tablet Take 1 tablet (20 mEq total) by mouth daily. 01/12/15   Mathis Dad  Shadad, MD  ULORIC 80 MG TABS Take 1 tablet by mouth daily.  04/21/15   Historical Provider, MD   BP 110/67 mmHg  Temp(Src) 95.6 F (35.3 C) (Temporal)  Resp 13  SpO2 100% Physical Exam  Constitutional: No distress.  Frail, elderly   HENT:  Head: Normocephalic and atraumatic.  MM dry   Eyes: Conjunctivae are normal. Pupils are equal, round, and reactive to light.  Neck: Normal range of motion. Neck supple. No tracheal deviation present. No thyromegaly present.  Cardiovascular: Normal rate, regular rhythm and normal heart sounds.  Exam reveals no friction rub.   No murmur heard. Pulmonary/Chest: Breath sounds normal. No respiratory distress. He has no wheezes.  Abdominal: Soft. Bowel sounds are normal. He exhibits no distension. There is no tenderness. There is no rebound.  Musculoskeletal: He exhibits  no edema or tenderness.  Neurological: GCS eye subscore is 4. GCS verbal subscore is 3. GCS motor subscore is 5.  Pt unable to comply with exam, does move extremities but not following commands, unable to understand his speach  Skin: He is not diaphoretic.    ED Course  Procedures (including critical care time) CRITICAL CARE Performed by: [id number] Total critical care time: 40 minutes Critical care time was exclusive of separately billable procedures and treating other patients. Critical care was necessary to treat or prevent imminent or life-threatening deterioration. Critical care was time spent personally by me on the following activities: development of treatment plan with patient and/or surrogate as well as nursing, discussions with consultants, evaluation of patient's response to treatment, examination of patient, obtaining history from patient or surrogate, ordering and performing treatments and interventions, ordering and review of laboratory studies, ordering and review of radiographic studies, pulse oximetry and re-evaluation of patient's condition.  Labs Review Labs Reviewed  PROTIME-INR - Abnormal; Notable for the following:    Prothrombin Time 21.0 (*)    INR 1.82 (*)    All other components within normal limits  APTT - Abnormal; Notable for the following:    aPTT 47 (*)    All other components within normal limits  CBC - Abnormal; Notable for the following:    HCT 52.1 (*)    MCH 25.3 (*)    MCHC 27.3 (*)    RDW 27.2 (*)    All other components within normal limits  I-STAT CHEM 8, ED - Abnormal; Notable for the following:    Potassium 6.7 (*)    BUN 113 (*)    Creatinine, Ser 5.30 (*)    Calcium, Ion 0.83 (*)    Hemoglobin 18.4 (*)    HCT 54.0 (*)    All other components within normal limits  ETHANOL  DIFFERENTIAL  COMPREHENSIVE METABOLIC PANEL  URINE RAPID DRUG SCREEN, HOSP PERFORMED  URINALYSIS, ROUTINE W REFLEX MICROSCOPIC (NOT AT [facility])  I-STAT TROPOININ,  ED    Imaging Review Ct Head Wo Contrast  10/09/2015  CLINICAL DATA:  altered mental status. EXAM: CT HEAD WITHOUT CONTRAST TECHNIQUE: Contiguous axial images were obtained from the base of the skull through the vertex without intravenous contrast. COMPARISON:  CT 07/02/2012 FINDINGS: There is a high-density extra-axial fluid collection extending along the high RIGHT cerebral convexity measuring 9 mm in depth (image 23, series 2). No midline shift. No hydrocephalus. No parenchymal hemorrhage. No intraventricular hemorrhage. Second extra-axial fluid collection anterior to the LEFT frontal lobe which is much smaller measuring 15 cm in length and 5 mm in depth. Even smaller extra-axial collection posterior to the  LEFT parietal lobe measuring 5 mm. A third small extra-axial hemorrhage adjacent to the LEFT temporal lobe on image 25, series 2. There is generalized cortical atrophy. There is mild periventricular white matter hypodensities. New No evidence of skull fracture.  The basilar cisterns are patent Paranasal sinuses and  mastoid air cells are clear. IMPRESSION: 1. Moderate size acute RIGHT subdural hematoma. 2. Three small LEFT extra-axial hemorrhages also likely representing subdural hematomas. 3. No midline shift.  Basilar cisterns are patent. Critical Value/emergent results were called by telephone at the time of interpretation on 10/04/2015 at 11:50 am to Dr. Dr. Silverio Decamp, who verbally acknowledged these results. Electronically Signed   By: Suzy Bouchard M.D.   On: 09/26/2015 11:54   I have personally reviewed and evaluated these images and lab results as part of my medical decision-making.   EKG Interpretation   Date/Time:  Wednesday October 05 2015 11:40:33 EST Ventricular Rate:  88 PR Interval:  59 QRS Duration: 94 QT Interval:  411 QTC Calculation: 497 R Axis:   -77 Text Interpretation:  Sinus rhythm Paired ventricular premature complexes  Short PR interval Inferior infarct, old  Lateral leads are also involved  nonspecific T-wave changes since prior tracing Confirmed by Makira Holleman  MD-J,  Slate Debroux [id number]) on 10/01/2015 11:47:53 AM      MDM   Final diagnoses:  Subdural hematoma (HCC)  Acute renal failure, unspecified acute renal failure type (HCC)  Hyperkalemia    Pt's lab tests show acute renal failure.   His CT scan shows subdural hematomas.  Pt has complex medical history with chronic myeloproliferative disorder.  Discussed with Dr Saintclair Halsted.  Pt is not a candidate for surgical intervention at this time.    Will give patient IV fluids.  It is possible his renal failure is from dehydration.  Will need to monitor.   Elevated wbc.  Monitor for infection.  Possible stress demargination.  Plan on treatment of his hyperkalemia.  Overall , poor prognosis.  Consider palliative care consult.  Further discussion when family arrives.  Consult with medical service.  1244  Discussed case with family.  Pt does want to be a full code.  Not sure about intubation.  He is protecting his airway now.  I spoke with Dr Letta Median.  Requests ICU admission considering his degree of illness.   Dorie Rank, MD 09/25/2015 1245

## 2015-10-05 NOTE — Progress Notes (Signed)
[place] Progress Note Patient Name: Kyle Heath DOB: [birth year] MRN: [id number]   Date of Service  09/26/2015  HPI/Events of Note  Multiple issues: 1. Agitation, 2. K+ = 7.3 >> 6.7 >> 6.2 and 3. Lactic Acid = 4.8. QTc = 0.45  eICU Interventions  Will order: 1. Haldol 1 mg IV Q 3 hours PRN agitation. 2. Monitor QTc interval Q 6 hours. Notify MD if QTc interval > 500 milliseconds.  3. Bolus with 0.9 NaCl 1 liter IV over 1 hour now.  4. Kayexalate 30 gm rectally X 1 now.      Intervention Category Major Interventions: Electrolyte abnormality - evaluation and management;Acid-Base disturbance - evaluation and management Minor Interventions: Agitation / anxiety - evaluation and management  Sommer,Steven Eugene 09/24/2015, 9:08 PM

## 2015-10-05 NOTE — ED Notes (Signed)
Per [facility] patient fell at SNF yesterday and has been undergoing Q2H neuro checks.  At 1040 AM today patient presented with altered mental status.  Patient airway is patent.

## 2015-10-05 NOTE — ED Notes (Signed)
Md aware of pts decreased HR and BP. Will continue to monitor

## 2015-10-05 NOTE — Consult Note (Signed)
Referring Physician: Tomi Bamberger    Chief Complaint: Code stroke  HPI:                                                                                                                                         Kyle Heath is an 81 y.o. male presenting to hospital as code stroke. The history is not fully clear but from EMS,  there was a change in mental status and EMS was called. He did sustain a fall yesterday per staff. CT head obtained and shows right SDH. At present time he is able to state his name and follow commands but is very dysarthric. patient has been seen by palliative in the past but hospice care has not been involved.    Date last known well: Date: 09/27/2015 Time last known well: Time: 10:40 tPA Given: No: SDH    Past Medical History  Diagnosis Date  . Anemia   . Hypothyroidism   . Hypertension   . Myeloproliferative disorder ([place]) 06/30/2012  . Paroxysmal atrial flutter ([place]) 2014  . Chronic combined systolic and diastolic CHF (congestive heart failure) ([place])     a. EF in 2014 55-60% b. EF in 08/2015 25-30%  . Gout   . BPH (benign prostatic hypertrophy)   . COPD (chronic obstructive pulmonary disease) ([place])   . Bone marrow disorder   . CVA (cerebral vascular accident) ([place])   . Type II diabetes mellitus ([place])   . History of blood transfusion "several"  . Dementia   . Arthritis     "mostly in joints" (09/07/2015)  . Use of cane as ambulatory aid     Past Surgical History  Procedure Laterality Date  . Appendectomy    . Thyroid surgery    . Cyst removal neck  2009    Family History  Problem Relation Age of Onset  . Heart disease Neg Hx   . Heart failure Neg Hx   . Prostate cancer     Social History:  reports that he has quit smoking. His smoking use included Cigarettes and Cigars. His smokeless tobacco use includes Chew. He reports that he does not drink alcohol or use illicit drugs.  Allergies: No Known Allergies  Medications:  No current facility-administered medications for this encounter.   Current Outpatient Prescriptions  Medication Sig Dispense Refill  . acetaminophen (TYLENOL) 500 MG tablet Take 500 mg by mouth every 6 (six) hours as needed for mild pain.    [name] Kitchen aspirin 81 MG tablet Take 1 tablet (81 mg total) by mouth every other day. 30 tablet   . carvedilol (COREG) 25 MG tablet Take 25 mg by mouth 2 (two) times daily with a meal.   0  . furosemide (LASIX) 40 MG tablet Take 1 tablet (40 mg total) by mouth daily. 30 tablet 0.  . levalbuterol (XOPENEX) 0.63 MG/3ML nebulizer solution Take 3 mLs (0.63 mg total) by nebulization every 6 (six) hours as needed for wheezing or shortness of breath. 3 mL 1  . levothyroxine (SYNTHROID, LEVOTHROID) 88 MCG tablet Take 88 mcg by mouth daily before breakfast.     . Multiple Vitamin (MULTIVITAMIN WITH MINERALS) TABS Take 1 tablet by mouth daily.    . potassium chloride SA (K-DUR,KLOR-CON) 20 MEQ tablet Take 1 tablet (20 mEq total) by mouth daily. 30 tablet 0  . ULORIC 80 MG TABS Take 1 tablet by mouth daily.   0   Facility-Administered Medications Ordered in Other Encounters  Medication Dose Route Frequency Provider Last Rate Last Dose  . furosemide (LASIX) injection 20 mg  20 mg Intravenous Once [name], NP   Stopped at 02/13/13 1623     ROS:                                                                                                                                       History obtained from unobtainable from patient due to language barrier  Neurologic Examination:                                                                                                      Blood pressure 110/67, temperature 95.6 F (35.3 C), temperature source Temporal, resp. rate 13.  Neurological Examination Mental Status: Alert, not oriented.  Speech dysarthric with no notable aphasia.  Able  to follow  Simple commands without difficulty. Cranial Nerves: II:   Visual fields grossly normal, pupils equal, round, reactive to light and accommodation III,IV, VI: ptosis not present, extra-ocular motions intact bilaterally V,VII: Grimace asymmetric on the right, mask  facies noted  Motor: Moving all extremities antigravity but left side appears weaker than right  Sensory: unable to assess due to mental status changes   Deep Tendon Reflexes: 2+  and symmetric throughout UE no KJ or AJ bilaterally Plantars: Right: downgoing   Left: downgoing Cerebellar: Unable to perform finger nose testing, no gross appendicular ataxia noted Gait: deferred   Lab Results: Basic Metabolic Panel:  Recent Labs Lab 10/01/2015 1132  NA 144  K 6.7*  CL 110  GLUCOSE 90  BUN 113*  CREATININE 5.30*    Liver Function Tests: No results for input(s): AST, ALT, ALKPHOS, BILITOT, PROT, ALBUMIN in the last 168 hours. No results for input(s): LIPASE, AMYLASE in the last 168 hours. No results for input(s): AMMONIA in the last 168 hours.  CBC:  Recent Labs Lab 09/28/2015 1130 10/19/2015 1132  WBC PENDING  --   NEUTROABS PENDING  --   HGB 14.2 18.4*  HCT 52.1* 54.0*  MCV 92.9  --   PLT PENDING  --     Cardiac Enzymes: No results for input(s): CKTOTAL, CKMB, CKMBINDEX, TROPONINI in the last 168 hours.  Lipid Panel: No results for input(s): CHOL, TRIG, HDL, CHOLHDL, VLDL, LDLCALC in the last 168 hours.  CBG: No results for input(s): GLUCAP in the last 168 hours.  Microbiology: Results for orders placed or performed during the hospital encounter of 09/17/15  Urine culture     Status: None   Collection Time: 09/17/15  3:00 PM  Result Value Ref Range Status   Specimen Description URINE, RANDOM  Final   Special Requests NONE  Final   Culture   Final    MULTIPLE SPECIES PRESENT, SUGGEST RECOLLECTION Performed at [facility]    Report Status 09/19/2015 FINAL  Final  Culture, body  fluid-bottle     Status: None   Collection Time: 09/18/15 11:34 AM  Result Value Ref Range Status   Specimen Description FLUID RIGHT PLEURAL  Final   Special Requests NONE  Final   Gram Stain   Final    GRAM POSITIVE COCCI IN CLUSTERS AEROBIC BOTTLE ONLY CRITICAL RESULT CALLED TO, READ BACK BY AND VERIFIED WITH: A JOHNSON,RN AT [id number] 09/19/15 BY L BENFIELD    Culture   Final    STAPHYLOCOCCUS SPECIES (COAGULASE NEGATIVE) Performed at [facility]    Report Status 09/22/2015 FINAL  Final   Organism ID, Bacteria STAPHYLOCOCCUS SPECIES (COAGULASE NEGATIVE)  Final      Susceptibility   Staphylococcus species (coagulase negative) - MIC*    CIPROFLOXACIN <=0.5 SENSITIVE Sensitive     ERYTHROMYCIN 0.5 SENSITIVE Sensitive     GENTAMICIN <=0.5 SENSITIVE Sensitive     OXACILLIN <=0.25 RESISTANT Resistant     TETRACYCLINE <=1 SENSITIVE Sensitive     VANCOMYCIN <=0.5 SENSITIVE Sensitive     TRIMETH/SULFA <=10 SENSITIVE Sensitive     CLINDAMYCIN <=0.25 SENSITIVE Sensitive     RIFAMPIN <=0.5 SENSITIVE Sensitive     Inducible Clindamycin NEGATIVE Sensitive     * STAPHYLOCOCCUS SPECIES (COAGULASE NEGATIVE)  Gram stain     Status: None   Collection Time: 09/18/15 11:34 AM  Result Value Ref Range Status   Specimen Description FLUID RIGHT PLEURAL  Final   Special Requests NONE  Final   Gram Stain   Final    WBC PRESENT,BOTH PMN AND MONONUCLEAR NO ORGANISMS SEEN CONFIRMEDC BY K.MATILAINEN Performed at [facility]    Report Status 09/18/2015 FINAL  Final    Coagulation Studies:  Recent Labs  09/26/2015 1130  LABPROT 21.0*  INR 1.82*    Imaging: CT of the head done in the ER showed evidence of bilateral subdural hemorrhages, larger acute  hemorrhage in the right cerebral convexity, with predominantly subdural hygroma in the left convexity with smaller acute hemorrhage within it.   Assessment and plan discussed with with attending physician and they are in agreement.     Etta Quill PA-C [facility] [phone]  10/01/2015, 11:50 AM      Assessment: 81 y.o. male  patient was transferred to the emergency room from outside facility due to worsening mental status changes. Patient continues to have altered mental status with limited cooperation with neurological examination as described. CT of the head done in the ER showed evidence of bilateral subdural hemorrhages, larger acute hemorrhage in the right cerebral convexity, with predominantly subdural hygroma in the left convexity with smaller acute hemorrhage within it.   Patient is known to have multiple medical problems including atrial fibrillation, systolic congestive heart failure, COPD and myeloproliferative disorder with chronic thrombocytopenia, status post multiple transfusions.  Recommend neurosurgical consultation regarding the acute subdural hemorrhages.     Patient evaluated along with physician assistant, Etta Quill. Helped him formulate the plan. Agree with the documented assessment.     Addendum: He was not deemed to be a neuro surgical candidate for subdural hematoma evacuation. Due to poor prognosis, with multiple medical co morbidities, palliative care was consulted. Patient passed away on 2015-10-24.

## 2015-10-05 NOTE — Code Documentation (Signed)
81yo male arriving to [facility] via [place] at 1123 from SNF.  Patient reportedly fell yesterday at SNF and today had sudden onset garbled speech at 1040.  Stroke team at the bedside on patient arrival.  Labs drawn and patient cleared for CT by Dr. Tomi Bamberger.  Patient to CT with stroke team and ED RN.  CT showing SDH.  Patient back to room.  Code stroke canceled.  Bedside handoff with ED RN Catalina Antigua.

## 2015-10-05 NOTE — Progress Notes (Signed)
[place] Progress Note Patient Name: Kyle Heath DOB: 05/18/1927 MRN: [id number]   Date of Service  09/24/2015  HPI/Events of Note  Hypoglycemia - Blood glucose = 51. Given 1/2 amp D50 per protocol.   eICU Interventions  Will order D5W to run IV at 50 mL/hour.      Intervention Category Major Interventions: Other:  Lysle Dingwall 10/17/2015, 5:37 PM

## 2015-10-05 NOTE — Progress Notes (Signed)
PCCM Interval Progress Note  Events:  RN informs me that pt routinely attempting to get out of bed despite frequent re-orienting / re-directing.  He is at risk of fall and subsequent injury.  Interventions: Bedside sitter ordered for pt safety.   Montey Hora, [place] [facility] [place] [facility] Pager: [phone]  or [phone] 09/25/2015, 6:05 PM

## 2015-10-05 NOTE — ED Notes (Signed)
Critical care MD at bedside 

## 2015-10-05 NOTE — H&P (Signed)
PULMONARY / CRITICAL CARE MEDICINE   Name: Kyle Heath MRN: [id number] DOB: 10-20-1927    ADMISSION DATE:  10/09/2015  REFERRING MD :  EDP  CHIEF COMPLAINT:  SDH  INITIAL PRESENTATION: 81yo male with multiple medical problems including HTN, myeloproliferative disorder, AFib, combined heart failure, COPD, DM.  Pt with multiple recent admissions for acute on chronic heart failure, thrombocytopenia, AKI, failure to thrive.  Presented 11/16 with AMS after a fall at SNF.  Code stroke called initially and CT head revealed multiple acute SDH.  PCCM asked to admit to ICU.   STUDIES:  CT head 11/16>>> moderate acute R SDH, 3 small L SDH, no midline shift.   SIGNIFICANT EVENTS:    HISTORY OF PRESENT ILLNESS:  81yo male with multiple medical problems including HTN, myeloproliferative disorder, AFib, combined heart failure, COPD, DM.  Pt with multiple recent admissions for acute on chronic heart failure, thrombocytopenia, AKI, failure to thrive.  Presented 11/16 with AMS after a fall at SNF.  Code stroke called initially and CT head revealed multiple acute SDH.  PCCM asked to admit to ICU.    PAST MEDICAL HISTORY :   has a past medical history of Anemia; Hypothyroidism; Hypertension; Myeloproliferative disorder ([place]) (06/30/2012); Paroxysmal atrial flutter ([place]) (2014); Chronic combined systolic and diastolic CHF (congestive heart failure) ([place]); Gout; BPH (benign prostatic hypertrophy); COPD (chronic obstructive pulmonary disease) ([place]); Bone marrow disorder; CVA (cerebral vascular accident) ([place]); Type II diabetes mellitus ([place]); History of blood transfusion ("several"); Dementia; Arthritis; and Use of cane as ambulatory aid.  has past surgical history that includes Appendectomy; Thyroid surgery; and Cyst removal neck (2009). Prior to Admission medications   Medication Sig Start Date End Date Taking? Authorizing Provider  acetaminophen (TYLENOL) 500 MG tablet Take 500 mg by mouth every 6 (six)  hours as needed for mild pain.   Yes Historical Provider, MD  aspirin 81 MG tablet Take 1 tablet (81 mg total) by mouth every other day. 09/21/15  Yes Theodis Blaze, MD  bisacodyl (DULCOLAX) 10 MG suppository Place 10 mg rectally as needed for moderate constipation.   Yes Historical Provider, MD  carvedilol (COREG) 25 MG tablet Take 25 mg by mouth 2 (two) times daily with a meal.  08/07/14  Yes Historical Provider, MD  furosemide (LASIX) 40 MG tablet Take 1 tablet (40 mg total) by mouth daily. 09/21/15  Yes Theodis Blaze, MD  Influenza vac split quadrivalent PF (FLUARIX) 0.5 ML injection Inject 0.5 mLs into the muscle once.   Yes Historical Provider, MD  levalbuterol (XOPENEX) 0.63 MG/3ML nebulizer solution Take 3 mLs (0.63 mg total) by nebulization every 6 (six) hours as needed for wheezing or shortness of breath. 09/21/15  Yes Theodis Blaze, MD  levothyroxine (SYNTHROID, LEVOTHROID) 88 MCG tablet Take 88 mcg by mouth daily before breakfast.    Yes Historical Provider, MD  magnesium hydroxide (MILK OF MAGNESIA) 400 MG/5ML suspension Take 30 mLs by mouth daily as needed for mild constipation.   Yes Historical Provider, MD  mirtazapine (REMERON) 7.5 MG tablet Take 7.5 mg by mouth at bedtime.   Yes Historical Provider, MD  Multiple Vitamin (MULTIVITAMIN WITH MINERALS) TABS Take 1 tablet by mouth daily.   Yes Historical Provider, MD  potassium chloride SA (K-DUR,KLOR-CON) 20 MEQ tablet Take 1 tablet (20 mEq total) by mouth daily. 01/12/15  Yes Wyatt Portela, MD  Sodium Phosphates (RA SALINE ENEMA) 19-7 GM/118ML ENEM Place 1 application rectally once.   Yes Historical Provider, MD  ULORIC 80 MG TABS Take 1 tablet by mouth daily.  04/21/15  Yes Historical Provider, MD   No Known Allergies  FAMILY HISTORY:  has no family status information on file.  SOCIAL HISTORY:  reports that he has quit smoking. His smoking use included Cigarettes and Cigars. His smokeless tobacco use includes Chew. He reports that he  does not drink alcohol or use illicit drugs.  REVIEW OF SYSTEMS:  As per HPI obtained from family and records - All other systems reviewed and were neg.    SUBJECTIVE:   VITAL SIGNS: Temp:  [94.3 F (34.6 C)-97.5 F (36.4 C)] 97.5 F (36.4 C) (11/16 1355) Pulse Rate:  [54-77] 54 (11/16 1329) Resp:  [10-16] 12 (11/16 1329) BP: (68-110)/(40-67) 68/41 mmHg (11/16 1329) SpO2:  [93 %-100 %] 98 % (11/16 1329) HEMODYNAMICS:   VENTILATOR SETTINGS:   INTAKE / OUTPUT:  Intake/Output Summary (Last 24 hours) at 10/02/2015 1505 Last data filed at 09/24/2015 1331  Gross per 24 hour  Intake   1699 ml  Output      0 ml  Net   1699 ml    PHYSICAL EXAMINATION: General:  Frail, debilitated, chronically ill appearing male, NAD  Neuro:  Lethargic, arouses to loud voice, answers some yes/no questions, oriented to self, MAE, gen weakness, non focal HEENT:  Mm dry, no JVD  Cardiovascular:  s1s2 irreg, brady 50's  Lungs:  resps even, non labored, currently protecting airway, diminished bases  Abdomen:  Round, soft, +bs  Musculoskeletal:  Warm and dry, 1+ BLE edema    LABS:  CBC  Recent Labs Lab 10/02/2015 1130 09/28/2015 1132  WBC 29.8*  --   HGB 14.2 18.4*  HCT 52.1* 54.0*  PLT 19*  --    Coag's  Recent Labs Lab 09/26/2015 1130  APTT 47*  INR 1.82*   BMET  Recent Labs Lab 10/12/2015 1130 10/06/2015 1132  NA 146* 144  K 7.3* 6.7*  CL 111 110  CO2 14*  --   BUN 104* 113*  CREATININE 5.76* 5.30*  GLUCOSE 92 90   Electrolytes  Recent Labs Lab 10/11/2015 1130  CALCIUM 6.9*   Sepsis Markers  Recent Labs Lab 10/11/2015 1359  LATICACIDVEN 3.44*   ABG No results for input(s): PHART, PCO2ART, PO2ART in the last 168 hours. Liver Enzymes  Recent Labs Lab 09/22/2015 1130  AST 50*  ALT 52  ALKPHOS 64  BILITOT 1.4*  ALBUMIN 2.5*   Cardiac Enzymes No results for input(s): TROPONINI, PROBNP in the last 168 hours. Glucose  Recent Labs Lab 09/27/2015 1305  GLUCAP 168*     Imaging Ct Head Wo Contrast  09/21/2015  CLINICAL DATA:  altered mental status. EXAM: CT HEAD WITHOUT CONTRAST TECHNIQUE: Contiguous axial images were obtained from the base of the skull through the vertex without intravenous contrast. COMPARISON:  CT 07/02/2012 FINDINGS: There is a high-density extra-axial fluid collection extending along the high RIGHT cerebral convexity measuring 9 mm in depth (image 23, series 2). No midline shift. No hydrocephalus. No parenchymal hemorrhage. No intraventricular hemorrhage. Second extra-axial fluid collection anterior to the LEFT frontal lobe which is much smaller measuring 15 cm in length and 5 mm in depth. Even smaller extra-axial collection posterior to the LEFT parietal lobe measuring 5 mm. A third small extra-axial hemorrhage adjacent to the LEFT temporal lobe on image 25, series 2. There is generalized cortical atrophy. There is mild periventricular white matter hypodensities. New No evidence of skull fracture.  The basilar cisterns are  patent Paranasal sinuses and  mastoid air cells are clear. IMPRESSION: 1. Moderate size acute RIGHT subdural hematoma. 2. Three small LEFT extra-axial hemorrhages also likely representing subdural hematomas. 3. No midline shift.  Basilar cisterns are patent. Critical Value/emergent results were called by telephone at the time of interpretation on 10/13/2015 at 11:50 am to Dr. Dr. Silverio Decamp, who verbally acknowledged these results. Electronically Signed   By: Suzy Bouchard M.D.   On: 10/12/2015 11:54   Dg Chest Portable 1 View  10/04/2015  CLINICAL DATA:  Altered mental status. History of anemia, hypothyroidism, myeloproliferative disorder, COPD and diabetes. EXAM: PORTABLE CHEST 1 VIEW COMPARISON:  10/19/2015 and 09/17/2015 radiographs.  CT 09/17/2015. FINDINGS: 1338 hours. There is stable cardiomegaly. No significant reaccumulation of the right-sided pleural effusion is seen status post recent thoracentesis. There are  bilateral pleural effusions. There are worsening bilateral airspace opacities, slightly worse on the right. No evidence of pneumothorax. The bones appear unchanged. IMPRESSION: Interval worsening of bilateral airspace opacities, likely representing pulmonary edema. Infection not excluded. The pre-existing bilateral pleural effusions have not significantly changed. Electronically Signed   By: Richardean Sale M.D.   On: 10/15/2015 13:57     ASSESSMENT / PLAN:  NEUROLOGIC SDH - multiple acute - in setting fall with  Hx CVA  P:   Not surgical candidate per nsgy  Platelet, FFP as below  q1h neuro checks  Neuro following    PULMONARY Hx COPD  Pleural effusion  At risk for airway compromise  P:   Supplemental O2 as needed  F/u CXR   CARDIOVASCULAR Combined CHF - EF 25% (oct 2016) Hx PAF - not on anticoagulation r/t myeloproliferative disorder/thrombocytopenia  Hx HTN Hypotension  Bradycardia  P:  Check EKG, BNP  Low dose peripheral neo if needed for hypotension   RENAL AKI  Hyperkalemia  Lactic acidosis  P:   Repeat BMET now and q6  Kayexalate if K still high  Gentle volume with blood products - hold further volume otherwise  Trend lactate   GASTROINTESTINAL Severe protein calorie malnutrition   P:   NPO  F/u LFT's  HEMATOLOGIC Myeloproliferative disorder  Thrombocytopenia - plt =19 Leukocytosis  SDH  Coagulopathy  P:  Check fibrinogen  Trend CBC    INFECTIOUS No active issue  Recent HCAP P:   BCx2 11/16>>> UC 11/16>>>  Vanc 11/16>>> ceftaz 11/16>>>  ENDOCRINE DM Hypothyroid  P:   SSI  Synthroid - 1/2 dose IV   FAMILY  - Updates:   Family (2 daughters and son) updated at length 11/16 at bedside in ER with Dr. Ashok Cordia.   Pt has been declining over the last 3-4 months, now requiring 24hour care in SNF.  They understand the complexity of his multiple medical problems, now significantly complicated by multiple bilateral acute SDH.  Very poor  prognosis relayed to family.  They are appreciative and understand that he is very complicated with multiple comorbidities but, because the patient chose "full code" on admit to SNF, they are adamant that he remain full code for now including ETT and CPR/Defib if needed.  Will proceed for now with ICU admission, medical management, plt transfusion.  Will continue goals of care discussions and consult palliative care to assist as well.  As of now pt protecting airway, BP and K improving with gentle volume.    Nickolas Madrid, NP 10/16/2015  3:05 PM Pager: [phone] or [phone]

## 2015-10-05 NOTE — ED Notes (Addendum)
Pt awake and mumbling incomprehensible speech. Pulled bear hugger off. Critical care at bedside, in agreement to give FFP first, then abx and to d/c bear hugger.

## 2015-10-05 NOTE — ED Notes (Signed)
Condom catheter applied to pt 

## 2015-10-06 ENCOUNTER — Inpatient Hospital Stay (HOSPITAL_COMMUNITY): Payer: Medicare Other

## 2015-10-06 ENCOUNTER — Ambulatory Visit: Payer: Medicare Other

## 2015-10-06 ENCOUNTER — Other Ambulatory Visit: Payer: Medicare Other

## 2015-10-06 DIAGNOSIS — Z515 Encounter for palliative care: Secondary | ICD-10-CM

## 2015-10-06 DIAGNOSIS — Z7189 Other specified counseling: Secondary | ICD-10-CM

## 2015-10-06 DIAGNOSIS — L8990 Pressure ulcer of unspecified site, unspecified stage: Secondary | ICD-10-CM | POA: Insufficient documentation

## 2015-10-06 LAB — GLUCOSE, CAPILLARY
GLUCOSE-CAPILLARY: 130 mg/dL — AB (ref 65–99)
GLUCOSE-CAPILLARY: 170 mg/dL — AB (ref 65–99)
Glucose-Capillary: 89 mg/dL (ref 65–99)
Glucose-Capillary: 107 mg/dL — ABNORMAL HIGH (ref 65–99)
Glucose-Capillary: 148 mg/dL — ABNORMAL HIGH (ref 65–99)
Glucose-Capillary: 157 mg/dL — ABNORMAL HIGH (ref 65–99)

## 2015-10-06 LAB — BASIC METABOLIC PANEL
ANION GAP: 21 — AB (ref 5–15)
ANION GAP: 19 — AB (ref 5–15)
BUN: 98 mg/dL — ABNORMAL HIGH (ref 6–20)
BUN: 97 mg/dL — ABNORMAL HIGH (ref 6–20)
CALCIUM: 6.3 mg/dL — AB (ref 8.9–10.3)
CHLORIDE: 105 mmol/L (ref 101–111)
CO2: 14 mmol/L — AB (ref 22–32)
CO2: 15 mmol/L — ABNORMAL LOW (ref 22–32)
CREATININE: 5.39 mg/dL — AB (ref 0.61–1.24)
CREATININE: 5.25 mg/dL — AB (ref 0.61–1.24)
Calcium: 6.7 mg/dL — ABNORMAL LOW (ref 8.9–10.3)
Chloride: 104 mmol/L (ref 101–111)
GFR calc non Af Amer: 9 mL/min — ABNORMAL LOW (ref >60)
GFR calc non Af Amer: 9 mL/min — ABNORMAL LOW (ref >60)
GFR, EST AFRICAN AMERICAN: 10 mL/min — AB (ref >60)
GFR, EST AFRICAN AMERICAN: 10 mL/min — AB (ref >60)
Glucose, Bld: 156 mg/dL — ABNORMAL HIGH (ref 65–99)
Glucose, Bld: 182 mg/dL — ABNORMAL HIGH (ref 65–99)
Potassium: 6.1 mmol/L (ref 3.5–5.1)
Potassium: 7.4 mmol/L (ref 3.5–5.1)
SODIUM: 140 mmol/L (ref 135–145)
SODIUM: 138 mmol/L (ref 135–145)

## 2015-10-06 LAB — CBC
HCT: 45.4 % (ref 39.0–52.0)
HEMOGLOBIN: 12.4 g/dL — AB (ref 13.0–17.0)
MCH: 25.2 pg — AB (ref 26.0–34.0)
MCHC: 27.3 g/dL — ABNORMAL LOW (ref 30.0–36.0)
MCV: 92.1 fL (ref 78.0–100.0)
Platelets: 12 10*3/uL — CL (ref 150–400)
RBC: 4.93 MIL/uL (ref 4.22–5.81)
RDW: 27.1 % — ABNORMAL HIGH (ref 11.5–15.5)
WBC: 32 10*3/uL — ABNORMAL HIGH (ref 4.0–10.5)

## 2015-10-06 LAB — PREPARE PLATELET PHERESIS: Unit division: 0

## 2015-10-06 LAB — MAGNESIUM: Magnesium: 2.2 mg/dL (ref 1.7–2.4)

## 2015-10-06 LAB — PHOSPHORUS: Phosphorus: 6.6 mg/dL — ABNORMAL HIGH (ref 2.5–4.6)

## 2015-10-06 LAB — PREPARE FRESH FROZEN PLASMA: Unit division: 0

## 2015-10-06 LAB — LACTIC ACID, PLASMA
LACTIC ACID, VENOUS: 8.4 mmol/L — AB (ref 0.5–2.0)
Lactic Acid, Venous: 7.2 mmol/L (ref 0.5–2.0)

## 2015-10-06 LAB — PROCALCITONIN: Procalcitonin: 1.12 ng/mL

## 2015-10-06 MED ORDER — DEXTROSE 5 % IV SOLN
30.0000 ug/min | INTRAVENOUS | Status: DC
  Administered 2015-10-06: 120 ug/min via INTRAVENOUS
  Administered 2015-10-07 (×3): 200 ug/min via INTRAVENOUS
  Administered 2015-10-07: 160 ug/min via INTRAVENOUS
  Administered 2015-10-07: 200 ug/min via INTRAVENOUS
  Filled 2015-10-06 (×8): qty 4

## 2015-10-06 MED ORDER — SODIUM POLYSTYRENE SULFONATE 15 GM/60ML PO SUSP
30.0000 g | Freq: Once | ORAL | Status: AC
  Administered 2015-10-06: 30 g via RECTAL
  Filled 2015-10-06: qty 120

## 2015-10-06 MED ORDER — SODIUM BICARBONATE 8.4 % IV SOLN
INTRAVENOUS | Status: DC
  Administered 2015-10-06 – 2015-10-07 (×4): via INTRAVENOUS
  Filled 2015-10-06 (×8): qty 150

## 2015-10-06 MED ORDER — SODIUM POLYSTYRENE SULFONATE 15 GM/60ML PO SUSP
30.0000 g | Freq: Once | ORAL | Status: DC
  Filled 2015-10-06: qty 120

## 2015-10-06 MED ORDER — LEVALBUTEROL HCL 0.63 MG/3ML IN NEBU: 0.6300 mg | INHALATION_SOLUTION | Freq: Four times a day (QID) | RESPIRATORY_TRACT | Status: DC | PRN

## 2015-10-06 MED ORDER — CETYLPYRIDINIUM CHLORIDE 0.05 % MT LIQD: 7.0000 mL | Freq: Two times a day (BID) | OROMUCOSAL | Status: DC

## 2015-10-06 MED ORDER — NICOTINE 14 MG/24HR TD PT24
14.0000 mg | MEDICATED_PATCH | Freq: Every day | TRANSDERMAL | Status: DC
  Administered 2015-10-06 – 2015-10-07 (×2): 14 mg via TRANSDERMAL
  Filled 2015-10-06 (×2): qty 1

## 2015-10-06 MED ORDER — CHLORHEXIDINE GLUCONATE 0.12 % MT SOLN: 15.0000 mL | Freq: Two times a day (BID) | OROMUCOSAL | Status: DC

## 2015-10-06 MED ORDER — SODIUM CHLORIDE 0.9 % IV BOLUS (SEPSIS)
1000.0000 mL | Freq: Once | INTRAVENOUS | Status: AC
  Administered 2015-10-06: 1000 mL via INTRAVENOUS

## 2015-10-06 MED ORDER — SODIUM CHLORIDE 0.9 % IV SOLN
INTRAVENOUS | Status: DC
  Filled 2015-10-06: qty 1000

## 2015-10-06 NOTE — Progress Notes (Signed)
CRITICAL VALUE ALERT  Critical value received:  K 6.1  Date of notification:  10/06/15  Time of notification:  [id number]  Critical value read back:yes  Nurse who received alert:  Regino Schultze RN  MD notified (1st page):  MD on unit  Time of first page:  Board rounds  2  MD notified (2nd page):  Time of second page:  Responding MD:  Halford Chessman  Time MD responded:  Board rounds 1130

## 2015-10-06 NOTE — Progress Notes (Signed)
INTERVAL PROGRESS NOTE  One of patient's daughter's in the room, frustrated that he seems so uncomfortable. She was not in the goals of care meeting. She requested a nicotine patch as she believes this will improve his uncomfortableness. He seems a little more responsive this afternoon. Will squeeze my hand on the left, not on the right. He can close his eyes on command and stick out his tongue. He continues to writhe in pain and does not speak.   Spoke with Palliative NP who knows the family from previous encounters. States they wanted all aggressive measures done. Unfortunately their goals of making him comfortable and being as aggressive as possible are not realistic, as fentanyl will worsen his hypotension (he continues to require increased doses of neo) and worsen his respiratory status.  Archie Patten, MD [facility] Resident  10/06/2015, 2:04 PM

## 2015-10-06 NOTE — Progress Notes (Signed)
CRITICAL VALUE ALERT  Critical value received:  K: 7.4, Calcium 6.3, Lactic acid 8.4  Date of notification:  10/06/15  Time of notification:  1630  Critical value read back: yes  Nurse who received alert:  Regino Schultze RN  MD notified (1st page):  Warren Lacy MD  Time of first page:  68  MD notified (2nd page):  Time of second page:  Responding MD:  Warren Lacy MD  Time MD responded:  1630

## 2015-10-06 NOTE — Consult Note (Addendum)
WOC wound consult note Reason for Consult: Consult requested for bilat buttocks/sacrum. Pt is frequently incontinent of stool and it is becoming trapped underneath the current foam dressings.  Wound type:  Sacrum .2X.1X.1cm stage 2 pressure injury, red and moist, small amt yellow drainage Pressure Ulcer POA: Yes Measurement: Bilat lower buttocks with multiple patchy areas of full thickness wounds; each approx .2X.2X.1cm, red and moist round wounds with small amt yellow drainage.  Appearance is consistent with moisture associated skin damage.  Pt has a foley to contain urine at this time. There are NOT pressure ulcers, and were present on admission. There is a large bruised area to left flank, and left anterior lower hip and abd, dark reddish purple   Dressing procedure/placement/frequency: Leave foam dressings off and apply barrier cream to protect skin and repel moisture.  Pt is on a Sport low airloss bed to reduce pressure.  Family at bedside to assess wounds and discuss plan of care. Please re-consult if further assistance is needed.  Thank-you,  Julien Girt MSN, [place], [place], [place], [place]

## 2015-10-06 NOTE — Progress Notes (Signed)
Critical labs to Dr Melvyn Novas  Lactate-7.2 K-7.4

## 2015-10-06 NOTE — Progress Notes (Signed)
CRITICAL VALUE ALERT  Critical value received:  Lactic acid 7.2  Date of notification:  10/06/15  Time of notification:  1600  Critical value read back: yes  Nurse who received alert:  Balinda Quails RN  MD notified (1st page):  Elink RN  Time of first page:  1615  MD notified (2nd page):  Time of second page:  Responding MD:  Warren Lacy MD aware  Time MD responded:  1615

## 2015-10-06 NOTE — Progress Notes (Signed)
SLP Cancellation Note  Patient Details Name: Kyle Heath MRN: [id number] DOB: 1926/12/15   Cancelled treatment:       Reason Eval/Treat Not Completed: Medical issues which prohibited therapy   Lanier Ensign 10/06/2015, 10:18 AM

## 2015-10-06 NOTE — Progress Notes (Signed)
Critical lab to Dr Melvyn Novas Lactate-8.4

## 2015-10-06 NOTE — Progress Notes (Signed)
Pt sister confused about patient's code status and asked if it could be changed as she wanted him to receive compressions if needed. The sister called the POA; I spoke to the POA and explained that according to the patient's chart, he was a DNR. Family discussed the matter with each other via telephone- family decided to keep the pt DNR. This was witnessed/confirmed with Allegra Grana RN.

## 2015-10-06 NOTE — Progress Notes (Signed)
CRITICAL VALUE ALERT  Critical value received: Potassium  Date of notification: 10/06/15  Time of notification:  0543  Critical value read back:Yes  Nurse who received alert:  Deboraha Sprang, RN  MD notified (1st page):  M[place]  Time of first page: [phone]

## 2015-10-06 NOTE — Progress Notes (Signed)
Unable to obtain O2 saturation reading on pt for majority of shift. Multiple sites and types of probes attempted, reading unobtainable. Portable O2 sat monitor attempted as well with no result.  At 1800, plastic probe tried on pinky finger of left hand. O2 sat readable at times.

## 2015-10-06 NOTE — Progress Notes (Signed)
CRITICAL VALUE ALERT  Critical value received:  Potassium 6.2 and Lactic acid 4.8  Date of notification:  10/10/2015  Time of notification:  2106  Critical value read back:Yes  Nurse who received alert:  Deboraha Sprang, RN   MD notified (1st page):  Dr. Oletta Darter  Time of first page: 2106

## 2015-10-06 NOTE — Consult Note (Signed)
Consultation Note Date: 10/06/2015   Patient Name: Kyle Heath  DOB: 03/03/1927  MRN: [id number]  Age / Sex: 81 y.o., male  PCP: Foye Spurling, MD Referring Physician: Javier Glazier, MD  Reason for Consultation: Establishing goals of care    Clinical Assessment/Narrative:   Unfortunate 81 year old male with multiple medical problems including chronic systolic congestive heart failure and myeloproliferative disorder requiring blood product transfusions with chronic thrombocytopenia. Presented to Hospital after a fall at his skilled nursing facility. CT the head shows bilateral subdural hematomas. 1. Bilateral subdural hematomas: Unofficial consult by neurosurgery reviewed. Hematomas nonoperable. Neurochecks every 2 hours. Checking urine drug screen given fall. Neurology following. 2. Coagulopathy: Checking fibrinogen. Transfusing 1 unit FFP. Plan to repeat coags after FFP. 3. Thrombocytopenia: Secondary to myeloproliferative disorder. transfusing platelets. Plan to repeat CBC after remaining blood products were transfused. 4. Acute renal failure: Unclear etiology but would suspect secondary to poor forward flow state. Continuing to trend urine output along with renal function with daily BUN/creatinine. Checking UA. 5. Hyperkalemia: Improving. Likely secondary to acute renal failure. Holding off on dialysis at this time. Repeat electrolyte panel in the morning. Monitor patient on telemetry. Continuing insulin & calcium gluconate IV. 6. Possible sepsis: Unclear Source. Blood and urine cultures obtained in the ED. Initiated broad-spectrum antibiotic coverage with vancomycin & Tressie Ellis. Procalcitonin algorithm. 7. Metabolic/lactic acidosis: Continuing to trend. 8. Hypotension: Multifactorial. Likely secondary to underlying systolic congestive heart failure with possible septic shock. Holding off on central line  placement for now. Continuing peripheral vasopressor therapy with family aware of potential for limb/digit necrosis.  Poor prognosis, family faced with advanced directive decisions     This NP Wadie Lessen reviewed medical records, received report from team, assessed the patient and then meet at the patient's bedside along with his three daughter, SO and sister  to discuss diagnosis,  prognosis, GOC, EOL wishes disposition and options.  Continued conversation regarding medical situation and treatment options.  A detailed discussion was had today regarding advanced directives.  Concepts specific to code status, artifical feeding and hydration, continued IV antibiotics and rehospitalization was had.  The difference between a aggressive medical intervention path  and a palliative comfort care path for this patient at this time was had.  Values and goals of care important to patient and family were attempted to be elicited.   Natural trajectory and expectations at EOL were discussed.  Questions and concerns addressed.   Family encouraged to call with questions or concerns.  PMT will continue to support holistically.   Primary Decision Maker:  Two daughters named in [place], documented on last admission    HCPOA: yes    SUMMARY OF RECOMMENDATIONS   - family strongly encouraged to consider a shift  to a comfort approach to care understanding the overall poor prognosis, with limited viable medical intervetnions -DNR/DNI   Code Status/Advance Care Planning:  DNR      Code Status Orders        Start     Ordered   09/24/2015 1612  Do not attempt resuscitation (DNR)   Continuous     09/20/2015 1611      Other Directives:Advanced Directive  Symptom Management:    Consider utilization of low dose opioids for pain and dyspnea within medical treatment plan  Palliative Prophylaxis:    Aspiration, Bowel Regimen, Delirium Protocol, Frequent Pain Assessment and Palliative Wound  Care    Psycho-social/Spiritual:  Support System: Strong Desire for further Chaplaincy support:no   Prognosis: Hours -  Days  Discharge Planning: Anticipated Hospital Death   Chief Complaint/ Primary Diagnoses: Present on Admission:  . SDH (subdural hematoma) (HCC)  I have reviewed the medical record, interviewed the patient and family, and examined the patient. The following aspects are pertinent.  Past Medical History  Diagnosis Date  . Anemia   . Hypothyroidism   . Hypertension   . Myeloproliferative disorder ([place]) 06/30/2012  . Paroxysmal atrial flutter ([place]) 2014  . Chronic combined systolic and diastolic CHF (congestive heart failure) ([place])     a. EF in 2014 55-60% b. EF in 08/2015 25-30%  . Gout   . BPH (benign prostatic hypertrophy)   . COPD (chronic obstructive pulmonary disease) ([place])   . Bone marrow disorder   . CVA (cerebral vascular accident) ([place])   . Type II diabetes mellitus ([place])   . History of blood transfusion "several"  . Dementia   . Arthritis     "mostly in joints" (09/07/2015)  . Use of cane as ambulatory aid    Social History   Social History  . Marital Status: Widowed    Spouse Name: N/A  . Number of Children: N/A  . Years of Education: N/A   Social History Main Topics  . Smoking status: Former Smoker    Types: Cigarettes, Cigars  . Smokeless tobacco: Current User    Types: Chew     Comment: 09/07/2015 "don't know of any cigarettes/cigar smoking"  . Alcohol Use: No  . Drug Use: No  . Sexual Activity: No   Other Topics Concern  . Not on file   Social History Narrative   Lives in a house with his daughter.  Does not use a cane or walker.   He drives (but is not supposed to).              Family History  Problem Relation Age of Onset  . Heart disease Neg Hx   . Heart failure Neg Hx   . Prostate cancer     Scheduled Meds: . antiseptic oral rinse  7 mL Mouth Rinse q12n4p  . cefTAZidime (FORTAZ)  IV  2 g Intravenous Q48H  .  chlorhexidine  15 mL Mouth Rinse BID  . insulin aspart  0-15 Units Subcutaneous 6 times per day  . levothyroxine  44 mcg Intravenous Daily  . nicotine  14 mg Transdermal Daily  . pantoprazole (PROTONIX) IV  40 mg Intravenous QHS  . sodium chloride  1,000 mL Intravenous Once  . vancomycin  1,000 mg Intravenous Q48H   Continuous Infusions: . phenylephrine (NEO-SYNEPHRINE) Adult infusion 130 mcg/min (10/06/15 1257)  .  sodium bicarbonate  infusion 1000 mL 125 mL/hr at 10/06/15 1201   PRN Meds:.sodium chloride, levalbuterol, ondansetron (ZOFRAN) IV Medications Prior to Admission:  Prior to Admission medications   Medication Sig Start Date End Date Taking? Authorizing Provider  acetaminophen (TYLENOL) 500 MG tablet Take 500 mg by mouth every 6 (six) hours as needed for mild pain.   Yes Historical Provider, MD  aspirin 81 MG tablet Take 1 tablet (81 mg total) by mouth every other day. 09/21/15  Yes Theodis Blaze, MD  bisacodyl (DULCOLAX) 10 MG suppository Place 10 mg rectally as needed for moderate constipation.   Yes Historical Provider, MD  carvedilol (COREG) 25 MG tablet Take 25 mg by mouth 2 (two) times daily with a meal.  08/07/14  Yes Historical Provider, MD  furosemide (LASIX) 40 MG tablet Take 1 tablet (40 mg total) by mouth daily. 09/21/15  Yes Theodis Blaze, MD  Influenza vac split quadrivalent PF (FLUARIX) 0.5 ML injection Inject 0.5 mLs into the muscle once.   Yes Historical Provider, MD  levalbuterol (XOPENEX) 0.63 MG/3ML nebulizer solution Take 3 mLs (0.63 mg total) by nebulization every 6 (six) hours as needed for wheezing or shortness of breath. 09/21/15  Yes Theodis Blaze, MD  levothyroxine (SYNTHROID, LEVOTHROID) 88 MCG tablet Take 88 mcg by mouth daily before breakfast.    Yes Historical Provider, MD  magnesium hydroxide (MILK OF MAGNESIA) 400 MG/5ML suspension Take 30 mLs by mouth daily as needed for mild constipation.   Yes Historical Provider, MD  mirtazapine (REMERON) 7.5 MG  tablet Take 7.5 mg by mouth at bedtime.   Yes Historical Provider, MD  Multiple Vitamin (MULTIVITAMIN WITH MINERALS) TABS Take 1 tablet by mouth daily.   Yes Historical Provider, MD  potassium chloride SA (K-DUR,KLOR-CON) 20 MEQ tablet Take 1 tablet (20 mEq total) by mouth daily. 01/12/15  Yes Wyatt Portela, MD  Sodium Phosphates (RA SALINE ENEMA) 19-7 GM/118ML ENEM Place 1 application rectally once.   Yes Historical Provider, MD  ULORIC 80 MG TABS Take 1 tablet by mouth daily.  04/21/15  Yes Historical Provider, MD   No Known Allergies  Review of Systems  Unable to perform ROS   Physical Exam  Constitutional: He appears lethargic. He has a sickly appearance. He appears ill. He appears distressed.  HENT:  Mouth/Throat: Mucous membranes are dry.  Cardiovascular: Tachycardia present.   Respiratory: Tachypnea noted.  Neurological: He appears lethargic.  Skin: Skin is warm and dry.    Vital Signs: BP 94/61 mmHg  Pulse 41  Temp(Src) 97 F (36.1 C) (Oral)  Resp 17  Wt 72.6 kg (160 lb 0.9 oz)  SpO2 100%  SpO2: SpO2: 100 % O2 Device:SpO2: 100 % O2 Flow Rate: .   IO: Intake/output summary:  Intake/Output Summary (Last 24 hours) at 10/06/15 1346 Last data filed at 10/06/15 0900  Gross per 24 hour  Intake 3863.75 ml  Output    385 ml  Net 3478.75 ml    LBM:   Baseline Weight: Weight: 72.6 kg (160 lb 0.9 oz) Most recent weight: Weight: 72.6 kg (160 lb 0.9 oz)      Palliative Assessment/Data:    Additional Data Reviewed:  CBC:    Component Value Date/Time   WBC 32.6* 10/06/2015 1035   WBC 6.6 08/25/2015 0948   HGB 12.9* 10/06/2015 1035   HGB 12.9* 08/25/2015 0948   HCT 47.6 10/06/2015 1035   HCT 45.7 08/25/2015 0948   PLT 14* 10/06/2015 1035   PLT 84* 08/25/2015 0948   MCV 92.1 10/06/2015 1035   MCV 89.4 08/25/2015 0948   NEUTROABS 22.4* 10/01/2015 1130   NEUTROABS 4.8 08/25/2015 0948   LYMPHSABS 3.0 09/30/2015 1130   LYMPHSABS 1.0 08/25/2015 0948   MONOABS 2.4*  09/28/2015 1130   MONOABS 0.6 08/25/2015 0948   EOSABS 0.0 10/11/2015 1130   EOSABS 0.1 08/25/2015 0948   BASOSABS 0.6* 10/16/2015 1130   BASOSABS 0.1 08/25/2015 0948   Comprehensive Metabolic Panel:    Component Value Date/Time   NA 140 10/06/2015 1035   NA 139 05/04/2015 0905   K 6.1* 10/06/2015 1035   K 4.0 05/04/2015 0905   CL 105 10/06/2015 1035   CO2 14* 10/06/2015 1035   CO2 23 05/04/2015 0905   BUN 98* 10/06/2015 1035   BUN 17.9 05/04/2015 0905   CREATININE 5.39* 10/06/2015 1035   CREATININE  1.30* 09/02/2015 1026   CREATININE 1.2 05/04/2015 0905   GLUCOSE 156* 10/06/2015 1035   GLUCOSE 140 05/04/2015 0905   CALCIUM 6.7* 10/06/2015 1035   CALCIUM 8.4 05/04/2015 0905   AST 50* 10/01/2015 1130   AST 21 05/04/2015 0905   ALT 52 10/17/2015 1130   ALT 29 05/04/2015 0905   ALKPHOS 64 10/18/2015 1130   ALKPHOS 79 05/04/2015 0905   BILITOT 1.4* 09/20/2015 1130   BILITOT 0.59 05/04/2015 0905   PROT 5.5* 10/04/2015 1130   PROT 6.8 05/04/2015 0905   ALBUMIN 2.5* 09/22/2015 1130   ALBUMIN 3.1* 05/04/2015 0905     Time In: [id number] Time Out: 1400 Time Total: 75 min Greater than 50%  of this time was spent counseling and coordinating care related to the above assessment and plan.  Discussed with Dr Halford Chessman and Dr Ashok Cordia  Signed by: Wadie Lessen, NP  Knox Royalty, NP  10/06/2015, 1:46 PM  Please contact Palliative Medicine Team phone at [phone] for questions and concerns.

## 2015-10-06 NOTE — Progress Notes (Addendum)
Pt given 1mg  Haldol as ordered. Verbal Order by Georgann Housekeeper, NP to give the rest of the Haldol 4mg 

## 2015-10-06 NOTE — Progress Notes (Signed)
Reader Progress Note Patient Name: Kyle Heath DOB: [birth year] MRN: [id number]   Date of Service  10/06/2015  HPI/Events of Note  K=6.7  eICU Interventions  Kayexalate 30mg  x 1     Intervention Category Intermediate Interventions: Electrolyte abnormality - evaluation and management  Bettejane Leavens 10/06/2015, 5:52 AM

## 2015-10-06 NOTE — Progress Notes (Signed)
STROKE TEAM PROGRESS NOTE   HISTORY Kyle Heath is an 81 y.o. male presenting to hospital as code stroke. The history is not fully clear but from EMS there was a change in mental status and EMS was called. He did sustain a fall yesterday per staff. CT head obtained and shows right SDH. In the ED he is able to state his name and follow commands but is very dysarthric. Patient has been seen by palliative in the past but hospice care has not been involved.   Date last known well: Date: 10/01/2015 Time last known well: Time: 10:40 tPA Given: No: SDH   SUBJECTIVE (INTERVAL HISTORY) His daughters and girlfriend are at the bedside.  He is in the bed, agitated, not talking. I obtained detailed history from family. He has mild dementia at baseline and has had significant multiple problems recently including hospitalization 2 weeks ago   OBJECTIVE Temp:  [94.3 F (34.6 C)-98.9 F (37.2 C)] 97 F (36.1 C) (11/17 0809) Pulse Rate:  [25-156] 25 (11/17 0930) Cardiac Rhythm:  [-] Normal sinus rhythm;Sinus bradycardia (11/17 0845) Resp:  [10-32] 23 (11/17 0945) BP: (59-132)/(35-94) 96/49 mmHg (11/17 0945) SpO2:  [78 %-100 %] 100 % (11/17 0545) Weight:  [72.6 kg (160 lb 0.9 oz)] 72.6 kg (160 lb 0.9 oz) (11/17 0500)  CBC:  Recent Labs Lab 10/09/2015 1130  10/09/2015 1942 10/06/15 0441  WBC 29.8*  --  32.0* 30.1*  NEUTROABS 22.4*  --   --   --   HGB 14.2  < > 12.4* 12.4*  HCT 52.1*  < > 45.4 47.1  MCV 92.9  --  92.1 92.4  PLT 19*  --  12* 11*  < > = values in this interval not displayed.  Basic Metabolic Panel:   Recent Labs Lab 10/15/2015 1942 10/06/15 0441  NA 145 141  K 6.2* 6.7*  CL 108 108  CO2 19* 16*  GLUCOSE 148* 93  BUN 100* 99*  CREATININE 5.59* 5.38*  CALCIUM 6.9* 6.6*  MG  --  2.2  PHOS  --  6.6*   Urine Drug Screen:     Component Value Date/Time   LABOPIA NONE DETECTED 09/28/2015 1926   COCAINSCRNUR NONE DETECTED 09/24/2015 1926   LABBENZ NONE DETECTED 09/26/2015  1926   AMPHETMU NONE DETECTED 10/04/2015 1926   THCU NONE DETECTED 10/01/2015 1926   LABBARB NONE DETECTED 10/01/2015 1926      IMAGING  Ct Head Wo Contrast 10/08/2015  1. Moderate size acute RIGHT subdural hematoma. 2. Three small LEFT extra-axial hemorrhages also likely representing subdural hematomas. 3. No midline shift.  Basilar cisterns are patent.   Dg Chest Port 1 View 10/06/2015  Stable diffuse bilateral lung opacities are noted concerning for edema or pneumonia, with associated pleural effusions, right greater than left.   Dg Chest Portable 1 View 10/11/2015  Interval worsening of bilateral airspace opacities, likely representing pulmonary edema. Infection not excluded. The pre-existing bilateral pleural effusions have not significantly changed.     PHYSICAL EXAM Frail elderly male who is restless and in restraints. . Afebrile. Head is nontraumatic. Neck is supple without bruit.    Cardiac exam no murmur or gallop. Lungs are clear to auscultation. Distal pulses are well felt. Neurological Exam :  Drowsy opens eyes to stimulation but nor following commands consistently. Pupils irregular reactive.cannot test vision acuity, fieldsds or Funduscopic exam. Face symmetric. Not speaking. Moves all 4 limbs but left side less than right. Withdraws to pain left more than right.  DTRs symmetric. Plantars downgoing bilaterally. Gait deferred. ASSESSMENT/PLAN [name] is a 81 y.o. male with history of multiple medical problems presenting with altered mental status, hx fall at the nursing home.   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 Can consider resuming once hemorrhage has resolved.  Therapy  recommendations:  pending   Disposition:  pending  (at [facility] 2 wk, went there after hospitalization for "fluid in his lungs") anticipate he will be even more dependent at time of discharge. Palliative care consideration suggested and recommended to family.  Baseline dementia  Present  x 3-4 years, could dress self and do ADLs prior to initial   Hospital day # [place] for Pager information 10/06/2015 10:28 AM  I have personally examined this patient, reviewed notes, independently viewed imaging studies, participated in medical decision making and plan of care. I have made any additions or clarifications directly to the above note. Agree with note above.  He presented with altered mental status following a fall and CT scan shows an acute right and chronic left subdural hematoma. He is at risk for neurological worsening, seizures and needs close neurological monitoring. I had a long discussion with patient's multiple family members at the bedside for regards to his prognosis may poor given underlying dementia and significant acute medical problems. Palliative care has been consulted and I find it to be appropriate.  Antony Contras, MD Medical Director [facility] Pager: [phone] 10/06/2015 4:10 PM    To contact Stroke Continuity provider, please refer to [email or web address] After hours, contact General Neurology \

## 2015-10-06 NOTE — Progress Notes (Signed)
PULMONARY / CRITICAL CARE MEDICINE   Name: Kyle Heath MRN: [id number] DOB: January 29, 1927    ADMISSION DATE:  10/06/2015  REFERRING MD :  EDP  CHIEF COMPLAINT:  SDH  INITIAL PRESENTATION: 81yo male with multiple medical problems including HTN, myeloproliferative disorder, AFib, combined heart failure, COPD, DM.  Pt with multiple recent admissions for acute on chronic heart failure, thrombocytopenia, AKI, failure to thrive.  Presented 11/16 with AMS after a fall at SNF.  Code stroke called initially and CT head revealed multiple acute SDH.  PCCM asked to admit to ICU.   STUDIES:  CT head 11/16>>> moderate acute R SDH, 3 small L SDH, No midline shift.  CXR 11/7>>> Stable diffuse bilateral lung opacities are noted concerning for edema or pneumonia, with associated pleural effusions, right greater than left.  SIGNIFICANT EVENTS: 11/16>> Fall resulting with bilateral subdural hematomas non-operative per neurosurgery.   SUBJECTIVE:  Overnight the patient was noted to be agitated, requiring sitter for safety. He continued to be hyperkalemic (thought to be secondary to AKI) requiring kayexalate, IV calcium gluconate, and insulin. Lactic acid was also elevated to 4.8. Patient unable to answer questions.   VITAL SIGNS: Temp:  [94.3 F (34.6 C)-98.9 F (37.2 C)] 97.2 F (36.2 C) (11/17 0339) Pulse Rate:  [30-156] 57 (11/17 0545) Resp:  [10-32] 20 (11/17 0645) BP: (59-132)/(35-94) 100/67 mmHg (11/17 0645) SpO2:  [78 %-100 %] 100 % (11/17 0545) Weight:  [160 lb 0.9 oz (72.6 kg)] 160 lb 0.9 oz (72.6 kg) (11/17 0500) HEMODYNAMICS:   VENTILATOR SETTINGS:   INTAKE / OUTPUT:  Intake/Output Summary (Last 24 hours) at 10/06/15 0750 Last data filed at 10/06/15 0600  Gross per 24 hour  Intake 5061.5 ml  Output    385 ml  Net 4676.5 ml    PHYSICAL EXAMINATION: General:  Frail, debilitated, chronically ill appearing male, writhing around then intermittently calm.   Neuro: Does not answer  questions. Does not follow commands. Does open eyes intermittently  HEENT:  Very dry mucous membranes Cardiovascular: Bradycardia, regular rhythm, no m/r/g noted.  Lungs:  Non-labored, clear.  Abdomen:  +BS, soft, non-distended, non-tender Musculoskeletal:  Warm and dry, Trace bilateral LE edema    LABS:  CBC  Recent Labs Lab 10/18/2015 1130 10/06/2015 1132 09/27/2015 1942 10/06/15 0441  WBC 29.8*  --  32.0* 30.1*  HGB 14.2 18.4* 12.4* 12.4*  HCT 52.1* 54.0* 45.4 47.1  PLT 19*  --  12* 11*   Coag's  Recent Labs Lab 10/17/2015 1130  APTT 47*  INR 1.82*   BMET  Recent Labs Lab 10/06/2015 1130 10/01/2015 1132 10/06/2015 1942 10/06/15 0441  NA 146* 144 145 141  K 7.3* 6.7* 6.2* 6.7*  CL 111 110 108 108  CO2 14*  --  19* 16*  BUN 104* 113* 100* 99*  CREATININE 5.76* 5.30* 5.59* 5.38*  GLUCOSE 92 90 148* 93   Electrolytes  Recent Labs Lab 10/16/2015 1130 10/18/2015 1942 10/06/15 0441  CALCIUM 6.9* 6.9* 6.6*  MG  --   --  2.2  PHOS  --   --  6.6*   Sepsis Markers  Recent Labs Lab 10/06/2015 1359 10/08/2015 1942 10/06/15 0441  LATICACIDVEN 3.44* 4.8*  --   PROCALCITON  --  1.09 1.12   ABG No results for input(s): PHART, PCO2ART, PO2ART in the last 168 hours. Liver Enzymes  Recent Labs Lab 09/29/2015 1130  AST 50*  ALT 52  ALKPHOS 64  BILITOT 1.4*  ALBUMIN 2.5*   Cardiac Enzymes  No results for input(s): TROPONINI, PROBNP in the last 168 hours. Glucose  Recent Labs Lab 10/03/2015 1305 10/08/2015 1722 10/04/2015 1920 10/14/2015 2124 09/29/2015 2345  GLUCAP 168* 51* 136* 146* 130*    Imaging Ct Head Wo Contrast  09/27/2015  CLINICAL DATA:  altered mental status. EXAM: CT HEAD WITHOUT CONTRAST TECHNIQUE: Contiguous axial images were obtained from the base of the skull through the vertex without intravenous contrast. COMPARISON:  CT 07/02/2012 FINDINGS: There is a high-density extra-axial fluid collection extending along the high RIGHT cerebral convexity measuring 9  mm in depth (image 23, series 2). No midline shift. No hydrocephalus. No parenchymal hemorrhage. No intraventricular hemorrhage. Second extra-axial fluid collection anterior to the LEFT frontal lobe which is much smaller measuring 15 cm in length and 5 mm in depth. Even smaller extra-axial collection posterior to the LEFT parietal lobe measuring 5 mm. A third small extra-axial hemorrhage adjacent to the LEFT temporal lobe on image 25, series 2. There is generalized cortical atrophy. There is mild periventricular white matter hypodensities. New No evidence of skull fracture.  The basilar cisterns are patent Paranasal sinuses and  mastoid air cells are clear. IMPRESSION: 1. Moderate size acute RIGHT subdural hematoma. 2. Three small LEFT extra-axial hemorrhages also likely representing subdural hematomas. 3. No midline shift.  Basilar cisterns are patent. Critical Value/emergent results were called by telephone at the time of interpretation on 10/06/2015 at 11:50 am to Dr. Dr. Silverio Decamp, who verbally acknowledged these results. Electronically Signed   By: Suzy Bouchard M.D.   On: 09/26/2015 11:54   Dg Chest Port 1 View  10/06/2015  CLINICAL DATA:  Shortness of breath, pleural effusion. EXAM: PORTABLE CHEST 1 VIEW COMPARISON:  October 05, 2015. FINDINGS: Stable cardiomegaly. Stable diffuse bilateral lung opacities are noted concerning for edema or pneumonia. Stable bilateral pleural effusions are noted with right greater than left. No pneumothorax is noted. Bony thorax is unremarkable. IMPRESSION: Stable diffuse bilateral lung opacities are noted concerning for edema or pneumonia, with associated pleural effusions, right greater than left. Electronically Signed   By: Marijo Conception, M.D.   On: 10/06/2015 07:15   Dg Chest Portable 1 View  10/02/2015  CLINICAL DATA:  Altered mental status. History of anemia, hypothyroidism, myeloproliferative disorder, COPD and diabetes. EXAM: PORTABLE CHEST 1 VIEW  COMPARISON:  10/19/2015 and 09/17/2015 radiographs.  CT 09/17/2015. FINDINGS: 1338 hours. There is stable cardiomegaly. No significant reaccumulation of the right-sided pleural effusion is seen status post recent thoracentesis. There are bilateral pleural effusions. There are worsening bilateral airspace opacities, slightly worse on the right. No evidence of pneumothorax. The bones appear unchanged. IMPRESSION: Interval worsening of bilateral airspace opacities, likely representing pulmonary edema. Infection not excluded. The pre-existing bilateral pleural effusions have not significantly changed. Electronically Signed   By: Richardean Sale M.D.   On: 09/20/2015 13:57     ASSESSMENT / PLAN:  NEUROLOGIC SDH - multiple acute - in setting fall Hx CVA  P:   Not surgical candidate per neurosurgery Platelet, FFP as below  Neuro checks q2hrs  Neuro following  Palliative consulted, family aware and agreeable (prognosis poor)  PULMONARY Hx COPD  Pleural effusion  At risk for airway compromise/aspiration P:   Supplemental O2 as needed  Xopenex PRN Patient is a DNR/DNI  CARDIOVASCULAR Combined CHF - EF 25% (oct 2016) Hx PAF - not on anticoagulation r/t myeloproliferative disorder/thrombocytopenia  Hx HTN Hypotension on admission- likely from systolic CHF with possible septic shock component  Bradycardia  BNP elevated  to 1073 (however unsure what his baseline is as this is lower than previous)  P:  Neo for pressure Give gentle hydration while NPO  Holding home coreg    RENAL AKI  Hyperkalemia  Lactic acidosis  P:   Hydration with D5W and 3amps bicarb S/p Kayexalate, calcium gluconate, and insulin Repeat BMET now and q6  Trend lactate: worsening  GASTROINTESTINAL Severe protein calorie malnutrition  P:   NPO  Consider nutrition consult if improving mental status (would most likely require feeding tube)  HEMATOLOGIC Myeloproliferative disorder (myelofibrosis) Thrombocytopenia  - plt =12 Leukocytosis  SDH  Coagulopathy  P:  S/p 1u FFP and 1 unit platelets Fibrinogen low, platelet low (but stable) Trend CBC  Aranesp injections in the outpatient setting q2wks  INFECTIOUS Recent HCAP Concerns for infection given leukocytosis and hypothermia  Lactic acid continuing to rise P:   BCx2 11/16>>> UC 11/16>>>  Vanc 11/16>>> ceftaz 11/16>>> Procalcitonin algorithm  Continue to trent lactic acid   ENDOCRINE DM Hypothyroid  P:   SSI  Synthroid - 1/2 dose IV   FAMILY  - Updates:   Updated 2 daughters at bedised  - Palliative consulted, family aware    Archie Patten, MD [facility] Family Medicine Resident  10/06/2015, 8:15 AM

## 2015-10-07 ENCOUNTER — Inpatient Hospital Stay (HOSPITAL_COMMUNITY): Payer: Medicare Other

## 2015-10-07 LAB — CBC
HCT: 47.6 % (ref 39.0–52.0)
HCT: 44.7 % (ref 39.0–52.0)
HEMATOCRIT: 47.1 % (ref 39.0–52.0)
HEMOGLOBIN: 12.9 g/dL — AB (ref 13.0–17.0)
Hemoglobin: 12.4 g/dL — ABNORMAL LOW (ref 13.0–17.0)
Hemoglobin: 12.1 g/dL — ABNORMAL LOW (ref 13.0–17.0)
MCH: 24.3 pg — AB (ref 26.0–34.0)
MCH: 25 pg — AB (ref 26.0–34.0)
MCH: 24.9 pg — AB (ref 26.0–34.0)
MCHC: 26.3 g/dL — AB (ref 30.0–36.0)
MCHC: 27.1 g/dL — ABNORMAL LOW (ref 30.0–36.0)
MCHC: 27.1 g/dL — AB (ref 30.0–36.0)
MCV: 92.4 fL (ref 78.0–100.0)
MCV: 92.1 fL (ref 78.0–100.0)
MCV: 92 fL (ref 78.0–100.0)
PLATELETS: 14 10*3/uL — AB (ref 150–400)
PLATELETS: 24 10*3/uL — AB (ref 150–400)
Platelets: 11 10*3/uL — CL (ref 150–400)
RBC: 5.1 MIL/uL (ref 4.22–5.81)
RBC: 5.17 MIL/uL (ref 4.22–5.81)
RBC: 4.86 MIL/uL (ref 4.22–5.81)
RDW: 27.2 % — AB (ref 11.5–15.5)
RDW: 27.3 % — ABNORMAL HIGH (ref 11.5–15.5)
RDW: 27.1 % — AB (ref 11.5–15.5)
WBC: 30.1 10*3/uL — ABNORMAL HIGH (ref 4.0–10.5)
WBC: 32.6 10*3/uL — AB (ref 4.0–10.5)
WBC: 60.8 10*3/uL (ref 4.0–10.5)

## 2015-10-07 LAB — BASIC METABOLIC PANEL
Anion gap: 17 — ABNORMAL HIGH (ref 5–15)
Anion gap: 20 — ABNORMAL HIGH (ref 5–15)
BUN: 99 mg/dL — AB (ref 6–20)
BUN: 94 mg/dL — AB (ref 6–20)
CALCIUM: 6.3 mg/dL — AB (ref 8.9–10.3)
CHLORIDE: 108 mmol/L (ref 101–111)
CO2: 16 mmol/L — ABNORMAL LOW (ref 22–32)
CO2: 19 mmol/L — ABNORMAL LOW (ref 22–32)
CREATININE: 5.57 mg/dL — AB (ref 0.61–1.24)
Calcium: 6.6 mg/dL — ABNORMAL LOW (ref 8.9–10.3)
Chloride: 98 mmol/L — ABNORMAL LOW (ref 101–111)
Creatinine, Ser: 5.38 mg/dL — ABNORMAL HIGH (ref 0.61–1.24)
GFR calc Af Amer: 10 mL/min — ABNORMAL LOW (ref >60)
GFR calc Af Amer: 10 mL/min — ABNORMAL LOW (ref >60)
GFR, EST NON AFRICAN AMERICAN: 9 mL/min — AB (ref >60)
GFR, EST NON AFRICAN AMERICAN: 8 mL/min — AB (ref >60)
GLUCOSE: 93 mg/dL (ref 65–99)
GLUCOSE: 116 mg/dL — AB (ref 65–99)
POTASSIUM: 6.7 mmol/L — AB (ref 3.5–5.1)
Potassium: 6 mmol/L — ABNORMAL HIGH (ref 3.5–5.1)
SODIUM: 137 mmol/L (ref 135–145)
Sodium: 141 mmol/L (ref 135–145)

## 2015-10-07 LAB — LEGIONELLA PNEUMOPHILA SEROGP 1 UR AG: L. pneumophila Serogp 1 Ur Ag: NEGATIVE

## 2015-10-07 LAB — COMPREHENSIVE METABOLIC PANEL
ALBUMIN: 2.2 g/dL — AB (ref 3.5–5.0)
ALT: 49 U/L (ref 17–63)
ANION GAP: 19 — AB (ref 5–15)
AST: 51 U/L — AB (ref 15–41)
Alkaline Phosphatase: 77 U/L (ref 38–126)
BILIRUBIN TOTAL: 1.2 mg/dL (ref 0.3–1.2)
BUN: 93 mg/dL — ABNORMAL HIGH (ref 6–20)
CHLORIDE: 97 mmol/L — AB (ref 101–111)
CO2: 20 mmol/L — ABNORMAL LOW (ref 22–32)
Calcium: 5.9 mg/dL — CL (ref 8.9–10.3)
Creatinine, Ser: 5.42 mg/dL — ABNORMAL HIGH (ref 0.61–1.24)
GFR calc Af Amer: 10 mL/min — ABNORMAL LOW (ref >60)
GFR, EST NON AFRICAN AMERICAN: 9 mL/min — AB (ref >60)
Glucose, Bld: 135 mg/dL — ABNORMAL HIGH (ref 65–99)
POTASSIUM: 5 mmol/L (ref 3.5–5.1)
Sodium: 136 mmol/L (ref 135–145)
TOTAL PROTEIN: 5.2 g/dL — AB (ref 6.5–8.1)

## 2015-10-07 LAB — PHOSPHORUS: Phosphorus: 7.4 mg/dL — ABNORMAL HIGH (ref 2.5–4.6)

## 2015-10-07 LAB — LACTIC ACID, PLASMA: LACTIC ACID, VENOUS: 7 mmol/L — AB (ref 0.5–2.0)

## 2015-10-07 LAB — GLUCOSE, CAPILLARY
GLUCOSE-CAPILLARY: 133 mg/dL — AB (ref 65–99)
Glucose-Capillary: 104 mg/dL — ABNORMAL HIGH (ref 65–99)
Glucose-Capillary: 140 mg/dL — ABNORMAL HIGH (ref 65–99)

## 2015-10-07 LAB — PROTIME-INR
INR: 2.26 — AB (ref 0.00–1.49)
PROTHROMBIN TIME: 24.7 s — AB (ref 11.6–15.2)

## 2015-10-07 LAB — MAGNESIUM: MAGNESIUM: 2 mg/dL (ref 1.7–2.4)

## 2015-10-07 LAB — PROCALCITONIN: PROCALCITONIN: 1.48 ng/mL

## 2015-10-07 MED ORDER — SODIUM CHLORIDE 0.9 % IV SOLN
2.0000 g | Freq: Once | INTRAVENOUS | Status: AC
  Administered 2015-10-07: 2 g via INTRAVENOUS
  Filled 2015-10-07: qty 20

## 2015-10-10 LAB — CULTURE, BLOOD (ROUTINE X 2)
CULTURE: NO GROWTH
CULTURE: NO GROWTH

## 2015-10-20 ENCOUNTER — Ambulatory Visit: Payer: Medicare Other

## 2015-10-20 ENCOUNTER — Ambulatory Visit: Payer: Medicare Other | Admitting: Oncology

## 2015-10-20 ENCOUNTER — Other Ambulatory Visit: Payer: Medicare Other

## 2015-10-20 NOTE — Progress Notes (Signed)
CRITICAL VALUE ALERT  Critical value received:  Calcium 5.9  Date of notification:  10/17/2015  Time of notification:  0654  Critical value read back: Yes  Nurse who received alert:  Deboraha Sprang, RN [phone]  MD notified (1st page): Dr Oletta Darter  Time of first page:  [phone]

## 2015-10-20 NOTE — Progress Notes (Signed)
SLP Cancellation Note  Patient Details Name: Kyle Heath MRN: [id number] DOB: [birth year]   Cancelled treatment:       Reason Eval/Treat Not Completed: Medical issues which prohibited therapy. Pt persistently unarousable for swallow eval. Will sign off, please reorder if needed.    Halea Lieb, Katherene Ponto 10-14-15, 10:31 AM

## 2015-10-20 NOTE — Significant Event (Signed)
CRITICAL VALUE ALERT  Critical value received:  Calcium 6.3  Date of notification:  2015-10-24  Time of notification: 1003  Critical value read back:  Yes  Nurse who received alert: Lenor Coffin  MD notified:  Dr Halford Chessman  Time:  [id number]

## 2015-10-20 NOTE — Progress Notes (Signed)
PULMONARY / CRITICAL CARE MEDICINE   Name: Kyle Heath MRN: [id number] DOB: Mar 31, 1927    ADMISSION DATE:  10/19/2015  REFERRING MD :  EDP  CHIEF COMPLAINT:  SDH  INITIAL PRESENTATION: 81yo male with multiple medical problems including HTN, myeloproliferative disorder, AFib, combined heart failure, COPD, DM.  Pt with multiple recent admissions for acute on chronic heart failure, thrombocytopenia, AKI, failure to thrive.  Presented 11/16 with AMS after a fall at SNF.  Code stroke called initially and CT head revealed multiple acute SDH.  PCCM asked to admit to ICU.   STUDIES:  CT head 11/16>> moderate acute R SDH, 3 small L SDH, No midline shift.  CXR 11/17>> Stable diffuse bilateral lung opacities are noted concerning for edema or pneumonia, with associated pleural effusions, right greater than left. CXR 11/18>> Stable bilateral lung opacities and effusions   SIGNIFICANT EVENTS: 11/16>> Fall resulting with bilateral subdural hematomas non-operative per neurosurgery. 1 unit FFP and 1 unit platelet  SUBJECTIVE:  Still some disconnect between the family overnight, however patient ultimately remained DNR.  Lactic acid continues to increase as well as potassium. He's getting calcium gluconate, sodium bicarb  VITAL SIGNS: Temp:  [97 F (36.1 C)-97.4 F (36.3 C)] 97.4 F (36.3 C) (11/18 0345) Pulse Rate:  [25-87] 25 (11/18 0700) Resp:  [11-29] 13 (11/18 0700) BP: (71-115)/(37-74) 78/45 mmHg (11/18 0700) SpO2:  [84 %-100 %] 93 % (11/18 0700) Weight:  [166 lb 3.6 oz (75.4 kg)] 166 lb 3.6 oz (75.4 kg) (11/18 0435) HEMODYNAMICS:   VENTILATOR SETTINGS:   INTAKE / OUTPUT:  Intake/Output Summary (Last 24 hours) at 28-Oct-2015 0742 Last data filed at 28-Oct-2015 0600  Gross per 24 hour  Intake 4191.01 ml  Output     35 ml  Net 4156.01 ml    PHYSICAL EXAMINATION: General:  Frail, debilitated, chronically ill appearing male, lying in bed with irregular respirations    Neuro: Does not  open eyes, does not follow commands. Pupils equal round and mildly reactive to light HEENT:  Dry mucous membranes Cardiovascular: Bradycardia, regular rhythm, no m/r/g noted.  Lungs:  Irregular respirations. Transmitted upper airway sounds.  Abdomen:  +BS, soft, non-distended, non-tender Musculoskeletal:  Warm and dry, Trace bilateral LE edema    LABS:  CBC  Recent Labs Lab 10/06/15 0441 10/06/15 1035 Oct 28, 2015 0645  WBC 30.1* 32.6* PENDING  HGB 12.4* 12.9* 12.1*  HCT 47.1 47.6 44.7  PLT 11* 14* PENDING   Coag's  Recent Labs Lab 10/09/2015 1130 October 28, 2015 0530  APTT 47*  --   INR 1.82* 2.26*   BMET  Recent Labs Lab 10/06/15 1035 10/06/15 1705 10-28-2015 0530  NA 140 138 136  K 6.1* 7.4* 5.0  CL 105 104 97*  CO2 14* 15* 20*  BUN 98* 97* 93*  CREATININE 5.39* 5.25* 5.42*  GLUCOSE 156* 182* 135*   Electrolytes  Recent Labs Lab 10/06/15 0441 10/06/15 1035 10/06/15 1705 2015/10/28 0530  CALCIUM 6.6* 6.7* 6.3* 5.9*  MG 2.2  --   --  2.0  PHOS 6.6*  --   --  7.4*   Sepsis Markers  Recent Labs Lab 09/25/2015 1942 10/06/15 0441 10/06/15 1515 10/06/15 1705  LATICACIDVEN 4.8*  --  7.2* 8.4*  PROCALCITON 1.09 1.12  --   --    ABG No results for input(s): PHART, PCO2ART, PO2ART in the last 168 hours. Liver Enzymes  Recent Labs Lab 10/08/2015 1130 October 28, 2015 0530  AST 50* 51*  ALT 52 49  ALKPHOS 64 77  BILITOT 1.4* 1.2  ALBUMIN 2.5* 2.2*   Cardiac Enzymes No results for input(s): TROPONINI, PROBNP in the last 168 hours. Glucose  Recent Labs Lab 10/06/15 0808 10/06/15 1138 10/06/15 1543 10/06/15 1927 10/06/15 2341 2015-10-25 0352  GLUCAP 107* 148* 157* 170* 133* 104*    Imaging Dg Chest Port 1 View  2015-10-25  CLINICAL DATA:  Respiratory distress. EXAM: PORTABLE CHEST 1 VIEW COMPARISON:  October 06, 2015. FINDINGS: Stable cardiomegaly. No pneumothorax is noted. Stable bilateral lung opacities are noted concerning for edema or pneumonia with  associated pleural effusions, right greater than left. Bony thorax is unremarkable. IMPRESSION: Stable bilateral lung opacities and effusions as described above. Electronically Signed   By: Marijo Conception, M.D.   On: 25-Oct-2015 07:23     ASSESSMENT / PLAN:  NEUROLOGIC SDH - multiple acute - in setting of a fall Hx CVA  P:   Not surgical candidate per neurosurgery S/p 1 unit platelets and 1 unit FFP INR rising, at 2.26 this AM Neuro checks  Neuro following  Palliative consulted however no movement towards comfort care With patient's slowing HR and irregular breathing, concerns for [place] and worsening SDHs.   PULMONARY Hx COPD  Pleural effusion  At risk for airway compromise/aspiration P:   Supplemental O2 as needed (currently on RA however having irregular respirations) Xopenex PRN Patient is a DNR/DNI  CARDIOVASCULAR Combined CHF - EF 25% (oct 2016) Hx PAF - not on anticoagulation due to myeloproliferative disorder/thrombocytopenia  Hx HTN Hypotension on admission- likely from systolic CHF with possible septic shock component  Bradycardia  BNP elevated to 1073 (however unsure what his baseline is as this is lower than previous)  P:  Neo for pressure: requiring increased doses  Given bradycardia, could consider transitioning from neo to dopamine D5W with bicarb @ 125cc/hr with intermittent boluses for BPs and HR Holding home coreg    RENAL AKI  Hyperkalemia  Lactic acidosis- worsening (8.4)  Hypocalcemia  P:   Hydration with D5W with sodium bicarb  S/p Kayexalate, calcium gluconate, and insulin Continuing to receive calcium gluconate for hypocalcemia  Repeat BMET now and q6  Trend lactate: worsening  GASTROINTESTINAL Severe protein calorie malnutrition  P:   NPO  Consider placing NG tube for feeding if family continues to opt for aggressive measures  HEMATOLOGIC Myeloproliferative disorder (myelofibrosis) Thrombocytopenia - improved with  platelets, at baseline  Leukocytosis- worsening SDH  Coagulopathy- Worsening INR to 2.26 P:  S/p 1u FFP and 1 unit platelets Could consider another dose of FFP Fibrinogen low, platelet low (but stable) Trend CBC  Aranesp injections in the outpatient setting q2wks; holding for now  INFECTIOUS Recent HCAP Concerns for infection given leukocytosis and hypothermia  Lactic acid continuing to rise Leukocytosis continues to worsen, up to 60.8 Procalcintonin continues to rise, 1.12>1.48 P:   BCx2 11/16>>> NGTD  UC 11/16>>>  Vanc 11/16>>> ceftaz 11/16>>> Procalcitonin algorithm  Continue to trend lactic acid   ENDOCRINE DM Hypothyroid  P:   SSI  Synthroid - 1/2 dose IV   FAMILY  - Updates:   No family at the bedside this AM  - Palliative consulted, wish for aggressive measures but okay with DNR/DNI. I suspect this will be a hospital death regardless of family wishes.   Archie Patten, MD [facility] Family Medicine Resident  10-25-15, 7:42 AM

## 2015-10-20 NOTE — Progress Notes (Signed)
CRITICAL VALUE ALERT  Critical value received:  Lactic acid 7.0  Date of notification:  10/28/15  Time of notification:  10:20  Critical value read back: yes  Nurse who received alert:  Katrine Coho  MD notified (1st page):  N/A : consistent with previous labs and showed a decrease from previous 8.4  Time of first page:  N/A  MD notified (2nd page): N/A  Time of second page: N/A  Responding MD:  N/A  Time MD responded:  N/A

## 2015-10-20 NOTE — Progress Notes (Addendum)
Pt asystolic in ekg leads x 2; no apical heart tones; pupils fixed and dilated; no spontaneous respirations; findings verified with Katrine Coho, RN;  pt is DNR; Dr Titus Mould notified; pt pronounced at [facility]; family at bedside.  Lenor Coffin, RN

## 2015-10-20 NOTE — Discharge Summary (Signed)
Critical Care  Hospital Death  Summary  Patient name: Aadhavan Clyatt Medical record number: [id number] Date of birth: Nov 12, 1927 Age: 81 y.o. Gender: male Date of Admission: 10-08-2015  Date of Death: 10/10/15 Admitting Physician: Javier Glazier, MD  Primary Care Provider: Foye Spurling, MD  Indication for Hospitalization: subdural hematomas  Date/Time Death: Oct 10, 2015 at [facility] Course:  81yo male with multiple medical problems including HTN, myeloproliferative disorder, AFib, combined heart failure, COPD, DM who presented on 10-08-23 with AMS after a fall at SNF. Code stroke called initially and CT head revealed multiple acute SDH and his INR was noted to be 1.82. He was also noted to have acute kidney injury with a SCr to 5.57 and a significant metabolic acidosis.  PCCM asked to admit to ICU. He was placed on Vanc/ceftaz, neosynepherine  for pressure support, and a bicarb drip. He was given 1 unit FFP and 1 unit platelets for his coagulopathy.  Neurosurgery was unofficially consulted and felt he patient was not a surgical candidate. Neurology followed along as well. Given his poor prognosis, palliative was consulted. The patient was noted to be DNR/DNI Unfortunately he continued to decline requiring more vasopressors, his lactic acidosis continued to worsen, his mental status worsened, and his breathing became agonal. In the evening of 2015-10-10, his EKG was noted to be asystole, he had no heart tones, his pupils were fixed and dilated, and he did not have spontaneous respirations. He was pronounced dead at [facility].    Significant Labs and Imaging:   Recent Labs Lab 10/06/15 0441 10/06/15 1035 10-10-2015 0645  WBC 30.1* 32.6* 60.8*  HGB 12.4* 12.9* 12.1*  HCT 47.1 47.6 44.7  PLT 11* 14* 24*    Recent Labs Lab October 08, 2015 1130  10/06/15 0441 10/06/15 1035 10/06/15 1705 2015-10-10 0530 10-10-15 0855  NA 146*  < > 141 140 138 136 137  K 7.3*  < > 6.7* 6.1* 7.4* 5.0 6.0*   CL 111  < > 108 105 104 97* 98*  CO2 14*  < > 16* 14* 15* 20* 19*  GLUCOSE 92  < > 93 156* 182* 135* 116*  BUN 104*  < > 99* 98* 97* 93* 94*  CREATININE 5.76*  < > 5.38* 5.39* 5.25* 5.42* 5.57*  CALCIUM 6.9*  < > 6.6* 6.7* 6.3* 5.9* 6.3*  MG  --   --  2.2  --   --  2.0  --   PHOS  --   --  6.6*  --   --  7.4*  --   ALKPHOS 64  --   --   --   --  77  --   AST 50*  --   --   --   --  51*  --   ALT 52  --   --   --   --  49  --   ALBUMIN 2.5*  --   --   --   --  2.2*  --   < > = values in this interval not displayed.  CT head 11/16>> moderate acute R SDH, 3 small L SDH, No midline shift.  CXR 11/17>> Stable diffuse bilateral lung opacities are noted concerning for edema or pneumonia, with associated pleural effusions, right greater than left. CXR 11/18>> Stable bilateral lung opacities and effusions   Archie Patten, MD 10-10-2015, 7:54 PM PGY-2, [place]

## 2015-10-20 NOTE — Progress Notes (Signed)
[facility] Progress Note Patient Name: Kyle Heath DOB: November 29, 1926 MRN: [id number]   Date of Service  October 29, 2015  HPI/Events of Note  Ca++ = 5.9 and albumin * 2.2. Ca++ corrects to 7.34.  eICU Interventions  Will replete Ca++.     Intervention Category Intermediate Interventions: Electrolyte abnormality - evaluation and management  Randie Bloodgood [place] 2015/10/29, 6:55 AM

## 2015-10-20 NOTE — Progress Notes (Signed)
Pharmacist Heart Failure Core Measure Documentation  Assessment: Kyle Heath has an EF documented as 25-30% on 08/2015 by echo.  Rationale: Heart failure patients with left ventricular systolic dysfunction (LVSD) and an EF < 40% should be prescribed an angiotensin converting enzyme inhibitor (ACEI) or angiotensin receptor blocker (ARB) at discharge unless a contraindication is documented in the medical record.  This patient is not currently on an ACEI or ARB for HF.  This note is being placed in the record in order to provide documentation that a contraindication to the use of these agents is present for this encounter.  ACE Inhibitor or Angiotensin Receptor Blocker is contraindicated (specify all that apply)  []   ACEI allergy AND ARB allergy []   Angioedema []   Moderate or severe aortic stenosis []   Hyperkalemia []   Hypotension []   Renal artery stenosis [x]   Worsening renal function, preexisting renal disease or dysfunction   Joya San, PharmD Clinical Pharmacy Resident Pager # [phone] 11/06/15 9:00 AM

## 2015-10-20 NOTE — Progress Notes (Signed)
STROKE TEAM PROGRESS NOTE   SUBJECTIVE (INTERVAL HISTORY) There is 1 daughter at the bedside. She reports the family has talked to one girl from palliative care. She is leaving the final decision up to her other sisters. She feels it would be a shame to continue treating pt aggressively given current condition.   OBJECTIVE Temp:  [97 F (36.1 C)-97.7 F (36.5 C)] 97.7 F (36.5 C) (11/18 0812) Pulse Rate:  [25-87] 71 (11/18 0925) Cardiac Rhythm:  [-] Normal sinus rhythm (11/18 0715) Resp:  [11-29] 13 (11/18 0925) BP: (71-115)/(37-74) 114/57 mmHg (11/18 0925) SpO2:  [84 %-100 %] 96 % (11/18 0925) Weight:  [75.4 kg (166 lb 3.6 oz)] 75.4 kg (166 lb 3.6 oz) (11/18 0435)  CBC:  Recent Labs Lab 09/28/2015 1130  10/06/15 1035 October 10, 2015 0645  WBC 29.8*  < > 32.6* 60.8*  NEUTROABS 22.4*  --   --   --   HGB 14.2  < > 12.9* 12.1*  HCT 52.1*  < > 47.6 44.7  MCV 92.9  < > 92.1 92.0  PLT 19*  < > 14* 24*  < > = values in this interval not displayed.  Basic Metabolic Panel:   Recent Labs Lab 10/06/15 0441  10/06/15 1705 October 10, 2015 0530  NA 141  < > 138 136  K 6.7*  < > 7.4* 5.0  CL 108  < > 104 97*  CO2 16*  < > 15* 20*  GLUCOSE 93  < > 182* 135*  BUN 99*  < > 97* 93*  CREATININE 5.38*  < > 5.25* 5.42*  CALCIUM 6.6*  < > 6.3* 5.9*  MG 2.2  --   --  2.0  PHOS 6.6*  --   --  7.4*  < > = values in this interval not displayed. Urine Drug Screen:     Component Value Date/Time   LABOPIA NONE DETECTED 09/22/2015 1926   COCAINSCRNUR NONE DETECTED 10/02/2015 1926   LABBENZ NONE DETECTED 09/20/2015 1926   AMPHETMU NONE DETECTED 09/22/2015 1926   THCU NONE DETECTED 09/30/2015 1926   LABBARB NONE DETECTED 10/02/2015 1926      IMAGING  Ct Head Wo Contrast 10/12/2015  1. Moderate size acute RIGHT subdural hematoma. 2. Three small LEFT extra-axial hemorrhages also likely representing subdural hematomas. 3. No midline shift.  Basilar cisterns are patent.   Dg Chest Port 1  View 10-Oct-2015 Stable bilateral lung opacities and effusions  10/06/2015  Stable diffuse bilateral lung opacities are noted concerning for edema or pneumonia, with associated pleural effusions, right greater than left.   Dg Chest Portable 1 View 09/28/2015  Interval worsening of bilateral airspace opacities, likely representing pulmonary edema. Infection not excluded. The pre-existing bilateral pleural effusions have not significantly changed.    PHYSICAL EXAM Frail elderly male who is restless and in restraints. . Afebrile. Head is nontraumatic. Neck is supple without bruit.    Cardiac exam no murmur or gallop. Lungs are clear to auscultation. Distal pulses are well felt. Neurological Exam :  Drowsy opens eyes to stimulation but nor following commands consistently. Pupils irregular reactive.cannot test vision acuity, fieldsds or Funduscopic exam. Face symmetric. Not speaking. Moves all 4 limbs but left side less than right. Withdraws to pain left more than right. DTRs symmetric. Plantars downgoing bilaterally. Gait deferred.   ASSESSMENT/PLAN Kyle Heath is a 81 y.o. male with history of multiple medical problems presenting with altered mental status, hx fall at the nursing home.   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 Can consider resuming once hemorrhage has resolved.  Therapy recommendations:  pending   Disposition:  pending  (at [facility] PTA x 2 wk, went there after hospitalization for "fluid in his lungs") anticipate he will be even more dependent at time of discharge. Palliative care consideration suggested and recommended to family.  Baseline dementia  Present  x 3-4 years, could dress self and  do ADLs prior to initial   [place]  will sign off. Please call should any needs arise.  Hospital day # [place] for Pager information 2015/10/10 9:56 AM  I have personally examined this patient, reviewed notes, independently viewed imaging studies, participated in medical decision making and plan of care. I have made any additions or clarifications directly to the above note. Agree with note above. Stroke team will sign off. Kindly call for questions.  Antony Contras, MD Medical Director [facility] Pager: [phone] October 10, 2015 11:52 AM   To contact Stroke Continuity provider, please refer to [email or web address] After hours, contact General Neurology \

## 2015-10-20 DEATH — deceased

## 2016-03-29 IMAGING — CT CT HEAD W/O CM
2 series · 15 of 30 positions shown, 17 images · non-contrast
Comparison: CT 07/02/2012

CLINICAL DATA: altered mental status.

EXAM:
CT HEAD WITHOUT CONTRAST
TECHNIQUE: Contiguous axial images were obtained from the base of the skull
through the vertex without intravenous contrast.

[Series 2: head without · axial · non-contrast · 0.44mm/px · z∈[-204,-84]mm · 7 of 34 slices shown, 9 images]
[im 5/34  brain]
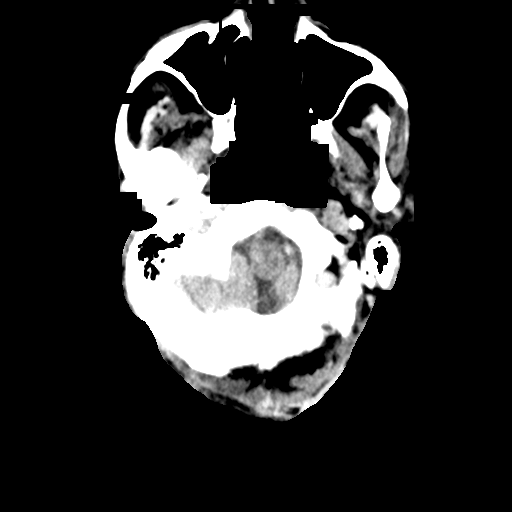
[im 5/34  bone]
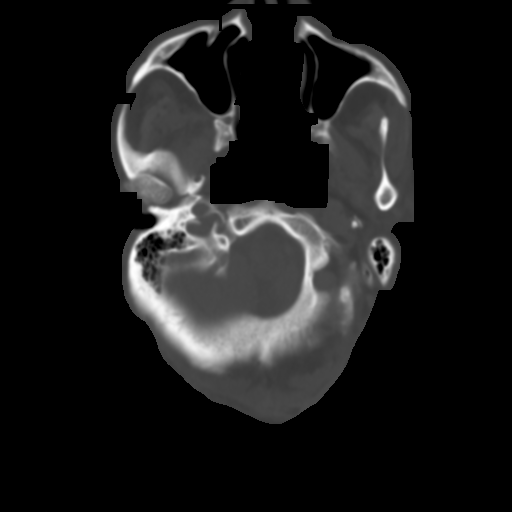
[im 9/34  brain]
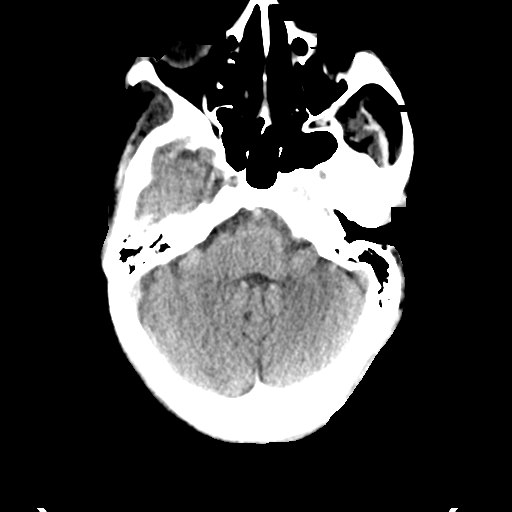
[im 13/34  brain]
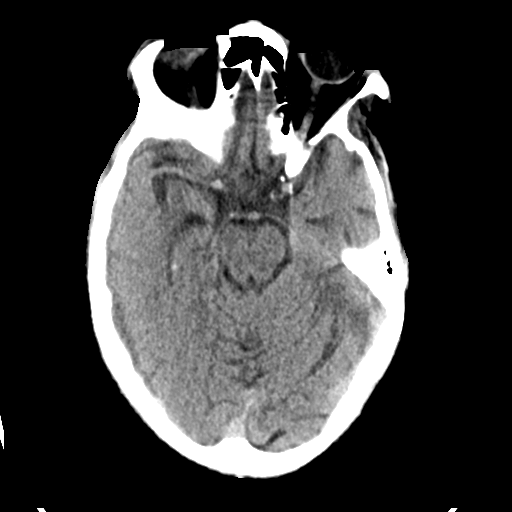
[im 17/34  brain]
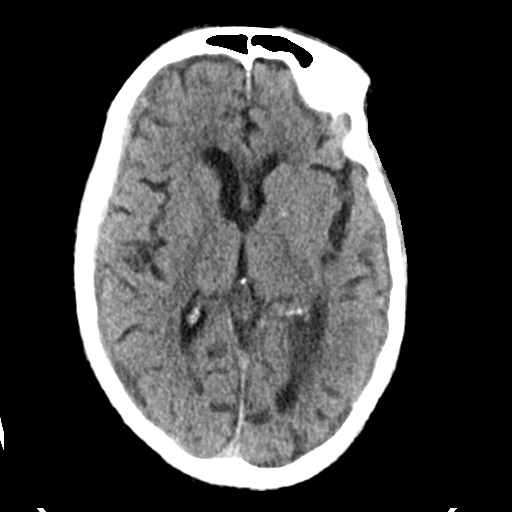
[im 21/34  brain]
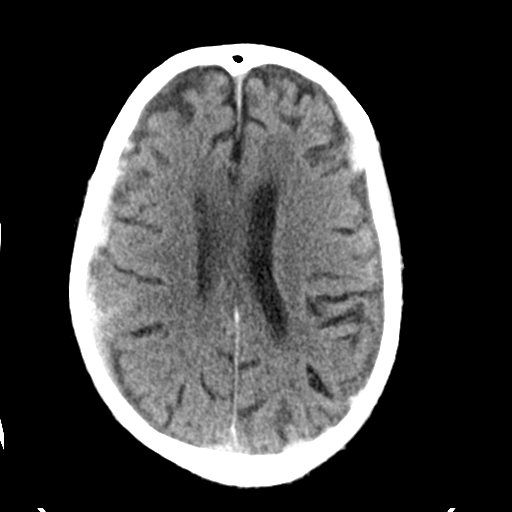
[im 21/34  bone]
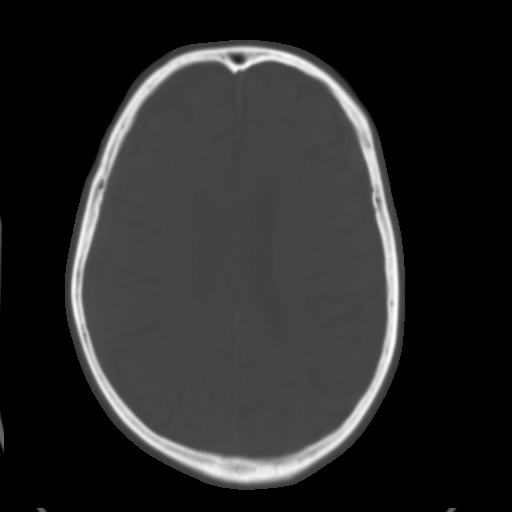
[im 25/34  brain]
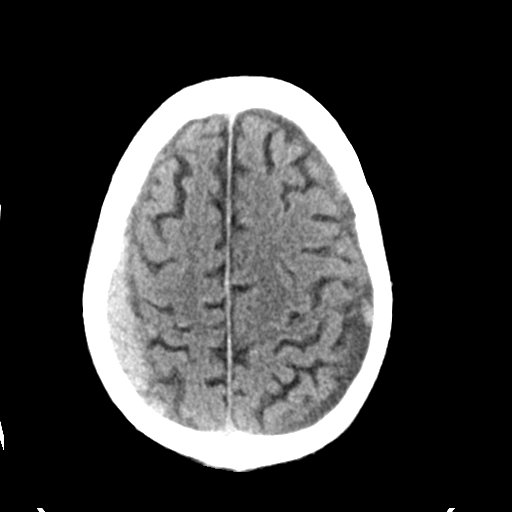
[im 29/34  brain]
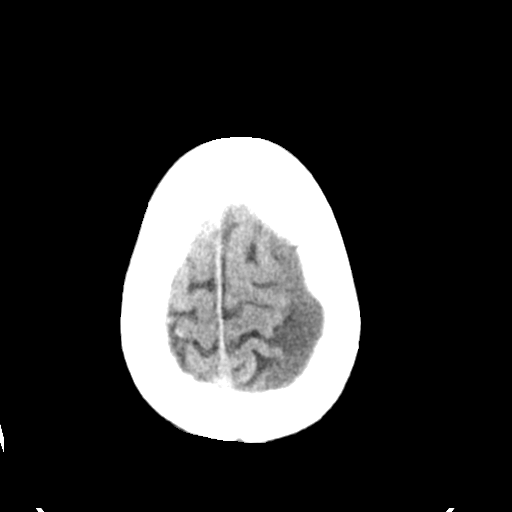

[Series 3: head bone · axial · 0.44mm/px · z∈[-208,-74]mm · 8 of 85 slices shown]
[im 9/85  bone]
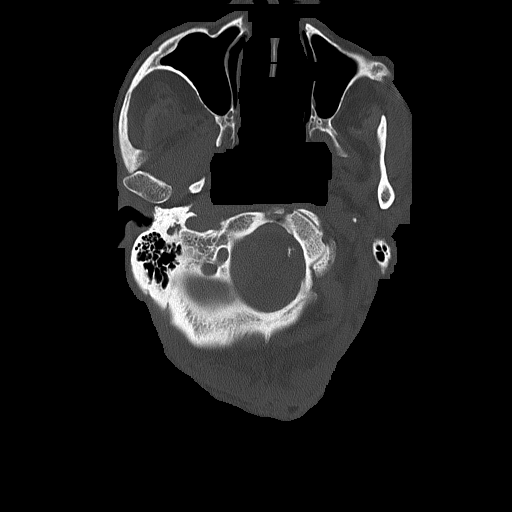
[im 17/85  bone]
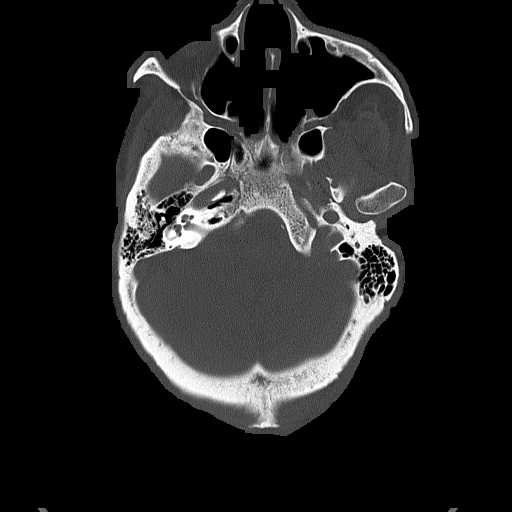
[im 26/85  bone]
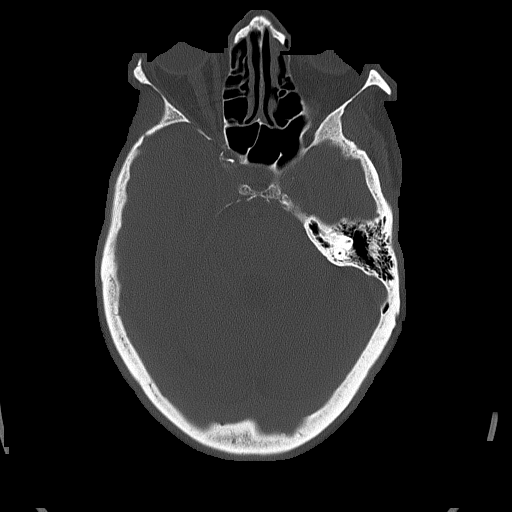
[im 38/85  bone]
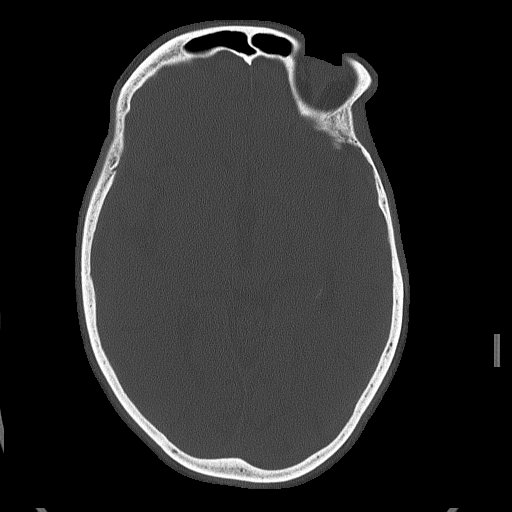
[im 47/85  bone]
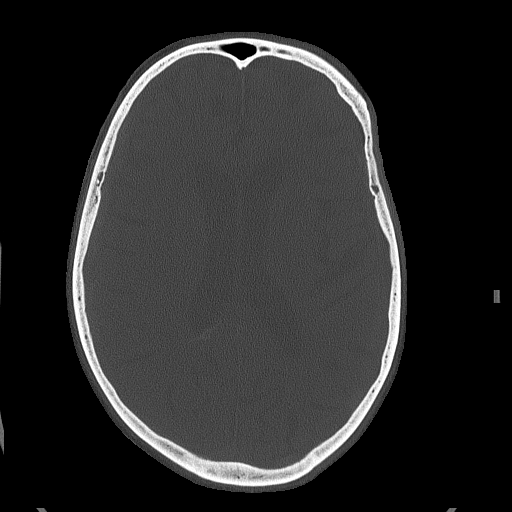
[im 59/85  bone]
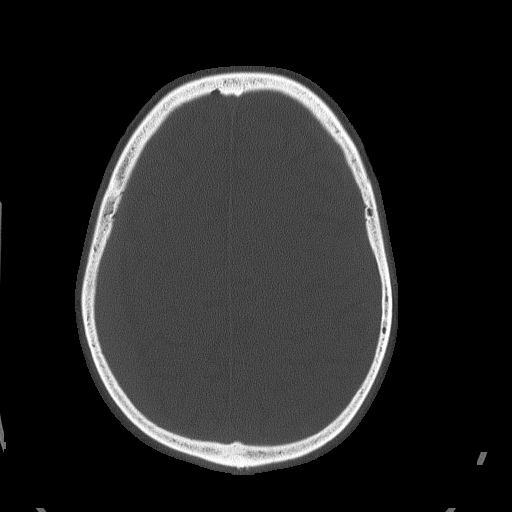
[im 68/85  bone]
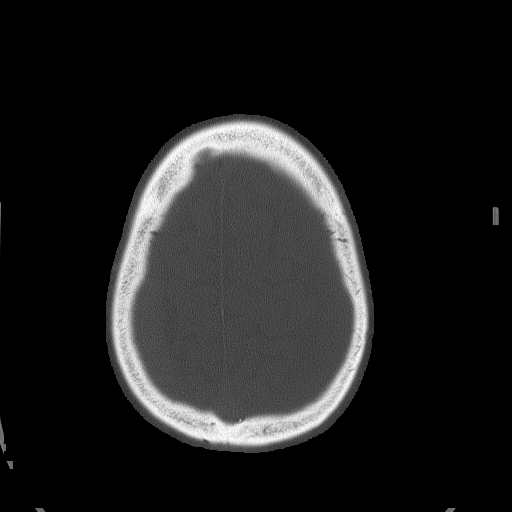
[im 76/85  bone]
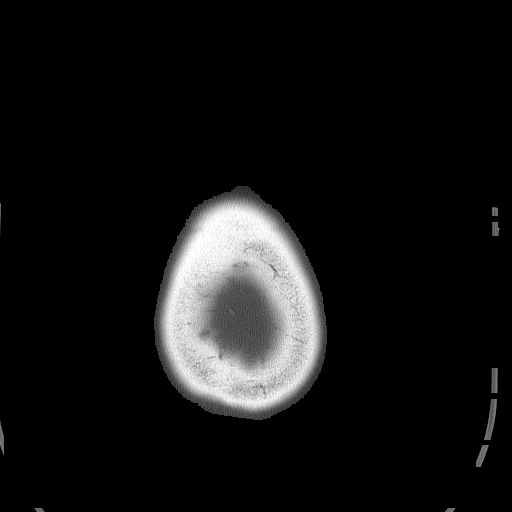

[15 of 30 positions shown; findings below may reference images not displayed]

FINDINGS: There is a high-density extra-axial fluid collection extending along
the high RIGHT cerebral convexity measuring 9 mm in depth (image 23,
series 2). No midline shift. No hydrocephalus. No parenchymal
hemorrhage. No intraventricular hemorrhage.

Second extra-axial fluid collection anterior to the LEFT frontal
lobe which is much smaller measuring 15 cm in length and 5 mm in
depth. Even smaller extra-axial collection posterior to the LEFT
parietal lobe measuring 5 mm. A third small extra-axial hemorrhage
adjacent to the LEFT temporal lobe on image 25, series 2.

There is generalized cortical atrophy. There is mild periventricular
white matter hypodensities. New

No evidence of skull fracture.  The basilar cisterns are patent

Paranasal sinuses and  mastoid air cells are clear.
IMPRESSION: 1. Moderate size acute RIGHT subdural hematoma.
2. Three small LEFT extra-axial hemorrhages also likely representing
subdural hematomas.
3. No midline shift.  Basilar cisterns are patent.
Critical Value/emergent results were called by telephone at the time
of interpretation on 10/05/2015 at [DATE] to Dr. Dr. Anneth, who
verbally acknowledged these results.

## 2016-03-30 IMAGING — CR DG CHEST 1V PORT
1 series · 1 of 1 positions shown · non-contrast
Comparison: October 05, 2015.

CLINICAL DATA: Shortness of breath, pleural effusion.

EXAM:
PORTABLE CHEST 1 VIEW

[AP]
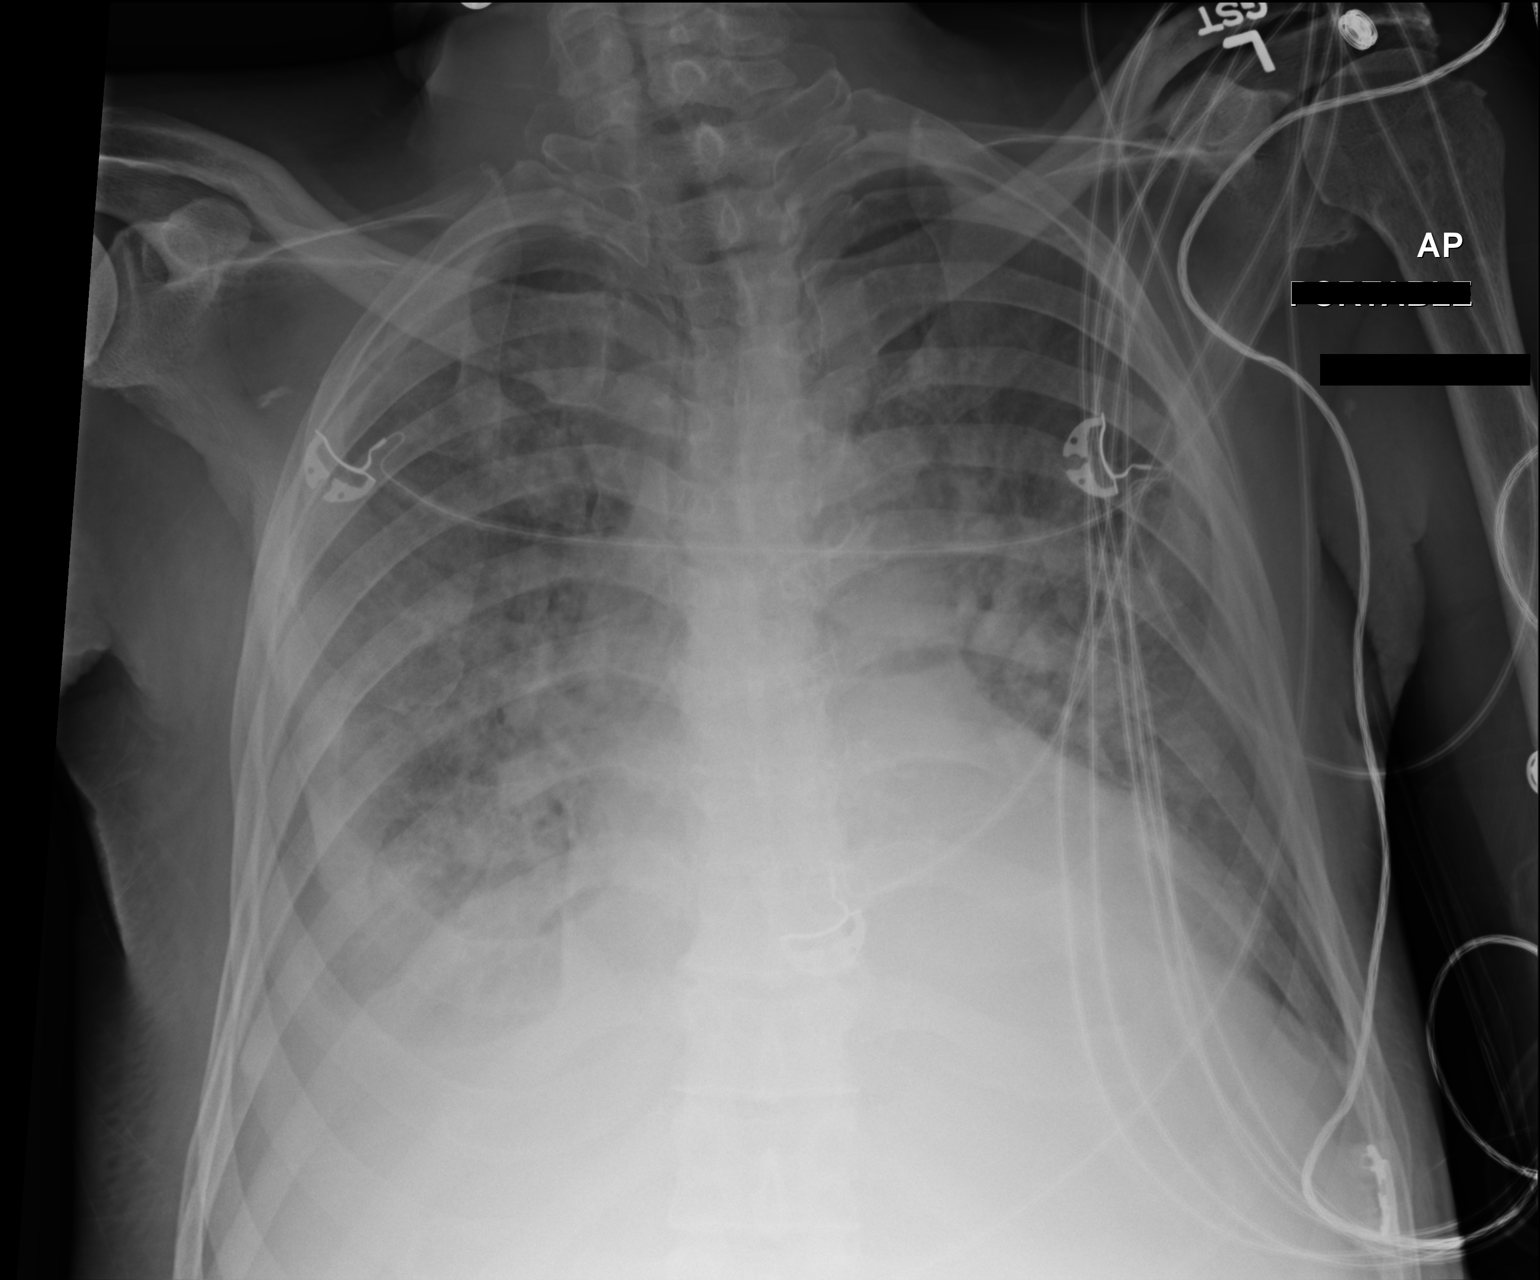

[1 of 1 positions shown; findings below may reference images not displayed]

FINDINGS: Stable cardiomegaly. Stable diffuse bilateral lung opacities are
noted concerning for edema or pneumonia. Stable bilateral pleural
effusions are noted with right greater than left. No pneumothorax is
noted. Bony thorax is unremarkable.
IMPRESSION: Stable diffuse bilateral lung opacities are noted concerning for
edema or pneumonia, with associated pleural effusions, right greater
than left.

## 2016-03-31 IMAGING — CR DG CHEST 1V PORT
1 series · 1 of 1 positions shown · non-contrast
Comparison: October 06, 2015.

CLINICAL DATA: Respiratory distress.

EXAM:
PORTABLE CHEST 1 VIEW

[AP]
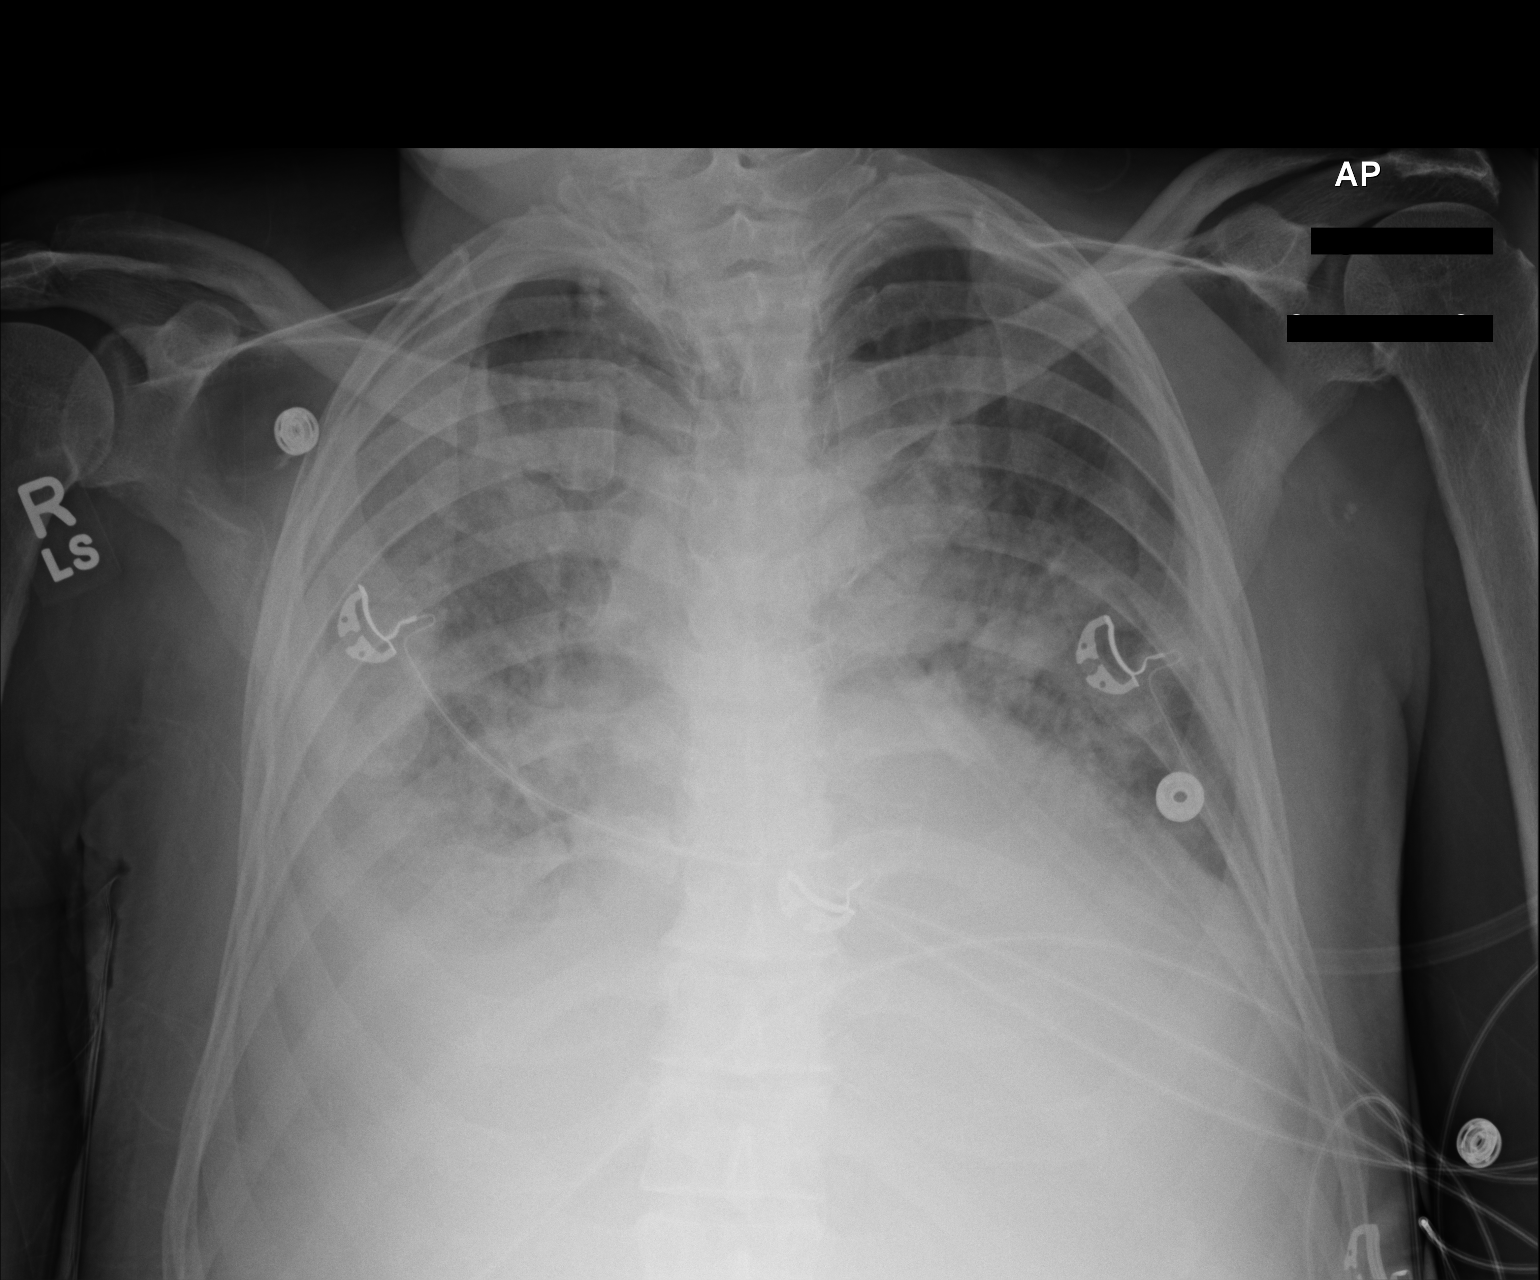

[1 of 1 positions shown; findings below may reference images not displayed]

FINDINGS: Stable cardiomegaly. No pneumothorax is noted. Stable bilateral lung
opacities are noted concerning for edema or pneumonia with
associated pleural effusions, right greater than left. Bony thorax
is unremarkable.
IMPRESSION: Stable bilateral lung opacities and effusions as described above.

## 2016-04-28 ENCOUNTER — Other Ambulatory Visit: Payer: Self-pay | Admitting: Nurse Practitioner
